# Patient Record
Sex: Male | Born: 1935 | Race: White | Hispanic: No | Marital: Married | Smoking: Former smoker
Health system: Southern US, Community
[De-identification: ages and names within clinical notes are randomized; demographics above are authoritative.]

## PROBLEM LIST (undated history)

## (undated) DIAGNOSIS — H9190 Unspecified hearing loss, unspecified ear: Secondary | ICD-10-CM

## (undated) DIAGNOSIS — I10 Essential (primary) hypertension: Secondary | ICD-10-CM

## (undated) DIAGNOSIS — I2510 Atherosclerotic heart disease of native coronary artery without angina pectoris: Secondary | ICD-10-CM

## (undated) DIAGNOSIS — J45909 Unspecified asthma, uncomplicated: Secondary | ICD-10-CM

## (undated) DIAGNOSIS — M1990 Unspecified osteoarthritis, unspecified site: Secondary | ICD-10-CM

## (undated) DIAGNOSIS — C4490 Unspecified malignant neoplasm of skin, unspecified: Secondary | ICD-10-CM

## (undated) DIAGNOSIS — E7800 Pure hypercholesterolemia, unspecified: Secondary | ICD-10-CM

## (undated) DIAGNOSIS — R079 Chest pain, unspecified: Secondary | ICD-10-CM

## (undated) DIAGNOSIS — R200 Anesthesia of skin: Secondary | ICD-10-CM

## (undated) DIAGNOSIS — Z79818 Long term (current) use of other agents affecting estrogen receptors and estrogen levels: Secondary | ICD-10-CM

## (undated) DIAGNOSIS — C61 Malignant neoplasm of prostate: Secondary | ICD-10-CM

## (undated) DIAGNOSIS — R413 Other amnesia: Secondary | ICD-10-CM

## (undated) DIAGNOSIS — R32 Unspecified urinary incontinence: Secondary | ICD-10-CM

## (undated) DIAGNOSIS — Z923 Personal history of irradiation: Secondary | ICD-10-CM

## (undated) HISTORY — PX: HERNIA REPAIR: SHX51

## (undated) HISTORY — PX: SKIN CANCER EXCISION: SHX779

## (undated) HISTORY — PX: COLONOSCOPY: SHX174

## (undated) HISTORY — DX: Chest pain, unspecified: R07.9

## (undated) HISTORY — PX: PENILE PROSTHESIS IMPLANT: SHX240

## (undated) HISTORY — PX: TONSILLECTOMY: SUR1361

## (undated) HISTORY — PX: PROSTATECTOMY: SHX69

## (undated) HISTORY — PX: THYROID SURGERY: SHX805

## (undated) HISTORY — PX: SHOULDER ARTHROSCOPY W/ ROTATOR CUFF REPAIR: SHX2400

---

## 2004-09-24 ENCOUNTER — Emergency Department (HOSPITAL_COMMUNITY): Admission: EM | Admit: 2004-09-24 | Discharge: 2004-09-25 | Payer: Self-pay | Admitting: Emergency Medicine

## 2006-04-30 ENCOUNTER — Encounter: Admission: RE | Admit: 2006-04-30 | Discharge: 2006-04-30 | Payer: Self-pay | Admitting: Family Medicine

## 2006-08-10 ENCOUNTER — Emergency Department (HOSPITAL_COMMUNITY): Admission: EM | Admit: 2006-08-10 | Discharge: 2006-08-10 | Payer: Self-pay | Admitting: Emergency Medicine

## 2007-03-17 ENCOUNTER — Ambulatory Visit (HOSPITAL_BASED_OUTPATIENT_CLINIC_OR_DEPARTMENT_OTHER): Admission: RE | Admit: 2007-03-17 | Discharge: 2007-03-18 | Payer: Self-pay | Admitting: Urology

## 2007-04-27 ENCOUNTER — Emergency Department (HOSPITAL_COMMUNITY): Admission: EM | Admit: 2007-04-27 | Discharge: 2007-04-27 | Payer: Self-pay | Admitting: Emergency Medicine

## 2007-05-15 ENCOUNTER — Emergency Department (HOSPITAL_COMMUNITY): Admission: EM | Admit: 2007-05-15 | Discharge: 2007-05-15 | Payer: Self-pay | Admitting: Emergency Medicine

## 2007-05-17 ENCOUNTER — Encounter: Admission: RE | Admit: 2007-05-17 | Discharge: 2007-05-17 | Payer: Self-pay | Admitting: Orthopedic Surgery

## 2007-08-02 ENCOUNTER — Ambulatory Visit: Payer: Self-pay | Admitting: Internal Medicine

## 2007-08-02 DIAGNOSIS — Z860100 Personal history of colon polyps, unspecified: Secondary | ICD-10-CM | POA: Insufficient documentation

## 2007-08-02 DIAGNOSIS — R159 Full incontinence of feces: Secondary | ICD-10-CM | POA: Insufficient documentation

## 2007-08-02 DIAGNOSIS — K644 Residual hemorrhoidal skin tags: Secondary | ICD-10-CM | POA: Insufficient documentation

## 2007-08-03 ENCOUNTER — Ambulatory Visit: Payer: Self-pay | Admitting: Internal Medicine

## 2007-08-03 ENCOUNTER — Telehealth: Payer: Self-pay | Admitting: Internal Medicine

## 2007-08-04 ENCOUNTER — Telehealth: Payer: Self-pay | Admitting: Internal Medicine

## 2007-08-06 ENCOUNTER — Ambulatory Visit: Payer: Self-pay | Admitting: Internal Medicine

## 2007-08-06 ENCOUNTER — Encounter: Payer: Self-pay | Admitting: Internal Medicine

## 2007-08-09 ENCOUNTER — Encounter: Payer: Self-pay | Admitting: Internal Medicine

## 2008-08-25 ENCOUNTER — Ambulatory Visit: Payer: Self-pay | Admitting: Cardiology

## 2008-08-25 ENCOUNTER — Inpatient Hospital Stay (HOSPITAL_COMMUNITY): Admission: EM | Admit: 2008-08-25 | Discharge: 2008-08-26 | Payer: Self-pay | Admitting: Emergency Medicine

## 2008-08-25 ENCOUNTER — Encounter (INDEPENDENT_AMBULATORY_CARE_PROVIDER_SITE_OTHER): Payer: Self-pay | Admitting: Internal Medicine

## 2008-11-17 ENCOUNTER — Encounter: Admission: RE | Admit: 2008-11-17 | Discharge: 2008-11-17 | Payer: Self-pay | Admitting: Family Medicine

## 2009-01-05 ENCOUNTER — Encounter: Admission: RE | Admit: 2009-01-05 | Discharge: 2009-01-05 | Payer: Self-pay | Admitting: Neurology

## 2010-06-02 LAB — HEMOGLOBIN A1C: Hgb A1c MFr Bld: 5.4 % (ref 4.6–6.1)

## 2010-06-02 LAB — LIPID PANEL
LDL Cholesterol: 40 mg/dL (ref 0–99)
Total CHOL/HDL Ratio: 4 RATIO
Triglycerides: 284 mg/dL — ABNORMAL HIGH (ref <150)
VLDL: 57 mg/dL — ABNORMAL HIGH (ref 0–40)

## 2010-06-02 LAB — COMPREHENSIVE METABOLIC PANEL
ALT: 21 U/L (ref 0–53)
AST: 26 U/L (ref 0–37)
Albumin: 3.6 g/dL (ref 3.5–5.2)
CO2: 26 mEq/L (ref 19–32)
Chloride: 106 mEq/L (ref 96–112)
GFR calc Af Amer: >60 mL/min (ref >60)
GFR calc non Af Amer: 58 mL/min — ABNORMAL LOW (ref >60)
Sodium: 139 mEq/L (ref 135–145)
Total Bilirubin: 0.8 mg/dL (ref 0.3–1.2)

## 2010-06-02 LAB — CBC
HCT: 35.9 % — ABNORMAL LOW (ref 39.0–52.0)
HCT: 38.6 % — ABNORMAL LOW (ref 39.0–52.0)
Hemoglobin: 12.3 g/dL — ABNORMAL LOW (ref 13.0–17.0)
MCHC: 33.8 g/dL (ref 30.0–36.0)
MCV: 92.6 fL (ref 78.0–100.0)
Platelets: 218 10*3/uL (ref 150–400)
RBC: 3.88 MIL/uL — ABNORMAL LOW (ref 4.22–5.81)
RDW: 13.4 % (ref 11.5–15.5)
WBC: 5.4 10*3/uL (ref 4.0–10.5)

## 2010-06-02 LAB — DIFFERENTIAL
Basophils Absolute: 0 10*3/uL (ref 0.0–0.1)
Basophils Absolute: 0.1 10*3/uL (ref 0.0–0.1)
Basophils Relative: 1 % (ref 0–1)
Eosinophils Absolute: 0.4 10*3/uL (ref 0.0–0.7)
Eosinophils Absolute: 0.4 10*3/uL (ref 0.0–0.7)
Eosinophils Relative: 6 % — ABNORMAL HIGH (ref 0–5)
Eosinophils Relative: 7 % — ABNORMAL HIGH (ref 0–5)
Lymphocytes Relative: 26 % (ref 12–46)
Lymphocytes Relative: 27 % (ref 12–46)
Lymphs Abs: 1.4 10*3/uL (ref 0.7–4.0)
Monocytes Absolute: 0.6 10*3/uL (ref 0.1–1.0)

## 2010-06-02 LAB — CK TOTAL AND CKMB (NOT AT ARMC): Relative Index: 1.9 (ref 0.0–2.5)

## 2010-06-02 LAB — POCT I-STAT, CHEM 8
BUN: 27 mg/dL — ABNORMAL HIGH (ref 6–23)
Calcium, Ion: 1.17 mmol/L (ref 1.12–1.32)
Creatinine, Ser: 1.2 mg/dL (ref 0.4–1.5)
Glucose, Bld: 98 mg/dL (ref 70–99)
TCO2: 25 mmol/L (ref 0–100)

## 2010-06-02 LAB — D-DIMER, QUANTITATIVE: D-Dimer, Quant: 0.4 ug/mL-FEU (ref 0.00–0.48)

## 2010-06-02 LAB — CARDIAC PANEL(CRET KIN+CKTOT+MB+TROPI)
CK, MB: 3 ng/mL (ref 0.3–4.0)
Relative Index: 1.9 (ref 0.0–2.5)

## 2010-06-02 LAB — APTT: aPTT: 34 seconds (ref 24–37)

## 2010-06-02 LAB — POCT CARDIAC MARKERS
Troponin i, poc: <0.05 ng/mL (ref 0.00–0.09)
Troponin i, poc: <0.05 ng/mL (ref 0.00–0.09)

## 2010-06-02 LAB — TROPONIN I: Troponin I: 0.02 ng/mL (ref 0.00–0.06)

## 2010-07-09 NOTE — H&P (Signed)
NAME:  Andre Casey, Andre Casey          ACCOUNT NO.:  [id number]   MEDICAL RECORD NO.:  [id number]          PATIENT TYPE:  EMS   LOCATION:  ED                           FACILITY[facility]  [facility]   PHYSICIAN:  Michiel Cowboy, MDDATE OF BIRTH:  August 08, 1929   DATE OF ADMISSION:  08/25/2008  DATE OF DISCHARGE:                              HISTORY & PHYSICAL   PRIMARY CARE Damaya Channing:  Donia Guiles, M.D.   CHIEF COMPLAINT:  Chest pain.   HISTORY OF PRESENT ILLNESS:  The patient is a 77 year old gentleman with  history of hypertension, hyperlipidemia who has had intermittent chest  pains for years but today he was sitting at his computer working around  9:00 p.m. when he developed a sharp substernal chest pain, nonradiating,  not associated shortness of breath, diaphoresis or nausea.  This lasted  for about 115 minutes and went away.  He would not worry too much about  it since he had similar pains before though they did not last that long.  The patient's wife was concerned, brought him into emergency department.  EKG showed sinus bradycardia, left anterior fascicular block.  I  unfortunately did not have an old EKG on him.  [facility] hospitalist was  called to admit for chest pain rule out.   REVIEW OF SYSTEMS:  Otherwise, unremarkable.  The patient exercises and  gardens actively.  Never has shortness of breath or chest pain with  that.  He had a stress test done 4 years ago which was unremarkable for  him and overall has been doing fairly well.  Review of systems, otherwise negative.   PAST MEDICAL HISTORY:  1. Hyperlipidemia.  2. Hypertension.  3. Prostate cancer status post radiation.  4. Hormonal therapy.  5. Chronic bradycardia.   MEDICATIONS:  1. Lisinopril/hydrochlorothiazide, dose unknown.  2. Amlodipine 2.5 mg daily.  3. Simvastatin 20 mg daily.  4. Zetia 10 mg daily.  5. Oxybutynin 10 mg daily.  6. Citalopram 20 mg daily.  7. Multivitamin.  8. Calcium.  9. Glucosamine.  10.Potassium 90 mg daily.  11.Aspirin 81 mg daily.  12.Fish oil daily.  13.Folic acid daily.  14.Biotin daily.  15.Zinc sulfite.  16.Quinine sulfate as needed.   ALLERGIES:  NO KNOWN DRUG ALLERGIES.   PHYSICAL EXAMINATION:  VITALS:  Temperature 97.2, blood pressure 96/61  now up to 107/60, pulses 52, respirations 18, satting 98% on room air.  GENERAL:  The patient appears to be in no acute distress.  HEAD:  Nontraumatic.  Moist mucous membranes.  LUNGS:  Clear to auscultation bilaterally.  HEART:  Regular rate and rhythm.  Somewhat slow though.  ABDOMEN:  Soft, nontender, nondistended.  LOWER EXTREMITIES:  Without clubbing, cyanosis or edema.  NEUROLOGIC:  Intact.  SKIN:  Dry and intact.   LABORATORY DATA:  White blood cell count 7.1, hemoglobin 11.9, sodium  140, potassium 3.4, creatinine 1.2.  Cardiac enzymes negative.  EKG  shows sinus brady with the left anterior fascicular block.  No old EKG  available.  Chest x-ray unremarkable.   ASSESSMENT/PLAN:  1. This is a 77 year old gentleman with past medical history  significant for prostate cancer, hypertension, hyperlipidemia      presents with typical chest pain.  2. Chest pain very typical, but given advanced age and risk factors,      we will cycle cardiac enzymes, admit, place on tele.  Relay for      risk factors, check fasting lipid panel, hemoglobin A1c, given      bradycardia.  Will check TSH.  Given left anterior fascicular      block, will check a 2-D echo to evaluate for any valvular      abnormalities or any wall motion abnormalities.  The patient may      need benefit from repeat stress testing as an outpatient.  The      patient does not describe any pleuritic component or shortness of      breath, but given history of cancer, and somewhat soft and low      blood pressure, to be complete will order a D-dimer, although  PE      is less likely.  3. Hypertension.  Currently under excellent control, actually  somewhat      low, will hold BP medications for now until he is further observed.  4. Hyperlipidemia.  Continue home medications.  5. Slightly low potassium, replace.  6. Prophylaxis Protonix plus Lovenox.      Michiel Cowboy, MD  Electronically Signed     AVD/MEDQ  D:  08/25/2008  T:  08/25/2008  Job:  [id number]   cc:   Donia Guiles, M.D.  Fax: [phone]

## 2010-07-09 NOTE — Op Note (Signed)
NAME:  GARL, SPEIGNER          ACCOUNT NO.:  [id number]   MEDICAL RECORD NO.:  [id number]          PATIENT TYPE:  AMB   LOCATION:  [facility]                         FACILITY:  [facility]   PHYSICIAN:  Ronald L. Earlene Plater, M.D.  DATE OF BIRTH:  1929-07-05   DATE OF PROCEDURE:  03/17/2007  DATE OF DISCHARGE:                               OPERATIVE REPORT   PREOPERATIVE DIAGNOSIS:  Malfunction of previously placed penile  prosthesis.   POSTOPERATIVE DIAGNOSIS:    DICTATION ENDED AT THIS POINT      Duane Boston, MD      Lucrezia Starch. Earlene Plater, M.D.  Electronically Signed    BP/MEDQ  D:  03/17/2007  T:  03/17/2007  Job:  [id number]

## 2010-07-09 NOTE — Op Note (Signed)
NAME:  Andre Casey, Andre Casey          ACCOUNT NO.:  [id number]   MEDICAL RECORD NO.:  [id number]          PATIENT TYPE:  AMB   LOCATION:  [facility]                         FACILITY:  [facility]   PHYSICIAN:  Ronald L. Earlene Plater, M.D.  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   PROCEDURE PERFORMED:  Removal of Mentor Mark II inflatable penile  prosthesis, replacement with Mentor OTR inflatable penile prosthesis.  Urethral dilatation with filiform dilators.   ANESTHESIA:  General.   DRAINS:  14 French Foley catheter.   INDICATIONS FOR PROCEDURE:  The patient is a 77 year old male with  history of cancer of the prostate, status post retropubic prostatectomy  in September, 1992 and subsequent radiotherapy.  He has a history of  erectile dysfunction and had Mark II Mentor inflatable penile prosthesis  placement many years ago in [place].  However, recently he had  some issues with the prosthesis and was seen by Dr. Earlene Plater in the clinic  at which point it was found that there was malfunction with the device.  The patient was scheduled for surgery today which involves removal of  the old penile prosthesis and placement of a Mentor OTR inflatable  penile prosthesis.   DESCRIPTION OF PROCEDURE:  The patient was brought to the operating  room.  The patient was placed in the supine position.  The patient was  administered general anesthesia by the anesthesia team.  Proper time-out  was performed.  The patient was given appropriate preoperative  antibiotic.  The patient was subsequently placed in the dorsal lithotomy  position.  The patient's surgery site was then shaved using a clipper.  The patient's  scrotum and infraumbilical abdomen was shaved using the  clipper.  The patient subsequently underwent a ten minute scrub.  The  scrub area involved infraumbilical, scrotum and perineal area.  The  patient was subsequently painted and draped in the usual sterile manner.  We then attempted to place a 16 [place] regular Foley catheter in the  patient's bladder, however experienced some resistance right around the  bladder area.  We attempted to place a smaller diameter catheter; this  time we tried to place a 14 French Foley catheter, however had some  difficulty with that also.  We subsequently used filiform dilators to  dilate the patient from 70 to 96 [place].  Subsequently we were able to  place a 14 [place] regular Foley catheter with efflux of clear urine.  We  then made a longitudinal hemiscrotal incision along the median raphe of  the scrotum.  We dissected down to the subcutaneous tissue to the area  overlying the pump.  We were able to isolate the tubes going from the  pump to the penile prosthesis.  They were isolate using the Allis tissue  and using the Bovie cautery the subcutaneous tissue was excised.  We  were  subsequently able to retrieve the pump from the pseudocapsule.  We  then followed the tubes on both sides down to the penile prosthesis on  both sides, again using the Bovie electrode we incised the subcutaneous  tissue and capsule covering the proximal part of the penile prosthesis.  After making the small openings over the penile prosthesis we were able  to remove the prosthesis and rear tip extenders on both sides.  Once the  old inflatable penile prosthesis and its components were removed, we  irrigated the scrotum and the carpal bodies with irrigant solution.  We  subsequently measured the old dilations into the carpal bodies and they  were found to be measuring 7 cm proximally bilaterally and 12 cm  distally bilaterally.  Decision was made to use size 16 Mentor ODR  with  a 3 cm rear tip extenders bilaterally.  At that point we paid attention  to the infraumbilical area and made a 4 cm longitudinal incision in the  midline in the infraumbilical area.  This was dissected down to the  rectus fascia.  A small opening was made in the rectus fascia and with  blunt dissection space was created on the right side along the space of  Retzius.  After securing hemostasis the 60 cc reservoir was placed into  the space of Retzius.  The tubing was brought out and the rectus fascia  was repaired with 3-0 PDS suture.  We then again paid attention to the  scrotal area.  At this point we placed the new prosthesis bilaterally  into the carpal bodies using the sutures at the tip of the prosthesis  introducer.  They were bilaterally fitted with 3 cm rear tip extenders  and the prosthesis was placed distally and proximally.  Of note, after  removal of the old prosthesis, we placed 3-0 PDS sutures as stay sutures  into the openings over the carpal bodies.  We then paid attention to the  placement of the pump into the scrotum.  The patient already had a  pseudocapsule from the old pump and the pump was placed into the old  capsule.  Of note, after placement of the penile prosthesis and  placement of the pump, we had checked the whole system after connecting  the pump to a 60 cc syringe and it seemed to be working fine.  The pump  and the release valve were secured in place by suturing the layer of  scrotum with the pseudocapsule so that the release valve lies  horizontally.  We then closed the carpal openings using 3-0 PDS suture  and the stay sutures bilaterally.  A subcutaneous tunnel was then  created between the scrotum and the right side infraumbilically and the  tubing from the pump was taken up into the infraumbilical incision and  connection was made with the reservoir.  We again tested the system and  it was functioning fine.  At that point we closed the dartos  muscle and  fascia with 3-0 chromic suture, closing the dead space.  We also closed  the subcutaneous tissue with the infraumbilical incision using 3-0  chromic suture.  We then used staples and closed the infraumbilical  incision.  We then used 4-0 nylon suture and applied subcuticular stitch  across the scrotal incision.  We then applied collodium over the scrotal  incision and tied the nylon suture loosely.  The incision was then  dressed with 4x4 gauze and Kerlix fluff ball.  The catheter  was then  connected to a drainage bag and put to straight drain after securing the  penis against the infraumbilical abdomen.  The patient was subsequently  transferred in stable condition to the recovery room.  Of note, Dr.  Earlene Plater was present during all aspects of the case.     ______________________________  Orvilla Cornwall, Urology Resident      Lucrezia Starch. Earlene Plater, M.D.  Electronically Signed    JJ/MEDQ  D:  03/17/2007  T:  03/17/2007  Job:  [id number]

## 2010-11-14 LAB — COMPREHENSIVE METABOLIC PANEL
Albumin: 3.8
Alkaline Phosphatase: 57
BUN: 18
Chloride: 102
Glucose, Bld: 116 — ABNORMAL HIGH
Potassium: 3.6
Total Bilirubin: 1.2

## 2010-11-14 LAB — CBC
HCT: 40.7
Hemoglobin: 14.2
WBC: 8.3

## 2010-11-14 LAB — PROTIME-INR
INR: 0.9
Prothrombin Time: 12.6

## 2010-11-18 LAB — URINALYSIS, ROUTINE W REFLEX MICROSCOPIC
Bilirubin Urine: NEGATIVE
Glucose, UA: NEGATIVE
Hgb urine dipstick: NEGATIVE
Specific Gravity, Urine: 1.015
Urobilinogen, UA: 0.2

## 2010-11-18 LAB — URINE CULTURE: Culture: NO GROWTH

## 2011-01-31 DIAGNOSIS — N393 Stress incontinence (female) (male): Secondary | ICD-10-CM | POA: Insufficient documentation

## 2011-02-03 DIAGNOSIS — N529 Male erectile dysfunction, unspecified: Secondary | ICD-10-CM | POA: Insufficient documentation

## 2011-02-27 DIAGNOSIS — I498 Other specified cardiac arrhythmias: Secondary | ICD-10-CM | POA: Diagnosis not present

## 2011-03-24 DIAGNOSIS — E7800 Pure hypercholesterolemia, unspecified: Secondary | ICD-10-CM | POA: Diagnosis not present

## 2011-03-24 DIAGNOSIS — I10 Essential (primary) hypertension: Secondary | ICD-10-CM | POA: Diagnosis not present

## 2011-03-24 DIAGNOSIS — N393 Stress incontinence (female) (male): Secondary | ICD-10-CM | POA: Diagnosis not present

## 2011-03-24 DIAGNOSIS — Z8546 Personal history of malignant neoplasm of prostate: Secondary | ICD-10-CM | POA: Diagnosis not present

## 2011-03-24 DIAGNOSIS — N529 Male erectile dysfunction, unspecified: Secondary | ICD-10-CM | POA: Diagnosis not present

## 2011-04-15 DIAGNOSIS — I10 Essential (primary) hypertension: Secondary | ICD-10-CM | POA: Diagnosis not present

## 2011-04-15 DIAGNOSIS — R413 Other amnesia: Secondary | ICD-10-CM | POA: Diagnosis not present

## 2011-04-15 DIAGNOSIS — R32 Unspecified urinary incontinence: Secondary | ICD-10-CM | POA: Diagnosis not present

## 2011-04-15 DIAGNOSIS — E782 Mixed hyperlipidemia: Secondary | ICD-10-CM | POA: Diagnosis not present

## 2011-04-15 DIAGNOSIS — R5381 Other malaise: Secondary | ICD-10-CM | POA: Diagnosis not present

## 2011-04-15 DIAGNOSIS — R5383 Other fatigue: Secondary | ICD-10-CM | POA: Diagnosis not present

## 2011-04-15 DIAGNOSIS — C61 Malignant neoplasm of prostate: Secondary | ICD-10-CM | POA: Diagnosis not present

## 2011-05-02 DIAGNOSIS — N189 Chronic kidney disease, unspecified: Secondary | ICD-10-CM | POA: Diagnosis not present

## 2011-05-07 DIAGNOSIS — M25519 Pain in unspecified shoulder: Secondary | ICD-10-CM | POA: Diagnosis not present

## 2011-05-07 DIAGNOSIS — S43429A Sprain of unspecified rotator cuff capsule, initial encounter: Secondary | ICD-10-CM | POA: Diagnosis not present

## 2011-05-20 DIAGNOSIS — H02839 Dermatochalasis of unspecified eye, unspecified eyelid: Secondary | ICD-10-CM | POA: Diagnosis not present

## 2011-07-19 DIAGNOSIS — M25519 Pain in unspecified shoulder: Secondary | ICD-10-CM | POA: Diagnosis not present

## 2011-07-24 DIAGNOSIS — S43429A Sprain of unspecified rotator cuff capsule, initial encounter: Secondary | ICD-10-CM | POA: Diagnosis not present

## 2011-08-04 DIAGNOSIS — C61 Malignant neoplasm of prostate: Secondary | ICD-10-CM | POA: Diagnosis not present

## 2011-08-04 DIAGNOSIS — N529 Male erectile dysfunction, unspecified: Secondary | ICD-10-CM | POA: Diagnosis not present

## 2011-08-04 DIAGNOSIS — Z8546 Personal history of malignant neoplasm of prostate: Secondary | ICD-10-CM | POA: Insufficient documentation

## 2011-08-04 DIAGNOSIS — N393 Stress incontinence (female) (male): Secondary | ICD-10-CM | POA: Diagnosis not present

## 2011-08-21 DIAGNOSIS — N529 Male erectile dysfunction, unspecified: Secondary | ICD-10-CM | POA: Diagnosis not present

## 2011-08-21 DIAGNOSIS — Z8546 Personal history of malignant neoplasm of prostate: Secondary | ICD-10-CM | POA: Diagnosis not present

## 2011-08-21 DIAGNOSIS — N393 Stress incontinence (female) (male): Secondary | ICD-10-CM | POA: Diagnosis not present

## 2011-09-04 DIAGNOSIS — S0180XA Unspecified open wound of other part of head, initial encounter: Secondary | ICD-10-CM | POA: Diagnosis not present

## 2011-09-23 DIAGNOSIS — D239 Other benign neoplasm of skin, unspecified: Secondary | ICD-10-CM | POA: Diagnosis not present

## 2011-09-23 DIAGNOSIS — L570 Actinic keratosis: Secondary | ICD-10-CM | POA: Diagnosis not present

## 2011-10-14 DIAGNOSIS — C61 Malignant neoplasm of prostate: Secondary | ICD-10-CM | POA: Diagnosis not present

## 2011-10-14 DIAGNOSIS — Z01818 Encounter for other preprocedural examination: Secondary | ICD-10-CM | POA: Diagnosis not present

## 2011-10-14 DIAGNOSIS — I10 Essential (primary) hypertension: Secondary | ICD-10-CM | POA: Diagnosis not present

## 2011-10-14 DIAGNOSIS — R413 Other amnesia: Secondary | ICD-10-CM | POA: Diagnosis not present

## 2011-10-14 DIAGNOSIS — E782 Mixed hyperlipidemia: Secondary | ICD-10-CM | POA: Diagnosis not present

## 2011-10-14 DIAGNOSIS — R32 Unspecified urinary incontinence: Secondary | ICD-10-CM | POA: Diagnosis not present

## 2011-10-14 DIAGNOSIS — R5381 Other malaise: Secondary | ICD-10-CM | POA: Diagnosis not present

## 2011-12-01 DIAGNOSIS — M898X9 Other specified disorders of bone, unspecified site: Secondary | ICD-10-CM | POA: Diagnosis not present

## 2011-12-01 DIAGNOSIS — M75100 Unspecified rotator cuff tear or rupture of unspecified shoulder, not specified as traumatic: Secondary | ICD-10-CM | POA: Diagnosis not present

## 2011-12-01 DIAGNOSIS — M659 Synovitis and tenosynovitis, unspecified: Secondary | ICD-10-CM | POA: Diagnosis not present

## 2011-12-01 DIAGNOSIS — M75110 Incomplete rotator cuff tear or rupture of unspecified shoulder, not specified as traumatic: Secondary | ICD-10-CM | POA: Diagnosis not present

## 2011-12-01 DIAGNOSIS — G8918 Other acute postprocedural pain: Secondary | ICD-10-CM | POA: Diagnosis not present

## 2011-12-01 DIAGNOSIS — M9420 Chondromalacia, unspecified site: Secondary | ICD-10-CM | POA: Diagnosis not present

## 2011-12-01 DIAGNOSIS — M24119 Other articular cartilage disorders, unspecified shoulder: Secondary | ICD-10-CM | POA: Diagnosis not present

## 2011-12-01 DIAGNOSIS — M25519 Pain in unspecified shoulder: Secondary | ICD-10-CM | POA: Diagnosis not present

## 2011-12-01 DIAGNOSIS — M12269 Villonodular synovitis (pigmented), unspecified knee: Secondary | ICD-10-CM | POA: Diagnosis not present

## 2011-12-04 DIAGNOSIS — S43429A Sprain of unspecified rotator cuff capsule, initial encounter: Secondary | ICD-10-CM | POA: Diagnosis not present

## 2011-12-05 DIAGNOSIS — H35379 Puckering of macula, unspecified eye: Secondary | ICD-10-CM | POA: Diagnosis not present

## 2011-12-05 DIAGNOSIS — Z961 Presence of intraocular lens: Secondary | ICD-10-CM | POA: Diagnosis not present

## 2011-12-05 DIAGNOSIS — H5200 Hypermetropia, unspecified eye: Secondary | ICD-10-CM | POA: Diagnosis not present

## 2011-12-05 DIAGNOSIS — H524 Presbyopia: Secondary | ICD-10-CM | POA: Diagnosis not present

## 2011-12-08 DIAGNOSIS — S43429A Sprain of unspecified rotator cuff capsule, initial encounter: Secondary | ICD-10-CM | POA: Diagnosis not present

## 2011-12-11 DIAGNOSIS — S43429A Sprain of unspecified rotator cuff capsule, initial encounter: Secondary | ICD-10-CM | POA: Diagnosis not present

## 2011-12-15 DIAGNOSIS — S43429A Sprain of unspecified rotator cuff capsule, initial encounter: Secondary | ICD-10-CM | POA: Diagnosis not present

## 2011-12-18 DIAGNOSIS — S43429A Sprain of unspecified rotator cuff capsule, initial encounter: Secondary | ICD-10-CM | POA: Diagnosis not present

## 2011-12-22 DIAGNOSIS — S43429A Sprain of unspecified rotator cuff capsule, initial encounter: Secondary | ICD-10-CM | POA: Diagnosis not present

## 2011-12-25 DIAGNOSIS — S43429A Sprain of unspecified rotator cuff capsule, initial encounter: Secondary | ICD-10-CM | POA: Diagnosis not present

## 2011-12-30 DIAGNOSIS — S43429A Sprain of unspecified rotator cuff capsule, initial encounter: Secondary | ICD-10-CM | POA: Diagnosis not present

## 2012-01-01 DIAGNOSIS — S43429A Sprain of unspecified rotator cuff capsule, initial encounter: Secondary | ICD-10-CM | POA: Diagnosis not present

## 2012-01-05 DIAGNOSIS — C61 Malignant neoplasm of prostate: Secondary | ICD-10-CM | POA: Diagnosis not present

## 2012-01-05 DIAGNOSIS — N390 Urinary tract infection, site not specified: Secondary | ICD-10-CM | POA: Diagnosis not present

## 2012-01-05 DIAGNOSIS — R300 Dysuria: Secondary | ICD-10-CM | POA: Insufficient documentation

## 2012-01-05 DIAGNOSIS — N393 Stress incontinence (female) (male): Secondary | ICD-10-CM | POA: Diagnosis not present

## 2012-01-05 DIAGNOSIS — N529 Male erectile dysfunction, unspecified: Secondary | ICD-10-CM | POA: Diagnosis not present

## 2012-01-06 DIAGNOSIS — S43429A Sprain of unspecified rotator cuff capsule, initial encounter: Secondary | ICD-10-CM | POA: Diagnosis not present

## 2012-01-08 DIAGNOSIS — S43429A Sprain of unspecified rotator cuff capsule, initial encounter: Secondary | ICD-10-CM | POA: Diagnosis not present

## 2012-01-12 DIAGNOSIS — S43429A Sprain of unspecified rotator cuff capsule, initial encounter: Secondary | ICD-10-CM | POA: Diagnosis not present

## 2012-01-15 DIAGNOSIS — S43429A Sprain of unspecified rotator cuff capsule, initial encounter: Secondary | ICD-10-CM | POA: Diagnosis not present

## 2012-01-26 DIAGNOSIS — S43429A Sprain of unspecified rotator cuff capsule, initial encounter: Secondary | ICD-10-CM | POA: Diagnosis not present

## 2012-01-29 DIAGNOSIS — S43429A Sprain of unspecified rotator cuff capsule, initial encounter: Secondary | ICD-10-CM | POA: Diagnosis not present

## 2012-02-02 DIAGNOSIS — S43429A Sprain of unspecified rotator cuff capsule, initial encounter: Secondary | ICD-10-CM | POA: Diagnosis not present

## 2012-02-04 DIAGNOSIS — Z23 Encounter for immunization: Secondary | ICD-10-CM | POA: Diagnosis not present

## 2012-02-05 DIAGNOSIS — S43429A Sprain of unspecified rotator cuff capsule, initial encounter: Secondary | ICD-10-CM | POA: Diagnosis not present

## 2012-02-09 DIAGNOSIS — S43429A Sprain of unspecified rotator cuff capsule, initial encounter: Secondary | ICD-10-CM | POA: Diagnosis not present

## 2012-02-09 DIAGNOSIS — C61 Malignant neoplasm of prostate: Secondary | ICD-10-CM | POA: Diagnosis not present

## 2012-02-09 DIAGNOSIS — N393 Stress incontinence (female) (male): Secondary | ICD-10-CM | POA: Diagnosis not present

## 2012-02-09 DIAGNOSIS — N529 Male erectile dysfunction, unspecified: Secondary | ICD-10-CM | POA: Diagnosis not present

## 2012-02-16 DIAGNOSIS — S43429A Sprain of unspecified rotator cuff capsule, initial encounter: Secondary | ICD-10-CM | POA: Diagnosis not present

## 2012-02-27 DIAGNOSIS — J40 Bronchitis, not specified as acute or chronic: Secondary | ICD-10-CM | POA: Diagnosis not present

## 2012-04-22 DIAGNOSIS — R413 Other amnesia: Secondary | ICD-10-CM | POA: Diagnosis not present

## 2012-04-22 DIAGNOSIS — E782 Mixed hyperlipidemia: Secondary | ICD-10-CM | POA: Diagnosis not present

## 2012-04-22 DIAGNOSIS — I10 Essential (primary) hypertension: Secondary | ICD-10-CM | POA: Diagnosis not present

## 2012-05-20 DIAGNOSIS — C61 Malignant neoplasm of prostate: Secondary | ICD-10-CM | POA: Diagnosis not present

## 2012-05-20 DIAGNOSIS — N529 Male erectile dysfunction, unspecified: Secondary | ICD-10-CM | POA: Diagnosis not present

## 2012-05-20 DIAGNOSIS — N393 Stress incontinence (female) (male): Secondary | ICD-10-CM | POA: Diagnosis not present

## 2012-06-08 DIAGNOSIS — M4310 Spondylolisthesis, site unspecified: Secondary | ICD-10-CM | POA: Diagnosis not present

## 2012-06-08 DIAGNOSIS — M545 Low back pain, unspecified: Secondary | ICD-10-CM | POA: Diagnosis not present

## 2012-06-08 DIAGNOSIS — M4120 Other idiopathic scoliosis, site unspecified: Secondary | ICD-10-CM | POA: Diagnosis not present

## 2012-06-08 DIAGNOSIS — M5137 Other intervertebral disc degeneration, lumbosacral region: Secondary | ICD-10-CM | POA: Diagnosis not present

## 2012-06-28 DIAGNOSIS — M5137 Other intervertebral disc degeneration, lumbosacral region: Secondary | ICD-10-CM | POA: Diagnosis not present

## 2012-07-01 DIAGNOSIS — M5137 Other intervertebral disc degeneration, lumbosacral region: Secondary | ICD-10-CM | POA: Diagnosis not present

## 2012-07-06 DIAGNOSIS — M5137 Other intervertebral disc degeneration, lumbosacral region: Secondary | ICD-10-CM | POA: Diagnosis not present

## 2012-07-28 DIAGNOSIS — E782 Mixed hyperlipidemia: Secondary | ICD-10-CM | POA: Diagnosis not present

## 2012-10-28 DIAGNOSIS — I10 Essential (primary) hypertension: Secondary | ICD-10-CM | POA: Diagnosis not present

## 2012-10-28 DIAGNOSIS — E782 Mixed hyperlipidemia: Secondary | ICD-10-CM | POA: Diagnosis not present

## 2012-10-28 DIAGNOSIS — C61 Malignant neoplasm of prostate: Secondary | ICD-10-CM | POA: Diagnosis not present

## 2012-10-28 DIAGNOSIS — R413 Other amnesia: Secondary | ICD-10-CM | POA: Diagnosis not present

## 2012-11-24 DIAGNOSIS — Z23 Encounter for immunization: Secondary | ICD-10-CM | POA: Diagnosis not present

## 2012-12-13 DIAGNOSIS — Z961 Presence of intraocular lens: Secondary | ICD-10-CM | POA: Diagnosis not present

## 2012-12-13 DIAGNOSIS — H35379 Puckering of macula, unspecified eye: Secondary | ICD-10-CM | POA: Diagnosis not present

## 2012-12-13 DIAGNOSIS — H04129 Dry eye syndrome of unspecified lacrimal gland: Secondary | ICD-10-CM | POA: Diagnosis not present

## 2012-12-13 DIAGNOSIS — H5200 Hypermetropia, unspecified eye: Secondary | ICD-10-CM | POA: Diagnosis not present

## 2013-01-24 DIAGNOSIS — N393 Stress incontinence (female) (male): Secondary | ICD-10-CM | POA: Diagnosis not present

## 2013-01-24 DIAGNOSIS — N529 Male erectile dysfunction, unspecified: Secondary | ICD-10-CM | POA: Diagnosis not present

## 2013-01-24 DIAGNOSIS — C61 Malignant neoplasm of prostate: Secondary | ICD-10-CM | POA: Diagnosis not present

## 2013-01-25 DIAGNOSIS — M47817 Spondylosis without myelopathy or radiculopathy, lumbosacral region: Secondary | ICD-10-CM | POA: Diagnosis not present

## 2013-01-28 DIAGNOSIS — E782 Mixed hyperlipidemia: Secondary | ICD-10-CM | POA: Diagnosis not present

## 2013-01-28 DIAGNOSIS — Z79899 Other long term (current) drug therapy: Secondary | ICD-10-CM | POA: Diagnosis not present

## 2013-02-10 DIAGNOSIS — R159 Full incontinence of feces: Secondary | ICD-10-CM | POA: Diagnosis not present

## 2013-02-10 DIAGNOSIS — K219 Gastro-esophageal reflux disease without esophagitis: Secondary | ICD-10-CM | POA: Diagnosis not present

## 2013-03-08 DIAGNOSIS — K219 Gastro-esophageal reflux disease without esophagitis: Secondary | ICD-10-CM | POA: Diagnosis not present

## 2013-03-08 DIAGNOSIS — R159 Full incontinence of feces: Secondary | ICD-10-CM | POA: Diagnosis not present

## 2013-04-22 DIAGNOSIS — K219 Gastro-esophageal reflux disease without esophagitis: Secondary | ICD-10-CM | POA: Diagnosis not present

## 2013-04-22 DIAGNOSIS — R159 Full incontinence of feces: Secondary | ICD-10-CM | POA: Diagnosis not present

## 2013-04-22 DIAGNOSIS — Z86010 Personal history of colonic polyps: Secondary | ICD-10-CM | POA: Diagnosis not present

## 2013-04-22 DIAGNOSIS — K6289 Other specified diseases of anus and rectum: Secondary | ICD-10-CM | POA: Diagnosis not present

## 2013-05-09 DIAGNOSIS — Z09 Encounter for follow-up examination after completed treatment for conditions other than malignant neoplasm: Secondary | ICD-10-CM | POA: Diagnosis not present

## 2013-05-09 DIAGNOSIS — D126 Benign neoplasm of colon, unspecified: Secondary | ICD-10-CM | POA: Diagnosis not present

## 2013-05-09 DIAGNOSIS — Z86010 Personal history of colonic polyps: Secondary | ICD-10-CM | POA: Diagnosis not present

## 2013-05-19 DIAGNOSIS — M76899 Other specified enthesopathies of unspecified lower limb, excluding foot: Secondary | ICD-10-CM | POA: Diagnosis not present

## 2013-05-19 DIAGNOSIS — M25559 Pain in unspecified hip: Secondary | ICD-10-CM | POA: Diagnosis not present

## 2013-07-25 DIAGNOSIS — N35919 Unspecified urethral stricture, male, unspecified site: Secondary | ICD-10-CM | POA: Insufficient documentation

## 2013-07-25 DIAGNOSIS — N393 Stress incontinence (female) (male): Secondary | ICD-10-CM | POA: Diagnosis not present

## 2013-07-25 DIAGNOSIS — IMO0002 Reserved for concepts with insufficient information to code with codable children: Secondary | ICD-10-CM | POA: Diagnosis not present

## 2013-07-25 DIAGNOSIS — C61 Malignant neoplasm of prostate: Secondary | ICD-10-CM | POA: Diagnosis not present

## 2013-07-25 DIAGNOSIS — N529 Male erectile dysfunction, unspecified: Secondary | ICD-10-CM | POA: Diagnosis not present

## 2013-08-02 DIAGNOSIS — Z86010 Personal history of colonic polyps: Secondary | ICD-10-CM | POA: Diagnosis not present

## 2013-08-02 DIAGNOSIS — R159 Full incontinence of feces: Secondary | ICD-10-CM | POA: Diagnosis not present

## 2013-08-02 DIAGNOSIS — K219 Gastro-esophageal reflux disease without esophagitis: Secondary | ICD-10-CM | POA: Diagnosis not present

## 2013-08-03 ENCOUNTER — Other Ambulatory Visit: Payer: Self-pay | Admitting: Dermatology

## 2013-08-03 DIAGNOSIS — D0439 Carcinoma in situ of skin of other parts of face: Secondary | ICD-10-CM | POA: Diagnosis not present

## 2013-08-03 DIAGNOSIS — D0430 Carcinoma in situ of skin of unspecified part of face: Secondary | ICD-10-CM | POA: Diagnosis not present

## 2013-08-03 DIAGNOSIS — C4441 Basal cell carcinoma of skin of scalp and neck: Secondary | ICD-10-CM | POA: Diagnosis not present

## 2013-08-03 DIAGNOSIS — L570 Actinic keratosis: Secondary | ICD-10-CM | POA: Diagnosis not present

## 2013-09-15 DIAGNOSIS — D0439 Carcinoma in situ of skin of other parts of face: Secondary | ICD-10-CM | POA: Diagnosis not present

## 2013-09-15 DIAGNOSIS — D0430 Carcinoma in situ of skin of unspecified part of face: Secondary | ICD-10-CM | POA: Diagnosis not present

## 2013-09-15 DIAGNOSIS — C4441 Basal cell carcinoma of skin of scalp and neck: Secondary | ICD-10-CM | POA: Diagnosis not present

## 2013-10-28 DIAGNOSIS — Z23 Encounter for immunization: Secondary | ICD-10-CM | POA: Diagnosis not present

## 2013-10-28 DIAGNOSIS — R413 Other amnesia: Secondary | ICD-10-CM | POA: Diagnosis not present

## 2013-10-28 DIAGNOSIS — R32 Unspecified urinary incontinence: Secondary | ICD-10-CM | POA: Diagnosis not present

## 2013-10-28 DIAGNOSIS — C61 Malignant neoplasm of prostate: Secondary | ICD-10-CM | POA: Diagnosis not present

## 2013-10-28 DIAGNOSIS — K6289 Other specified diseases of anus and rectum: Secondary | ICD-10-CM | POA: Diagnosis not present

## 2013-10-28 DIAGNOSIS — I10 Essential (primary) hypertension: Secondary | ICD-10-CM | POA: Diagnosis not present

## 2013-10-28 DIAGNOSIS — E782 Mixed hyperlipidemia: Secondary | ICD-10-CM | POA: Diagnosis not present

## 2013-12-15 DIAGNOSIS — H35372 Puckering of macula, left eye: Secondary | ICD-10-CM | POA: Diagnosis not present

## 2013-12-15 DIAGNOSIS — Z961 Presence of intraocular lens: Secondary | ICD-10-CM | POA: Diagnosis not present

## 2014-01-02 DIAGNOSIS — D485 Neoplasm of uncertain behavior of skin: Secondary | ICD-10-CM | POA: Diagnosis not present

## 2014-01-02 DIAGNOSIS — L570 Actinic keratosis: Secondary | ICD-10-CM | POA: Diagnosis not present

## 2014-01-30 DIAGNOSIS — N528 Other male erectile dysfunction: Secondary | ICD-10-CM | POA: Diagnosis not present

## 2014-01-30 DIAGNOSIS — C61 Malignant neoplasm of prostate: Secondary | ICD-10-CM | POA: Diagnosis not present

## 2014-01-30 DIAGNOSIS — Z7982 Long term (current) use of aspirin: Secondary | ICD-10-CM | POA: Diagnosis not present

## 2014-01-30 DIAGNOSIS — N393 Stress incontinence (female) (male): Secondary | ICD-10-CM | POA: Diagnosis not present

## 2014-01-30 DIAGNOSIS — Z87891 Personal history of nicotine dependence: Secondary | ICD-10-CM | POA: Diagnosis not present

## 2014-07-18 DIAGNOSIS — S76311A Strain of muscle, fascia and tendon of the posterior muscle group at thigh level, right thigh, initial encounter: Secondary | ICD-10-CM | POA: Diagnosis not present

## 2014-08-01 DIAGNOSIS — L570 Actinic keratosis: Secondary | ICD-10-CM | POA: Diagnosis not present

## 2014-08-07 DIAGNOSIS — C61 Malignant neoplasm of prostate: Secondary | ICD-10-CM | POA: Diagnosis not present

## 2014-08-07 DIAGNOSIS — Z8546 Personal history of malignant neoplasm of prostate: Secondary | ICD-10-CM | POA: Diagnosis not present

## 2014-08-07 DIAGNOSIS — N393 Stress incontinence (female) (male): Secondary | ICD-10-CM | POA: Diagnosis not present

## 2014-08-07 DIAGNOSIS — N528 Other male erectile dysfunction: Secondary | ICD-10-CM | POA: Diagnosis not present

## 2014-10-31 DIAGNOSIS — R413 Other amnesia: Secondary | ICD-10-CM | POA: Diagnosis not present

## 2014-10-31 DIAGNOSIS — C61 Malignant neoplasm of prostate: Secondary | ICD-10-CM | POA: Diagnosis not present

## 2014-10-31 DIAGNOSIS — Z23 Encounter for immunization: Secondary | ICD-10-CM | POA: Diagnosis not present

## 2014-10-31 DIAGNOSIS — I10 Essential (primary) hypertension: Secondary | ICD-10-CM | POA: Diagnosis not present

## 2014-10-31 DIAGNOSIS — G4700 Insomnia, unspecified: Secondary | ICD-10-CM | POA: Diagnosis not present

## 2014-10-31 DIAGNOSIS — E782 Mixed hyperlipidemia: Secondary | ICD-10-CM | POA: Diagnosis not present

## 2014-10-31 DIAGNOSIS — R32 Unspecified urinary incontinence: Secondary | ICD-10-CM | POA: Diagnosis not present

## 2014-10-31 DIAGNOSIS — R159 Full incontinence of feces: Secondary | ICD-10-CM | POA: Diagnosis not present

## 2014-10-31 DIAGNOSIS — M150 Primary generalized (osteo)arthritis: Secondary | ICD-10-CM | POA: Diagnosis not present

## 2014-10-31 DIAGNOSIS — Z0000 Encounter for general adult medical examination without abnormal findings: Secondary | ICD-10-CM | POA: Diagnosis not present

## 2014-10-31 DIAGNOSIS — K627 Radiation proctitis: Secondary | ICD-10-CM | POA: Diagnosis not present

## 2014-10-31 DIAGNOSIS — K219 Gastro-esophageal reflux disease without esophagitis: Secondary | ICD-10-CM | POA: Diagnosis not present

## 2015-02-09 DIAGNOSIS — N393 Stress incontinence (female) (male): Secondary | ICD-10-CM | POA: Diagnosis not present

## 2015-02-09 DIAGNOSIS — R972 Elevated prostate specific antigen [PSA]: Secondary | ICD-10-CM | POA: Diagnosis not present

## 2015-02-09 DIAGNOSIS — R9721 Rising PSA following treatment for malignant neoplasm of prostate: Secondary | ICD-10-CM | POA: Diagnosis not present

## 2015-02-09 DIAGNOSIS — N529 Male erectile dysfunction, unspecified: Secondary | ICD-10-CM | POA: Diagnosis not present

## 2015-02-09 DIAGNOSIS — C61 Malignant neoplasm of prostate: Secondary | ICD-10-CM | POA: Insufficient documentation

## 2015-02-21 ENCOUNTER — Other Ambulatory Visit: Payer: Self-pay | Admitting: Dermatology

## 2015-02-21 DIAGNOSIS — D485 Neoplasm of uncertain behavior of skin: Secondary | ICD-10-CM | POA: Diagnosis not present

## 2015-02-21 DIAGNOSIS — L570 Actinic keratosis: Secondary | ICD-10-CM | POA: Diagnosis not present

## 2015-03-05 DIAGNOSIS — H35372 Puckering of macula, left eye: Secondary | ICD-10-CM | POA: Diagnosis not present

## 2015-03-05 DIAGNOSIS — Z961 Presence of intraocular lens: Secondary | ICD-10-CM | POA: Diagnosis not present

## 2015-03-07 DIAGNOSIS — M25511 Pain in right shoulder: Secondary | ICD-10-CM | POA: Diagnosis not present

## 2015-03-16 DIAGNOSIS — M25511 Pain in right shoulder: Secondary | ICD-10-CM | POA: Diagnosis not present

## 2015-03-16 DIAGNOSIS — G8929 Other chronic pain: Secondary | ICD-10-CM | POA: Diagnosis not present

## 2015-03-22 DIAGNOSIS — M19011 Primary osteoarthritis, right shoulder: Secondary | ICD-10-CM | POA: Diagnosis not present

## 2015-03-22 DIAGNOSIS — M12811 Other specific arthropathies, not elsewhere classified, right shoulder: Secondary | ICD-10-CM | POA: Diagnosis not present

## 2015-04-11 NOTE — H&P (Signed)
  Andre Casey. is an 82 y.o. male.    Chief Complaint: right shoulder pain  HPI: Pt is a 82 y.o. male complaining of right shoulder pain for multiple years. Pain had continually increased since the beginning. X-rays in the clinic show end-stage arthritic changes of the right shoulder. Pt has tried various conservative treatments which have failed to alleviate their symptoms, including injections and therapy. Various options are discussed with the patient. Risks, benefits and expectations were discussed with the patient. Patient understand the risks, benefits and expectations and wishes to proceed with surgery.   PCP:  No primary care provider on file.  D/C Plans: Home  PMH: No past medical history on file.  PSH: No past surgical history on file.  Social History:  has no tobacco, alcohol, and drug history on file.  Allergies:  Allergies not on file  Medications: No current facility-administered medications for this encounter.   No current outpatient prescriptions on file.    No results found for this or any previous visit (from the past 48 hour(s)). No results found.  ROS: Pain with rom of the right upper extremity  Physical Exam:  Alert and oriented 82 y.o. male in no acute distress Cranial nerves 2-12 intact Cervical spine: full rom with no tenderness, nv intact distally Chest: active breath sounds bilaterally, no wheeze rhonchi or rales Heart: regular rate and rhythm, no murmur Abd: non tender non distended with active bowel sounds Hip is stable with rom  Right shoulder with limited rom nv intact distally Strength limited with ER and IR  No rashes or edema  Assessment/Plan Assessment: right shoulder rotator cuff insufficiency  Plan: Patient will undergo a right reverse total shoulder by Dr. Veverly Fells at [facility] Risks benefits and expectations were discussed with the patient. Patient understand risks, benefits and expectations and wishes to  proceed.

## 2015-04-16 NOTE — Pre-Procedure Instructions (Addendum)
Alden Hipp Jr.  04/16/2015      [facility] - [place], [place] - [address] AT [address] [place] [address] Phone: [phone] Fax: [phone]    Your procedure is scheduled on Friday February 24th.  Report to [facility] Admitting at 830 A.M.  Call this number if you have problems the morning of surgery:  [phone]   Remember:  Do not eat food or drink liquids after midnight.  Take these medicines the morning of surgery with A SIP OF WATER acetaminophen (tylenol), memantine (namenda xr)  STOP: ALL Vitamins, Supplements, Effient and Herbal Medications, Fish Oils, Aspirins, NSAIDs (Nonsteroidal Anti-inflammatories such as Ibuprofen, Aleve, or Advil), and Goody's/BC Powders today until after surgery as directed by your physician. This includes your baby aspirin and diclofenac gel.       Do not wear jewelry, make-up or nail polish.  Do not wear lotions, powders, or perfumes.    Men may shave face and neck.  Do not bring valuables to the hospital.  [facility] is not responsible for any belongings or valuables.  Contacts, dentures or bridgework may not be worn into surgery.  Leave your suitcase in the car.  After surgery it may be brought to your room.  For patients admitted to the hospital, discharge time will be determined by your treatment team.  Patients discharged the day of surgery will not be allowed to drive home.        Preparing for Surgery at [facility]  Before surgery, you can play an important role.  Because skin is not sterile, your skin needs to be as free of germs as possible.  You can reduce the number of germs on your skin by washing with CHG (chlorahexidine gluconate) Soap before surgery.  CHG is an antiseptic cleaner with kills germs and bonds with the skin to continue killing germs even after washing.   Please do not use if you have an allergy to CHG or antibacterial soaps.   If your skin becomes reddened/irritated stop using the CHG.  Do not shave (including legs and underarms) for at least 48 hours prior to first CHG shower.  It is okay to shave your face.  Please follow these instructions carefully:  1. Shower with CHG Soap the night before surgery and the morning of Surgery. 2. If you choose to wash your hair, wash your hair first as usual with your normal shampoo. 3. After you shampoo, rinse your hair and body thoroughly to remove the Shampoo. 4. Use CHG as you would any other liquid soap. You can apply chg directly to the skin and wash gently with scrungie or a clean washcloth. 5. Apply the CHG Soap to your body ONLY FROM THE NECK DOWN. Do not use on open wounds or open sores. Avoid contact with your eyes, ears, mouth and genitals (private parts). Wash genitals (private parts) with your normal soap. 6. Wash thoroughly, paying special attention to the area where your surgery will be performed. 7. Thoroughly rinse your body with warm water from the neck down. 8. DO NOT shower/wash with your normal soap after using and rinsing off the CHG Soap. 9. Pat yourself dry with a clean towel.  10. Wear clean pajamas.  11. Place clean sheets on your bed the night of your first shower and do not sleep with pets.  Day of Surgery  Do not apply any lotions/deodorants the morning of surgery. Please  wear clean clothes to the hospital/surgery center.   Please read over the following fact sheets that you were given. Pain Booklet, Coughing and Deep Breathing, MRSA Information and Surgical Site Infection Prevention

## 2015-04-17 ENCOUNTER — Encounter (HOSPITAL_COMMUNITY): Payer: Self-pay

## 2015-04-17 ENCOUNTER — Encounter (HOSPITAL_COMMUNITY)
Admission: RE | Admit: 2015-04-17 | Discharge: 2015-04-17 | Disposition: A | Discharge status: Home / Self Care | Payer: Medicare Other | Source: Ambulatory Visit | Attending: Orthopedic Surgery | Admitting: Orthopedic Surgery

## 2015-04-17 DIAGNOSIS — I10 Essential (primary) hypertension: Secondary | ICD-10-CM | POA: Diagnosis not present

## 2015-04-17 DIAGNOSIS — M75101 Unspecified rotator cuff tear or rupture of right shoulder, not specified as traumatic: Secondary | ICD-10-CM | POA: Diagnosis not present

## 2015-04-17 DIAGNOSIS — M12811 Other specific arthropathies, not elsewhere classified, right shoulder: Secondary | ICD-10-CM | POA: Diagnosis not present

## 2015-04-17 DIAGNOSIS — R509 Fever, unspecified: Secondary | ICD-10-CM | POA: Diagnosis not present

## 2015-04-17 HISTORY — DX: Unspecified hearing loss, unspecified ear: H91.90

## 2015-04-17 HISTORY — DX: Pure hypercholesterolemia, unspecified: E78.00

## 2015-04-17 HISTORY — DX: Anesthesia of skin: R20.0

## 2015-04-17 HISTORY — DX: Long term (current) use of other agents affecting estrogen receptors and estrogen levels: Z79.818

## 2015-04-17 HISTORY — DX: Unspecified osteoarthritis, unspecified site: M19.90

## 2015-04-17 HISTORY — DX: Personal history of irradiation: Z92.3

## 2015-04-17 HISTORY — DX: Unspecified urinary incontinence: R32

## 2015-04-17 HISTORY — DX: Other amnesia: R41.3

## 2015-04-17 HISTORY — DX: Essential (primary) hypertension: I10

## 2015-04-17 LAB — SURGICAL PCR SCREEN
MRSA, PCR: POSITIVE — AB
Staphylococcus aureus: POSITIVE — AB

## 2015-04-17 LAB — BASIC METABOLIC PANEL
Anion gap: 10 (ref 5–15)
BUN: 18 mg/dL (ref 6–20)
CHLORIDE: 104 mmol/L (ref 101–111)
CO2: 26 mmol/L (ref 22–32)
CREATININE: 1.25 mg/dL — AB (ref 0.61–1.24)
Calcium: 9.8 mg/dL (ref 8.9–10.3)
GFR calc Af Amer: 60 mL/min — ABNORMAL LOW (ref >60)
GFR calc non Af Amer: 51 mL/min — ABNORMAL LOW (ref >60)
Glucose, Bld: 112 mg/dL — ABNORMAL HIGH (ref 65–99)
Potassium: 4.4 mmol/L (ref 3.5–5.1)
SODIUM: 140 mmol/L (ref 135–145)

## 2015-04-17 LAB — CBC
HEMATOCRIT: 43.2 % (ref 39.0–52.0)
HEMOGLOBIN: 14.3 g/dL (ref 13.0–17.0)
MCH: 29.9 pg (ref 26.0–34.0)
MCHC: 33.1 g/dL (ref 30.0–36.0)
MCV: 90.4 fL (ref 78.0–100.0)
Platelets: 264 10*3/uL (ref 150–400)
RBC: 4.78 MIL/uL (ref 4.22–5.81)
RDW: 13.1 % (ref 11.5–15.5)
WBC: 7.2 10*3/uL (ref 4.0–10.5)

## 2015-04-17 NOTE — Progress Notes (Signed)
PCP - Dr. [name] Cardiologist - denies  EKG- 04/17/15 CXR - denies  Echo and stress test:  pt. Is unsure; requested records from Dr. Brigitte Pulse Cardiac Cath - denies  Patient denies chest pain and shortness of breath at PAT appointment.

## 2015-04-18 NOTE — Progress Notes (Signed)
Anesthesia Chart Review:  Pt is an 82 year old male scheduled for R reverse total shoulder arthroplasty on 04/20/2015 with Dr. Veverly Fells.   PMH includes:  HTN, hyperlipidemia, memory impairment, prostate cancer, post-op N/V. Former smoker. BMI 23.   Medications include: ASA, lisinopril-hctz, namenda, potassium  Preoperative labs reviewed.    EKG 04/17/2015: Sinus bradycardia (57 bpm). Incomplete RBBB. LAFB. Appears unchanged compared to EKG dated 08/25/08.  Echo 08/25/08:  1. Left ventricle: Wall thickness was increased in a pattern of mild LVH. Systolic function was normal. The estimated ejectionfraction was in the range of 60% to 65%. Doppler parameters are consistent with abnormal left ventricular relaxation (grade 1 diastolic dysfunction). 2. Aortic valve: Trivial regurgitation. 3. Pulmonary arteries: PA peak pressure: 52mm Hg (S). 4. Inferior vena cava: The vessel was normal in size; the respirophasic diameter changes were in the normal range (= 50%); findings are consistent with normal central venous pressure. - Impressions: Normal LV systolic function, EF [id number] with mild LV hypertrophy. Mild TR with mild pulmonary hypertension. The RV size and systolic function looks normal.  If no changes, I anticipate pt can proceed with surgery as scheduled.   Willeen Cass, FNP-BC [facility] Short Stay Surgical Center/Anesthesiology Phone: [phone] 04/18/2015 9:20 AM

## 2015-04-19 MED ORDER — CEFAZOLIN SODIUM-DEXTROSE 2-3 GM-% IV SOLR
2.0000 g | INTRAVENOUS | Status: DC
  Filled 2015-04-19: qty 50

## 2015-04-19 MED ORDER — CHLORHEXIDINE GLUCONATE 4 % EX LIQD: 60.0000 mL | Freq: Once | CUTANEOUS | Status: DC

## 2015-04-20 ENCOUNTER — Inpatient Hospital Stay (HOSPITAL_COMMUNITY): Payer: Medicare Other | Admitting: Anesthesiology

## 2015-04-20 ENCOUNTER — Inpatient Hospital Stay (HOSPITAL_COMMUNITY): Payer: Medicare Other

## 2015-04-20 ENCOUNTER — Inpatient Hospital Stay (HOSPITAL_COMMUNITY): Payer: Medicare Other | Admitting: Emergency Medicine

## 2015-04-20 ENCOUNTER — Encounter (HOSPITAL_COMMUNITY): Payer: Self-pay | Admitting: *Deleted

## 2015-04-20 ENCOUNTER — Inpatient Hospital Stay (HOSPITAL_COMMUNITY)
Admission: RE | Admit: 2015-04-20 | Discharge: 2015-04-22 | DRG: 483 | Disposition: A | Discharge status: Home Health | Payer: Medicare Other | Source: Ambulatory Visit | Attending: Orthopedic Surgery | Admitting: Orthopedic Surgery

## 2015-04-20 ENCOUNTER — Encounter (HOSPITAL_COMMUNITY)
Admission: RE | Disposition: A | Discharge status: Home Health | Payer: Self-pay | Source: Ambulatory Visit | Attending: Orthopedic Surgery

## 2015-04-20 DIAGNOSIS — Z96619 Presence of unspecified artificial shoulder joint: Secondary | ICD-10-CM

## 2015-04-20 DIAGNOSIS — R509 Fever, unspecified: Secondary | ICD-10-CM | POA: Diagnosis not present

## 2015-04-20 DIAGNOSIS — E785 Hyperlipidemia, unspecified: Secondary | ICD-10-CM | POA: Diagnosis present

## 2015-04-20 DIAGNOSIS — Z8546 Personal history of malignant neoplasm of prostate: Secondary | ICD-10-CM

## 2015-04-20 DIAGNOSIS — Z471 Aftercare following joint replacement surgery: Secondary | ICD-10-CM | POA: Diagnosis not present

## 2015-04-20 DIAGNOSIS — Z7982 Long term (current) use of aspirin: Secondary | ICD-10-CM

## 2015-04-20 DIAGNOSIS — I10 Essential (primary) hypertension: Secondary | ICD-10-CM | POA: Diagnosis not present

## 2015-04-20 DIAGNOSIS — Z87891 Personal history of nicotine dependence: Secondary | ICD-10-CM | POA: Diagnosis not present

## 2015-04-20 DIAGNOSIS — Z96611 Presence of right artificial shoulder joint: Secondary | ICD-10-CM

## 2015-04-20 DIAGNOSIS — M25511 Pain in right shoulder: Secondary | ICD-10-CM | POA: Diagnosis not present

## 2015-04-20 DIAGNOSIS — M12811 Other specific arthropathies, not elsewhere classified, right shoulder: Secondary | ICD-10-CM | POA: Diagnosis not present

## 2015-04-20 DIAGNOSIS — M19011 Primary osteoarthritis, right shoulder: Secondary | ICD-10-CM | POA: Diagnosis not present

## 2015-04-20 DIAGNOSIS — M75101 Unspecified rotator cuff tear or rupture of right shoulder, not specified as traumatic: Principal | ICD-10-CM | POA: Diagnosis present

## 2015-04-20 DIAGNOSIS — G8918 Other acute postprocedural pain: Secondary | ICD-10-CM | POA: Diagnosis not present

## 2015-04-20 HISTORY — PX: REVERSE SHOULDER ARTHROPLASTY: SHX5054

## 2015-04-20 SURGERY — ARTHROPLASTY, SHOULDER, TOTAL, REVERSE
Anesthesia: General | Site: Shoulder | Laterality: Right

## 2015-04-20 MED ORDER — ACETAMINOPHEN 650 MG RE SUPP: 650.0000 mg | Freq: Four times a day (QID) | RECTAL | Status: DC | PRN

## 2015-04-20 MED ORDER — ONDANSETRON HCL 4 MG PO TABS: 4.0000 mg | ORAL_TABLET | Freq: Four times a day (QID) | ORAL | Status: DC | PRN

## 2015-04-20 MED ORDER — ROCURONIUM BROMIDE 100 MG/10ML IV SOLN
INTRAVENOUS | Status: DC | PRN
  Administered 2015-04-20: 35 mg via INTRAVENOUS

## 2015-04-20 MED ORDER — PHENYLEPHRINE HCL 10 MG/ML IJ SOLN
10.0000 mg | INTRAVENOUS | Status: DC | PRN
  Administered 2015-04-20: 20 ug/min via INTRAVENOUS

## 2015-04-20 MED ORDER — LISINOPRIL 20 MG PO TABS
20.0000 mg | ORAL_TABLET | Freq: Every day | ORAL | Status: DC
  Administered 2015-04-20 – 2015-04-22 (×2): 20 mg via ORAL
  Filled 2015-04-20 (×3): qty 1

## 2015-04-20 MED ORDER — DICLOFENAC SODIUM 1 % TD GEL
1.0000 "application " | Freq: Two times a day (BID) | TRANSDERMAL | Status: DC | PRN
  Filled 2015-04-20: qty 100

## 2015-04-20 MED ORDER — CEFAZOLIN SODIUM-DEXTROSE 2-3 GM-% IV SOLR
2.0000 g | Freq: Four times a day (QID) | INTRAVENOUS | Status: AC
  Administered 2015-04-20 – 2015-04-21 (×3): 2 g via INTRAVENOUS
  Filled 2015-04-20 (×4): qty 50

## 2015-04-20 MED ORDER — ASPIRIN EC 81 MG PO TBEC
81.0000 mg | DELAYED_RELEASE_TABLET | Freq: Every day | ORAL | Status: DC
  Administered 2015-04-21 – 2015-04-22 (×2): 81 mg via ORAL
  Filled 2015-04-20 (×2): qty 1

## 2015-04-20 MED ORDER — DOCUSATE SODIUM 100 MG PO CAPS
100.0000 mg | ORAL_CAPSULE | Freq: Two times a day (BID) | ORAL | Status: DC
  Administered 2015-04-20 – 2015-04-22 (×4): 100 mg via ORAL
  Filled 2015-04-20 (×3): qty 1

## 2015-04-20 MED ORDER — HYDROCHLOROTHIAZIDE 25 MG PO TABS
25.0000 mg | ORAL_TABLET | Freq: Every day | ORAL | Status: DC
  Administered 2015-04-20 – 2015-04-22 (×2): 25 mg via ORAL
  Filled 2015-04-20 (×3): qty 1

## 2015-04-20 MED ORDER — LACTATED RINGERS IV SOLN
INTRAVENOUS | Status: DC
  Administered 2015-04-20: 09:00:00 via INTRAVENOUS

## 2015-04-20 MED ORDER — POLYETHYLENE GLYCOL 3350 17 G PO PACK: 17.0000 g | PACK | Freq: Every day | ORAL | Status: DC | PRN

## 2015-04-20 MED ORDER — CALCIUM-VITAMIN D-VITAMIN K 500-1000-40 MG-UNT-MCG PO CHEW: CHEWABLE_TABLET | ORAL | Status: DC

## 2015-04-20 MED ORDER — GLYCOPYRROLATE 0.2 MG/ML IJ SOLN
INTRAMUSCULAR | Status: DC | PRN
  Administered 2015-04-20: 0.4 mg via INTRAVENOUS

## 2015-04-20 MED ORDER — BUPIVACAINE-EPINEPHRINE (PF) 0.5% -1:200000 IJ SOLN
INTRAMUSCULAR | Status: DC | PRN
  Administered 2015-04-20: 15 mL via PERINEURAL

## 2015-04-20 MED ORDER — BISACODYL 10 MG RE SUPP: 10.0000 mg | Freq: Every day | RECTAL | Status: DC | PRN

## 2015-04-20 MED ORDER — PROPOFOL 10 MG/ML IV BOLUS
INTRAVENOUS | Status: AC
  Filled 2015-04-20: qty 20

## 2015-04-20 MED ORDER — ONDANSETRON HCL 4 MG/2ML IJ SOLN: 4.0000 mg | Freq: Four times a day (QID) | INTRAMUSCULAR | Status: DC | PRN

## 2015-04-20 MED ORDER — POTASSIUM CHLORIDE 20 MEQ/15ML (10%) PO SOLN
2.6000 meq | Freq: Every day | ORAL | Status: DC
  Administered 2015-04-21 – 2015-04-22 (×2): 2.6667 meq via ORAL
  Filled 2015-04-20 (×2): qty 15

## 2015-04-20 MED ORDER — FENTANYL CITRATE (PF) 100 MCG/2ML IJ SOLN
INTRAMUSCULAR | Status: AC
  Administered 2015-04-20: 50 ug
  Filled 2015-04-20: qty 2

## 2015-04-20 MED ORDER — PHENOL 1.4 % MT LIQD: 1.0000 | OROMUCOSAL | Status: DC | PRN

## 2015-04-20 MED ORDER — LIDOCAINE HCL 4 % EX SOLN
CUTANEOUS | Status: DC | PRN
  Administered 2015-04-20: 4 mL via TOPICAL

## 2015-04-20 MED ORDER — POTASSIUM GLUCONATE 595 (99 K) MG PO TABS: 595.0000 mg | ORAL_TABLET | ORAL | Status: DC

## 2015-04-20 MED ORDER — CALCIUM CARBONATE-VITAMIN D 500-200 MG-UNIT PO TABS
1.0000 | ORAL_TABLET | ORAL | Status: DC
  Administered 2015-04-21: 1 via ORAL
  Filled 2015-04-20: qty 1

## 2015-04-20 MED ORDER — VANCOMYCIN HCL IN DEXTROSE 1-5 GM/200ML-% IV SOLN
INTRAVENOUS | Status: AC
  Filled 2015-04-20: qty 200

## 2015-04-20 MED ORDER — MENTHOL 3 MG MT LOZG: 1.0000 | LOZENGE | OROMUCOSAL | Status: DC | PRN

## 2015-04-20 MED ORDER — LISINOPRIL-HYDROCHLOROTHIAZIDE 20-25 MG PO TABS: 1.0000 | ORAL_TABLET | Freq: Every day | ORAL | Status: DC

## 2015-04-20 MED ORDER — ACETAMINOPHEN 325 MG PO TABS
650.0000 mg | ORAL_TABLET | Freq: Four times a day (QID) | ORAL | Status: DC | PRN
  Administered 2015-04-22: 650 mg via ORAL
  Filled 2015-04-20: qty 2

## 2015-04-20 MED ORDER — ADULT MULTIVITAMIN W/MINERALS CH
1.0000 | ORAL_TABLET | Freq: Every day | ORAL | Status: DC
  Administered 2015-04-21 – 2015-04-22 (×2): 1 via ORAL
  Filled 2015-04-20 (×2): qty 1

## 2015-04-20 MED ORDER — FENTANYL CITRATE (PF) 100 MCG/2ML IJ SOLN: 25.0000 ug | INTRAMUSCULAR | Status: DC | PRN

## 2015-04-20 MED ORDER — BUPIVACAINE-EPINEPHRINE 0.25% -1:200000 IJ SOLN
INTRAMUSCULAR | Status: DC | PRN
  Administered 2015-04-20: 10 mL

## 2015-04-20 MED ORDER — LACTATED RINGERS IV SOLN: INTRAVENOUS | Status: DC

## 2015-04-20 MED ORDER — VANCOMYCIN HCL 1000 MG IV SOLR
1000.0000 mg | Freq: Two times a day (BID) | INTRAVENOUS | Status: DC
  Administered 2015-04-20: 1000 mg via INTRAVENOUS
  Filled 2015-04-20 (×2): qty 1000

## 2015-04-20 MED ORDER — PROPOFOL 10 MG/ML IV BOLUS
INTRAVENOUS | Status: DC | PRN
  Administered 2015-04-20: 110 mg via INTRAVENOUS

## 2015-04-20 MED ORDER — SODIUM CHLORIDE 0.9 % IV SOLN
INTRAVENOUS | Status: DC
  Administered 2015-04-20 – 2015-04-22 (×4): via INTRAVENOUS

## 2015-04-20 MED ORDER — METHOCARBAMOL 1000 MG/10ML IJ SOLN
500.0000 mg | Freq: Four times a day (QID) | INTRAVENOUS | Status: DC | PRN
  Filled 2015-04-20: qty 5

## 2015-04-20 MED ORDER — MIDAZOLAM HCL 2 MG/2ML IJ SOLN
INTRAMUSCULAR | Status: AC
  Filled 2015-04-20: qty 2

## 2015-04-20 MED ORDER — METHOCARBAMOL 500 MG PO TABS
500.0000 mg | ORAL_TABLET | Freq: Four times a day (QID) | ORAL | Status: DC | PRN
  Administered 2015-04-20 – 2015-04-22 (×3): 500 mg via ORAL
  Filled 2015-04-20 (×3): qty 1

## 2015-04-20 MED ORDER — FENTANYL CITRATE (PF) 250 MCG/5ML IJ SOLN
INTRAMUSCULAR | Status: AC
  Filled 2015-04-20: qty 5

## 2015-04-20 MED ORDER — NEOSTIGMINE METHYLSULFATE 10 MG/10ML IV SOLN
INTRAVENOUS | Status: DC | PRN
  Administered 2015-04-20: 3 mg via INTRAVENOUS

## 2015-04-20 MED ORDER — MEMANTINE HCL ER 28 MG PO CP24
28.0000 mg | ORAL_CAPSULE | Freq: Every day | ORAL | Status: DC
  Administered 2015-04-21 – 2015-04-22 (×2): 28 mg via ORAL
  Filled 2015-04-20 (×2): qty 1

## 2015-04-20 MED ORDER — TURMERIC POWD: 2.5000 mL | Freq: Every day | Status: DC

## 2015-04-20 MED ORDER — OXYCODONE HCL 5 MG PO TABS
5.0000 mg | ORAL_TABLET | ORAL | Status: DC | PRN
  Administered 2015-04-20 (×3): 5 mg via ORAL
  Administered 2015-04-21 – 2015-04-22 (×9): 10 mg via ORAL
  Filled 2015-04-20 (×4): qty 2
  Filled 2015-04-20: qty 1
  Filled 2015-04-20 (×4): qty 2
  Filled 2015-04-20: qty 1
  Filled 2015-04-20: qty 2
  Filled 2015-04-20: qty 1

## 2015-04-20 MED ORDER — BUPIVACAINE-EPINEPHRINE (PF) 0.25% -1:200000 IJ SOLN
INTRAMUSCULAR | Status: AC
  Filled 2015-04-20: qty 30

## 2015-04-20 MED ORDER — OXYCODONE-ACETAMINOPHEN 5-325 MG PO TABS: 1.0000 | ORAL_TABLET | ORAL | Status: DC | PRN

## 2015-04-20 MED ORDER — METOCLOPRAMIDE HCL 5 MG/ML IJ SOLN: 5.0000 mg | Freq: Three times a day (TID) | INTRAMUSCULAR | Status: DC | PRN

## 2015-04-20 MED ORDER — FENTANYL CITRATE (PF) 250 MCG/5ML IJ SOLN
INTRAMUSCULAR | Status: DC | PRN
  Administered 2015-04-20: 50 ug via INTRAVENOUS

## 2015-04-20 MED ORDER — METOCLOPRAMIDE HCL 5 MG PO TABS: 5.0000 mg | ORAL_TABLET | Freq: Three times a day (TID) | ORAL | Status: DC | PRN

## 2015-04-20 MED ORDER — METHOCARBAMOL 500 MG PO TABS: 500.0000 mg | ORAL_TABLET | Freq: Three times a day (TID) | ORAL | Status: DC | PRN

## 2015-04-20 MED ORDER — ACETAMINOPHEN 500 MG PO TABS
1000.0000 mg | ORAL_TABLET | Freq: Three times a day (TID) | ORAL | Status: DC | PRN
  Administered 2015-04-21: 1000 mg via ORAL
  Filled 2015-04-20: qty 2

## 2015-04-20 MED ORDER — ONDANSETRON HCL 4 MG/2ML IJ SOLN
INTRAMUSCULAR | Status: DC | PRN
  Administered 2015-04-20: 4 mg via INTRAVENOUS

## 2015-04-20 MED ORDER — 0.9 % SODIUM CHLORIDE (POUR BTL) OPTIME
TOPICAL | Status: DC | PRN
  Administered 2015-04-20: 1000 mL

## 2015-04-20 MED ORDER — EPHEDRINE SULFATE 50 MG/ML IJ SOLN
INTRAMUSCULAR | Status: DC | PRN
Start: 2015-04-20 — End: 2015-04-20
  Administered 2015-04-20: 5 mg via INTRAVENOUS

## 2015-04-20 MED ORDER — MORPHINE SULFATE (PF) 2 MG/ML IV SOLN
2.0000 mg | INTRAVENOUS | Status: DC | PRN
  Administered 2015-04-21: 4 mg via INTRAVENOUS
  Administered 2015-04-21: 2 mg via INTRAVENOUS
  Filled 2015-04-20 (×2): qty 1
  Filled 2015-04-20: qty 2

## 2015-04-20 SURGICAL SUPPLY — 63 items
BIT DRILL 170X2.5X (BIT) IMPLANT
BIT DRILL 5/64X5 DISP (BIT) ×2 IMPLANT
BIT DRL 170X2.5X (BIT)
BLADE SAG 18X100X1.27 (BLADE) ×2 IMPLANT
CAPT SHLDR REVTOTAL 1 ×2 IMPLANT
COVER SURGICAL LIGHT HANDLE (MISCELLANEOUS) ×2 IMPLANT
DRAPE IMP U-DRAPE 54X76 (DRAPES) ×4 IMPLANT
DRAPE INCISE IOBAN 66X45 STRL (DRAPES) ×2 IMPLANT
DRAPE ORTHO SPLIT 77X108 STRL (DRAPES) ×2
DRAPE SURG ORHT 6 SPLT 77X108 (DRAPES) ×2 IMPLANT
DRAPE U-SHAPE 47X51 STRL (DRAPES) ×2 IMPLANT
DRILL 2.5 (BIT)
DRSG ADAPTIC 3X8 NADH LF (GAUZE/BANDAGES/DRESSINGS) ×2 IMPLANT
DRSG PAD ABDOMINAL 8X10 ST (GAUZE/BANDAGES/DRESSINGS) ×2 IMPLANT
DURAPREP 26ML APPLICATOR (WOUND CARE) ×2 IMPLANT
ELECT BLADE 4.0 EZ CLEAN MEGAD (MISCELLANEOUS) ×2
ELECT NEEDLE TIP 2.8 STRL (NEEDLE) ×2 IMPLANT
ELECT REM PT RETURN 9FT ADLT (ELECTROSURGICAL) ×2
ELECTRODE BLDE 4.0 EZ CLN MEGD (MISCELLANEOUS) ×1 IMPLANT
ELECTRODE REM PT RTRN 9FT ADLT (ELECTROSURGICAL) ×1 IMPLANT
GAUZE SPONGE 4X4 12PLY STRL (GAUZE/BANDAGES/DRESSINGS) ×2 IMPLANT
GLOVE BIOGEL M STER SZ 6 (GLOVE) ×2 IMPLANT
GLOVE BIOGEL PI ORTHO PRO 7.5 (GLOVE) ×1
GLOVE BIOGEL PI ORTHO PRO SZ8 (GLOVE) ×1
GLOVE ORTHO TXT STRL SZ7.5 (GLOVE) ×2 IMPLANT
GLOVE PI ORTHO PRO STRL 7.5 (GLOVE) ×1 IMPLANT
GLOVE PI ORTHO PRO STRL SZ8 (GLOVE) ×1 IMPLANT
GLOVE SURG ORTHO 8.5 STRL (GLOVE) ×2 IMPLANT
GLOVE SURG SS PI 6.0 STRL IVOR (GLOVE) ×2 IMPLANT
GOWN STRL REUS W/ TWL LRG LVL3 (GOWN DISPOSABLE) ×1 IMPLANT
GOWN STRL REUS W/ TWL XL LVL3 (GOWN DISPOSABLE) ×2 IMPLANT
GOWN STRL REUS W/TWL LRG LVL3 (GOWN DISPOSABLE) ×1
GOWN STRL REUS W/TWL XL LVL3 (GOWN DISPOSABLE) ×2
HANDPIECE INTERPULSE COAX TIP (DISPOSABLE)
KIT BASIN OR (CUSTOM PROCEDURE TRAY) ×2 IMPLANT
KIT ROOM TURNOVER OR (KITS) ×2 IMPLANT
MANIFOLD NEPTUNE II (INSTRUMENTS) ×2 IMPLANT
NEEDLE 1/2 CIR MAYO (NEEDLE) ×2 IMPLANT
NEEDLE HYPO 25GX1X1/2 BEV (NEEDLE) ×2 IMPLANT
NS IRRIG 1000ML POUR BTL (IV SOLUTION) ×2 IMPLANT
PACK SHOULDER (CUSTOM PROCEDURE TRAY) ×2 IMPLANT
PAD ARMBOARD 7.5X6 YLW CONV (MISCELLANEOUS) ×4 IMPLANT
PIN METAGLENE 2.5 (PIN) IMPLANT
SET HNDPC FAN SPRY TIP SCT (DISPOSABLE) IMPLANT
SLING ARM LRG ADULT FOAM STRAP (SOFTGOODS) IMPLANT
SLING ARM MED ADULT FOAM STRAP (SOFTGOODS) IMPLANT
SPONGE LAP 18X18 X RAY DECT (DISPOSABLE) IMPLANT
SPONGE LAP 4X18 X RAY DECT (DISPOSABLE) ×2 IMPLANT
STRIP CLOSURE SKIN 1/2X4 (GAUZE/BANDAGES/DRESSINGS) ×2 IMPLANT
SUCTION FRAZIER HANDLE 10FR (MISCELLANEOUS) ×1
SUCTION TUBE FRAZIER 10FR DISP (MISCELLANEOUS) ×1 IMPLANT
SUT FIBERWIRE #2 38 T-5 BLUE (SUTURE) ×4
SUT MNCRL AB 4-0 PS2 18 (SUTURE) ×2 IMPLANT
SUT VIC AB 2-0 CT1 27 (SUTURE) ×1
SUT VIC AB 2-0 CT1 TAPERPNT 27 (SUTURE) ×1 IMPLANT
SUT VICRYL 0 CT 1 36IN (SUTURE) ×2 IMPLANT
SUTURE FIBERWR #2 38 T-5 BLUE (SUTURE) ×2 IMPLANT
SYR CONTROL 10ML LL (SYRINGE) ×2 IMPLANT
TOWEL OR 17X24 6PK STRL BLUE (TOWEL DISPOSABLE) ×2 IMPLANT
TOWEL OR 17X26 10 PK STRL BLUE (TOWEL DISPOSABLE) ×2 IMPLANT
TRAY FOLEY CATH 16FRSI W/METER (SET/KITS/TRAYS/PACK) IMPLANT
WATER STERILE IRR 1000ML POUR (IV SOLUTION) ×2 IMPLANT
YANKAUER SUCT BULB TIP NO VENT (SUCTIONS) ×2 IMPLANT

## 2015-04-20 NOTE — Discharge Instructions (Signed)
Ice to the shoulder constantly.  Ok to remove the sling while seated or in bed.  Use the sling out of the home for support.  Prop a pillow or blanket behind the right elbow to keep the arm across the waist.  Keep the incision clean and dry and covered for one week, then ok to get it wet in the shower.  Do exercises four times per day  No heavy pushing pulling or lifting with the right arm...ok for light use and ADLs though  Follow up with Dr Veverly Fells in two weeks, call [phone] for appointment

## 2015-04-20 NOTE — Interval H&P Note (Signed)
History and Physical Interval Note:  04/20/2015 10:42 AM  Andre Ard.  has presented today for surgery, with the diagnosis of right shoulder rotator cuff tear arthropathy  The various methods of treatment have been discussed with the patient and family. After consideration of risks, benefits and other options for treatment, the patient has consented to  Procedure(s): RIGHT REVERSE TOTAL SHOULDER ARTHROPLASTY (Right) as a surgical intervention .  The patient's history has been reviewed, patient examined, no change in status, stable for surgery.  I have reviewed the patient's chart and labs.  Questions were answered to the patient's satisfaction.     Allen Basista,STEVEN R

## 2015-04-20 NOTE — Progress Notes (Signed)
Utilization review completed.  

## 2015-04-20 NOTE — Anesthesia Postprocedure Evaluation (Signed)
Anesthesia Post Note  Patient: Andre Casey.  Procedure(s) Performed: Procedure(s) (LRB): RIGHT REVERSE TOTAL SHOULDER ARTHROPLASTY (Right)  Patient location during evaluation: PACU Anesthesia Type: General and Regional Level of consciousness: awake and alert Pain management: pain level controlled Vital Signs Assessment: post-procedure vital signs reviewed and stable Respiratory status: spontaneous breathing, nonlabored ventilation, respiratory function stable and patient connected to nasal cannula oxygen Cardiovascular status: blood pressure returned to baseline and stable Postop Assessment: no signs of nausea or vomiting Anesthetic complications: no    Last Vitals:  Filed Vitals:   04/20/15 1430 04/20/15 1445  BP: 127/69 131/72  Pulse: 58 58  Temp:  36.4 C  Resp: 13 16    Last Pain: There were no vitals filed for this visit.               Effie Berkshire

## 2015-04-20 NOTE — Brief Op Note (Signed)
04/20/2015  1:11 PM  PATIENT:  Andre Casey.  82 y.o. male  PRE-OPERATIVE DIAGNOSIS:  right shoulder rotator cuff tear arthropathy  POST-OPERATIVE DIAGNOSIS:  right shoulder rotator cuff tear arthropathy  PROCEDURE:  Procedure(s): RIGHT REVERSE TOTAL SHOULDER ARTHROPLASTY (Right)  DePuy Delta Xtend prosthesis SURGEON:  Surgeon(s) and Role:    * Netta Cedars, MD - Primary  PHYSICIAN ASSISTANT:   ASSISTANTS: Ventura Bruns, PA-C   ANESTHESIA:   regional and general  EBL:  Total I/O In: 800 [I.V.:800] Out: 150 [Blood:150]  BLOOD ADMINISTERED:none  DRAINS: none   LOCAL MEDICATIONS USED:  MARCAINE     SPECIMEN:  No Specimen  DISPOSITION OF SPECIMEN:  N/A  COUNTS:  YES  TOURNIQUET:  * No tourniquets in log *  DICTATION: .Other Dictation: Dictation Number [id number]  PLAN OF CARE: Admit to inpatient   PATIENT DISPOSITION:  PACU - hemodynamically stable.   Delay start of Pharmacological VTE agent (>24hrs) due to surgical blood loss or risk of bleeding: not applicable

## 2015-04-20 NOTE — Anesthesia Preprocedure Evaluation (Addendum)
Anesthesia Evaluation  Patient identified by MRN, date of birth, ID band Patient awake    History of Anesthesia Complications (+) PONV and history of anesthetic complications  Airway Mallampati: II  TM Distance: >3 FB Neck ROM: Full    Dental  (+) Upper Dentures, Partial Lower, Dental Advisory Given   Pulmonary former smoker,    breath sounds clear to auscultation       Cardiovascular hypertension, Pt. on medications  Rhythm:Regular Rate:Normal     Neuro/Psych negative neurological ROS  negative psych ROS   GI/Hepatic negative GI ROS, Neg liver ROS,   Endo/Other  negative endocrine ROS  Renal/GU negative Renal ROS  negative genitourinary   Musculoskeletal  (+) Arthritis ,   Abdominal   Peds negative pediatric ROS (+)  Hematology negative hematology ROS (+)   Anesthesia Other Findings - HLD  Reproductive/Obstetrics negative OB ROS                           Lab Results  Component Value Date   WBC 7.2 04/17/2015   HGB 14.3 04/17/2015   HCT 43.2 04/17/2015   MCV 90.4 04/17/2015   PLT 264 04/17/2015   Lab Results  Component Value Date   CREATININE 1.25* 04/17/2015   BUN 18 04/17/2015   NA 140 04/17/2015   K 4.4 04/17/2015   CL 104 04/17/2015   CO2 26 04/17/2015   Lab Results  Component Value Date   INR 1.1 08/25/2008   INR 0.9 03/17/2007   03/2015 EKG: sinus bradycardia, incomplete RBBB.   Anesthesia Physical Anesthesia Plan  ASA: III  Anesthesia Plan: General   Post-op Pain Management: GA combined w/ Regional for post-op pain   Induction: Intravenous  Airway Management Planned: Oral ETT  Additional Equipment:   Intra-op Plan:   Post-operative Plan: Extubation in OR  Informed Consent: I have reviewed the patients History and Physical, chart, labs and discussed the procedure including the risks, benefits and alternatives for the proposed anesthesia with the  patient or authorized representative who has indicated his/her understanding and acceptance.   Dental advisory given  Plan Discussed with: CRNA  Anesthesia Plan Comments:         Anesthesia Quick Evaluation

## 2015-04-20 NOTE — Op Note (Signed)
Andre Casey, Andre Casey          ACCOUNT NO.:  [id number]  MEDICAL RECORD NO.:  [id number]  LOCATION:  5N07C                        FACILITY:  [facility]  PHYSICIAN:  Doran Heater. Veverly Fells, M.D. DATE OF BIRTH:  December 13, 1929  DATE OF PROCEDURE:  04/20/2015 DATE OF DISCHARGE:                              OPERATIVE REPORT   PREOPERATIVE DIAGNOSIS:  Right shoulder end-stage rotator cuff tear arthropathy.  POSTOPERATIVE DIAGNOSIS:  Right shoulder end-stage rotator cuff tear arthropathy.  PROCEDURE PERFORMED:  Right reverse total shoulder arthroplasty using DePuy Delta Xtend prosthesis.  ATTENDING SURGEON:  Doran Heater. Veverly Fells, M.D.  ASSISTANT:  [name], PA-C, who was scrubbed the entire procedure and necessary for satisfactory completion of surgery.  ANESTHESIA:  General anesthesia was used plus interscalene block.  ESTIMATED BLOOD LOSS:  150 mL.  FLUID REPLACEMENT:  1500 mL crystalloid.  INSTRUMENT COUNTS:  Correct.  COMPLICATIONS:  There were no complications.  ANTIBIOTICS:  Perioperative antibiotics were given.  INDICATIONS:  The patient is an 82 year old male with worsening right shoulder pain and dysfunction secondary to rotator cuff tear arthropathy.  The patient has had progressive pain despite conservative management, presents for operative treatment to restore fixed fulcrum mechanics to the shoulder, eliminate pain and restore some function.  We counseled the patient regarding the need for reverse total shoulder given an absence of rotator cuff and subscap tissue and he agreed to proceed.  Risks and benefits discussed, informed consent obtained.  DESCRIPTION OF PROCEDURE:  After an adequate level of anesthesia achieved, the patient positioned in the modified beach-chair position, left shoulder was padded appropriately and protected on a Mayo stand. The right shoulder was sterilely prepped and draped in usual manner. Time-out was called.  Correct patient and correct  site verified.  We then initiated surgery with standard deltopectoral approach starting at the coracoid process extending down to the anterior humerus, dissected down through the subcutaneous tissues using the Bovie.  We identified the cephalic vein, took it laterally over the deltoid, pectoralis taken medially.  The conjoined tendon was retracted medially.  We then identified a completely absent subscapularis.  There was a tiny bit of soft tissue and capsule in the inferior portion of the humeral neck, which we removed and then tagged for protection of the axillary nerve. There was no biceps present.  The rotator cuff was also absent back to the teres minor.  We then extended the shoulder, delivered the humeral head out of the wound.  We entered the proximal humerus with a 6-mm reamer and reamed up to size 14 diameter canal size.  We then placed our 14 head resection guide in place and then resected the humeral head based on that intramedullary guide with a 10-degree retroverted alignment.  Once we had that head cut done, we finished our metaphyseal preparation with metaphyseal reamer for an epi-2 right.  Then, we impacted our trial prosthesis in place, which fit nicely and again was an about 10 degrees of retroversion.  We retracted the humerus posteriorly.  We had also removed excess osteophytes prior to placement of that stem and did a 360-degree capsular removal.  Again, teres minor was preserved, subscapularis absent.  Once we had excellent exposure, we found our  center point and drilled with the guidepin.  We then reamed for the metaglene.  Once we had reamed our metaglene flush and done our peripheral hand reaming, we drilled out the central peg hole and impacted our metaglene into position, this was for the DePuy Delta Xtend prosthesis.  We had nice bony support for that metaglene, we then drilled for our screws.  We had a 48 inferiorly, a 42 up at the base of the coracoid and  then an 18 posteriorly, we could not get one in the front.  We had excellent security of our metaglene.  We locked our locking screws in position and then placed a 42 standard glenosphere onto the metaglene and impacted and screwed that in place.  We were careful to protect the axillary nerve during the placement of our glenosphere.  We were pleased with the coverage, it was inferiorly located and freeing clear underneath there.  So, then, we trialed with a 42+ 3 trial poly and reduced the shoulder with a nice snap.  We had excellent stability, negative sulcus, nice tight conjoined, no gapping with external rotation.  We then removed the trial components on the humeral side.  We irrigated thoroughly using impaction grafting technique with available bone graft from the humeral head and impacted our HA-coated 14 body epi-2 right metaphysis in about 10 degrees of retroversion.  Once that was impacted in position, it was secured. Then, we went ahead and selected our real 42+ 3 poly, impacted that in place and then reduced the shoulder.  We did the 42+ 3, reduced the shoulder and again had excellent soft tissue balancing and stability. We then went ahead and irrigated thoroughly and then removed that little bit of subscap/capsule to make sure that would not interfere with function and checked our axillary nerve, which was in good position and safe, and then we went ahead and repaired the deltopectoral interval with 0 Vicryl suture followed by 2-0 Vicryl for subcutaneous closure and 4-0 Monocryl for skin.  Steri-Strips applied followed by sterile dressing.  The patient tolerated the surgery well.     Doran Heater. Veverly Fells, M.D.     SRN/MEDQ  D:  04/20/2015  T:  04/20/2015  Job:  [id number]

## 2015-04-20 NOTE — Progress Notes (Signed)
PHARMACIST - PHYSICIAN ORDER COMMUNICATION  CONCERNING: P&T Medication Policy on Herbal Medications  DESCRIPTION:  This patient's order for:  Tumeric  has been noted.  This product(s) is classified as an "herbal" or natural product. Due to a lack of definitive safety studies or FDA approval, nonstandard manufacturing practices, plus the potential risk of unknown drug-drug interactions while on inpatient medications, the Pharmacy and Therapeutics Committee does not permit the use of "herbal" or natural products of this type within [facility].   ACTION TAKEN: The pharmacy department is unable to verify this order at this time. Please reevaluate patient's clinical condition at discharge and address if the herbal or natural product(s) should be resumed at that time.   Kelvin Cellar, RPh  Pager: [phone] 04/20/2015 3:44 PM

## 2015-04-20 NOTE — Anesthesia Procedure Notes (Addendum)
Anesthesia Regional Block:  Interscalene brachial plexus block  Pre-Anesthetic Checklist: ,, timeout performed, Correct Patient, Correct Site, Correct Laterality, Correct Procedure, Correct Position, site marked, Risks and benefits discussed,  Surgical consent,  Pre-op evaluation,  At surgeon's request and post-op pain management  Laterality: Right  Prep: chloraprep       Needles:  Injection technique: Single-shot  Needle Type: Echogenic Needle     Needle Length: 9cm 9 cm Needle Gauge: 21 and 21 G    Additional Needles:  Procedures: ultrasound guided (picture in chart) Interscalene brachial plexus block Narrative:  Start time: 04/20/2015 10:28 AM End time: 04/20/2015 10:30 AM Injection made incrementally with aspirations every 5 mL.  Performed by: Personally  Anesthesiologist: Suella Broad D  Additional Notes: No immediate complications noted.    Procedure Name: Intubation Date/Time: 04/20/2015 10:55 AM Performed by: Mariea Clonts Pre-anesthesia Checklist: Patient identified, Timeout performed, Emergency Drugs available, Suction available and Patient being monitored Patient Re-evaluated:Patient Re-evaluated prior to inductionOxygen Delivery Method: Circle system utilized Preoxygenation: Pre-oxygenation with 100% oxygen Intubation Type: IV induction Ventilation: Mask ventilation without difficulty Laryngoscope Size: Miller and 2 Grade View: Grade I Tube type: Oral Tube size: 7.0 mm Number of attempts: 1 Placement Confirmation: ETT inserted through vocal cords under direct vision,  breath sounds checked- equal and bilateral and positive ETCO2 Tube secured with: Tape Dental Injury: Teeth and Oropharynx as per pre-operative assessment

## 2015-04-20 NOTE — Transfer of Care (Signed)
Immediate Anesthesia Transfer of Care Note  Patient: Andre Casey.  Procedure(s) Performed: Procedure(s): RIGHT REVERSE TOTAL SHOULDER ARTHROPLASTY (Right)  Patient Location: PACU  Anesthesia Type:GA combined with regional for post-op pain  Level of Consciousness: awake, alert  and oriented  Airway & Oxygen Therapy: Patient Spontanous Breathing and Patient connected to nasal cannula oxygen  Post-op Assessment: Report given to RN and Post -op Vital signs reviewed and stable  Post vital signs: Reviewed and stable  Last Vitals:  Filed Vitals:   04/20/15 0843 04/20/15 1300  BP: 164/81   Pulse: 66 71  Temp: 36.7 C 36.5 C  Resp: 20 16    Complications: No apparent anesthesia complications

## 2015-04-21 NOTE — Progress Notes (Signed)
Occupational Therapy Treatment Patient Details Name: Andre Casey. MRN: [id number] DOB: 01/23/1930 Today's Date: 04/21/2015    History of present illness s/p R TSA   OT comments  Pt seen for second session for pt/family education. Pt states his pain is "out of control" and he is not comfortable D/C home today and is requesting to stay tonight. Began education with family regarding compensatory techniques for ADL and functional mobility, in addition to HEP and management of RUE. Apparently pt's son is flying back to [facility] in the am and will not be able to assist with his Dad's care. Wife (caregiver) states that she has memory problems from a concussion.Would be beneficial to pt/fmaily to have HHOT/PT after D/C to increase appropriate carry over with protocol, maximize independence with ADL  and reduce risk of falls. Son also requesting [place] therapy. Plan of care change discussed with nsg. Will see in am.   Follow Up Recommendations  Home health OT;Supervision/Assistance - 24 hour    Equipment Recommendations  None recommended by OT    Recommendations for Other Services      Precautions / Restrictions Precautions Precautions: Shoulder Type of Shoulder Precautions: active; FF 0-140; Abd 0-60; ER 0-30 Shoulder Interventions: Shoulder sling/immobilizer;Off for dressing/bathing/exercises;For comfort Precaution Booklet Issued: Yes (comment) Restrictions RUE Weight Bearing: Non weight bearing       Mobility Bed Mobility Overal bed mobility: Needs Assistance             General bed mobility comments: Educated pt again on bed mobility and need to get OOB on non-operated side. wife verbalized understadning. also educated pt that he may want to sleep in a recliner initially.  Transfers Overall transfer level: Needs assistance   Transfers: Sit to/from Stand Sit to Stand: Min guard              Balance             Standing balance-Leahy Scale: Fair                      ADL                                       Functional mobility during ADLs: Min guard General ADL Comments: Wife/son presetn for educaiton. Pt complaining of increased pain and states " I don't know what I did but my shoulder is killing me". Began education on compensatory techniques for ADL. Son expressing concerns about his parents going home today wiwh difficulty with pain management.  Pt lying in bed without RUE supported. Educated pt on appropriate positioning in bed/sitting with pillows supporting shoulder and use of ice for pain and edema control. Family verbalized understanding.      Vision                     Perception     Praxis      Cognition   Behavior During Therapy: [facility] for tasks assessed/performed Overall Cognitive Status: Within Functional Limits for tasks assessed                       Extremity/Trunk Assessment               Exercises     Shoulder Instructions       General Comments      Pertinent Vitals/ Pain  Pain Assessment: 0-10 Pain Score: 8  Pain Location: R shoulder Pain Descriptors / Indicators: Aching;Constant;Throbbing Pain Intervention(s): Limited activity within patient's tolerance;Repositioned;Ice applied  Home Living                                          Prior Functioning/Environment              Frequency Min 2X/week     Progress Toward Goals  OT Goals(current goals can now be found in the care plan section)  Progress towards OT goals: Progressing toward goals  Acute Rehab OT Goals Patient Stated Goal: to go home OT Goal Formulation: With patient Time For Goal Achievement: 04/28/15 Potential to Achieve Goals: Good ADL Goals Pt Will Perform Upper Body Bathing: with min assist;with caregiver independent in assisting;sitting Pt Will Perform Upper Body Dressing: with min assist;with caregiver independent in assisting;sitting Pt Will Transfer to  Toilet: with supervision;ambulating (with cane) Pt Will Perform Toileting - Clothing Manipulation and hygiene: with supervision;with caregiver independent in assisting;sit to/from stand Pt/caregiver will Perform Home Exercise Program: Right Upper extremity;With written HEP provided;With Supervision (per protocol) Additional ADL Goal #1: Pt/wife independent with RUE management  in sitting/bed and sling management  Plan Discharge plan needs to be updated    Co-evaluation                 End of Session Equipment Utilized During Treatment: Gait belt   Activity Tolerance Patient limited by pain   Patient Left in bed;with call bell/phone within reach;with family/visitor present;with SCD's reapplied   Nurse Communication Mobility status;Precautions;Weight bearing status        Time: [id number] OT Time Calculation (min): 40 min  Charges: OT General Charges $OT Visit: 1 Procedure OT Treatments $Therapeutic Activity: 23-37 mins  Lamorris Knoblock,HILLARY 04/21/2015, 3:20 PM   [facility], OTR/L  [phone] 04/21/2015

## 2015-04-21 NOTE — Discharge Summary (Signed)
Physician Discharge Summary   Patient ID: Andre Casey. MRN: [id number] DOB/AGE: 1929/10/14 82 y.o.  Admit date: 04/20/2015 Discharge date: 04/21/2015  Admission Diagnoses:  Active Problems:   S/P shoulder replacement   Discharge Diagnoses:  Same   Surgeries: Procedure(s): RIGHT REVERSE TOTAL SHOULDER ARTHROPLASTY on 04/20/2015   Consultants: OT, PT, D/C planning  Discharged Condition: Stable  Hospital Course: Andre Casey. is an 82 y.o. male who was admitted 04/20/2015 with a chief complaint of right shoulder pain, and found to have a diagnosis of right shoulder rotator cuff tear arthropathy.  They were brought to the operating room on 04/20/2015 and underwent the above named procedures.    The patient had an uncomplicated hospital course and was stable for discharge.  Recent vital signs:  Filed Vitals:   04/21/15 0052 04/21/15 0559  BP: 97/56 99/51  Pulse: 82 85  Temp: 101.2 F (38.4 C) 100 F (37.8 C)  Resp: 16 16    Recent laboratory studies:  Results for orders placed or performed during the hospital encounter of 04/17/15  Surgical pcr screen  Result Value Ref Range   MRSA, PCR POSITIVE (A) NEGATIVE   Staphylococcus aureus POSITIVE (A) NEGATIVE  CBC  Result Value Ref Range   WBC 7.2 4.0 - 10.5 K/uL   RBC 4.78 4.22 - 5.81 MIL/uL   Hemoglobin 14.3 13.0 - 17.0 g/dL   HCT 43.2 39.0 - 52.0 %   MCV 90.4 78.0 - 100.0 fL   MCH 29.9 26.0 - 34.0 pg   MCHC 33.1 30.0 - 36.0 g/dL   RDW 13.1 11.5 - 15.5 %   Platelets 264 150 - 400 K/uL  Basic metabolic panel  Result Value Ref Range   Sodium 140 135 - 145 mmol/L   Potassium 4.4 3.5 - 5.1 mmol/L   Chloride 104 101 - 111 mmol/L   CO2 26 22 - 32 mmol/L   Glucose, Bld 112 (H) 65 - 99 mg/dL   BUN 18 6 - 20 mg/dL   Creatinine, Ser 1.25 (H) 0.61 - 1.24 mg/dL   Calcium 9.8 8.9 - 10.3 mg/dL   GFR calc non Af Amer 51 (L) >60 mL/min   GFR calc Af Amer 60 (L) >60 mL/min   Anion gap 10 5 - 15     Discharge Medications:     Medication List    TAKE these medications        acetaminophen 500 MG tablet  Commonly known as:  TYLENOL  Take 1,000 mg by mouth every 8 (eight) hours as needed for mild pain or headache.     aspirin 81 MG tablet  Take 81 mg by mouth daily.     CALCIUM + D PO  Take 1 tablet by mouth every other day.     diclofenac sodium 1 % Gel  Commonly known as:  VOLTAREN  Apply 1 application topically 2 (two) times daily as needed.     lisinopril-hydrochlorothiazide 20-25 MG tablet  Commonly known as:  PRINZIDE,ZESTORETIC  Take 1 tablet by mouth daily.     methocarbamol 500 MG tablet  Commonly known as:  ROBAXIN  Take 1 tablet (500 mg total) by mouth 3 (three) times daily as needed.     multivitamin with minerals tablet  Take 1 tablet by mouth daily.     NAMENDA XR 28 MG Cp24 24 hr capsule  Generic drug:  memantine  Take 28 mg by mouth daily.     oxyCODONE-acetaminophen 5-325 MG tablet  Commonly known as:  ROXICET  Take 1-2 tablets by mouth every 4 (four) hours as needed for severe pain.     potassium gluconate 595 (99 K) MG Tabs tablet  Take 595 mg by mouth every other day.     Turmeric Powd  2.5 mLs by Does not apply route daily. Mix with water and 2 squirts of lemon juice        Diagnostic Studies: Dg Shoulder Right Port  2015/05/20  CLINICAL DATA:  Right total shoulder arthroplasty EXAM: PORTABLE RIGHT SHOULDER - 2+ VIEW COMPARISON:  None. FINDINGS: Changes of right shoulder replacement. Normal alignment. Degenerative changes in the right AC joint. No hardware or bony complicating feature. IMPRESSION: Right shoulder replacement.  No visible complicating feature. Electronically Signed   By: Rolm Baptise M.D.   On: 2015/05/20 19:01    Disposition: home        Follow-up Information    Follow up with Aneyah Lortz,STEVEN R, MD. Call in 2 weeks.   Specialty:  Orthopedic Surgery   Why:  [phone]   Contact information:   [address] [place] [address] [phone]        Signed: Augustin Schooling 04/21/2015, 8:04 AM

## 2015-04-21 NOTE — Progress Notes (Signed)
Orthopedics Progress Note  Subjective: Patient reports doing well this morning  Objective:  Filed Vitals:   04/21/15 0052 04/21/15 0559  BP: 97/56 99/51  Pulse: 82 85  Temp: 101.2 F (38.4 C) 100 F (37.8 C)  Resp: 16 16    General: Awake and alert  Musculoskeletal: right shoulder dressing CDI, NVI including Ax nerve Neurovascularly intact  Lab Results  Component Value Date   WBC 7.2 04/17/2015   HGB 14.3 04/17/2015   HCT 43.2 04/17/2015   MCV 90.4 04/17/2015   PLT 264 04/17/2015       Component Value Date/Time   NA 140 04/17/2015 0843   K 4.4 04/17/2015 0843   CL 104 04/17/2015 0843   CO2 26 04/17/2015 0843   GLUCOSE 112* 04/17/2015 0843   BUN 18 04/17/2015 0843   CREATININE 1.25* 04/17/2015 0843   CALCIUM 9.8 04/17/2015 0843   GFRNONAA 51* 04/17/2015 0843   GFRAA 60* 04/17/2015 0843    Lab Results  Component Value Date   INR 1.1 08/25/2008   INR 0.9 03/17/2007    Assessment/Plan: POD #1 s/p Procedure(s): RIGHT REVERSE TOTAL SHOULDER ARTHROPLASTY - Doing Well PT, OT, D./C planning - patient may go home later today depending on how he does with therapy and his balance   Doran Heater. Veverly Fells, MD 04/21/2015 8:01 AM

## 2015-04-21 NOTE — Progress Notes (Signed)
Occupational Therapy Evaluation Patient Details Name: Andre Casey. MRN: [id number] DOB: [birth year] Today's Date: 04/21/2015    History of Present Illness s/p R TSA   Clinical Impression   Making excellent progress. Began education on HEP and compensatory techniques for ADL. Pt safe to D/C home today. Need to educate family when they arrive - nsg aware (pager # [phone]). Handouts given to pt. Will return prior to D/C.     Follow Up Recommendations  Supervision/Assistance - 24 hour;Other (comment) (follow up with Dr. Veverly Fells to progress therapy)    Equipment Recommendations  None recommended by OT    Recommendations for Other Services       Precautions / Restrictions Precautions Precautions: Shoulder Type of Shoulder Precautions: active; FF 0-140; Abd 0-60; ER 0-30 Shoulder Interventions: Shoulder sling/immobilizer;Off for dressing/bathing/exercises;For comfort Precaution Booklet Issued: Yes (comment) Restrictions Weight Bearing Restrictions: Yes RUE Weight Bearing: Non weight bearing      Mobility Bed Mobility Overal bed mobility: Needs Assistance Bed Mobility: Supine to Sit     Supine to sit: Min guard        Transfers Overall transfer level: Needs assistance   Transfers: Sit to/from Stand Sit to Stand: Min guard (slightly unsteady)              Balance Overall balance assessment: Needs assistance           Standing balance-Leahy Scale: Fair                              ADL Overall ADL's : Needs assistance/impaired Eating/Feeding: Set up   Grooming: Moderate assistance   Upper Body Bathing: Moderate assistance   Lower Body Bathing: Moderate assistance   Upper Body Dressing : Moderate assistance   Lower Body Dressing: Minimal assistance   Toilet Transfer: Min guard   Toileting- Clothing Manipulation and Hygiene: Min guard       Functional mobility during ADLs: Min guard       Vision     Perception      Praxis      Pertinent Vitals/Pain Pain Assessment: 0-10 Pain Score: 7  Pain Location: R shoulder Pain Descriptors / Indicators: Aching;Discomfort Pain Intervention(s): Limited activity within patient's tolerance;Ice applied;Repositioned     Hand Dominance Right   Extremity/Trunk Assessment Upper Extremity Assessment Upper Extremity Assessment: RUE deficits/detail RUE Deficits / Details: c/o numbness RUE Coordination: decreased fine motor;decreased gross motor   Lower Extremity Assessment Lower Extremity Assessment: Overall WFL for tasks assessed   Cervical / Trunk Assessment Cervical / Trunk Assessment: Kyphotic   Communication Communication Communication: HOH   Cognition Arousal/Alertness: Awake/alert Behavior During Therapy: WFL for tasks assessed/performed Overall Cognitive Status: No family/caregiver present to determine baseline cognitive functioning (? STM deficits)                     General Comments       Exercises Exercises: Shoulder     Shoulder Instructions Shoulder Instructions Donning/doffing shirt without moving shoulder: Moderate assistance Method for sponge bathing under operated UE: Minimal assistance Donning/doffing sling/immobilizer: Moderate assistance Correct positioning of sling/immobilizer: Moderate assistance ROM for elbow, wrist and digits of operated UE: Minimal assistance Sling wearing schedule (on at all times/off for ADL's): Minimal assistance Proper positioning of operated UE when showering: Minimal assistance Positioning of UE while sleeping: Minimal assistance    Home Living Family/patient expects to be discharged to:: Private residence Living Arrangements: Spouse/significant other;Children Available  Help at Discharge: Available 24 hours/day Type of Home: House Home Access: Ramped entrance     Home Layout: One level     Bathroom Shower/Tub: Occupational psychologist: Standard Bathroom Accessibility:  Yes How Accessible: Accessible via walker Home Equipment: Shower seat;Cane - single point          Prior Functioning/Environment Level of Independence: Independent             OT Diagnosis: Generalized weakness;Acute pain   OT Problem List: Decreased strength;Decreased range of motion;Decreased activity tolerance;Decreased coordination;Decreased knowledge of use of DME or AE;Decreased knowledge of precautions;Impaired UE functional use;Pain   OT Treatment/Interventions: Self-care/ADL training;Therapeutic exercise;DME and/or AE instruction;Therapeutic activities;Patient/family education    OT Goals(Current goals can be found in the care plan section) Acute Rehab OT Goals Patient Stated Goal: to go home OT Goal Formulation: With patient Time For Goal Achievement: 04/28/15 Potential to Achieve Goals: Good  OT Frequency: Min 2X/week   Barriers to D/C:            Co-evaluation              End of Session Nurse Communication: Mobility status;Precautions;Weight bearing status  Activity Tolerance: Patient tolerated treatment well Patient left: in chair;with call bell/phone within reach   Time: 0805-0840 OT Time Calculation (min): 35 min Charges:  OT General Charges $OT Visit: 1 Procedure OT Evaluation $OT Eval Moderate Complexity: 1 Procedure OT Treatments $Self Care/Home Management : 8-22 mins G-Codes:    Diem Pagnotta,HILLARY April 23, 2015, 10:13 AM   Maurie Boettcher, OTR/L  [phone] 04-23-2015

## 2015-04-22 NOTE — Progress Notes (Signed)
Occupational Therapy Treatment Patient Details Name: Andre Casey. MRN: [id number] DOB: May 01, 1929 Today's Date: 04/22/2015    History of present illness s/p R reverse TSA   OT comments  Completed education with pt/wife regarding ADL, management of RUE, HEP and home safety to reduce risk of falls. Pt needs to continue with home health to maximize functional level of independence.   Follow Up Recommendations  Home health OT;Supervision/Assistance - 24 hour    Equipment Recommendations  None recommended by OT    Recommendations for Other Services      Precautions / Restrictions Precautions Precautions: Shoulder Type of Shoulder Precautions: active; FF 0-140; Abd 0-60; ER 0-30 Shoulder Interventions: Shoulder sling/immobilizer;Off for dressing/bathing/exercises;For comfort Precaution Booklet Issued: Yes (comment) Restrictions RUE Weight Bearing: Non weight bearing       Mobility Bed Mobility                  Transfers       Sit to Stand: Supervision         General transfer comment: recommended pt use straight cane for ambulation    Balance                                   ADL                                         General ADL Comments: Comleted education with wife regarding ADL and managment of RUE. Educated on importance on keeping pt NWB. Stressed importance of keeping arm "infront" of patient and not attemtping to overuse RUE (i.e. button tight pants, or pull belt tightly. Pt verbalized understanding. Reviewed appropriate positioning of RUE in sitting and sleeping. Pt able to direct caregiver in care. Wfie expressed concerns stating she has had several concussions and has difficulty with her memory. wife reassured.       Vision                     Perception     Praxis      Cognition   Behavior During Therapy: [facility] for tasks assessed/performed Overall Cognitive Status: Within Functional Limits  for tasks assessed                       Extremity/Trunk Assessment               Exercises Shoulder Exercises Shoulder Flexion: AAROM;PROM;Right;10 reps;Seated (0-40) Shoulder ABduction: AAROM;PROM;Right;10 reps;Seated (0-20) Shoulder External Rotation: AAROM;Right;10 reps;Seated Elbow Flexion: AROM;Right;10 reps Elbow Extension: AROM;Right;10 reps;Seated Wrist Flexion: AROM;Right;10 reps Wrist Extension: AROM;Right;10 reps Digit Composite Flexion: AROM;Right;10 reps Composite Extension: AROM;Right;10 reps Donning/doffing shirt without moving shoulder: Caregiver independent with task;Patient able to independently direct caregiver Method for sponge bathing under operated UE: Caregiver independent with task;Patient able to independently direct caregiver Donning/doffing sling/immobilizer: Caregiver independent with task;Patient able to independently direct caregiver Correct positioning of sling/immobilizer: Caregiver independent with task;Patient able to independently direct caregiver ROM for elbow, wrist and digits of operated UE: Caregiver independent with task;Patient able to independently direct caregiver Sling wearing schedule (on at all times/off for ADL's): Caregiver independent with task;Patient able to independently direct caregiver Proper positioning of operated UE when showering: Caregiver independent with task;Patient able to independently direct caregiver Dressing change: Patient able to independently direct caregiver  Shoulder Instructions Shoulder Instructions Donning/doffing shirt without moving shoulder: Caregiver independent with task;Patient able to independently direct caregiver Method for sponge bathing under operated UE: Caregiver independent with task;Patient able to independently direct caregiver Donning/doffing sling/immobilizer: Caregiver independent with task;Patient able to independently direct caregiver Correct positioning of sling/immobilizer:  Caregiver independent with task;Patient able to independently direct caregiver ROM for elbow, wrist and digits of operated UE: Caregiver independent with task;Patient able to independently direct caregiver Sling wearing schedule (on at all times/off for ADL's): Caregiver independent with task;Patient able to independently direct caregiver Proper positioning of operated UE when showering: Caregiver independent with task;Patient able to independently direct caregiver Dressing change: Patient able to independently direct caregiver     General Comments      Pertinent Vitals/ Pain       Pain Assessment: 0-10 Pain Score: 5  Pain Location: R shoulder Pain Descriptors / Indicators: Aching;Discomfort Pain Intervention(s): Limited activity within patient's tolerance;Repositioned  Home Living                                          Prior Functioning/Environment              Frequency       Progress Toward Goals  OT Goals(current goals can now be found in the care plan section)  Progress towards OT goals: Goals met/education completed, patient discharged from OT (acute ot)  Acute Rehab OT Goals Patient Stated Goal: to go home OT Goal Formulation: With patient Time For Goal Achievement: 04/28/15 Potential to Achieve Goals: Good ADL Goals Pt Will Perform Upper Body Bathing: with min assist;with caregiver independent in assisting;sitting Pt Will Perform Upper Body Dressing: with min assist;with caregiver independent in assisting;sitting Pt Will Transfer to Toilet: with supervision;ambulating Pt Will Perform Toileting - Clothing Manipulation and hygiene: with supervision;with caregiver independent in assisting;sit to/from stand Pt/caregiver will Perform Home Exercise Program: Right Upper extremity;With written HEP provided;With Supervision Additional ADL Goal #1: Pt/wife independent with RUE management  in sitting/bed and sling management  Plan Discharge plan remains  appropriate    Co-evaluation                 End of Session     Activity Tolerance Patient tolerated treatment well   Patient Left in chair;with call bell/phone within reach;with family/visitor present   Nurse Communication Mobility status;Other (comment) (ready for D/C)        Time: 6286-3817 OT Time Calculation (min): 24 min  Charges: OT General Charges $OT Visit: 1 Procedure OT Treatments $Self Care/Home Management : 8-22 mins $Therapeutic Activity: 8-22 mins  Burnadette Baskett,HILLARY 04/22/2015, 3:45 PM   [facility], OTR/L  [phone] 04/22/2015

## 2015-04-22 NOTE — Progress Notes (Signed)
OT Cancellation Note  Patient Details Name: Andre Casey. MRN: [id number] DOB: Jan 14, 1930   Cancelled Treatment:    Reason Eval/Treat Not Completed: Other (comment) Attempted to see. Waiting on wife to arrive in order to complete education/session in order to D/C pt home safely.  [place], OTR/L  [phone] 04/22/2015 04/22/2015, 2:32 PM

## 2015-04-22 NOTE — Progress Notes (Signed)
Orthopedics Progress Note  Subjective: Patient feeling better this morning  Objective:  Filed Vitals:   04/21/15 2056 04/22/15 0613  BP: 102/57 119/59  Pulse: 64 79  Temp: 98.6 F (37 C) 101 F (38.3 C)  Resp: 19 18    General: Awake and alert  Musculoskeletal: right shoulder dressing CDI, no erythema Neurovascularly intact  Lab Results  Component Value Date   WBC 7.2 04/17/2015   HGB 14.3 04/17/2015   HCT 43.2 04/17/2015   MCV 90.4 04/17/2015   PLT 264 04/17/2015       Component Value Date/Time   NA 140 04/17/2015 0843   K 4.4 04/17/2015 0843   CL 104 04/17/2015 0843   CO2 26 04/17/2015 0843   GLUCOSE 112* 04/17/2015 0843   BUN 18 04/17/2015 0843   CREATININE 1.25* 04/17/2015 0843   CALCIUM 9.8 04/17/2015 0843   GFRNONAA 51* 04/17/2015 0843   GFRAA 60* 04/17/2015 0843    Lab Results  Component Value Date   INR 1.1 08/25/2008   INR 0.9 03/17/2007    Assessment/Plan: POD #2 s/p Procedure(s): RIGHT REVERSE TOTAL SHOULDER ARTHROPLASTY Low grade fever this morning.  No complaints.  Worked on Chiropodist. [place] for D/C to home today after therapy. F/U in two weeks  Doran Heater. Veverly Fells, MD 04/22/2015 7:15 AM

## 2015-04-22 NOTE — Care Management Note (Signed)
Case Management Note  Patient Details  Name: Andre Casey. MRN: [id number] Date of Birth: [birth year]  Subjective/Objective:                  Right reverse total shoulder arthroplasty  Action/Plan: CM spoke to patient at the bedside and offered patient choice for [facility] PT/OT and he chose [facility]. Patient and CM spoke about DME needs and he said no walker or BSC was needed. He said that he has a cane and that is all he needs. CM called Tiffany with [facility] to advise of referral and referral accepted. Patient said that his wife will be able to provide 24/7 support and did not feel that he had any additional needs at this time. CM remains available should additional needs arise.   Expected Discharge Date:  04/22/15              Expected Discharge Plan:  [place]  In-House Referral:     Discharge planning Services  CM Consult  Post Acute Care Choice:  Home Health Choice offered to:  Patient  DME Arranged:    DME Agency:     HH Arranged:  PT, OT HH Agency:  [place]  Status of Service:  Completed, signed off  Medicare Important Message Given:    Date Medicare IM Given:    Medicare IM give by:    Date Additional Medicare IM Given:    Additional Medicare Important Message give by:     If discussed at [place] of Stay Meetings, dates discussed:    Additional Comments:  Guido Sander, RN 04/22/2015, 11:23 AM

## 2015-04-23 ENCOUNTER — Encounter (HOSPITAL_COMMUNITY): Payer: Self-pay | Admitting: Orthopedic Surgery

## 2015-04-24 DIAGNOSIS — Z96611 Presence of right artificial shoulder joint: Secondary | ICD-10-CM | POA: Diagnosis not present

## 2015-04-24 DIAGNOSIS — Z471 Aftercare following joint replacement surgery: Secondary | ICD-10-CM | POA: Diagnosis not present

## 2015-04-27 DIAGNOSIS — Z96611 Presence of right artificial shoulder joint: Secondary | ICD-10-CM | POA: Diagnosis not present

## 2015-04-27 DIAGNOSIS — Z471 Aftercare following joint replacement surgery: Secondary | ICD-10-CM | POA: Diagnosis not present

## 2015-05-01 DIAGNOSIS — Z96611 Presence of right artificial shoulder joint: Secondary | ICD-10-CM | POA: Diagnosis not present

## 2015-05-01 DIAGNOSIS — Z471 Aftercare following joint replacement surgery: Secondary | ICD-10-CM | POA: Diagnosis not present

## 2015-05-04 DIAGNOSIS — Z471 Aftercare following joint replacement surgery: Secondary | ICD-10-CM | POA: Diagnosis not present

## 2015-05-04 DIAGNOSIS — Z96611 Presence of right artificial shoulder joint: Secondary | ICD-10-CM | POA: Diagnosis not present

## 2015-05-08 DIAGNOSIS — Z471 Aftercare following joint replacement surgery: Secondary | ICD-10-CM | POA: Diagnosis not present

## 2015-05-08 DIAGNOSIS — Z96611 Presence of right artificial shoulder joint: Secondary | ICD-10-CM | POA: Diagnosis not present

## 2015-05-10 DIAGNOSIS — M12811 Other specific arthropathies, not elsewhere classified, right shoulder: Secondary | ICD-10-CM | POA: Diagnosis not present

## 2015-05-14 DIAGNOSIS — M12811 Other specific arthropathies, not elsewhere classified, right shoulder: Secondary | ICD-10-CM | POA: Diagnosis not present

## 2015-05-17 DIAGNOSIS — M12811 Other specific arthropathies, not elsewhere classified, right shoulder: Secondary | ICD-10-CM | POA: Diagnosis not present

## 2015-05-21 DIAGNOSIS — M12811 Other specific arthropathies, not elsewhere classified, right shoulder: Secondary | ICD-10-CM | POA: Diagnosis not present

## 2015-05-24 DIAGNOSIS — M12811 Other specific arthropathies, not elsewhere classified, right shoulder: Secondary | ICD-10-CM | POA: Diagnosis not present

## 2015-05-28 DIAGNOSIS — M12811 Other specific arthropathies, not elsewhere classified, right shoulder: Secondary | ICD-10-CM | POA: Diagnosis not present

## 2015-05-31 DIAGNOSIS — M12811 Other specific arthropathies, not elsewhere classified, right shoulder: Secondary | ICD-10-CM | POA: Diagnosis not present

## 2015-06-07 DIAGNOSIS — M12811 Other specific arthropathies, not elsewhere classified, right shoulder: Secondary | ICD-10-CM | POA: Diagnosis not present

## 2015-06-14 DIAGNOSIS — Z471 Aftercare following joint replacement surgery: Secondary | ICD-10-CM | POA: Diagnosis not present

## 2015-06-14 DIAGNOSIS — Z96611 Presence of right artificial shoulder joint: Secondary | ICD-10-CM | POA: Diagnosis not present

## 2015-06-18 ENCOUNTER — Encounter: Payer: Self-pay | Admitting: Internal Medicine

## 2015-07-05 DIAGNOSIS — R413 Other amnesia: Secondary | ICD-10-CM | POA: Diagnosis not present

## 2015-07-18 ENCOUNTER — Ambulatory Visit (INDEPENDENT_AMBULATORY_CARE_PROVIDER_SITE_OTHER): Payer: Medicare Other | Admitting: Neurology

## 2015-07-18 ENCOUNTER — Encounter: Payer: Self-pay | Admitting: Neurology

## 2015-07-18 VITALS — BP 138/82 | HR 72 | Ht 66.0 in | Wt 136.8 lb

## 2015-07-18 DIAGNOSIS — D582 Other hemoglobinopathies: Secondary | ICD-10-CM | POA: Diagnosis not present

## 2015-07-18 DIAGNOSIS — R202 Paresthesia of skin: Secondary | ICD-10-CM | POA: Diagnosis not present

## 2015-07-18 DIAGNOSIS — G3184 Mild cognitive impairment, so stated: Secondary | ICD-10-CM | POA: Diagnosis not present

## 2015-07-18 MED ORDER — DONEPEZIL HCL 5 MG PO TABS: 5.0000 mg | ORAL_TABLET | Freq: Every day | ORAL | Status: DC

## 2015-07-18 NOTE — Progress Notes (Signed)
PATIENT: Andre Casey. DOB: [birth year]  Chief Complaint  Patient presents with  . Memory Loss    MMSE 30/30 - 23 animals. He is here with his wife, Andre Casey, to discuss his memory.  States he has been taking Namenda XR 28mg  daily for several years. However, he reports experiencing a sudden onset of worsening memory loss and confusion 2-3 weeks ago.  He was driving to his daughter's house when this occurred and he forgot the way.  It has remained a problem since that time.  He is now having to use his GPS to get to familiar places.     HISTORICAL  Andre Casey. is a 82 years old right-handed male, accompanied by his wife, seen in refer by his primary care physician Dr.  [name] for evaluation of memory loss in Jul 18 2015  I reviewed and summarized referring note, he had a history of prostate cancer, status post surgery in [name] in 89, with residual incontinence, hypertension, hyperlipidemia, osteoarthritis, recent right shoulder rotator cuff replacement in February 2017, recovered very well  He graduated from high school, used to work at [employer], retired at age 74, moved to [place] since 2003 to be with his wife, he is still active in his church activity  He was noted to have gradual onset memory loss since 2015, he was put on Aricept 10 mg, complains of severe GI side effect, take 3 pills, throw up 3 times, now he has been taking Namenda xr 28 mg every day since 2015, tolerating it well,  His memory loss gradually getting worse, he tends to repeat himself, in early May 2017, while driving to visit his daughter, he got loss, he often got lost driving a familiar route, rely on GPS now. He denies significant gait difficulty, sleeps well,  His father died at age 38 from stroke, mother suffered Alzheimer's disease at age 13.  I reviewed laboratory evaluation in 2017, normal CBC, hemoglobin 13 point 6, normal CMP, creatinine 1.15,  normal TSH 2.55  REVIEW OF SYSTEMS: Full 14 system review of systems performed and notable only for hearing loss, incontinence, joint pain  ALLERGIES: Allergies  Allergen Reactions  . Aricept [Donepezil Hcl] Other (See Comments)    Stomach upset    HOME MEDICATIONS: Current Outpatient Prescriptions  Medication Sig Dispense Refill  . acetaminophen (TYLENOL) 500 MG tablet Take 1,000 mg by mouth every 8 (eight) hours as needed for mild pain or headache.    Marland Kitchen aspirin 81 MG tablet Take 81 mg by mouth daily.    . Calcium Citrate-Vitamin D (CALCIUM + D PO) Take 1 tablet by mouth every other day.    . diclofenac sodium (VOLTAREN) 1 % GEL Apply 1 application topically 2 (two) times daily as needed.    Marland Kitchen FOLIC ACID PO Take by mouth daily.    Marland Kitchen lisinopril-hydrochlorothiazide (PRINZIDE,ZESTORETIC) 20-25 MG tablet Take 1 tablet by mouth daily.    . memantine (NAMENDA XR) 28 MG CP24 24 hr capsule Take 28 mg by mouth daily.    . Multiple Vitamins-Minerals (MULTIVITAMIN WITH MINERALS) tablet Take 1 tablet by mouth daily.    Marland Kitchen oxyCODONE-acetaminophen (ROXICET) 5-325 MG tablet Take 1-2 tablets by mouth every 4 (four) hours as needed for severe pain. 60 tablet 0  . potassium gluconate 595 (99 K) MG TABS tablet Take 595 mg by mouth every other day.    . Turmeric POWD 2.5 mLs by Does not apply route daily.  Mix with water and 2 squirts of lemon juice     No current facility-administered medications for this visit.    PAST MEDICAL HISTORY: Past Medical History  Diagnosis Date  . PONV (postoperative nausea and vomiting)     "nausea after a procedure"  . Hypertension   . Hypercholesteremia   . Cancer [facility])     history of prostate cancer  . History of radiation therapy   . Use of leuprolide acetate (Lupron)     history of lupron injections  . Urinary incontinence   . Arthritis   . Hard of hearing   . Numbness of toes     "right little toe"  . Memory impairment     takes Namenda    PAST  SURGICAL HISTORY: Past Surgical History  Procedure Laterality Date  . Other surgical history      thyroid goiter removal  . Prostatectomy    . Tonsillectomy    . Hernia repair    . Penile prosthesis implant    . Colonoscopy    . Shoulder surgery Left   . Reverse shoulder arthroplasty Right 04/20/2015    Procedure: RIGHT REVERSE TOTAL SHOULDER ARTHROPLASTY;  Surgeon: Netta Cedars, MD;  Location: [place];  Service: Orthopedics;  Laterality: Right;    FAMILY HISTORY: Family History  Problem Relation Age of Onset  . Hypertension Father   . CVA Father   . Hypertension Mother   . Dementia Mother   . COPD Sister     SOCIAL HISTORY:  Social History   Social History  . Marital Status: Married    Spouse Name: N/A  . Number of Children: 4  . Years of Education: HS   Occupational History  . Retired    Social History Main Topics  . Smoking status: Former Research scientist (life sciences)  . Smokeless tobacco: Never Used     Comment: "not smoked for 50 years"  . Alcohol Use: No  . Drug Use: No  . Sexual Activity: Not on file   Other Topics Concern  . Not on file   Social History Narrative   Lives at home with his wife.   Right-handed.   No caffeine use.     PHYSICAL EXAM   Filed Vitals:   07/18/15 0840  BP: 138/82  Pulse: 72  Height: 5\' 6"  (1.676 m)  Weight: 136 lb 12 oz (62.029 kg)    Not recorded      Body mass index is 22.08 kg/(m^2).  PHYSICAL EXAMNIATION:  Gen: NAD, conversant, well nourised, obese, well groomed                     Cardiovascular: Regular rate rhythm, no peripheral edema, warm, nontender. Eyes: Conjunctivae clear without exudates or hemorrhage Neck: Supple, no carotid bruise. Pulmonary: Clear to auscultation bilaterally   NEUROLOGICAL EXAM:  MENTAL STATUS: Speech:    Speech is normal; fluent and spontaneous with normal comprehension.  Cognition:Mini-Mental Status Examination 30/30, animal naming 23     Orientation to time, place and person     Normal  recent and remote memory     Normal Attention span and concentration     Normal Language, naming, repeating,spontaneous speech     Fund of knowledge   CRANIAL NERVES: CN II: Visual fields are full to confrontation. Fundoscopic exam is normal with sharp discs and no vascular changes. Pupils are round equal and briskly reactive to light. CN III, IV, VI: extraocular movement are normal. No ptosis. CN V:  Facial sensation is intact to pinprick in all 3 divisions bilaterally. Corneal responses are intact.  CN VII: Face is symmetric with normal eye closure and smile. CN VIII: Hearing is normal to rubbing fingers CN IX, X: Palate elevates symmetrically. Phonation is normal. CN XI: Head turning and shoulder shrug are intact CN XII: Tongue is midline with normal movements and no atrophy.  MOTOR: There is no pronator drift of out-stretched arms. Muscle bulk and tone are normal. Muscle strength is normal.  REFLEXES: Reflexes are 2+ and symmetric at the biceps, triceps, knees, and ankles. Plantar responses are flexor.  SENSORY: Mildly decreased light touch, pinprick at his toes   COORDINATION: Rapid alternating movements and fine finger movements are intact. There is no dysmetria on finger-to-nose and heel-knee-shin.    GAIT/STANCE: Posture is normal. Gait is steady with normal steps, base, arm swing, and turning. Heel and toe walking are normal. Tandem gait is normal.  Romberg is absent.   DIAGNOSTIC DATA (LABS, IMAGING, TESTING) - I reviewed patient records, labs, notes, testing and imaging myself where available.   ASSESSMENT AND PLAN  [place] Stoneburner. is a 82 y.o. 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 Ph.D.  [facility] [address], [place], [place] [address] Ph: [phone] Fax: [phone]  CC: [name], MD

## 2015-07-19 ENCOUNTER — Telehealth: Payer: Self-pay | Admitting: *Deleted

## 2015-07-19 LAB — VITAMIN B12: Vitamin B-12: 374 pg/mL (ref 211–946)

## 2015-07-19 LAB — HGB A1C W/O EAG: Hgb A1c MFr Bld: 5.9 % — ABNORMAL HIGH (ref 4.8–5.6)

## 2015-07-19 NOTE — Telephone Encounter (Signed)
Per Dr Krista Blue, spoke with patient and informed him his Hgb A1c is mildly elevated. He should exercise such as walking, use diet control particularly with sugars, carbohydrates, starches and portion control. He stated "In other words, I should do what I'm supposed to do." Advised his vitamin B12 level is normal. He verbalized understanding, appreciation.

## 2015-07-21 ENCOUNTER — Ambulatory Visit
Admission: RE | Admit: 2015-07-21 | Discharge: 2015-07-21 | Disposition: A | Discharge status: Home / Self Care | Payer: Medicare Other | Source: Ambulatory Visit | Attending: Neurology | Admitting: Neurology

## 2015-07-21 DIAGNOSIS — G3184 Mild cognitive impairment, so stated: Secondary | ICD-10-CM | POA: Diagnosis not present

## 2015-07-24 ENCOUNTER — Telehealth: Payer: Self-pay | Admitting: *Deleted

## 2015-07-24 NOTE — Telephone Encounter (Signed)
Left message on home and cell numbers.

## 2015-07-24 NOTE — Telephone Encounter (Signed)
-----   Message from Marcial Pacas, MD sent at 07/24/2015  3:02 PM EDT ----- Please call patient MRI of the brain showed age related changes, I will review MRI findings with him in detail at his next follow-up visit.

## 2015-07-25 NOTE — Telephone Encounter (Signed)
Returned call to patient and provided results.

## 2015-07-25 NOTE — Telephone Encounter (Addendum)
Patient is returning your call. He has questions and can be reached at [phone].

## 2015-07-25 NOTE — Telephone Encounter (Signed)
Attempted patient again - unable to reach - left detailed message on his home number (ok per DPR).  Also, left our number to call back with any questions and reminded him to keep his follow up appt on 08/23/15.

## 2015-07-26 ENCOUNTER — Other Ambulatory Visit: Payer: Medicare Other

## 2015-07-31 ENCOUNTER — Telehealth: Payer: Self-pay | Admitting: *Deleted

## 2015-07-31 NOTE — Telephone Encounter (Addendum)
Left message for a return call.  Need to get his follow up appt rescheduled.  Dr. Krista Blue is going to be out of the office on 08/06/15.

## 2015-08-01 NOTE — Telephone Encounter (Signed)
Spoke to patient - appt rescheduled.

## 2015-08-06 ENCOUNTER — Ambulatory Visit: Payer: Self-pay | Admitting: Neurology

## 2015-08-09 ENCOUNTER — Telehealth: Payer: Self-pay

## 2015-08-09 MED ORDER — DONEPEZIL HCL 5 MG PO TABS: 5.0000 mg | ORAL_TABLET | Freq: Every day | ORAL | Status: DC

## 2015-08-09 NOTE — Telephone Encounter (Signed)
Refill request from mail order pharmacy.

## 2015-08-13 DIAGNOSIS — C61 Malignant neoplasm of prostate: Secondary | ICD-10-CM | POA: Diagnosis not present

## 2015-08-13 DIAGNOSIS — N393 Stress incontinence (female) (male): Secondary | ICD-10-CM | POA: Diagnosis not present

## 2015-08-13 DIAGNOSIS — N5231 Erectile dysfunction following radical prostatectomy: Secondary | ICD-10-CM | POA: Diagnosis not present

## 2015-08-13 DIAGNOSIS — R9721 Rising PSA following treatment for malignant neoplasm of prostate: Secondary | ICD-10-CM | POA: Diagnosis not present

## 2015-08-13 DIAGNOSIS — R972 Elevated prostate specific antigen [PSA]: Secondary | ICD-10-CM | POA: Diagnosis not present

## 2015-08-23 ENCOUNTER — Ambulatory Visit (INDEPENDENT_AMBULATORY_CARE_PROVIDER_SITE_OTHER): Payer: Medicare Other | Admitting: Neurology

## 2015-08-23 ENCOUNTER — Ambulatory Visit: Payer: Medicare Other | Admitting: Neurology

## 2015-08-23 ENCOUNTER — Telehealth: Payer: Self-pay | Admitting: Neurology

## 2015-08-23 ENCOUNTER — Encounter: Payer: Self-pay | Admitting: Neurology

## 2015-08-23 VITALS — BP 147/84 | HR 65 | Ht 66.0 in | Wt 136.5 lb

## 2015-08-23 DIAGNOSIS — G3184 Mild cognitive impairment, so stated: Secondary | ICD-10-CM | POA: Diagnosis not present

## 2015-08-23 MED ORDER — MEMANTINE HCL-DONEPEZIL HCL ER 28-10 MG PO CP24: 1.0000 | ORAL_CAPSULE | Freq: Every day | ORAL | Status: DC

## 2015-08-23 MED ORDER — DONEPEZIL HCL 10 MG PO TABS: 10.0000 mg | ORAL_TABLET | Freq: Every day | ORAL | Status: DC
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
## 2015-08-23 NOTE — Telephone Encounter (Signed)
Would you please call his [facility] to cancel his Aricept order, keep Namzaric one tab everyday

## 2015-08-23 NOTE — Telephone Encounter (Addendum)
Called and spoke to Fleming at [facility] at [phone] (they have him listed as [place] [place]. Mosie Epstein) - they will fill Namzaric and void his Aricept refills.

## 2015-08-30 ENCOUNTER — Telehealth: Payer: Self-pay | Admitting: Neurology

## 2015-08-30 NOTE — Telephone Encounter (Signed)
Pt called said he took Memantine HCl-Donepezil HCl (NAMZARIC) 28-10 MG CP24 and lisinopril-hydrochlorothiazide (PRINZIDE,ZESTORETIC) 20-25 MG tablet this morning, he got nauseated and threw up. He thinks he should only take Memantine HCl-Donepezil HCl (NAMZARIC) 28-10 MG CP24 . Please call to confirm correct medications.

## 2015-08-30 NOTE — Telephone Encounter (Signed)
Spoke to patient - he actually took Namzaric along with Aricept and Namenda.  Says it was an error on his part - he forgot to remove the discontinued medications from his pill box.  He is aware that Namzaric is the replacement to his other two memory medications.  He has already corrected mistake.  Says he is feeling better now.

## 2015-09-04 DIAGNOSIS — R252 Cramp and spasm: Secondary | ICD-10-CM | POA: Diagnosis not present

## 2015-09-04 DIAGNOSIS — G4730 Sleep apnea, unspecified: Secondary | ICD-10-CM | POA: Diagnosis not present

## 2015-09-04 DIAGNOSIS — I10 Essential (primary) hypertension: Secondary | ICD-10-CM | POA: Diagnosis not present

## 2015-09-04 DIAGNOSIS — F0390 Unspecified dementia without behavioral disturbance: Secondary | ICD-10-CM | POA: Diagnosis not present

## 2015-09-08 DIAGNOSIS — G4730 Sleep apnea, unspecified: Secondary | ICD-10-CM | POA: Diagnosis not present

## 2015-09-08 DIAGNOSIS — R0689 Other abnormalities of breathing: Secondary | ICD-10-CM | POA: Diagnosis not present

## 2015-10-10 DIAGNOSIS — F0390 Unspecified dementia without behavioral disturbance: Secondary | ICD-10-CM | POA: Diagnosis not present

## 2015-10-10 DIAGNOSIS — R110 Nausea: Secondary | ICD-10-CM | POA: Diagnosis not present

## 2015-11-01 DIAGNOSIS — I10 Essential (primary) hypertension: Secondary | ICD-10-CM | POA: Diagnosis not present

## 2015-11-01 DIAGNOSIS — Z23 Encounter for immunization: Secondary | ICD-10-CM | POA: Diagnosis not present

## 2015-11-01 DIAGNOSIS — F0390 Unspecified dementia without behavioral disturbance: Secondary | ICD-10-CM | POA: Diagnosis not present

## 2015-11-01 DIAGNOSIS — M150 Primary generalized (osteo)arthritis: Secondary | ICD-10-CM | POA: Diagnosis not present

## 2015-11-01 DIAGNOSIS — C61 Malignant neoplasm of prostate: Secondary | ICD-10-CM | POA: Diagnosis not present

## 2015-11-01 DIAGNOSIS — R159 Full incontinence of feces: Secondary | ICD-10-CM | POA: Diagnosis not present

## 2015-11-01 DIAGNOSIS — Z0000 Encounter for general adult medical examination without abnormal findings: Secondary | ICD-10-CM | POA: Diagnosis not present

## 2015-11-01 DIAGNOSIS — R32 Unspecified urinary incontinence: Secondary | ICD-10-CM | POA: Diagnosis not present

## 2015-11-01 DIAGNOSIS — K219 Gastro-esophageal reflux disease without esophagitis: Secondary | ICD-10-CM | POA: Diagnosis not present

## 2015-11-01 DIAGNOSIS — G4700 Insomnia, unspecified: Secondary | ICD-10-CM | POA: Diagnosis not present

## 2015-11-29 DIAGNOSIS — M25569 Pain in unspecified knee: Secondary | ICD-10-CM | POA: Diagnosis not present

## 2015-12-01 ENCOUNTER — Emergency Department (HOSPITAL_COMMUNITY): Payer: Medicare Other

## 2015-12-01 ENCOUNTER — Encounter (HOSPITAL_COMMUNITY): Payer: Self-pay | Admitting: *Deleted

## 2015-12-01 ENCOUNTER — Other Ambulatory Visit: Payer: Self-pay

## 2015-12-01 ENCOUNTER — Emergency Department (HOSPITAL_COMMUNITY)
Admission: EM | Admit: 2015-12-01 | Discharge: 2015-12-01 | Disposition: A | Discharge status: Home / Self Care | Payer: Medicare Other | Attending: Emergency Medicine | Admitting: Emergency Medicine

## 2015-12-01 DIAGNOSIS — Z87891 Personal history of nicotine dependence: Secondary | ICD-10-CM | POA: Diagnosis not present

## 2015-12-01 DIAGNOSIS — Z7982 Long term (current) use of aspirin: Secondary | ICD-10-CM | POA: Insufficient documentation

## 2015-12-01 DIAGNOSIS — R072 Precordial pain: Secondary | ICD-10-CM | POA: Insufficient documentation

## 2015-12-01 DIAGNOSIS — Z96611 Presence of right artificial shoulder joint: Secondary | ICD-10-CM | POA: Insufficient documentation

## 2015-12-01 DIAGNOSIS — I10 Essential (primary) hypertension: Secondary | ICD-10-CM | POA: Diagnosis not present

## 2015-12-01 DIAGNOSIS — R079 Chest pain, unspecified: Secondary | ICD-10-CM | POA: Diagnosis not present

## 2015-12-01 DIAGNOSIS — Z79899 Other long term (current) drug therapy: Secondary | ICD-10-CM | POA: Insufficient documentation

## 2015-12-01 DIAGNOSIS — R0789 Other chest pain: Secondary | ICD-10-CM

## 2015-12-01 DIAGNOSIS — Z8546 Personal history of malignant neoplasm of prostate: Secondary | ICD-10-CM | POA: Diagnosis not present

## 2015-12-01 LAB — BASIC METABOLIC PANEL
ANION GAP: 11 (ref 5–15)
BUN: 25 mg/dL — ABNORMAL HIGH (ref 6–20)
CALCIUM: 9.7 mg/dL (ref 8.9–10.3)
CO2: 23 mmol/L (ref 22–32)
CREATININE: 1.26 mg/dL — AB (ref 0.61–1.24)
Chloride: 104 mmol/L (ref 101–111)
GFR, EST AFRICAN AMERICAN: 59 mL/min — AB (ref >60)
GFR, EST NON AFRICAN AMERICAN: 51 mL/min — AB (ref >60)
Glucose, Bld: 161 mg/dL — ABNORMAL HIGH (ref 65–99)
Potassium: 3.6 mmol/L (ref 3.5–5.1)
SODIUM: 138 mmol/L (ref 135–145)

## 2015-12-01 LAB — CBC
HCT: 42.8 % (ref 39.0–52.0)
Hemoglobin: 14.4 g/dL (ref 13.0–17.0)
MCH: 30.4 pg (ref 26.0–34.0)
MCHC: 33.6 g/dL (ref 30.0–36.0)
MCV: 90.3 fL (ref 78.0–100.0)
PLATELETS: 310 10*3/uL (ref 150–400)
RBC: 4.74 MIL/uL (ref 4.22–5.81)
RDW: 13.2 % (ref 11.5–15.5)
WBC: 6.8 10*3/uL (ref 4.0–10.5)

## 2015-12-01 LAB — TROPONIN I

## 2015-12-01 NOTE — ED Provider Notes (Signed)
[facility] DEPT Provider Note   CSN: [id number] Arrival date & time: 12/01/15  1532     History   Chief Complaint Chief Complaint  Patient presents with  . Chest Pain    HPI Andre Casey. is a 82 y.o. male.  Patient c/o episodes of mid chest pain in past day, each lasting approximately 5-10 seconds, at rest. Was shar, non radiating. Mild-mod. Patient denies more prolonged episodes of chest pain, or exertional chest pain. Denies any chest discomfort at all in the past few hours. No cough or uri c/o. No fever or chills. No sob. No associated nv or diaphoresis. Denies hx cad. No leg pain or swelling. No constant, persistent or pleuritic pain.     Chest Pain   Pertinent negatives include no abdominal pain, no back pain, no cough, no fever, no headaches, no palpitations, no shortness of breath and no vomiting.    Past Medical History:  Diagnosis Date  . Arthritis   . Cancer [facility])    history of prostate cancer  . Hard of hearing   . History of radiation therapy   . Hypercholesteremia   . Hypertension   . Memory impairment    takes Namenda  . Numbness of toes    "right little toe"  . PONV (postoperative nausea and vomiting)    "nausea after a procedure"  . Urinary incontinence   . Use of leuprolide acetate (Lupron)    history of lupron injections    Patient Active Problem List   Diagnosis Date Noted  . Mild cognitive impairment 07/18/2015  . Paresthesia 07/18/2015  . S/P shoulder replacement 04/20/2015  . HEMORRHOIDS-EXTERNAL 08/02/2007  . RECTAL BLEEDING 08/02/2007  . INCONTINENCE, FECAL 08/02/2007  . BLOOD IN STOOL, OCCULT 08/02/2007  . PERSONAL HX COLONIC POLYPS 08/02/2007    Past Surgical History:  Procedure Laterality Date  . COLONOSCOPY    . HERNIA REPAIR    . OTHER SURGICAL HISTORY     thyroid goiter removal  . PENILE PROSTHESIS IMPLANT    . PROSTATECTOMY    . REVERSE SHOULDER ARTHROPLASTY Right 04/20/2015   Procedure: RIGHT REVERSE  TOTAL SHOULDER ARTHROPLASTY;  Surgeon: Netta Cedars, MD;  Location: [facility];  Service: Orthopedics;  Laterality: Right;  . SHOULDER SURGERY Left   . TONSILLECTOMY         Home Medications    Prior to Admission medications   Medication Sig Start Date End Date Taking? Authorizing Provider  acetaminophen (TYLENOL) 500 MG tablet Take 1,000 mg by mouth every 8 (eight) hours as needed for mild pain or headache.    Historical Provider, MD  aspirin 81 MG tablet Take 81 mg by mouth daily.    Historical Provider, MD  Calcium Citrate-Vitamin D (CALCIUM + D PO) Take 1 tablet by mouth every other day.    Historical Provider, MD  diclofenac sodium (VOLTAREN) 1 % GEL Apply 1 application topically 2 (two) times daily as needed.    Historical Provider, MD  FOLIC ACID PO Take by mouth daily.    Historical Provider, MD  lisinopril-hydrochlorothiazide (PRINZIDE,ZESTORETIC) 20-25 MG tablet Take 1 tablet by mouth daily.    Historical Provider, MD  Memantine HCl-Donepezil HCl (NAMZARIC) 28-10 MG CP24 Take 1 tablet by mouth daily. 08/23/15   Marcial Pacas, MD  Multiple Vitamins-Minerals (MULTIVITAMIN WITH MINERALS) tablet Take 1 tablet by mouth daily.    Historical Provider, MD  oxyCODONE-acetaminophen (ROXICET) 5-325 MG tablet Take 1-2 tablets by mouth every 4 (four) hours as needed  for severe pain. 04/20/15   Netta Cedars, MD  potassium gluconate 595 (99 K) MG TABS tablet Take 595 mg by mouth every other day.    Historical Provider, MD  Turmeric POWD 2.5 mLs by Does not apply route daily. Mix with water and 2 squirts of lemon juice    Historical Provider, MD    Family History Family History  Problem Relation Age of Onset  . Hypertension Father   . CVA Father   . Hypertension Mother   . Dementia Mother   . COPD Sister     Social History Social History  Substance Use Topics  . Smoking status: Former Research scientist (life sciences)  . Smokeless tobacco: Never Used     Comment: "not smoked for 50 years"  . Alcohol use No      Allergies   Aricept [donepezil hcl]   Review of Systems Review of Systems  Constitutional: Negative for fever.  HENT: Negative for sore throat.   Eyes: Negative for redness.  Respiratory: Negative for cough and shortness of breath.   Cardiovascular: Positive for chest pain. Negative for palpitations and leg swelling.  Gastrointestinal: Negative for abdominal pain and vomiting.  Genitourinary: Negative for flank pain.  Musculoskeletal: Negative for back pain.  Skin: Negative for rash.  Neurological: Negative for headaches.  Psychiatric/Behavioral: Negative for confusion.     Physical Exam Updated Vital Signs BP 121/82 (BP Location: Right Arm)   Pulse 62   Temp 98.4 F (36.9 C)   Resp 14   Ht 5\' 7"  (1.702 m)   Wt 70.8 kg   SpO2 99%   BMI 24.43 kg/m   Physical Exam  Constitutional: He appears well-developed and well-nourished. No distress.  HENT:  Head: Atraumatic.  Eyes: Conjunctivae are normal.  Neck: Neck supple. No tracheal deviation present.  Cardiovascular: Normal rate, regular rhythm, normal heart sounds and intact distal pulses.  Exam reveals no gallop and no friction rub.   No murmur heard. Pulmonary/Chest: Effort normal and breath sounds normal. No accessory muscle usage. No respiratory distress. He exhibits no tenderness.  Abdominal: Soft. He exhibits no distension. There is no tenderness.  Musculoskeletal: He exhibits no edema or tenderness.  Neurological: He is alert.  Skin: Skin is warm and dry. No rash noted. He is not diaphoretic.  Psychiatric: He has a normal mood and affect.  Nursing note and vitals reviewed.    ED Treatments / Results  Labs (all labs ordered are listed, but only abnormal results are displayed) Results for orders placed or performed during the hospital encounter of [id number]  Basic metabolic panel  Result Value Ref Range   Sodium 138 135 - 145 mmol/L   Potassium 3.6 3.5 - 5.1 mmol/L   Chloride 104 101 - 111 mmol/L    CO2 23 22 - 32 mmol/L   Glucose, Bld 161 (H) 65 - 99 mg/dL   BUN 25 (H) 6 - 20 mg/dL   Creatinine, Ser 1.26 (H) 0.61 - 1.24 mg/dL   Calcium 9.7 8.9 - 10.3 mg/dL   GFR calc non Af Amer 51 (L) >60 mL/min   GFR calc Af Amer 59 (L) >60 mL/min   Anion gap 11 5 - 15  CBC  Result Value Ref Range   WBC 6.8 4.0 - 10.5 K/uL   RBC 4.74 4.22 - 5.81 MIL/uL   Hemoglobin 14.4 13.0 - 17.0 g/dL   HCT 42.8 39.0 - 52.0 %   MCV 90.3 78.0 - 100.0 fL   MCH 30.4 26.0 -  34.0 pg   MCHC 33.6 30.0 - 36.0 g/dL   RDW 13.2 11.5 - 15.5 %   Platelets 310 150 - 400 K/uL  Troponin I  Result Value Ref Range   Troponin I <0.03 <0.03 ng/mL   Dg Chest 2 View  Result Date: 12/01/2015 CLINICAL DATA:  Pt reports intermittent mid-sternal chest pain for over 1 week that has become more frequent; he reports he slipped down the wet porch steps today and grabbed the porch rail with both arms and the pain became worse; he reports h/o HTN; former smoker EXAM: CHEST  2 VIEW COMPARISON:  08/24/2008 FINDINGS: Cardiac silhouette is normal in size and configuration. The aorta is mildly uncoiled. No mediastinal or hilar masses.  No evidence of adenopathy. Lungs are clear.  No pleural effusion or pneumothorax. Patient has a right reverse shoulder prosthesis, incompletely imaged, but appearing well aligned. IMPRESSION: No active cardiopulmonary disease. Electronically Signed   By: Lajean Manes M.D.   On: 12/01/2015 17:36    EKG  EKG Interpretation  Date/Time:  Saturday December 01 2015 15:38:36 EDT Ventricular Rate:  80 PR Interval:  140 QRS Duration: 96 QT Interval:  392 QTC Calculation: 452 R Axis:   -69 Text Interpretation:  Normal sinus rhythm Incomplete right bundle branch block Left anterior fascicular block Confirmed by Ashok Cordia  MD, Lennette Bihari ([id number]) on 12/01/2015 3:55:44 PM       Radiology Dg Chest 2 View  Result Date: 12/01/2015 CLINICAL DATA:  Pt reports intermittent mid-sternal chest pain for over 1 week that has become  more frequent; he reports he slipped down the wet porch steps today and grabbed the porch rail with both arms and the pain became worse; he reports h/o HTN; former smoker EXAM: CHEST  2 VIEW COMPARISON:  08/24/2008 FINDINGS: Cardiac silhouette is normal in size and configuration. The aorta is mildly uncoiled. No mediastinal or hilar masses.  No evidence of adenopathy. Lungs are clear.  No pleural effusion or pneumothorax. Patient has a right reverse shoulder prosthesis, incompletely imaged, but appearing well aligned. IMPRESSION: No active cardiopulmonary disease. Electronically Signed   By: Lajean Manes M.D.   On: 12/01/2015 17:36    Procedures Procedures (including critical care time)  Medications Ordered in ED Medications - No data to display   Initial Impression / Assessment and Plan / ED Course  I have reviewed the triage vital signs and the nursing notes.  Pertinent labs & imaging results that were available during my care of the patient were reviewed by me and considered in my medical decision making (see chart for details).  Clinical Course    Patients symptoms very atypical, lasting only a few seconds - does not sound c/w acs. ecg without acute change. cxr neg. Trop neg.  Patient remains symptom free, and has been symptom free for the past few hours.  Patient current appears stable for d/c.     Final Clinical Impressions(s) / ED Diagnoses   Final diagnoses:  None    New Prescriptions New Prescriptions   No medications on file     Lajean Saver, MD 12/01/15 2052

## 2015-12-01 NOTE — Discharge Instructions (Signed)
It was our pleasure to provide your ER care today - we hope that you feel better.  Your lab tests, heart enzyme levels and ecg look good/normal.  Follow up with your doctor in the coming week.  Return to ER if worse, trouble breathing, recurrent or persistent chest pain, weak/fainting, other concern.

## 2015-12-01 NOTE — ED Triage Notes (Signed)
The pt has had central chest pain for mover one wseek  He saw a doctor who told him to come here.  No chest pain at presentg

## 2016-01-07 ENCOUNTER — Telehealth: Payer: Self-pay | Admitting: Cardiology

## 2016-01-07 DIAGNOSIS — R454 Irritability and anger: Secondary | ICD-10-CM | POA: Diagnosis not present

## 2016-01-07 DIAGNOSIS — R079 Chest pain, unspecified: Secondary | ICD-10-CM | POA: Diagnosis not present

## 2016-01-07 NOTE — Telephone Encounter (Signed)
[facility] 01/10/16 with [name]. Records given to Nenita h (medical records) CN

## 2016-01-08 DIAGNOSIS — I160 Hypertensive urgency: Secondary | ICD-10-CM | POA: Insufficient documentation

## 2016-01-08 DIAGNOSIS — E785 Hyperlipidemia, unspecified: Secondary | ICD-10-CM | POA: Insufficient documentation

## 2016-01-08 DIAGNOSIS — I10 Essential (primary) hypertension: Secondary | ICD-10-CM | POA: Insufficient documentation

## 2016-01-10 ENCOUNTER — Encounter: Payer: Self-pay | Admitting: Internal Medicine

## 2016-01-10 ENCOUNTER — Encounter: Payer: Self-pay | Admitting: Cardiology

## 2016-01-10 ENCOUNTER — Ambulatory Visit (INDEPENDENT_AMBULATORY_CARE_PROVIDER_SITE_OTHER): Payer: Medicare Other | Admitting: Cardiology

## 2016-01-10 VITALS — BP 150/84 | HR 62 | Ht 67.0 in | Wt 140.6 lb

## 2016-01-10 DIAGNOSIS — G3184 Mild cognitive impairment, so stated: Secondary | ICD-10-CM

## 2016-01-10 DIAGNOSIS — R5383 Other fatigue: Secondary | ICD-10-CM

## 2016-01-10 DIAGNOSIS — Z01818 Encounter for other preprocedural examination: Secondary | ICD-10-CM | POA: Diagnosis not present

## 2016-01-10 DIAGNOSIS — E785 Hyperlipidemia, unspecified: Secondary | ICD-10-CM

## 2016-01-10 DIAGNOSIS — D689 Coagulation defect, unspecified: Secondary | ICD-10-CM

## 2016-01-10 DIAGNOSIS — R079 Chest pain, unspecified: Secondary | ICD-10-CM | POA: Diagnosis not present

## 2016-01-10 DIAGNOSIS — Z8546 Personal history of malignant neoplasm of prostate: Secondary | ICD-10-CM

## 2016-01-10 DIAGNOSIS — Z8241 Family history of sudden cardiac death: Secondary | ICD-10-CM

## 2016-01-10 DIAGNOSIS — I200 Unstable angina: Secondary | ICD-10-CM | POA: Insufficient documentation

## 2016-01-10 MED ORDER — NITROGLYCERIN 0.4 MG SL SUBL: 0.4000 mg | SUBLINGUAL_TABLET | SUBLINGUAL | 0 refills | Status: DC | PRN

## 2016-01-10 NOTE — Assessment & Plan Note (Signed)
[name] father died at 61

## 2016-01-10 NOTE — Assessment & Plan Note (Signed)
controlled 

## 2016-01-10 NOTE — Assessment & Plan Note (Signed)
New onset of exertional chest pain consistent with angina

## 2016-01-10 NOTE — Progress Notes (Signed)
01/10/2016 [name].   December 09, 1929  [id number]  Primary Physician [name], MD Primary Cardiologist: Dr Stanford Breed (new)  HPI:  Andre Casey 82 y/o male with a history of HTN, untreated HLD, prostate cancer, and mild cognitive impairment (short term memory) seen in the office today as a referral from Dr Andre Casey for chest pain. The pt had seen Dr Rex Kras in 2010 after an episode of chest pain. A nuclear stress then was negative. He has not had any cardiac issues until recently when he noted exertional chest pain "tightness".  He went to the ED 12/01/15 for this. This is not associated with nausea, vomiting, or diaphoresis. His symptoms then were atypical lasting only "seconds" but he now says he has chest pain with exertion that lasts 5 minutes or so. He had chest discomfort earlier while "rushing to get ready". He tells me if he were to walk dow the hall he would probably has chest pain. His symptoms are relieved with rest. He is in the office with his wife Andre Casey.     Current Outpatient Prescriptions  Medication Sig Dispense Refill  . acetaminophen (TYLENOL) 500 MG tablet Take 1,000 mg by mouth every 8 (eight) hours as needed for mild pain or headache.    Marland Kitchen aspirin 81 MG tablet Take 81 mg by mouth daily.    Marland Kitchen buPROPion (WELLBUTRIN XL) 150 MG 24 hr tablet Take 150 mg by mouth daily.    . Calcium Citrate-Vitamin D (CALCIUM + D PO) Take 1 tablet by mouth every other day.    . diclofenac sodium (VOLTAREN) 1 % GEL Apply 1 application topically 2 (two) times daily as needed.    Marland Kitchen FOLIC ACID PO Take by mouth daily.    Marland Kitchen lisinopril-hydrochlorothiazide (PRINZIDE,ZESTORETIC) 20-25 MG tablet Take 1 tablet by mouth daily.    Marland Kitchen loperamide (IMODIUM) 1 MG/5ML solution Take 1 mg by mouth as needed for diarrhea or loose stools.    . Multiple Vitamins-Minerals (MULTIVITAMIN WITH MINERALS) tablet Take 1 tablet by mouth daily.    Marland Kitchen oxyCODONE-acetaminophen (ROXICET) 5-325 MG tablet Take 1-2 tablets  by mouth every 4 (four) hours as needed for severe pain. 60 tablet 0  . potassium gluconate 595 (99 K) MG TABS tablet Take 595 mg by mouth every other day.    . Turmeric POWD 2.5 mLs by Does not apply route daily. Mix with water and 2 squirts of lemon juice    . zolpidem (AMBIEN) 10 MG tablet Take 5 mg by mouth daily.    . nitroGLYCERIN (NITROSTAT) 0.4 MG SL tablet Place 1 tablet (0.4 mg total) under the tongue every 5 (five) minutes as needed for chest pain. 75 tablet 0   No current facility-administered medications for this visit.     Allergies  Allergen Reactions  . Aricept [Donepezil Hcl] Other (See Comments)    Stomach upset    Social History   Social History  . Marital status: Married    Spouse name: N/A  . Number of children: 4  . Years of education: HS   Occupational History  . Retired    Social History Main Topics  . Smoking status: Former Research scientist (life sciences)  . Smokeless tobacco: Never Used     Comment: "not smoked for 50 years"  . Alcohol use No  . Drug use: No  . Sexual activity: Not on file   Other Topics Concern  . Not on file   Social History Narrative   Lives at home with his wife.  Right-handed.   No caffeine use.     Past Medical History:  Diagnosis Date  . Arthritis   . Cancer [facility])    history of prostate cancer  . Chest pain   . Hard of hearing   . History of radiation therapy   . Hypercholesteremia   . Hypertension   . Memory impairment    takes Namenda  . Numbness of toes    "right little toe"  . Urinary incontinence   . Use of leuprolide acetate (Lupron)    history of lupron injections   Family History  Problem Relation Age of Onset  . Hypertension Father   . CVA Father   . Hypertension Mother   . Dementia Mother   . COPD Sister     Review of Systems: General: negative for chills, fever, night sweats or weight changes.  Cardiovascular: negative for dyspnea on exertion, edema, orthopnea, palpitations, paroxysmal nocturnal dyspnea or  shortness of breath Dermatological: negative for rash Respiratory: negative for cough or wheezing Urologic: negative for hematuria Abdominal: negative for nausea, vomiting, diarrhea, bright red blood per rectum, melena, or hematemesis Neurologic: negative for visual changes, syncope, or dizziness All other systems reviewed and are otherwise negative except as noted above.    Blood pressure (!) 150/84, Casey 62, height 5\' 7"  (1.702 m), weight 140 lb 9.6 oz (63.8 kg).  General appearance: alert, cooperative and no distress Neck: no carotid bruit and no JVD Lungs: clear to auscultation bilaterally Heart: regular rate and rhythm Abdomen: soft, non-tender; bowel sounds normal; no masses,  no organomegaly Urol: history of urinary incontinence and radiation proctitis Extremities: extremities normal, atraumatic, no cyanosis or edema Pulses: 2+ and symmetric Skin: Skin color, texture, turgor normal. No rashes or lesions Neurologic: Grossly normal  EKG [id number]  ASSESSMENT AND PLAN:   Unstable angina (HCC) New onset of exertional chest pain consistent with angina  Essential hypertension controlled  Dyslipidemia Last LDL was 143 2016-not on statin rx  Mild cognitive impairment Followed by Neurology  History of [place] cancer Surgery in '92 in [place]  Family history of sudden cardiac death [name] father died at 50   PLAN  Pt seen by Dr Stanford Breed and myself. His symptoms are worrisome for angina. We have added an ASA and NTG and will arrange for an OP coronary angiogram ASAP.  The patient understands that risks included but are not limited to stroke (1 in 1000), death (1 in 75), kidney failure [usually temporary] (1 in 500), bleeding (1 in 200), allergic reaction [possibly serious] (1 in 200).  The patient understands and agrees to proceed.   [name] PA-C 01/10/2016 4:40 PM

## 2016-01-10 NOTE — Assessment & Plan Note (Signed)
-  Followed by Neurology. °

## 2016-01-10 NOTE — Addendum Note (Signed)
Addended by: Erlene Quan on: 01/10/2016 04:56 PM   Modules accepted: Orders, SmartSet

## 2016-01-10 NOTE — Patient Instructions (Addendum)
Medication Instructions:  START Aspirin 81mg  take 1 tablet once a day  Labwork: Your physician recommends that you return for lab work in: [place] CATH- CBC, BMP, TSH, PT, PTT  Testing/Procedures: Your physician has requested that you have a cardiac catheterization. Cardiac catheterization is used to diagnose and/or treat various heart conditions. Doctors may recommend this procedure for a number of different reasons. The most common reason is to evaluate chest pain. Chest pain can be a symptom of coronary artery disease (CAD), and cardiac catheterization can show whether plaque is narrowing or blocking your heart's arteries. This procedure is also used to evaluate the valves, as well as measure the blood flow and oxygen levels in different parts of your heart. For further information please visit [facility]. Please follow instruction sheet, as given.  SCHEDULE CATH FOR TOMORROW OR ASAP  Follow-Up: Your physician recommends that you schedule a follow-up appointment in: [place], [place]  Any Other Special Instructions Will Be Listed Below (If Applicable).  RX FOR NITRO SENT TO PHARMACY FOR CHEST PAIN   If you need a refill on your cardiac medications before your next appointment, please call your pharmacy.

## 2016-01-10 NOTE — Assessment & Plan Note (Signed)
Last LDL was 143 2016-not on statin rx

## 2016-01-14 ENCOUNTER — Ambulatory Visit (HOSPITAL_COMMUNITY)
Admission: RE | Admit: 2016-01-14 | Discharge: 2016-01-15 | Disposition: A | Discharge status: Home / Self Care | Payer: Medicare Other | Source: Ambulatory Visit | Attending: Internal Medicine | Admitting: Internal Medicine

## 2016-01-14 ENCOUNTER — Encounter (HOSPITAL_COMMUNITY): Payer: Self-pay | Admitting: General Practice

## 2016-01-14 ENCOUNTER — Encounter (HOSPITAL_COMMUNITY)
Admission: RE | Disposition: A | Discharge status: Home / Self Care | Payer: Self-pay | Source: Ambulatory Visit | Attending: Internal Medicine

## 2016-01-14 DIAGNOSIS — R7989 Other specified abnormal findings of blood chemistry: Secondary | ICD-10-CM

## 2016-01-14 DIAGNOSIS — I21A9 Other myocardial infarction type: Secondary | ICD-10-CM | POA: Insufficient documentation

## 2016-01-14 DIAGNOSIS — Z8546 Personal history of malignant neoplasm of prostate: Secondary | ICD-10-CM | POA: Insufficient documentation

## 2016-01-14 DIAGNOSIS — E785 Hyperlipidemia, unspecified: Secondary | ICD-10-CM | POA: Insufficient documentation

## 2016-01-14 DIAGNOSIS — Z955 Presence of coronary angioplasty implant and graft: Secondary | ICD-10-CM

## 2016-01-14 DIAGNOSIS — Z8249 Family history of ischemic heart disease and other diseases of the circulatory system: Secondary | ICD-10-CM | POA: Diagnosis not present

## 2016-01-14 DIAGNOSIS — I2510 Atherosclerotic heart disease of native coronary artery without angina pectoris: Secondary | ICD-10-CM

## 2016-01-14 DIAGNOSIS — N189 Chronic kidney disease, unspecified: Secondary | ICD-10-CM | POA: Insufficient documentation

## 2016-01-14 DIAGNOSIS — I9789 Other postprocedural complications and disorders of the circulatory system, not elsewhere classified: Secondary | ICD-10-CM | POA: Diagnosis not present

## 2016-01-14 DIAGNOSIS — R0789 Other chest pain: Secondary | ICD-10-CM | POA: Diagnosis present

## 2016-01-14 DIAGNOSIS — Y831 Surgical operation with implant of artificial internal device as the cause of abnormal reaction of the patient, or of later complication, without mention of misadventure at the time of the procedure: Secondary | ICD-10-CM | POA: Insufficient documentation

## 2016-01-14 DIAGNOSIS — I97190 Other postprocedural cardiac functional disturbances following cardiac surgery: Secondary | ICD-10-CM | POA: Insufficient documentation

## 2016-01-14 DIAGNOSIS — I200 Unstable angina: Secondary | ICD-10-CM | POA: Diagnosis present

## 2016-01-14 DIAGNOSIS — Z923 Personal history of irradiation: Secondary | ICD-10-CM | POA: Insufficient documentation

## 2016-01-14 DIAGNOSIS — Z9861 Coronary angioplasty status: Secondary | ICD-10-CM

## 2016-01-14 DIAGNOSIS — Z79899 Other long term (current) drug therapy: Secondary | ICD-10-CM | POA: Diagnosis not present

## 2016-01-14 DIAGNOSIS — F0390 Unspecified dementia without behavioral disturbance: Secondary | ICD-10-CM | POA: Insufficient documentation

## 2016-01-14 DIAGNOSIS — Z87891 Personal history of nicotine dependence: Secondary | ICD-10-CM | POA: Diagnosis not present

## 2016-01-14 DIAGNOSIS — I25110 Atherosclerotic heart disease of native coronary artery with unstable angina pectoris: Secondary | ICD-10-CM

## 2016-01-14 DIAGNOSIS — Z8241 Family history of sudden cardiac death: Secondary | ICD-10-CM | POA: Diagnosis not present

## 2016-01-14 DIAGNOSIS — Z7982 Long term (current) use of aspirin: Secondary | ICD-10-CM | POA: Diagnosis not present

## 2016-01-14 DIAGNOSIS — I129 Hypertensive chronic kidney disease with stage 1 through stage 4 chronic kidney disease, or unspecified chronic kidney disease: Secondary | ICD-10-CM | POA: Insufficient documentation

## 2016-01-14 DIAGNOSIS — R079 Chest pain, unspecified: Secondary | ICD-10-CM

## 2016-01-14 DIAGNOSIS — I25119 Atherosclerotic heart disease of native coronary artery with unspecified angina pectoris: Secondary | ICD-10-CM | POA: Diagnosis present

## 2016-01-14 DIAGNOSIS — R778 Other specified abnormalities of plasma proteins: Secondary | ICD-10-CM | POA: Clinically undetermined

## 2016-01-14 HISTORY — PX: CARDIAC CATHETERIZATION: SHX172

## 2016-01-14 HISTORY — DX: Malignant neoplasm of prostate: C61

## 2016-01-14 HISTORY — DX: Unspecified malignant neoplasm of skin, unspecified: C44.90

## 2016-01-14 HISTORY — PX: CORONARY ANGIOPLASTY WITH STENT PLACEMENT: SHX49

## 2016-01-14 LAB — CBC
HCT: 41.7 % (ref 39.0–52.0)
HEMATOCRIT: 40.9 % (ref 39.0–52.0)
HEMOGLOBIN: 13.6 g/dL (ref 13.0–17.0)
Hemoglobin: 14.1 g/dL (ref 13.0–17.0)
MCH: 30.2 pg (ref 26.0–34.0)
MCH: 29.6 pg (ref 26.0–34.0)
MCHC: 33.8 g/dL (ref 30.0–36.0)
MCHC: 33.3 g/dL (ref 30.0–36.0)
MCV: 89.3 fL (ref 78.0–100.0)
MCV: 88.9 fL (ref 78.0–100.0)
PLATELETS: 249 10*3/uL (ref 150–400)
Platelets: 244 10*3/uL (ref 150–400)
RBC: 4.67 MIL/uL (ref 4.22–5.81)
RBC: 4.6 MIL/uL (ref 4.22–5.81)
RDW: 13.3 % (ref 11.5–15.5)
RDW: 13.3 % (ref 11.5–15.5)
WBC: 6.7 10*3/uL (ref 4.0–10.5)
WBC: 10.9 10*3/uL — ABNORMAL HIGH (ref 4.0–10.5)

## 2016-01-14 LAB — BASIC METABOLIC PANEL
ANION GAP: 9 (ref 5–15)
BUN: 23 mg/dL — ABNORMAL HIGH (ref 6–20)
CALCIUM: 9.3 mg/dL (ref 8.9–10.3)
CO2: 24 mmol/L (ref 22–32)
CREATININE: 1.3 mg/dL — AB (ref 0.61–1.24)
Chloride: 105 mmol/L (ref 101–111)
GFR, EST AFRICAN AMERICAN: 56 mL/min — AB (ref >60)
GFR, EST NON AFRICAN AMERICAN: 49 mL/min — AB (ref >60)
Glucose, Bld: 115 mg/dL — ABNORMAL HIGH (ref 65–99)
Potassium: 3.8 mmol/L (ref 3.5–5.1)
Sodium: 138 mmol/L (ref 135–145)

## 2016-01-14 LAB — POCT ACTIVATED CLOTTING TIME
Activated Clotting Time: 241 seconds
Activated Clotting Time: 290 seconds
Activated Clotting Time: 290 seconds

## 2016-01-14 LAB — TROPONIN I: TROPONIN I: 0.21 ng/mL — AB (ref <0.03)

## 2016-01-14 LAB — TSH: TSH: 2.589 u[IU]/mL (ref 0.350–4.500)

## 2016-01-14 LAB — MRSA PCR SCREENING: MRSA by PCR: POSITIVE — AB

## 2016-01-14 LAB — PROTIME-INR
INR: 1.01
PROTHROMBIN TIME: 13.3 s (ref 11.4–15.2)

## 2016-01-14 SURGERY — LEFT HEART CATH AND CORONARY ANGIOGRAPHY
Anesthesia: LOCAL

## 2016-01-14 MED ORDER — CHLORHEXIDINE GLUCONATE CLOTH 2 % EX PADS
6.0000 | MEDICATED_PAD | Freq: Every day | CUTANEOUS | Status: DC
  Administered 2016-01-15: 06:00:00 6 via TOPICAL

## 2016-01-14 MED ORDER — HYDROCHLOROTHIAZIDE 25 MG PO TABS
25.0000 mg | ORAL_TABLET | Freq: Every day | ORAL | Status: DC
  Filled 2016-01-14 (×2): qty 1

## 2016-01-14 MED ORDER — SODIUM CHLORIDE 0.9 % IV SOLN
INTRAVENOUS | Status: AC
Start: 2016-01-14 — End: 2016-01-14

## 2016-01-14 MED ORDER — SODIUM CHLORIDE 0.9 % WEIGHT BASED INFUSION
3.0000 mL/kg/h | INTRAVENOUS | Status: DC
  Administered 2016-01-14: 3 mL/kg/h via INTRAVENOUS
  Administered 2016-01-14: 1 mL via INTRAVENOUS

## 2016-01-14 MED ORDER — TICAGRELOR 90 MG PO TABS
90.0000 mg | ORAL_TABLET | Freq: Two times a day (BID) | ORAL | Status: DC
  Administered 2016-01-15 (×2): 90 mg via ORAL
  Filled 2016-01-14 (×2): qty 1

## 2016-01-14 MED ORDER — NITROGLYCERIN 1 MG/10 ML FOR IR/CATH LAB
INTRA_ARTERIAL | Status: AC
  Filled 2016-01-14: qty 10

## 2016-01-14 MED ORDER — HEPARIN (PORCINE) IN NACL 2-0.9 UNIT/ML-% IJ SOLN
INTRAMUSCULAR | Status: DC | PRN
  Administered 2016-01-14: 1000 mL

## 2016-01-14 MED ORDER — HEPARIN SODIUM (PORCINE) 1000 UNIT/ML IJ SOLN
INTRAMUSCULAR | Status: DC | PRN
  Administered 2016-01-14: 2000 [IU] via INTRAVENOUS
  Administered 2016-01-14: 3000 [IU] via INTRAVENOUS
  Administered 2016-01-14: 2000 [IU] via INTRAVENOUS
  Administered 2016-01-14: 3000 [IU] via INTRAVENOUS

## 2016-01-14 MED ORDER — ATORVASTATIN CALCIUM 20 MG PO TABS
20.0000 mg | ORAL_TABLET | Freq: Every day | ORAL | Status: DC
  Administered 2016-01-14: 20 mg via ORAL
  Filled 2016-01-14: qty 1

## 2016-01-14 MED ORDER — HEPARIN SODIUM (PORCINE) 5000 UNIT/ML IJ SOLN
5000.0000 [IU] | Freq: Three times a day (TID) | INTRAMUSCULAR | Status: DC
  Administered 2016-01-14 – 2016-01-15 (×2): 5000 [IU] via SUBCUTANEOUS
  Filled 2016-01-14 (×3): qty 1

## 2016-01-14 MED ORDER — ONDANSETRON HCL 4 MG/2ML IJ SOLN
4.0000 mg | Freq: Four times a day (QID) | INTRAMUSCULAR | Status: DC | PRN
  Administered 2016-01-14: 20:00:00 4 mg via INTRAVENOUS
  Filled 2016-01-14: qty 2

## 2016-01-14 MED ORDER — TICAGRELOR 90 MG PO TABS
ORAL_TABLET | ORAL | Status: DC | PRN
  Administered 2016-01-14: 180 mg via ORAL

## 2016-01-14 MED ORDER — ALUM & MAG HYDROXIDE-SIMETH 200-200-20 MG/5ML PO SUSP
30.0000 mL | ORAL | Status: DC | PRN
  Administered 2016-01-14: 20:00:00 30 mL via ORAL
  Filled 2016-01-14: qty 30

## 2016-01-14 MED ORDER — LISINOPRIL 10 MG PO TABS
20.0000 mg | ORAL_TABLET | Freq: Every day | ORAL | Status: DC
  Administered 2016-01-15: 11:00:00 20 mg via ORAL
  Filled 2016-01-14 (×2): qty 2

## 2016-01-14 MED ORDER — MIDAZOLAM HCL 2 MG/2ML IJ SOLN
INTRAMUSCULAR | Status: AC
  Filled 2016-01-14: qty 2

## 2016-01-14 MED ORDER — SODIUM CHLORIDE 0.9% FLUSH: 3.0000 mL | INTRAVENOUS | Status: DC | PRN

## 2016-01-14 MED ORDER — IOPAMIDOL (ISOVUE-370) INJECTION 76%
INTRAVENOUS | Status: AC
  Filled 2016-01-14: qty 50

## 2016-01-14 MED ORDER — NITROGLYCERIN 0.4 MG SL SUBL
SUBLINGUAL_TABLET | SUBLINGUAL | Status: AC
  Administered 2016-01-14: 0.4 mg via SUBLINGUAL
  Filled 2016-01-14: qty 3

## 2016-01-14 MED ORDER — ASPIRIN 81 MG PO CHEW: 81.0000 mg | CHEWABLE_TABLET | Freq: Every day | ORAL | Status: DC

## 2016-01-14 MED ORDER — ASPIRIN 81 MG PO CHEW
CHEWABLE_TABLET | ORAL | Status: AC
  Filled 2016-01-14: qty 1

## 2016-01-14 MED ORDER — SODIUM CHLORIDE 0.9 % IV BOLUS (SEPSIS)
250.0000 mL | Freq: Once | INTRAVENOUS | Status: AC
  Administered 2016-01-14: 250 mL via INTRAVENOUS

## 2016-01-14 MED ORDER — LIDOCAINE HCL (PF) 1 % IJ SOLN
INTRAMUSCULAR | Status: AC
  Filled 2016-01-14: qty 30

## 2016-01-14 MED ORDER — ASPIRIN 81 MG PO CHEW: 81.0000 mg | CHEWABLE_TABLET | ORAL | Status: DC

## 2016-01-14 MED ORDER — VERAPAMIL HCL 2.5 MG/ML IV SOLN
INTRAVENOUS | Status: AC
  Filled 2016-01-14: qty 2

## 2016-01-14 MED ORDER — HEPARIN (PORCINE) IN NACL 2-0.9 UNIT/ML-% IJ SOLN
INTRAMUSCULAR | Status: AC
  Filled 2016-01-14: qty 1000

## 2016-01-14 MED ORDER — SODIUM CHLORIDE 0.9 % IV SOLN: 250.0000 mL | INTRAVENOUS | Status: DC | PRN

## 2016-01-14 MED ORDER — LISINOPRIL-HYDROCHLOROTHIAZIDE 20-25 MG PO TABS: 1.0000 | ORAL_TABLET | Freq: Every day | ORAL | Status: DC

## 2016-01-14 MED ORDER — MIDAZOLAM HCL 2 MG/2ML IJ SOLN
INTRAMUSCULAR | Status: DC | PRN
  Administered 2016-01-14: 1 mg via INTRAVENOUS

## 2016-01-14 MED ORDER — FOLIC ACID 1 MG PO TABS
1.0000 mg | ORAL_TABLET | Freq: Every day | ORAL | Status: DC
  Administered 2016-01-15: 1 mg via ORAL
  Filled 2016-01-14: qty 1

## 2016-01-14 MED ORDER — ZOLPIDEM TARTRATE 5 MG PO TABS: 5.0000 mg | ORAL_TABLET | Freq: Every evening | ORAL | Status: DC | PRN

## 2016-01-14 MED ORDER — ACETAMINOPHEN 325 MG PO TABS
650.0000 mg | ORAL_TABLET | ORAL | Status: DC | PRN
  Administered 2016-01-15: 650 mg via ORAL
  Filled 2016-01-14: qty 2

## 2016-01-14 MED ORDER — LISINOPRIL 10 MG PO TABS: 10.0000 mg | ORAL_TABLET | Freq: Once | ORAL | Status: DC

## 2016-01-14 MED ORDER — TICAGRELOR 90 MG PO TABS
ORAL_TABLET | ORAL | Status: AC
  Filled 2016-01-14: qty 2

## 2016-01-14 MED ORDER — FENTANYL CITRATE (PF) 100 MCG/2ML IJ SOLN
INTRAMUSCULAR | Status: AC
  Filled 2016-01-14: qty 2

## 2016-01-14 MED ORDER — IOPAMIDOL (ISOVUE-370) INJECTION 76%
INTRAVENOUS | Status: DC | PRN
  Administered 2016-01-14: 145 mL via INTRAVENOUS

## 2016-01-14 MED ORDER — FOLIC ACID 1 MG PO TABS
1.0000 mg | ORAL_TABLET | Freq: Every day | ORAL | Status: DC
  Filled 2016-01-14: qty 1

## 2016-01-14 MED ORDER — HYDRALAZINE HCL 20 MG/ML IJ SOLN
20.0000 mg | INTRAMUSCULAR | Status: DC | PRN
  Administered 2016-01-14: 20 mg via INTRAVENOUS
  Filled 2016-01-14: qty 1

## 2016-01-14 MED ORDER — ENSURE ENLIVE PO LIQD
237.0000 mL | Freq: Two times a day (BID) | ORAL | Status: DC
  Administered 2016-01-15: 237 mL via ORAL
  Filled 2016-01-14 (×4): qty 237

## 2016-01-14 MED ORDER — HEPARIN SODIUM (PORCINE) 1000 UNIT/ML IJ SOLN
INTRAMUSCULAR | Status: AC
  Filled 2016-01-14: qty 1

## 2016-01-14 MED ORDER — NITROGLYCERIN 1 MG/10 ML FOR IR/CATH LAB
INTRA_ARTERIAL | Status: DC | PRN
  Administered 2016-01-14 (×2): 200 ug via INTRACORONARY

## 2016-01-14 MED ORDER — VERAPAMIL HCL 2.5 MG/ML IV SOLN
INTRAVENOUS | Status: DC | PRN
  Administered 2016-01-14: 10 mL via INTRA_ARTERIAL

## 2016-01-14 MED ORDER — MUPIROCIN 2 % EX OINT
1.0000 "application " | TOPICAL_OINTMENT | Freq: Two times a day (BID) | CUTANEOUS | Status: DC
  Administered 2016-01-15 (×2): 1 via NASAL
  Filled 2016-01-14 (×2): qty 22

## 2016-01-14 MED ORDER — IOPAMIDOL (ISOVUE-370) INJECTION 76%
INTRAVENOUS | Status: AC
  Filled 2016-01-14: qty 100

## 2016-01-14 MED ORDER — LIDOCAINE HCL (PF) 1 % IJ SOLN
INTRAMUSCULAR | Status: DC | PRN
  Administered 2016-01-14: 2 mL

## 2016-01-14 MED ORDER — BUPROPION HCL ER (XL) 150 MG PO TB24
150.0000 mg | ORAL_TABLET | Freq: Every day | ORAL | Status: DC
  Filled 2016-01-14: qty 1

## 2016-01-14 MED ORDER — ANGIOPLASTY BOOK
Freq: Once | Status: AC
  Administered 2016-01-14: 20:00:00
  Filled 2016-01-14: qty 1

## 2016-01-14 MED ORDER — ASPIRIN EC 81 MG PO TBEC
81.0000 mg | DELAYED_RELEASE_TABLET | Freq: Every day | ORAL | Status: DC
  Administered 2016-01-15: 11:00:00 81 mg via ORAL
  Filled 2016-01-14: qty 1

## 2016-01-14 MED ORDER — SODIUM CHLORIDE 0.9 % WEIGHT BASED INFUSION: 1.0000 mL/kg/h | INTRAVENOUS | Status: DC

## 2016-01-14 MED ORDER — ASPIRIN 81 MG PO CHEW
CHEWABLE_TABLET | ORAL | Status: DC | PRN
  Administered 2016-01-14: 81 mg via ORAL

## 2016-01-14 MED ORDER — SODIUM CHLORIDE 0.9% FLUSH
3.0000 mL | Freq: Two times a day (BID) | INTRAVENOUS | Status: DC
  Administered 2016-01-15: 3 mL via INTRAVENOUS

## 2016-01-14 MED ORDER — BUPROPION HCL ER (XL) 150 MG PO TB24
150.0000 mg | ORAL_TABLET | Freq: Every day | ORAL | Status: DC
  Administered 2016-01-15: 11:00:00 150 mg via ORAL
  Filled 2016-01-14: qty 1

## 2016-01-14 MED ORDER — FENTANYL CITRATE (PF) 100 MCG/2ML IJ SOLN
INTRAMUSCULAR | Status: DC | PRN
  Administered 2016-01-14 (×2): 25 ug via INTRAVENOUS

## 2016-01-14 MED ORDER — NITROGLYCERIN 0.4 MG SL SUBL
0.4000 mg | SUBLINGUAL_TABLET | Freq: Once | SUBLINGUAL | Status: AC
  Administered 2016-01-14: 0.4 mg via SUBLINGUAL

## 2016-01-14 MED ORDER — ZOLPIDEM TARTRATE 5 MG PO TABS
5.0000 mg | ORAL_TABLET | Freq: Every evening | ORAL | Status: DC | PRN
  Administered 2016-01-15: 01:00:00 5 mg via ORAL
  Filled 2016-01-14: qty 1

## 2016-01-14 SURGICAL SUPPLY — 21 items
BALLN MINITREK RX 2.0X15 (BALLOONS) ×2
BALLN ~~LOC~~ TREK RX 3.0X8 (BALLOONS) ×2
BALLOON MINITREK RX 2.0X15 (BALLOONS) ×1 IMPLANT
BALLOON ~~LOC~~ TREK RX 3.0X8 (BALLOONS) ×1 IMPLANT
CATH INFINITI 5 FR JL3.5 (CATHETERS) ×2 IMPLANT
CATH INFINITI JR4 5F (CATHETERS) ×2 IMPLANT
CATH VISTA GUIDE 6FR XB3 (CATHETERS) ×2 IMPLANT
DEVICE RAD COMP TR BAND LRG (VASCULAR PRODUCTS) ×2 IMPLANT
GLIDESHEATH SLEND SS 6F .021 (SHEATH) ×2 IMPLANT
GUIDEWIRE INQWIRE 1.5J.035X260 (WIRE) ×1 IMPLANT
INQWIRE 1.5J .035X260CM (WIRE) ×2
KIT ENCORE 26 ADVANTAGE (KITS) ×2 IMPLANT
KIT HEART LEFT (KITS) ×2 IMPLANT
PACK CARDIAC CATHETERIZATION (CUSTOM PROCEDURE TRAY) ×2 IMPLANT
STENT XIENCE ALPINE RX 2.5X18 (Permanent Stent) ×2 IMPLANT
STENT XIENCE ALPINE RX 2.75X12 (Permanent Stent) ×2 IMPLANT
TRANSDUCER W/STOPCOCK (MISCELLANEOUS) ×2 IMPLANT
TUBING CIL FLEX 10 FLL-RA (TUBING) ×2 IMPLANT
WIRE ASAHI PROWATER 180CM (WIRE) ×4 IMPLANT
WIRE COUGAR XT STRL 190CM (WIRE) ×2 IMPLANT
WIRE HI TORQ BMW 190CM (WIRE) ×2 IMPLANT

## 2016-01-14 NOTE — Interval H&P Note (Signed)
History and Physical Interval Note:  01/14/2016 2:02 PM  Andre Casey.  has presented today for cardiac catheterization, with the diagnosis of cp, fatigue  The various methods of treatment have been discussed with the patient and family. After consideration of risks, benefits and other options for treatment, the patient has consented to  Procedure(s): Left Heart Cath and Coronary Angiography (N/A) as a surgical intervention .  The patient's history has been reviewed, patient examined, no change in status, stable for surgery.  I have reviewed the patient's chart and labs.  Questions were answered to the patient's satisfaction.    Cath Lab Visit (complete for each Cath Lab visit)  Clinical Evaluation Leading to the Procedure:   ACS: No.  Non-ACS:    Anginal Classification: CCS IV  Anti-ischemic medical therapy: No Therapy  Non-Invasive Test Results: No non-invasive testing performed  Prior CABG: No previous CABG  [name]

## 2016-01-14 NOTE — Progress Notes (Signed)
CRITICAL VALUE ALERT  Critical value received:  Troponin I- 0.21  Date of notification:  01/14/2016   Time of notification:  2125  Critical value read back:Yes.    Nurse who received alert:  Helayne Seminole, RN   MD notified (1st page):  Melton Krebs, MD  Time of first page:  2155  Responding MD:  Dr. Jeannine Boga, MD  Time MD responded:  2156

## 2016-01-14 NOTE — Care Management Note (Addendum)
Case Management Note  Patient Details  Name: Andre Casey. MRN: [id number] Date of Birth: Aug 12, 1929  Subjective/Objective:   S/p coronary stent intervention, will be on brilinta , NCM awaiting benefit check , pta indep, has pcp, has medication coverage, and transportation at dc. NCM gave patient 30 day savings card. Marland Kitchen NCM will cont to follow for dc needs.    11/21 1121 - NCM informed patient co pay is 84.29 , he said this may be too much for him, NCM explained that he will have 30 days free  With the coupon, and then his refill will be the co pay amount but at his follow up apt with his cardiologist to let him know that this is too much and he will most likely change him to something else.  Informed him that his cardiologist has samples as well.               Action/Plan:   Expected Discharge Date:                  Expected Discharge Plan:  [place]  In-House Referral:     Discharge planning Services  CM Consult  Post Acute Care Choice:    Choice offered to:     DME Arranged:    DME Agency:     HH Arranged:    [place] Agency:     Status of Service:  In process, will continue to follow  If discussed at Long Length of Stay Meetings, dates discussed:    Additional Comments:  Zenon Mayo, RN 01/14/2016, 4:39 PM

## 2016-01-14 NOTE — Brief Op Note (Signed)
Brief Post Cardiac Catheterization Note (Full Report To Follow)  01/14/2016  3:34 PM  PATIENT:  Andre Casey.  82 y.o. male  PRE-OPERATIVE DIAGNOSIS:  cp, fatigue  POST-OPERATIVE DIAGNOSIS:  Severe single-vessel coronary artery disease  PROCEDURE:  Procedure(s): Left Heart Cath and Coronary Angiography (N/A) Coronary Stent Intervention (N/A)  SURGEON:  Surgeon(s) and Role:    * Nelva Bush, MD - Primary  Findings: 1. Sequential 99% and 95% ostial/proximal and mid LAD stenoses with TIMI-2 flow. 2. Mild, non-obstructive CAD involving LCx and RCA. 3. Successful PCI to LAD with non-overlapping Xience Alpine 2.75 x 12 mm (ostial/proximal) and 2.5 x 18 mm (mid) drug-eluting stents with 0% residual stenosis and TIMI-3 flow.  Recommendations: 1. Admit for overnight observation. 2. DAPT for at least 12 months, ideally longer. 3. TR band protocol.

## 2016-01-14 NOTE — Progress Notes (Signed)
TR BAND REMOVAL  LOCATION:    right radial  DEFLATED PER PROTOCOL:    Yes.    TIME BAND OFF / DRESSING APPLIED:    2100   SITE UPON ARRIVAL:    Level 0  SITE AFTER BAND REMOVAL:    Level 1  CIRCULATION SENSATION AND MOVEMENT:    Within Normal Limits   Yes.    COMMENTS:   Pt tolerated well.

## 2016-01-14 NOTE — Progress Notes (Signed)
Post Catheterization Note  Subjective:  Patient evaluated post-cath ~1915.  He reported 8/10 chest pain that was sharp and different than what he has experienced in the past.  He does not recall when pain began; his RN reports that he noted some pain after arriving from the cath lab, though the patient denies this.  He received NTG x 1 with marked drop in his blood pressure and accompanying nausea.  He receive Zofran and Maalox.  His pain has gradually improved over the last hour; he now notes only minimal discomfort (less than 1/10).  He denies shortness of breath.  Nausea has also resolved.  Of note, patient had receive hydralazine 10 mg IV x1 at 1900 for elevated BP.  Objective: Temp:  [97.7 F (36.5 C)] 97.7 F (36.5 C) (11/20 0837) Pulse Rate:  [0-75] 56 (11/20 1645) Resp:  [0-20] 11 (11/20 2000) BP: (78-184)/(40-108) 101/45 (11/20 2000) SpO2:  [0 %-100 %] 99 % (11/20 1645) Weight:  [63.5 kg (140 lb)] 63.5 kg (140 lb) (11/20 0837)  Current BP: 118/49 Gen: Elderly man, lying in bed.  He appears comfortable. Lungs: Clear anteriorly Heart: RRR without murmurs or rubs Abd: Soft, NT/ND Ext: Right radial arteriotomy site clean and dry with TR band in place  Labs: Lab Results  Component Value Date   WBC 10.9 (H) 01/14/2016   HGB 13.6 01/14/2016   HCT 40.9 01/14/2016   MCV 88.9 01/14/2016   PLT 244 01/14/2016   EKG: Normal sinus rhythm with PACs.  LAFB.  Poor R-wave progression in V1-V2 (potentially due to lead placement) and non-specific ST segment changes.  Overall appearance is not significantly changed from prior tracings back to 12/01/15.  Limited bedside echo: Technically difficult due to chest wall and/or lung interference.  LV size and contraction are grossly normal.  Unable to assess for regional wall motion abnormalities.  No significant pericardial effusion.  Assessment/Plan: 82 y/o man with significant single vessel CAD s/p DES x 2 (ostial/proximal and mid LAD) today,  complicated by post-PCI chest pain.  He received IV hydralazine followed by sublingual NTG with significant drop in BP, likely worsening his nausea.  He also had difficulty voiding with accompanying bradycardia suggesting a possible vagal mechanism as well.  There are no dynamic EKG changes and his chest pain has essentially resolved (notes very mild soreness, occasionally with deep inspiration).  Trend troponin overnight.  Slight elevation expected given severe disease with TIMI-2 flow and PCI to LAD.  APAP 650 mg Q6 hours prn pain.  If significant chest pain recurs, will plan for relook catheterization.  The patient and his family were updated on the plan and are in agreement.  Nelva Bush, MD [facility] Pager: [phone]

## 2016-01-14 NOTE — H&P (View-Only) (Signed)
01/10/2016 [place].   Sep 10, 1929  [id number]  Primary Physician [name], MD Primary Cardiologist: Dr Stanford Breed (new)  HPI:  Pleasant 82 y/o male with a history of HTN, untreated HLD, prostate cancer, and mild cognitive impairment (short term memory) seen in the office today as a referral from Dr Brigitte Pulse for chest pain. The pt had seen Dr Rex Kras in 2010 after an episode of chest pain. A nuclear stress then was negative. He has not had any cardiac issues until recently when he noted exertional chest pain "tightness".  He went to the ED 12/01/15 for this. This is not associated with nausea, vomiting, or diaphoresis. His symptoms then were atypical lasting only "seconds" but he now says he has chest pain with exertion that lasts 5 minutes or so. He had chest discomfort earlier while "rushing to get ready". He tells me if he were to walk dow the hall he would probably has chest pain. His symptoms are relieved with rest. He is in the office with his wife [name].     Current Outpatient Prescriptions  Medication Sig Dispense Refill  . acetaminophen (TYLENOL) 500 MG tablet Take 1,000 mg by mouth every 8 (eight) hours as needed for mild pain or headache.    Marland Kitchen aspirin 81 MG tablet Take 81 mg by mouth daily.    Marland Kitchen buPROPion (WELLBUTRIN XL) 150 MG 24 hr tablet Take 150 mg by mouth daily.    . Calcium Citrate-Vitamin D (CALCIUM + D PO) Take 1 tablet by mouth every other day.    . diclofenac sodium (VOLTAREN) 1 % GEL Apply 1 application topically 2 (two) times daily as needed.    Marland Kitchen FOLIC ACID PO Take by mouth daily.    Marland Kitchen lisinopril-hydrochlorothiazide (PRINZIDE,ZESTORETIC) 20-25 MG tablet Take 1 tablet by mouth daily.    Marland Kitchen loperamide (IMODIUM) 1 MG/5ML solution Take 1 mg by mouth as needed for diarrhea or loose stools.    . Multiple Vitamins-Minerals (MULTIVITAMIN WITH MINERALS) tablet Take 1 tablet by mouth daily.    Marland Kitchen oxyCODONE-acetaminophen (ROXICET) 5-325 MG tablet Take 1-2 tablets  by mouth every 4 (four) hours as needed for severe pain. 60 tablet 0  . potassium gluconate 595 (99 K) MG TABS tablet Take 595 mg by mouth every other day.    . Turmeric POWD 2.5 mLs by Does not apply route daily. Mix with water and 2 squirts of lemon juice    . zolpidem (AMBIEN) 10 MG tablet Take 5 mg by mouth daily.    . nitroGLYCERIN (NITROSTAT) 0.4 MG SL tablet Place 1 tablet (0.4 mg total) under the tongue every 5 (five) minutes as needed for chest pain. 75 tablet 0   No current facility-administered medications for this visit.     Allergies  Allergen Reactions  . Aricept [Donepezil Hcl] Other (See Comments)    Stomach upset    Social History   Social History  . Marital status: Married    Spouse name: N/A  . Number of children: 4  . Years of education: HS   Occupational History  . Retired    Social History Main Topics  . Smoking status: Former Research scientist (life sciences)  . Smokeless tobacco: Never Used     Comment: "not smoked for 50 years"  . Alcohol use No  . Drug use: No  . Sexual activity: Not on file   Other Topics Concern  . Not on file   Social History Narrative   Lives at home with his wife.  Right-handed.   No caffeine use.     Past Medical History:  Diagnosis Date  . Arthritis   . Cancer [facility])    history of prostate cancer  . Chest pain   . Hard of hearing   . History of radiation therapy   . Hypercholesteremia   . Hypertension   . Memory impairment    takes Namenda  . Numbness of toes    "right little toe"  . Urinary incontinence   . Use of leuprolide acetate (Lupron)    history of lupron injections   Family History  Problem Relation Age of Onset  . Hypertension Father   . CVA Father   . Hypertension Mother   . Dementia Mother   . COPD Sister     Review of Systems: General: negative for chills, fever, night sweats or weight changes.  Cardiovascular: negative for dyspnea on exertion, edema, orthopnea, palpitations, paroxysmal nocturnal dyspnea or  shortness of breath Dermatological: negative for rash Respiratory: negative for cough or wheezing Urologic: negative for hematuria Abdominal: negative for nausea, vomiting, diarrhea, bright red blood per rectum, melena, or hematemesis Neurologic: negative for visual changes, syncope, or dizziness All other systems reviewed and are otherwise negative except as noted above.    Blood pressure (!) 150/84, pulse 62, height 5\' 7"  (1.702 m), weight 140 lb 9.6 oz (63.8 kg).  General appearance: alert, cooperative and no distress Neck: no carotid bruit and no JVD Lungs: clear to auscultation bilaterally Heart: regular rate and rhythm Abdomen: soft, non-tender; bowel sounds normal; no masses,  no organomegaly Urol: history of urinary incontinence and radiation proctitis Extremities: extremities normal, atraumatic, no cyanosis or edema Pulses: 2+ and symmetric Skin: Skin color, texture, turgor normal. No rashes or lesions Neurologic: Grossly normal  EKG NSR-62  ASSESSMENT AND PLAN:   Unstable angina (HCC) New onset of exertional chest pain consistent with angina  Essential hypertension controlled  Dyslipidemia Last LDL was 143 2016-not on statin rx  Mild cognitive impairment Followed by Neurology  History of [place] Surgery in '92 [place]  Family history of sudden cardiac death [name] father died at 53   PLAN  Pt seen by Dr Stanford Breed and myself. His symptoms are worrisome for angina. We have added an ASA and NTG and will arrange for an OP coronary angiogram ASAP.  The patient understands that risks included but are not limited to stroke (1 in 1000), death (1 in 32), kidney failure [usually temporary] (1 in 500), bleeding (1 in 200), allergic reaction [possibly serious] (1 in 200).  The patient understands and agrees to proceed.   [name] PA-C 01/10/2016 4:40 PM

## 2016-01-15 ENCOUNTER — Encounter: Payer: Self-pay | Admitting: Cardiology

## 2016-01-15 ENCOUNTER — Ambulatory Visit (HOSPITAL_BASED_OUTPATIENT_CLINIC_OR_DEPARTMENT_OTHER): Payer: Medicare Other

## 2016-01-15 ENCOUNTER — Encounter (HOSPITAL_COMMUNITY): Payer: Self-pay | Admitting: Internal Medicine

## 2016-01-15 ENCOUNTER — Other Ambulatory Visit: Payer: Self-pay | Admitting: *Deleted

## 2016-01-15 DIAGNOSIS — Z87891 Personal history of nicotine dependence: Secondary | ICD-10-CM | POA: Diagnosis not present

## 2016-01-15 DIAGNOSIS — R778 Other specified abnormalities of plasma proteins: Secondary | ICD-10-CM | POA: Clinically undetermined

## 2016-01-15 DIAGNOSIS — E785 Hyperlipidemia, unspecified: Secondary | ICD-10-CM

## 2016-01-15 DIAGNOSIS — F0390 Unspecified dementia without behavioral disturbance: Secondary | ICD-10-CM | POA: Diagnosis not present

## 2016-01-15 DIAGNOSIS — R079 Chest pain, unspecified: Secondary | ICD-10-CM

## 2016-01-15 DIAGNOSIS — I2510 Atherosclerotic heart disease of native coronary artery without angina pectoris: Secondary | ICD-10-CM

## 2016-01-15 DIAGNOSIS — N189 Chronic kidney disease, unspecified: Secondary | ICD-10-CM | POA: Diagnosis not present

## 2016-01-15 DIAGNOSIS — I25119 Atherosclerotic heart disease of native coronary artery with unspecified angina pectoris: Secondary | ICD-10-CM | POA: Diagnosis not present

## 2016-01-15 DIAGNOSIS — R748 Abnormal levels of other serum enzymes: Secondary | ICD-10-CM | POA: Diagnosis not present

## 2016-01-15 DIAGNOSIS — Z9861 Coronary angioplasty status: Secondary | ICD-10-CM

## 2016-01-15 DIAGNOSIS — I200 Unstable angina: Secondary | ICD-10-CM

## 2016-01-15 DIAGNOSIS — I21A9 Other myocardial infarction type: Secondary | ICD-10-CM | POA: Diagnosis not present

## 2016-01-15 DIAGNOSIS — Z955 Presence of coronary angioplasty implant and graft: Secondary | ICD-10-CM

## 2016-01-15 DIAGNOSIS — R7989 Other specified abnormal findings of blood chemistry: Secondary | ICD-10-CM

## 2016-01-15 DIAGNOSIS — I25110 Atherosclerotic heart disease of native coronary artery with unstable angina pectoris: Secondary | ICD-10-CM | POA: Diagnosis not present

## 2016-01-15 DIAGNOSIS — Z79899 Other long term (current) drug therapy: Secondary | ICD-10-CM | POA: Diagnosis not present

## 2016-01-15 DIAGNOSIS — Z7982 Long term (current) use of aspirin: Secondary | ICD-10-CM | POA: Diagnosis not present

## 2016-01-15 DIAGNOSIS — I97190 Other postprocedural cardiac functional disturbances following cardiac surgery: Secondary | ICD-10-CM | POA: Diagnosis not present

## 2016-01-15 DIAGNOSIS — I129 Hypertensive chronic kidney disease with stage 1 through stage 4 chronic kidney disease, or unspecified chronic kidney disease: Secondary | ICD-10-CM | POA: Diagnosis not present

## 2016-01-15 DIAGNOSIS — Z8546 Personal history of malignant neoplasm of prostate: Secondary | ICD-10-CM | POA: Diagnosis not present

## 2016-01-15 DIAGNOSIS — I9789 Other postprocedural complications and disorders of the circulatory system, not elsewhere classified: Secondary | ICD-10-CM | POA: Diagnosis not present

## 2016-01-15 HISTORY — DX: Other specified abnormalities of plasma proteins: R77.8

## 2016-01-15 HISTORY — DX: Other specified abnormal findings of blood chemistry: R79.89

## 2016-01-15 LAB — BASIC METABOLIC PANEL
Anion gap: 8 (ref 5–15)
BUN: 17 mg/dL (ref 6–20)
CHLORIDE: 106 mmol/L (ref 101–111)
CO2: 22 mmol/L (ref 22–32)
Calcium: 8.8 mg/dL — ABNORMAL LOW (ref 8.9–10.3)
Creatinine, Ser: 1.16 mg/dL (ref 0.61–1.24)
GFR calc non Af Amer: 56 mL/min — ABNORMAL LOW (ref >60)
Glucose, Bld: 112 mg/dL — ABNORMAL HIGH (ref 65–99)
POTASSIUM: 3.7 mmol/L (ref 3.5–5.1)
SODIUM: 136 mmol/L (ref 135–145)

## 2016-01-15 LAB — ECHOCARDIOGRAM COMPLETE
HEIGHTINCHES: 67 in
WEIGHTICAEL: 2222.24 [oz_av]

## 2016-01-15 LAB — TROPONIN I: TROPONIN I: 0.3 ng/mL — AB (ref <0.03)

## 2016-01-15 MED ORDER — NITROGLYCERIN 0.4 MG SL SUBL: 0.4000 mg | SUBLINGUAL_TABLET | SUBLINGUAL | 0 refills | Status: DC | PRN

## 2016-01-15 MED ORDER — ATORVASTATIN CALCIUM 40 MG PO TABS: 40.0000 mg | ORAL_TABLET | Freq: Every day | ORAL | 12 refills | Status: DC

## 2016-01-15 MED ORDER — CARVEDILOL 3.125 MG PO TABS: 3.1250 mg | ORAL_TABLET | Freq: Two times a day (BID) | ORAL | 12 refills | Status: DC

## 2016-01-15 MED ORDER — CARVEDILOL 3.125 MG PO TABS
3.1250 mg | ORAL_TABLET | Freq: Two times a day (BID) | ORAL | Status: DC
  Administered 2016-01-15: 09:00:00 3.125 mg via ORAL
  Filled 2016-01-15: qty 1

## 2016-01-15 MED ORDER — ATORVASTATIN CALCIUM 40 MG PO TABS: 40.0000 mg | ORAL_TABLET | Freq: Every day | ORAL | Status: DC

## 2016-01-15 MED ORDER — TICAGRELOR 90 MG PO TABS
90.0000 mg | ORAL_TABLET | Freq: Two times a day (BID) | ORAL | 0 refills | Status: DC
Start: 2016-01-15 — End: 2016-03-04

## 2016-01-15 MED ORDER — HYDROCHLOROTHIAZIDE 25 MG PO TABS
25.0000 mg | ORAL_TABLET | Freq: Every day | ORAL | Status: DC
  Administered 2016-01-15: 11:00:00 25 mg via ORAL

## 2016-01-15 MED ORDER — NITROGLYCERIN 0.4 MG SL SUBL: 0.4000 mg | SUBLINGUAL_TABLET | SUBLINGUAL | 2 refills | Status: DC | PRN

## 2016-01-15 MED ORDER — TICAGRELOR 90 MG PO TABS: 90.0000 mg | ORAL_TABLET | Freq: Two times a day (BID) | ORAL | 12 refills | Status: DC

## 2016-01-15 MED ORDER — LISINOPRIL 10 MG PO TABS
20.0000 mg | ORAL_TABLET | Freq: Every day | ORAL | Status: DC
  Filled 2016-01-15: qty 2

## 2016-01-15 MED ORDER — LOPERAMIDE HCL 2 MG PO CAPS
2.0000 mg | ORAL_CAPSULE | Freq: Once | ORAL | Status: AC
  Administered 2016-01-15: 12:00:00 2 mg via ORAL
  Filled 2016-01-15: qty 1

## 2016-01-15 NOTE — Progress Notes (Signed)
CARDIAC REHAB PHASE I   PRE:  Rate/Rhythm: 77 SR  BP:  Sitting: 167/74        SaO2: 96 RA  MODE:  Ambulation: 160 ft   POST:  Rate/Rhythm: 101 ST  BP:  Sitting: 130/59         SaO2: 99 RA  Pt ambulated 160 ft on RA, condom cath, handheld assist, steady gait, tolerated well with no complaints. [name] condom cath fell off during ambulation, pt requested to return to room due to incontinence. Pt states his son will bring briefs and he will walk again at that time. Completed PCI/stent education.  Reviewed risk factors, PCI book, anti-platelet therapy, stent card, activity restrictions, ntg, exercise, heart healthy diet, and phase 2 cardiac rehab. Pt verbalized understanding. Pt agrees to phase 2 cardiac rehab referral, will send to [facility] per pt request. Pt to recliner after walk, call bell within reach.   [place], RN, BSN 01/15/2016 9:09 AM

## 2016-01-15 NOTE — Discharge Summary (Signed)
Discharge Summary    Patient ID: Andre Casey.,  MRN: [id number], DOB/AGE: [birth year] 82 y.o.  Admit date: 01/14/2016 Discharge date: 01/15/2016  Primary Care Provider: SHAW,KIMBERLEE Primary Cardiologist: Dr. 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   Allergies Allergies  Allergen Reactions  . Aricept [Donepezil Hcl] Other (See Comments)    Stomach upset    Diagnostic Studies/Procedures    Coronary Stent Intervention  Left Heart Cath and Coronary Angiography 01/14/16  Conclusions: 1. Severe single-vessel coronary artery disease with tandem 99% and 95% ostial/proximal and mid LAD stenoses with TIMI-2 flow. 2. Mild, non-obstructive coronary artery disease involving ramus, LCx, and RCA. 3. Normal left ventricular filling pressure. 4. Successful PCI to ostial/proximal and mid LAD with placement of non-overlapping Xience Alpine 2.75 x 12 mm (ostial/proximal) and 2.5 x 18 mm (mid) drug-eluting stents with 0% residual stenosis and TIMI-3 flow.  Recommendations: 1. Admit for overnight observation and gentle hydration, given chronic kidney disease. 2. Dual antiplatelet therapy with aspirin and ticagrelor for at least 12 months. 3. Aggressive secondary prevention, including statin therapy. 4. Transthoracic echocardiogram before discharge to evaluate LV function.   Transthoracic Echocardiography 01/15/16 Study Conclusions  - Left ventricle: The cavity size was normal. There was mild focal   basal hypertrophy of the septum. Systolic function was normal.   The estimated ejection fraction was in the range of 60% to 65%.   Wall motion was normal; there were no regional wall motion  abnormalities. Doppler parameters are consistent with abnormal   left ventricular relaxation (grade 1 diastolic dysfunction). - Pulmonary arteries: Systolic pressure was mildly increased. PA   peak pressure: 39 mm Hg (S).  Impressions:  - Normal LV systolic function; grade 1 diastolic dysfunction; mild   TR; mildly elevated pulmonary pressure    _____________   History of Present Illness   [name] is a 82 year old male with a past medical history of HTN, HLD, prostate CA and mild dementia. He was referred to our office by his PCP after reporting exertional angina. He was seen in the ED on 12/01/15 with angina and his troponin was negative.  He was seen in our office on 01/10/16 and reported chest pain whenever he exerted himself, even just walking down a hallway.  Outpatient cath was arranged.  Hospital Course     He had left heart cath on 01/14/16, report above. There was severe single vessel CAD with tandem 99% and 95% ostial/proximal and mid LAD stenoses. He had successful PCI to ostial/proximal and mid LAD with placement of non-overlapping Xience Alpine 2.75 x 12 mm (ostial/proximal) and 2.5 x 18 mm (mid) drug-eluting stents with 0% residual stenosis and TIMI-3 flow.  He will be on DAPT with ASA and Brilinta for at least 12 months.   Post cath he developed 8/10 chest pain that he described as sharp pain, this was different than the chest pain he had prior to his cath. He was given 1 SL Nitro and became hypotensive which resolved with IV fluids. There were no acute EKG changes. His troponin peaked overnight at only 0.3.   Echo was preformed and showed LVEF of 60-65%, no wall motion abnormalities. He did have  some elevated filling pressures consistent with grade 1 diastolic dysfunction.   He was started on Coreg 3.125mg  BID, can up titrate outpatient. He was also started on atorvastatin and will need LFT's and FLP in 6 weeks. His right radial site was stable without hematoma. He  was seen today by Dr. Ellyn Hack and deemed suitable for discharge.   _____________  Discharge Vitals Blood pressure (!) 147/69, pulse 63, temperature 97.9 F (36.6 C), temperature source Axillary, resp. rate 15, height 5\' 7"  (1.702 m), weight 138 lb 14.2 oz (63 kg), SpO2 99 %.  Filed Weights   01/14/16 0837 01/15/16 0544  Weight: 140 lb (63.5 kg) 138 lb 14.2 oz (63 kg)    Labs & Radiologic Studies     CBC  Recent Labs  01/14/16 0854 01/14/16 2021  WBC 6.7 10.9*  HGB 14.1 13.6  HCT 41.7 40.9  MCV 89.3 88.9  PLT 249 [id number]   Basic Metabolic Panel  Recent Labs  01/14/16 0854 01/15/16 0208  NA 138 136  K 3.8 3.7  CL 105 106  CO2 24 22  GLUCOSE 115* 112*  BUN 23* 17  CREATININE 1.30* 1.16  CALCIUM 9.3 8.8*   Cardiac Enzymes  Recent Labs  01/14/16 2021 01/15/16 0208  TROPONINI 0.21* 0.30*   Thyroid Function Tests  Recent Labs  01/14/16 0916  TSH 2.589    No results found.  Disposition   Pt is being discharged home today in good condition.  Follow-up Plans & Appointments    Follow-up Information    [name], [place] Follow up on 01/30/2016.   Specialties:  Cardiology, Radiology Why:  at 2:30pm for hospital follow up.  Contact information: [address] [place] [address] [phone]          Discharge Instructions    Amb Referral to Cardiac Rehabilitation    Complete by:  As directed    Diagnosis:  Coronary Stents   Diet - low sodium heart healthy    Complete by:  As directed    Discharge instructions    Complete by:  As directed    NO HEAVY LIFTING OR SEXUAL ACTIVITY for 7 DAYS. NO DRIVING for 3-5 DAYS. NO SOAKING BATHS, HOT TUBS, POOLS, ETC., for 7 DAYS\   Radial Site Care Refer to this sheet in the next few weeks. These instructions provide you with information on caring for yourself after your procedure. Your caregiver may also give you more specific instructions. Your treatment has been planned according to current  medical practices, but problems sometimes occur. Call your caregiver if you have any problems or questions after your procedure. HOME CARE INSTRUCTIONS You may shower the day after the procedure.Remove the bandage (dressing) and gently wash the site with plain soap and water.Gently pat the site dry.  Do not apply powder or lotion to the site.  Do not submerge the affected site in water for 3 to 5 days.  Inspect the site at least twice daily.  Do not flex or bend the affected arm for 24 hours.  No lifting over 5 pounds (2.3 kg) for 5 days after your procedure.  Do not drive home if you are discharged the same day of the procedure. Have someone else drive you.  You may drive 24 hours after the procedure unless otherwise instructed by your caregiver.  What to expect: Any bruising will usually fade within 1 to 2 weeks.  Blood that collects in the tissue (hematoma) may be painful to the  touch. It should usually decrease in size and tenderness within 1 to 2 weeks.  SEEK IMMEDIATE MEDICAL CARE IF: You have unusual pain at the radial site.  You have redness, warmth, swelling, or pain at the radial site.  You have drainage (other than a small amount of blood on the dressing).  You have chills.  You have a fever or persistent symptoms for more than 72 hours.  You have a fever and your symptoms suddenly get worse.  Your arm becomes pale, cool, tingly, or numb.  You have heavy bleeding from the site. Hold pressure on the site.   Increase activity slowly    Complete by:  As directed       Discharge Medications   Current Discharge Medication List    START taking these medications   Details  atorvastatin (LIPITOR) 40 MG tablet Take 1 tablet (40 mg total) by mouth daily at 6 PM. Qty: 30 tablet, Refills: 12    carvedilol (COREG) 3.125 MG tablet Take 1 tablet (3.125 mg total) by mouth 2 (two) times daily with a meal. Qty: 60 tablet, Refills: 12    nitroGLYCERIN (NITROSTAT) 0.4 MG SL tablet  Place 1 tablet (0.4 mg total) under the tongue every 5 (five) minutes as needed for chest pain. Qty: 25 tablet, Refills: 2    !! ticagrelor (BRILINTA) 90 MG TABS tablet Take 1 tablet (90 mg total) by mouth 2 (two) times daily. Qty: 60 tablet, Refills: 12    !! ticagrelor (BRILINTA) 90 MG TABS tablet Take 1 tablet (90 mg total) by mouth 2 (two) times daily. Qty: 60 tablet, Refills: 0     !! - Potential duplicate medications found. Please discuss with provider.    CONTINUE these medications which have NOT CHANGED   Details  acetaminophen (TYLENOL) 500 MG tablet Take 1,000 mg by mouth every 8 (eight) hours as needed for mild pain or headache.    aspirin 81 MG tablet Take 81 mg by mouth daily.    buPROPion (WELLBUTRIN XL) 150 MG 24 hr tablet Take 150 mg by mouth daily.    Calcium Citrate-Vitamin D (CALCIUM + D PO) Take 1 tablet by mouth every other day.    FOLIC ACID PO Take 1 tablet by mouth daily.     lisinopril-hydrochlorothiazide (PRINZIDE,ZESTORETIC) 20-25 MG tablet Take 1 tablet by mouth daily.    loperamide (IMODIUM) 1 MG/5ML solution Take 1 mg by mouth as needed for diarrhea or loose stools.    Multiple Vitamins-Minerals (MULTIVITAMIN WITH MINERALS) tablet Take 1 tablet by mouth daily.    potassium gluconate 595 (99 K) MG TABS tablet Take 595 mg by mouth every other day.    Turmeric POWD 2.5 mLs by Does not apply route daily. Mix with water and 2 squirts of lemon juice    zolpidem (AMBIEN) 10 MG tablet Take 5 mg by mouth at bedtime as needed.       STOP taking these medications     diclofenac sodium (VOLTAREN) 1 % GEL          Outstanding Labs/Studies   BMP. Fasting lipid panel and hepatic function in 6 weeks.   Duration of Discharge Encounter   Greater than 30 minutes including physician time.  Signed, Arbutus Leas NP 01/15/2016, 12:13 PM   I saw and examined the patient this morning along with Jettie Booze, NP-C. We reviewed all the available data in the  chart as well as physical exam findings and the patient's symptoms. I agree  with the discharge summary as discussed in our morning rounds.  The patient was doing well this morning following PCI with no further anginal symptoms. He did have some chest discomfort last night which is probably related to post balloon expansion spasm and hypertension. He became temporarily hypotensive with treatment of his hypertension, but is now relatively stable and if anything hypertensive.  Echocardiogram this morning showed relatively normal EF with no significant wall motion and amount is. Mildly elevated pulmonary pressures, but otherwise normal echo. Significant anterior wall motion abnormality, I suspect the mild troponin elevation was related to type IVa periprocedural myocardial infarction and not a true non-STEMI.  He is stable for discharge provided he was able to walk around today.   Glenetta Hew, M.D., M.S. Interventional Cardiologist   Pager # [phone] Phone # [phone] [address]. [place], [place] [address]

## 2016-01-15 NOTE — Progress Notes (Signed)
Nils Pyle, RN        S/W ROBERT @ [facility] #  [phone]    BRILINTA  90 MG  BID  30 / 60 TAB   COVER- YES  CO-PAY-- $ 84.29  TIER- 3 DRUG  PRIOR  APPROVAL- NO  PHARMACY : [facility]   RETAIL AND MAIL - ORDER 90 DAY SUPPLY $ 48.00

## 2016-01-15 NOTE — Progress Notes (Signed)
2D echocardiogram has been performed. 

## 2016-01-15 NOTE — Progress Notes (Signed)
Patient Name: Andre Casey. Date of Encounter: 01/15/2016  Primary Cardiologist: Dr. Stanford Breed  Patient Profile    82 year old male with a past medical history of HTN, HLD, prostate CA and some short term memory loss. He was referred by his PCP to our office and was seen on 01/10/16, reported chest tightness and exertional angina. Admitted for left heart cath, found to have severe single vessel CAD with tandem 99% and 95% ostial/proximal and mid LAD stenosis. Had successful DES x 2 placement.      Subjective   Feels great. Denies chest pain and SOB.   Inpatient Medications    Scheduled Meds: . aspirin EC  81 mg Oral Daily  . atorvastatin  20 mg Oral q1800  . buPROPion  150 mg Oral Daily  . carvedilol  3.125 mg Oral BID WC  . Chlorhexidine Gluconate Cloth  6 each Topical Q0600  . feeding supplement (ENSURE ENLIVE)  237 mL Oral BID BM  . folic acid  1 mg Oral Daily  . heparin  5,000 Units Subcutaneous Q8H  . lisinopril  20 mg Oral Daily   Or  . hydrochlorothiazide  25 mg Oral Daily  . mupirocin ointment  1 application Nasal BID  . sodium chloride flush  3 mL Intravenous Q12H  . ticagrelor  90 mg Oral BID   Continuous Infusions:  PRN Meds: sodium chloride, acetaminophen, alum & mag hydroxide-simeth, hydrALAZINE, ondansetron (ZOFRAN) IV, sodium chloride flush, zolpidem   Vital Signs    Vitals:   01/14/16 2000 01/14/16 2030 01/15/16 0544 01/15/16 0735  BP: (!) 101/45 (!) 118/49 (!) 155/90 (!) 167/74  Pulse:  70 78 76  Resp: 11 14 19 18   Temp:  98 F (36.7 C) 98.6 F (37 C) 97.9 F (36.6 C)  TempSrc:  Oral Oral Axillary  SpO2:  98% 96% 99%  Weight:   63 kg (138 lb 14.2 oz)   Height:        Intake/Output Summary (Last 24 hours) at 01/15/16 1039 Last data filed at 01/15/16 0840  Gross per 24 hour  Intake          1056.25 ml  Output             1750 ml  Net          -693.75 ml   Filed Weights   01/14/16 0837 01/15/16 0544  Weight: 63.5 kg (140 lb)  63 kg (138 lb 14.2 oz)    Physical Exam    GEN: Well nourished, well developed, in no acute distress.  HEENT: Grossly normal.  Neck: Supple, no JVD, carotid bruits, or masses. Cardiac: RRR, no murmurs, rubs, or gallops. No clubbing, cyanosis, edema.  Radials/DP/PT 2+ and equal bilaterally.  Respiratory:  Respirations regular and unlabored, clear to auscultation bilaterally. GI: Soft, nontender, nondistended, BS + x 4. MS: no deformity or atrophy. Skin: warm and dry, no rash. Neuro:  Strength and sensation are intact. Psych: AAOx3.  Normal affect.  Labs    CBC  Recent Labs  01/14/16 0854 01/14/16 2021  WBC 6.7 10.9*  HGB 14.1 13.6  HCT 41.7 40.9  MCV 89.3 88.9  PLT 249 [id number]   Basic Metabolic Panel  Recent Labs  01/14/16 0854 01/15/16 0208  NA 138 136  K 3.8 3.7  CL 105 106  CO2 24 22  GLUCOSE 115* 112*  BUN 23* 17  CREATININE 1.30* 1.16  CALCIUM 9.3 8.8*   Cardiac Enzymes  Recent Labs  01/14/16 2021 01/15/16 0208  TROPONINI 0.21* 0.30*   Thyroid Function Tests  Recent Labs  01/14/16 0916  TSH 2.589    Telemetry   NSR- Personally Reviewed  ECG    NSR, LAFB  - Personally Reviewed  Radiology    No results found.  Cardiac Studies   Coronary Stent Intervention  Left Heart Cath and Coronary Angiography 01/14/16   Conclusions: 1. Severe single-vessel coronary artery disease with tandem 99% and 95% ostial/proximal and mid LAD stenoses with TIMI-2 flow. 2. Mild, non-obstructive coronary artery disease involving ramus, LCx, and RCA. 3. Normal left ventricular filling pressure. 4. Successful PCI to ostial/proximal and mid LAD with placement of non-overlapping Xience Alpine 2.75 x 12 mm (ostial/proximal) and 2.5 x 18 mm (mid) drug-eluting stents with 0% residual stenosis and TIMI-3 flow.  Recommendations: 1. Admit for overnight observation and gentle hydration, given chronic kidney disease. 2. Dual antiplatelet therapy with aspirin and  ticagrelor for at least 12 months. 3. Aggressive secondary prevention, including statin therapy. 4. Transthoracic echocardiogram before discharge to evaluate LV function.   Hospital Problem List   Principal Problem:   Unstable angina [facility]) Active Problems:   Atherosclerotic heart disease of native coronary artery with angina pectoris (HCC)   Presence of drug coated stent in LAD coronary artery   Elevated troponin - Peri-procedural type 4a MI.   Dyslipidemia, goal LDL below 70   Assessment & Plan    1. CAD s/p DES x 2 to ostial and mid LAD: Will be on DAPT with ASA and Brilinta for at least 12 months.   He is chest pain free, had some chest pain last night post procedure and was given SL Nitro that resulted in some transient hypotension. Resolved now.   HLD Will initiate statin therapy with 40mg  atorvastatin. Also will start Coreg 3.125 mg BID.   He needs an echo prior to discharge.   2. HTN: Hypertensive this am. But with some hypotension overnight. So will continue meds at current doses for now.   3. History of renal insufficency:  Creatinine stable this am.   4. HLD: Continue atorvastatin. Will need FLP and hepatic function in 6 weeks.   Signed, Arbutus Leas, NP  01/15/2016, 10:39 AM   I have seen, examined and evaluated the patient this AM along with Jettie Booze, NP.  After reviewing all the available data and chart, we discussed the patients laboratory, study & physical findings as well as symptoms in detail. I agree with her findings, examination as well as impression recommendations as per our discussion.    Feeling much better today. No further anginal pain. I wonder if the angina he had yesterday was related to stent expansion and potential septal branch jailing. He was also quite hypertensive that was then treated with accommodation nitroglycerin and hydralazine leading to his blood pressure dropping precipitously. Now his blood pressures back stable and he is ambulating  around the room without any difficulty.  I do agree with checking an echocardiogram to ensure no anterior wall motion abnormality and to assess his filling pressures post PCI.  BP now probably @ baseline - has not been given ACE-I-HCT yet.  Adding Coreg 3.125 mg bid.  Ambulate today - follow echo.  If stable &no untowards findings on Echo- expect d/c later today.    Glenetta Hew, M.D., M.S. Interventional Cardiologist   Pager # [phone] Phone # [phone] [address]. [place], [place] [address]

## 2016-01-30 ENCOUNTER — Encounter: Payer: Self-pay | Admitting: Cardiology

## 2016-01-30 ENCOUNTER — Ambulatory Visit (INDEPENDENT_AMBULATORY_CARE_PROVIDER_SITE_OTHER): Payer: Medicare Other | Admitting: Cardiology

## 2016-01-30 VITALS — BP 100/60 | HR 76 | Ht 67.0 in | Wt 135.2 lb

## 2016-01-30 DIAGNOSIS — Z9889 Other specified postprocedural states: Secondary | ICD-10-CM | POA: Diagnosis not present

## 2016-01-30 DIAGNOSIS — Z9861 Coronary angioplasty status: Secondary | ICD-10-CM

## 2016-01-30 DIAGNOSIS — I25119 Atherosclerotic heart disease of native coronary artery with unspecified angina pectoris: Secondary | ICD-10-CM

## 2016-01-30 DIAGNOSIS — I209 Angina pectoris, unspecified: Secondary | ICD-10-CM

## 2016-01-30 DIAGNOSIS — I2510 Atherosclerotic heart disease of native coronary artery without angina pectoris: Secondary | ICD-10-CM | POA: Diagnosis not present

## 2016-01-30 DIAGNOSIS — I10 Essential (primary) hypertension: Secondary | ICD-10-CM

## 2016-01-30 DIAGNOSIS — R079 Chest pain, unspecified: Secondary | ICD-10-CM | POA: Diagnosis not present

## 2016-01-30 DIAGNOSIS — E785 Hyperlipidemia, unspecified: Secondary | ICD-10-CM

## 2016-01-30 NOTE — Assessment & Plan Note (Signed)
Usually well controlled with medication

## 2016-01-30 NOTE — Assessment & Plan Note (Signed)
On high dose statin rx 

## 2016-01-30 NOTE — Assessment & Plan Note (Signed)
Seen as an OP and set up for cath 01/14/16

## 2016-01-30 NOTE — Progress Notes (Signed)
01/30/2016 Andre Casey.   1929-08-23  [id number]  Primary Physician [name], MD Primary Cardiologist: Dr Stanford Breed  HPI:  82 y/o male with a history of HTN, untreated HLD, prostate cancer, and mild cognitive impairment (short term memory) seen in the office 01/10/16 as a referral from Dr Brigitte Pulse for chest pain. The pt had seen Dr Rex Kras in 2010 after an episode of chest pain. A nuclear stress then was negative. He has not had any cardiac issues until recently when he noted exertional chest pain "tightness".  He went to the ED 12/01/15 for this. His symptoms then were atypical lasting only "seconds" but he now says he has chest pain with exertion that lasts 5 minutes or so. He told me if he were to walk down the hall he would probably has chest pain. His symptoms are relieved with rest. He was seen by Dr Stanford Breed and myself and set up for an OP cath. This was done 01/14/16. It almost didn't get done after the pt learned his wife [name] had suffered a syncopal spell and was admitted. Fortunately he decided to proceed once he found out she was stable. Cath done 11/20 showed high grade tandem LAD lesions that were intervened on with DES with a good final result. He had some atypical later that evening and a slight bump in his Troponin but felt better the next morning. He is in the office today for follow up. He has done well since discharge "no more chest pain".     Current Outpatient Prescriptions  Medication Sig Dispense Refill  . acetaminophen (TYLENOL) 500 MG tablet Take 1,000 mg by mouth every 8 (eight) hours as needed for mild pain or headache.    Marland Kitchen aspirin 81 MG tablet Take 81 mg by mouth daily.    Marland Kitchen atorvastatin (LIPITOR) 40 MG tablet Take 1 tablet (40 mg total) by mouth daily at 6 PM. 30 tablet 12  . buPROPion (WELLBUTRIN XL) 150 MG 24 hr tablet Take 150 mg by mouth daily.    . Calcium Citrate-Vitamin D (CALCIUM + D PO) Take 1 tablet by mouth every other day.    Marland Kitchen FOLIC  ACID PO Take 1 tablet by mouth daily.     Marland Kitchen lisinopril-hydrochlorothiazide (PRINZIDE,ZESTORETIC) 20-25 MG tablet Take 1 tablet by mouth daily.    Marland Kitchen loperamide (IMODIUM) 1 MG/5ML solution Take 1 mg by mouth as needed for diarrhea or loose stools.    Marland Kitchen MAGNESIUM PO Take 1 tablet by mouth 2 (two) times daily.    . Multiple Vitamins-Minerals (MULTIVITAMIN WITH MINERALS) tablet Take 1 tablet by mouth daily.    . nitroGLYCERIN (NITROSTAT) 0.4 MG SL tablet Place 1 tablet (0.4 mg total) under the tongue every 5 (five) minutes as needed for chest pain. 25 tablet 2  . potassium gluconate 595 (99 K) MG TABS tablet Take 595 mg by mouth daily.     . ticagrelor (BRILINTA) 90 MG TABS tablet Take 1 tablet (90 mg total) by mouth 2 (two) times daily. 60 tablet 12  . ticagrelor (BRILINTA) 90 MG TABS tablet Take 1 tablet (90 mg total) by mouth 2 (two) times daily. 60 tablet 0  . Turmeric POWD 2.5 mLs by Does not apply route daily. Mix with water and 2 squirts of lemon juice    . zolpidem (AMBIEN) 10 MG tablet Take 5 mg by mouth at bedtime as needed.     . carvedilol (COREG) 3.125 MG tablet Take 1 tablet (3.125 mg total)  by mouth 2 (two) times daily with a meal. 60 tablet 12   No current facility-administered medications for this visit.     Allergies  Allergen Reactions  . Aricept [Donepezil Hcl] Other (See Comments)    Stomach upset    Social History   Social History  . Marital status: Married    Spouse name: N/A  . Number of children: 4  . Years of education: HS   Occupational History  . Retired    Social History Main Topics  . Smoking status: Former Smoker    Years: 16.00    Types: Cigarettes  . Smokeless tobacco: Never Used     Comment: "not smoked for 50 years"  . Alcohol use No  . Drug use: No  . Sexual activity: Not on file   Other Topics Concern  . Not on file   Social History Narrative   Lives at home with his wife.   Right-handed.   No caffeine use.     Review of  Systems: General: negative for chills, fever, night sweats or weight changes.  Cardiovascular: negative for chest pain, dyspnea on exertion, edema, orthopnea, palpitations, paroxysmal nocturnal dyspnea or shortness of breath Dermatological: negative for rash Respiratory: negative for cough or wheezing Urologic: negative for hematuria Abdominal: negative for nausea, vomiting, diarrhea, bright red blood per rectum, melena, or hematemesis Neurologic: negative for visual changes, syncope, or dizziness All other systems reviewed and are otherwise negative except as noted above.    Blood pressure 100/60, pulse 76, height 5\' 7"  (1.702 m), weight 135 lb 3.2 oz (61.3 kg).  General appearance: alert, cooperative and no distress Neck: no carotid bruit and no JVD Lungs: clear to auscultation bilaterally Heart: regular rate and rhythm Skin: Skin color, texture, turgor normal. No rashes or lesions Neurologic: Grossly normal  EKG NSR, NSST changes, PACs  ASSESSMENT AND PLAN:   Atherosclerotic heart disease of native coronary artery with angina pectoris (HCC) Seen as an OP and set up for cath 01/14/16  Dyslipidemia, goal LDL below 70 On high dose statin rx  Essential hypertension Usually well controlled with medication   PLAN  Pt was seen today with his wife. I reviewed his cath images with them. Same Rx. F/U with dr Stanford Breed in Jan -lipids and Cmet before that visit.   [name] PA-C 01/30/2016 4:44 PM

## 2016-01-30 NOTE — Patient Instructions (Addendum)
Medication Instructions:  Your physician recommends that you continue on your current medications as directed. Please refer to the Current Medication list given to you today.   Labwork: 3 MONTHS:  FASTING LIPID & CMET  Testing/Procedures: None ordered  Follow-Up: Your physician recommends that you schedule a follow-up appointment in: 3 MONTHS WITH DR. CRENSHAW 1-2 DAYS AFTER LAB WORK IS DONE   Any Other Special Instructions Will Be Listed Below (If Applicable).     If you need a refill on your cardiac medications before your next appointment, please call your pharmacy.

## 2016-01-31 DIAGNOSIS — F0390 Unspecified dementia without behavioral disturbance: Secondary | ICD-10-CM | POA: Diagnosis not present

## 2016-01-31 DIAGNOSIS — R454 Irritability and anger: Secondary | ICD-10-CM | POA: Diagnosis not present

## 2016-02-11 DIAGNOSIS — R972 Elevated prostate specific antigen [PSA]: Secondary | ICD-10-CM | POA: Diagnosis not present

## 2016-02-11 DIAGNOSIS — C61 Malignant neoplasm of prostate: Secondary | ICD-10-CM | POA: Diagnosis not present

## 2016-02-11 DIAGNOSIS — R9721 Rising PSA following treatment for malignant neoplasm of prostate: Secondary | ICD-10-CM | POA: Diagnosis not present

## 2016-02-11 DIAGNOSIS — N393 Stress incontinence (female) (male): Secondary | ICD-10-CM | POA: Diagnosis not present

## 2016-02-11 DIAGNOSIS — N528 Other male erectile dysfunction: Secondary | ICD-10-CM | POA: Diagnosis not present

## 2016-02-13 DIAGNOSIS — M19031 Primary osteoarthritis, right wrist: Secondary | ICD-10-CM | POA: Diagnosis not present

## 2016-02-13 DIAGNOSIS — M25432 Effusion, left wrist: Secondary | ICD-10-CM | POA: Diagnosis not present

## 2016-02-27 ENCOUNTER — Ambulatory Visit: Payer: Medicare Other | Admitting: Neurology

## 2016-03-04 ENCOUNTER — Encounter: Payer: Self-pay | Admitting: Neurology

## 2016-03-04 ENCOUNTER — Ambulatory Visit (INDEPENDENT_AMBULATORY_CARE_PROVIDER_SITE_OTHER): Payer: Medicare Other | Admitting: Neurology

## 2016-03-04 VITALS — BP 119/79 | HR 67 | Ht 67.0 in | Wt 135.0 lb

## 2016-03-04 DIAGNOSIS — G3184 Mild cognitive impairment, so stated: Secondary | ICD-10-CM

## 2016-03-04 MED ORDER — MEMANTINE HCL-DONEPEZIL HCL ER 28-10 MG PO CP24: 1.0000 | ORAL_CAPSULE | Freq: Every day | ORAL | 11 refills | Status: DC
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
## 2016-03-19 ENCOUNTER — Other Ambulatory Visit: Payer: Self-pay | Admitting: *Deleted

## 2016-03-19 ENCOUNTER — Telehealth: Payer: Self-pay | Admitting: Neurology

## 2016-03-19 ENCOUNTER — Encounter: Payer: Self-pay | Admitting: *Deleted

## 2016-03-19 MED ORDER — MEMANTINE HCL 5 MG PO TABS: ORAL_TABLET | ORAL | 0 refills | Status: DC

## 2016-03-19 MED ORDER — DONEPEZIL HCL 10 MG PO TABS: 10.0000 mg | ORAL_TABLET | Freq: Every day | ORAL | 0 refills | Status: DC

## 2016-03-19 NOTE — Telephone Encounter (Signed)
Returned call - states he discontinued Namzaric due to intolerable nausea and vomiting.  He is willing to retry donepezil and memantine separately.  Spoke to Dr. Felecia Shelling (work-in MD) and he gave the following verbal orders: 1) send in rx for donepezil 10mg , BID 2) send in rx for memantine 5mg  with slow titration instructions (reflected on prescription).  Once complete w/ titration, they have been instructed to call our office to let us know how he is doing on the medications.  If memantine is well tolerated, send in new rx for 10mg , BID. Reviewed these instructions with patient and his wife.  They are both agreeable to this plan and verbalized understanding.

## 2016-03-19 NOTE — Telephone Encounter (Signed)
Patient called stating he has not been taking Namzaric due to making him sick and making him not being able to sleep at night.  Only took 2-3 times per patient.  Please call

## 2016-03-25 DIAGNOSIS — Z96611 Presence of right artificial shoulder joint: Secondary | ICD-10-CM | POA: Diagnosis not present

## 2016-03-25 DIAGNOSIS — M25562 Pain in left knee: Secondary | ICD-10-CM | POA: Diagnosis not present

## 2016-03-27 ENCOUNTER — Telehealth (HOSPITAL_COMMUNITY): Payer: Self-pay | Admitting: *Deleted

## 2016-03-27 ENCOUNTER — Inpatient Hospital Stay (HOSPITAL_COMMUNITY): Admission: RE | Admit: 2016-03-27 | Payer: Medicare Other | Source: Ambulatory Visit

## 2016-03-27 NOTE — Telephone Encounter (Signed)
Pt no show for appt for orientation.  Pt called and left message for pt to reschedule his orientation appt.  Contact information provided. Cherre Huger, BSN

## 2016-03-28 ENCOUNTER — Ambulatory Visit (HOSPITAL_COMMUNITY): Payer: Medicare Other

## 2016-03-31 ENCOUNTER — Ambulatory Visit (HOSPITAL_COMMUNITY): Payer: Medicare Other

## 2016-04-02 ENCOUNTER — Ambulatory Visit (HOSPITAL_COMMUNITY): Payer: Medicare Other

## 2016-04-04 ENCOUNTER — Ambulatory Visit (HOSPITAL_COMMUNITY): Payer: Medicare Other

## 2016-04-07 ENCOUNTER — Inpatient Hospital Stay (HOSPITAL_COMMUNITY): Admission: RE | Admit: 2016-04-07 | Payer: Medicare Other | Source: Ambulatory Visit

## 2016-04-08 ENCOUNTER — Encounter (HOSPITAL_COMMUNITY)
Admission: RE | Admit: 2016-04-08 | Discharge: 2016-04-08 | Disposition: A | Discharge status: Home / Self Care | Payer: Medicare Other | Source: Ambulatory Visit | Attending: Cardiology | Admitting: Cardiology

## 2016-04-08 ENCOUNTER — Encounter (HOSPITAL_COMMUNITY): Payer: Self-pay

## 2016-04-08 VITALS — BP 155/66 | HR 69 | Ht 66.0 in | Wt 138.0 lb

## 2016-04-08 DIAGNOSIS — Z9889 Other specified postprocedural states: Secondary | ICD-10-CM | POA: Diagnosis not present

## 2016-04-08 DIAGNOSIS — Z955 Presence of coronary angioplasty implant and graft: Secondary | ICD-10-CM | POA: Diagnosis not present

## 2016-04-08 HISTORY — DX: Atherosclerotic heart disease of native coronary artery without angina pectoris: I25.10
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
## 2016-04-08 NOTE — Progress Notes (Signed)
Cardiac Rehab Medication Review by a Pharmacist  Does the patient  feel that his/her medications are working for him/her? Yes  Has the patient been experiencing any side effects to the medications prescribed? No  Does the patient measure his/her own blood pressure or blood glucose at home?  No   Does the patient have any problems obtaining medications due to transportation or finances?   No  Understanding of regimen: Good Understanding of indications: Good Potential of compliance: Good   Pharmacist comments:   Patient presents ambulating unassisted and in good spirits today. Medication indications, dosing, frequency, and notable side effects reviewed with patient. Patient curious about resuming tumeric. I suggested to speak with his PCP before doing this as supplements can often interact with medications and PCP should be aware before making these changes. Patient also verbalized has never had or needed nitro tablets. The patient reports has just "never needed it", although I am unsure if there is another reason he has not had these at home. Will report this information to cardiac rehab nursing for follow-up with provider. Time offered for discussion with questions. No further questions or concerns at conclusion of our visit.    Argie Ramming, PharmD Pharmacy Resident  Pager [phone] 04/08/16 8:52 AM

## 2016-04-09 ENCOUNTER — Ambulatory Visit (HOSPITAL_COMMUNITY): Payer: Medicare Other

## 2016-04-11 ENCOUNTER — Ambulatory Visit (HOSPITAL_COMMUNITY): Payer: Medicare Other

## 2016-04-14 ENCOUNTER — Ambulatory Visit (HOSPITAL_COMMUNITY): Payer: Medicare Other

## 2016-04-14 ENCOUNTER — Encounter (HOSPITAL_COMMUNITY): Admission: RE | Admit: 2016-04-14 | Payer: Medicare Other | Source: Ambulatory Visit

## 2016-04-16 ENCOUNTER — Encounter (HOSPITAL_COMMUNITY): Payer: Medicare Other

## 2016-04-16 ENCOUNTER — Ambulatory Visit (HOSPITAL_COMMUNITY): Payer: Medicare Other

## 2016-04-17 ENCOUNTER — Telehealth (HOSPITAL_COMMUNITY): Payer: Self-pay | Admitting: *Deleted

## 2016-04-18 ENCOUNTER — Encounter (HOSPITAL_COMMUNITY): Payer: Medicare Other

## 2016-04-18 ENCOUNTER — Ambulatory Visit (HOSPITAL_COMMUNITY): Payer: Medicare Other

## 2016-04-21 ENCOUNTER — Encounter (HOSPITAL_COMMUNITY): Payer: Medicare Other

## 2016-04-21 ENCOUNTER — Ambulatory Visit (HOSPITAL_COMMUNITY): Payer: Medicare Other

## 2016-04-23 ENCOUNTER — Ambulatory Visit (HOSPITAL_COMMUNITY): Payer: Medicare Other

## 2016-04-23 ENCOUNTER — Telehealth (HOSPITAL_COMMUNITY): Payer: Self-pay | Admitting: *Deleted

## 2016-04-23 ENCOUNTER — Encounter (HOSPITAL_COMMUNITY): Payer: Medicare Other

## 2016-04-24 ENCOUNTER — Telehealth: Payer: Self-pay | Admitting: *Deleted

## 2016-04-24 NOTE — Telephone Encounter (Signed)
[place]

## 2016-04-24 NOTE — Telephone Encounter (Signed)
-----   Message from Magda Kiel, RN sent at 04/24/2016  9:02 AM EST ----- Regarding: cardiac rehab Good morning Hilda Blades,  I want to let Dr Stanford Breed know that Mr Jerabek is not going to participate in cardiac rehab.  Mr Bruster attended orientation but kept forgetting to come to the exercise sessions.   Mr Kleinhans is walking on his own.   Thanks   Have a great day!  Verdis Frederickson

## 2016-04-25 ENCOUNTER — Ambulatory Visit (HOSPITAL_COMMUNITY): Payer: Medicare Other

## 2016-04-25 ENCOUNTER — Encounter (HOSPITAL_COMMUNITY): Payer: Medicare Other
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
## 2016-04-28 ENCOUNTER — Encounter (HOSPITAL_COMMUNITY): Payer: Medicare Other

## 2016-04-28 ENCOUNTER — Ambulatory Visit (HOSPITAL_COMMUNITY): Payer: Medicare Other

## 2016-04-30 ENCOUNTER — Ambulatory Visit (HOSPITAL_COMMUNITY): Payer: Medicare Other

## 2016-04-30 ENCOUNTER — Encounter: Payer: Self-pay | Admitting: Cardiology

## 2016-04-30 ENCOUNTER — Encounter (HOSPITAL_COMMUNITY): Payer: Medicare Other

## 2016-04-30 ENCOUNTER — Ambulatory Visit (INDEPENDENT_AMBULATORY_CARE_PROVIDER_SITE_OTHER): Payer: Medicare Other | Admitting: Cardiology

## 2016-04-30 VITALS — BP 150/60 | HR 60 | Ht 67.0 in | Wt 138.2 lb

## 2016-04-30 DIAGNOSIS — I2510 Atherosclerotic heart disease of native coronary artery without angina pectoris: Secondary | ICD-10-CM

## 2016-04-30 DIAGNOSIS — E7800 Pure hypercholesterolemia, unspecified: Secondary | ICD-10-CM | POA: Diagnosis not present

## 2016-04-30 DIAGNOSIS — I10 Essential (primary) hypertension: Secondary | ICD-10-CM | POA: Diagnosis not present

## 2016-04-30 MED ORDER — CARVEDILOL 6.25 MG PO TABS: 6.2500 mg | ORAL_TABLET | Freq: Two times a day (BID) | ORAL | 3 refills | Status: DC

## 2016-04-30 NOTE — Patient Instructions (Signed)
Medication Instructions:   INCREASE CARVEDILOL TO 6.25 MG TWICE DAILY= 2 OF THE 3.125 MG TABLETS TWICE DAILY  Labwork:  Your physician recommends that you HAVE LAB WORK TODAY  Follow-Up:  Your physician wants you to follow-up in: [place] will receive a reminder letter in the mail two months in advance. If you don't receive a letter, please call our office to schedule the follow-up appointment.   If you need a refill on your cardiac medications before your next appointment, please call your pharmacy.

## 2016-05-01 DIAGNOSIS — G4700 Insomnia, unspecified: Secondary | ICD-10-CM | POA: Diagnosis not present

## 2016-05-01 DIAGNOSIS — I10 Essential (primary) hypertension: Secondary | ICD-10-CM | POA: Diagnosis not present

## 2016-05-01 DIAGNOSIS — F0390 Unspecified dementia without behavioral disturbance: Secondary | ICD-10-CM | POA: Diagnosis not present

## 2016-05-01 DIAGNOSIS — R454 Irritability and anger: Secondary | ICD-10-CM | POA: Diagnosis not present

## 2016-05-02 ENCOUNTER — Encounter (HOSPITAL_COMMUNITY): Payer: Medicare Other

## 2016-05-02 ENCOUNTER — Ambulatory Visit (HOSPITAL_COMMUNITY): Payer: Medicare Other

## 2016-05-05 ENCOUNTER — Encounter (HOSPITAL_COMMUNITY): Payer: Medicare Other

## 2016-05-05 ENCOUNTER — Ambulatory Visit (HOSPITAL_COMMUNITY): Payer: Medicare Other

## 2016-05-07 ENCOUNTER — Encounter (HOSPITAL_COMMUNITY): Payer: Medicare Other

## 2016-05-07 ENCOUNTER — Ambulatory Visit (HOSPITAL_COMMUNITY): Payer: Medicare Other

## 2016-05-09 ENCOUNTER — Encounter (HOSPITAL_COMMUNITY): Payer: Medicare Other

## 2016-05-09 ENCOUNTER — Ambulatory Visit (HOSPITAL_COMMUNITY): Payer: Medicare Other

## 2016-05-12 ENCOUNTER — Ambulatory Visit (HOSPITAL_COMMUNITY): Payer: Medicare Other

## 2016-05-12 ENCOUNTER — Encounter (HOSPITAL_COMMUNITY): Payer: Medicare Other

## 2016-05-14 ENCOUNTER — Encounter (HOSPITAL_COMMUNITY): Payer: Medicare Other

## 2016-05-14 ENCOUNTER — Ambulatory Visit (HOSPITAL_COMMUNITY): Payer: Medicare Other

## 2016-05-16 ENCOUNTER — Encounter (HOSPITAL_COMMUNITY): Payer: Medicare Other

## 2016-05-16 ENCOUNTER — Ambulatory Visit (HOSPITAL_COMMUNITY): Payer: Medicare Other

## 2016-05-19 ENCOUNTER — Encounter (HOSPITAL_COMMUNITY): Payer: Medicare Other

## 2016-05-19 ENCOUNTER — Ambulatory Visit (HOSPITAL_COMMUNITY): Payer: Medicare Other

## 2016-05-21 ENCOUNTER — Ambulatory Visit (HOSPITAL_COMMUNITY): Payer: Medicare Other

## 2016-05-21 ENCOUNTER — Encounter (HOSPITAL_COMMUNITY): Payer: Medicare Other

## 2016-05-23 ENCOUNTER — Ambulatory Visit (HOSPITAL_COMMUNITY): Payer: Medicare Other

## 2016-05-23 ENCOUNTER — Encounter (HOSPITAL_COMMUNITY): Payer: Medicare Other

## 2016-05-26 ENCOUNTER — Encounter (HOSPITAL_COMMUNITY): Payer: Medicare Other

## 2016-05-26 ENCOUNTER — Ambulatory Visit (HOSPITAL_COMMUNITY): Payer: Medicare Other

## 2016-05-28 ENCOUNTER — Ambulatory Visit (HOSPITAL_COMMUNITY): Payer: Medicare Other

## 2016-05-28 ENCOUNTER — Encounter (HOSPITAL_COMMUNITY): Payer: Medicare Other

## 2016-05-30 ENCOUNTER — Ambulatory Visit (HOSPITAL_COMMUNITY): Payer: Medicare Other

## 2016-05-30 ENCOUNTER — Encounter: Payer: Self-pay | Admitting: *Deleted

## 2016-05-30 ENCOUNTER — Encounter (HOSPITAL_COMMUNITY): Payer: Medicare Other

## 2016-06-02 ENCOUNTER — Ambulatory Visit (HOSPITAL_COMMUNITY): Payer: Medicare Other

## 2016-06-02 ENCOUNTER — Encounter (HOSPITAL_COMMUNITY): Payer: Medicare Other

## 2016-06-04 ENCOUNTER — Encounter (HOSPITAL_COMMUNITY): Payer: Medicare Other

## 2016-06-04 ENCOUNTER — Ambulatory Visit (HOSPITAL_COMMUNITY): Payer: Medicare Other

## 2016-06-06 ENCOUNTER — Ambulatory Visit (HOSPITAL_COMMUNITY): Payer: Medicare Other

## 2016-06-06 ENCOUNTER — Encounter (HOSPITAL_COMMUNITY): Payer: Medicare Other

## 2016-06-09 ENCOUNTER — Encounter (HOSPITAL_COMMUNITY): Payer: Medicare Other

## 2016-06-09 ENCOUNTER — Ambulatory Visit (HOSPITAL_COMMUNITY): Payer: Medicare Other

## 2016-06-11 ENCOUNTER — Ambulatory Visit (HOSPITAL_COMMUNITY): Payer: Medicare Other

## 2016-06-11 ENCOUNTER — Encounter (HOSPITAL_COMMUNITY): Payer: Medicare Other

## 2016-06-13 ENCOUNTER — Ambulatory Visit (HOSPITAL_COMMUNITY): Payer: Medicare Other

## 2016-06-13 ENCOUNTER — Encounter (HOSPITAL_COMMUNITY): Payer: Medicare Other

## 2016-06-16 ENCOUNTER — Ambulatory Visit (HOSPITAL_COMMUNITY): Payer: Medicare Other

## 2016-06-16 ENCOUNTER — Encounter (HOSPITAL_COMMUNITY): Payer: Medicare Other

## 2016-06-18 ENCOUNTER — Encounter (HOSPITAL_COMMUNITY): Payer: Medicare Other

## 2016-06-18 ENCOUNTER — Ambulatory Visit (HOSPITAL_COMMUNITY): Payer: Medicare Other

## 2016-06-20 ENCOUNTER — Encounter (HOSPITAL_COMMUNITY): Payer: Medicare Other

## 2016-06-20 ENCOUNTER — Ambulatory Visit (HOSPITAL_COMMUNITY): Payer: Medicare Other

## 2016-06-23 ENCOUNTER — Ambulatory Visit (HOSPITAL_COMMUNITY): Payer: Medicare Other

## 2016-06-23 ENCOUNTER — Encounter (HOSPITAL_COMMUNITY): Payer: Medicare Other

## 2016-06-25 ENCOUNTER — Ambulatory Visit (HOSPITAL_COMMUNITY): Payer: Medicare Other

## 2016-06-25 ENCOUNTER — Encounter (HOSPITAL_COMMUNITY): Payer: Medicare Other

## 2016-06-27 ENCOUNTER — Encounter (HOSPITAL_COMMUNITY): Payer: Medicare Other

## 2016-06-27 ENCOUNTER — Ambulatory Visit (HOSPITAL_COMMUNITY): Payer: Medicare Other

## 2016-06-30 ENCOUNTER — Encounter (HOSPITAL_COMMUNITY): Payer: Medicare Other

## 2016-06-30 ENCOUNTER — Ambulatory Visit (HOSPITAL_COMMUNITY): Payer: Medicare Other

## 2016-07-01 DIAGNOSIS — I2510 Atherosclerotic heart disease of native coronary artery without angina pectoris: Secondary | ICD-10-CM | POA: Diagnosis not present

## 2016-07-02 ENCOUNTER — Ambulatory Visit (HOSPITAL_COMMUNITY): Payer: Medicare Other

## 2016-07-02 ENCOUNTER — Encounter (HOSPITAL_COMMUNITY): Payer: Medicare Other

## 2016-07-02 ENCOUNTER — Telehealth: Payer: Self-pay | Admitting: *Deleted

## 2016-07-02 DIAGNOSIS — E7800 Pure hypercholesterolemia, unspecified: Secondary | ICD-10-CM

## 2016-07-02 LAB — BASIC METABOLIC PANEL
BUN: 22 mg/dL (ref 7–25)
CALCIUM: 9.1 mg/dL (ref 8.6–10.3)
CO2: 27 mmol/L (ref 20–31)
Chloride: 105 mmol/L (ref 98–110)
Creat: 1.21 mg/dL — ABNORMAL HIGH (ref 0.70–1.11)
Glucose, Bld: 106 mg/dL — ABNORMAL HIGH (ref 65–99)
POTASSIUM: 4 mmol/L (ref 3.5–5.3)
SODIUM: 141 mmol/L (ref 135–146)

## 2016-07-02 LAB — HEPATIC FUNCTION PANEL
ALT: 14 U/L (ref 9–46)
AST: 18 U/L (ref 10–35)
Albumin: 3.9 g/dL (ref 3.6–5.1)
Alkaline Phosphatase: 63 U/L (ref 40–115)
Bilirubin, Direct: 0.1 mg/dL (ref <=0.2)
Indirect Bilirubin: 0.6 mg/dL (ref 0.2–1.2)
TOTAL PROTEIN: 6.2 g/dL (ref 6.1–8.1)
Total Bilirubin: 0.7 mg/dL (ref 0.2–1.2)

## 2016-07-02 LAB — LIPID PANEL
CHOL/HDL RATIO: 4.5 ratio (ref <5.0)
CHOLESTEROL: 186 mg/dL (ref <200)
HDL: 41 mg/dL (ref >40)
LDL Cholesterol: 102 mg/dL — ABNORMAL HIGH (ref <100)
TRIGLYCERIDES: 217 mg/dL — AB (ref <150)
VLDL: 43 mg/dL — ABNORMAL HIGH (ref <30)

## 2016-07-02 MED ORDER — ATORVASTATIN CALCIUM 80 MG PO TABS: 80.0000 mg | ORAL_TABLET | Freq: Every day | ORAL | 3 refills | Status: DC

## 2016-07-02 NOTE — Telephone Encounter (Addendum)
-----   Message from Lelon Perla, MD sent at 07/02/2016  7:24 AM EDT ----- Change lipitor to 80 mg daily; lipids and liver 6 weeks Brian Crenshaw  Spoke with pt, Aware of dr Jacalyn Lefevre recommendations. New script sent to the pharmacy and Lab orders mailed to the pt

## 2016-07-04 ENCOUNTER — Encounter (HOSPITAL_COMMUNITY): Payer: Medicare Other

## 2016-07-04 ENCOUNTER — Ambulatory Visit (HOSPITAL_COMMUNITY): Payer: Medicare Other

## 2016-07-07 ENCOUNTER — Ambulatory Visit (HOSPITAL_COMMUNITY): Payer: Medicare Other

## 2016-07-07 ENCOUNTER — Encounter (HOSPITAL_COMMUNITY): Payer: Medicare Other

## 2016-07-09 ENCOUNTER — Encounter (HOSPITAL_COMMUNITY): Payer: Medicare Other

## 2016-07-09 ENCOUNTER — Ambulatory Visit (HOSPITAL_COMMUNITY): Payer: Medicare Other

## 2016-07-09 ENCOUNTER — Telehealth: Payer: Self-pay | Admitting: Cardiology

## 2016-07-09 NOTE — Telephone Encounter (Signed)
New message    Pt is calling to let RN know that he hasn't been taking his atorvastatin. He said he thinks they believe he has been and he is not.

## 2016-07-09 NOTE — Telephone Encounter (Signed)
Forward to Hilda Blades to follow up  patient aware

## 2016-07-09 NOTE — Telephone Encounter (Signed)
Spoke with patient . Patient states he is restarting atorvastatin 40 mg-  RN informed patient restart and have labs done as stated in letter.    Hilda Blades will call to confirm

## 2016-07-11 ENCOUNTER — Encounter (HOSPITAL_COMMUNITY): Payer: Medicare Other

## 2016-07-11 ENCOUNTER — Ambulatory Visit (HOSPITAL_COMMUNITY): Payer: Medicare Other

## 2016-07-11 MED ORDER — ATORVASTATIN CALCIUM 40 MG PO TABS: 40.0000 mg | ORAL_TABLET | Freq: Every day | ORAL | Status: DC

## 2016-07-11 NOTE — Telephone Encounter (Signed)
Spoke with pt, he just forgot to take the atorvastatin. He will restart at 40 mg daily.

## 2016-07-14 ENCOUNTER — Encounter (HOSPITAL_COMMUNITY): Payer: Medicare Other

## 2016-07-14 ENCOUNTER — Ambulatory Visit (HOSPITAL_COMMUNITY): Payer: Medicare Other

## 2016-07-16 ENCOUNTER — Encounter (HOSPITAL_COMMUNITY): Payer: Medicare Other

## 2016-07-16 ENCOUNTER — Ambulatory Visit (HOSPITAL_COMMUNITY): Payer: Medicare Other

## 2016-07-18 ENCOUNTER — Encounter (HOSPITAL_COMMUNITY): Payer: Medicare Other

## 2016-07-18 ENCOUNTER — Ambulatory Visit (HOSPITAL_COMMUNITY): Payer: Medicare Other

## 2016-07-21 ENCOUNTER — Encounter (HOSPITAL_COMMUNITY): Payer: Medicare Other

## 2016-07-21 ENCOUNTER — Ambulatory Visit (HOSPITAL_COMMUNITY): Payer: Medicare Other

## 2016-07-23 ENCOUNTER — Encounter (HOSPITAL_COMMUNITY): Payer: Medicare Other

## 2016-07-23 ENCOUNTER — Ambulatory Visit (HOSPITAL_COMMUNITY): Payer: Medicare Other

## 2016-07-25 ENCOUNTER — Ambulatory Visit (HOSPITAL_COMMUNITY): Payer: Medicare Other

## 2016-07-25 ENCOUNTER — Encounter (HOSPITAL_COMMUNITY): Payer: Medicare Other

## 2016-07-28 ENCOUNTER — Encounter (HOSPITAL_COMMUNITY): Payer: Medicare Other

## 2016-07-30 ENCOUNTER — Encounter (HOSPITAL_COMMUNITY): Payer: Medicare Other

## 2016-08-01 ENCOUNTER — Encounter (HOSPITAL_COMMUNITY): Payer: Medicare Other

## 2016-08-04 ENCOUNTER — Encounter (HOSPITAL_COMMUNITY): Payer: Medicare Other

## 2016-08-06 ENCOUNTER — Encounter (HOSPITAL_COMMUNITY): Payer: Medicare Other

## 2016-08-08 ENCOUNTER — Encounter (HOSPITAL_COMMUNITY): Payer: Medicare Other

## 2016-08-11 ENCOUNTER — Encounter (HOSPITAL_COMMUNITY): Payer: Medicare Other

## 2016-08-29 DIAGNOSIS — N528 Other male erectile dysfunction: Secondary | ICD-10-CM | POA: Diagnosis not present

## 2016-08-29 DIAGNOSIS — N393 Stress incontinence (female) (male): Secondary | ICD-10-CM | POA: Diagnosis not present

## 2016-08-29 DIAGNOSIS — R972 Elevated prostate specific antigen [PSA]: Secondary | ICD-10-CM | POA: Diagnosis not present

## 2016-08-29 DIAGNOSIS — R9721 Rising PSA following treatment for malignant neoplasm of prostate: Secondary | ICD-10-CM | POA: Diagnosis not present

## 2016-08-29 DIAGNOSIS — C61 Malignant neoplasm of prostate: Secondary | ICD-10-CM | POA: Diagnosis not present

## 2016-09-01 ENCOUNTER — Encounter: Payer: Self-pay | Admitting: *Deleted

## 2016-09-03 ENCOUNTER — Ambulatory Visit: Payer: Medicare Other | Admitting: Neurology

## 2016-09-12 ENCOUNTER — Telehealth: Payer: Self-pay | Admitting: Cardiology

## 2016-09-12 ENCOUNTER — Other Ambulatory Visit: Payer: Self-pay

## 2016-09-12 MED ORDER — ATORVASTATIN CALCIUM 40 MG PO TABS: 40.0000 mg | ORAL_TABLET | Freq: Every day | ORAL | 11 refills | Status: DC

## 2016-09-12 NOTE — Telephone Encounter (Signed)
°*  STAT* If patient is at the pharmacy, call can be transferred to refill team.   1. Which medications need to be refilled? (please list name of each medication and dose if known) Atorvastatin( Needs a New Prescription Sent)   2. Which pharmacy/location (including street and city if local pharmacy) is medication to be sent to?[facility]  [address]   3. Do they need a 30 day or 90 day supply? [place]

## 2016-09-22 ENCOUNTER — Encounter: Payer: Self-pay | Admitting: Neurology

## 2016-09-22 ENCOUNTER — Ambulatory Visit (INDEPENDENT_AMBULATORY_CARE_PROVIDER_SITE_OTHER): Payer: Medicare Other | Admitting: Neurology

## 2016-09-22 VITALS — BP 148/91 | HR 64 | Ht 67.0 in | Wt 141.0 lb

## 2016-09-22 DIAGNOSIS — G3184 Mild cognitive impairment, so stated: Secondary | ICD-10-CM

## 2016-09-22 DIAGNOSIS — I2510 Atherosclerotic heart disease of native coronary artery without angina pectoris: Secondary | ICD-10-CM

## 2016-09-22 MED ORDER — MEMANTINE HCL 10 MG PO TABS: 10.0000 mg | ORAL_TABLET | Freq: Two times a day (BID) | ORAL | 11 refills | Status: DC
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
## 2016-09-23 ENCOUNTER — Other Ambulatory Visit: Payer: Self-pay | Admitting: *Deleted

## 2016-09-23 MED ORDER — MEMANTINE HCL 10 MG PO TABS: 10.0000 mg | ORAL_TABLET | Freq: Two times a day (BID) | ORAL | 3 refills | Status: DC

## 2016-11-18 DIAGNOSIS — L570 Actinic keratosis: Secondary | ICD-10-CM | POA: Diagnosis not present

## 2016-11-18 DIAGNOSIS — L821 Other seborrheic keratosis: Secondary | ICD-10-CM | POA: Diagnosis not present

## 2016-11-18 DIAGNOSIS — D229 Melanocytic nevi, unspecified: Secondary | ICD-10-CM | POA: Diagnosis not present

## 2016-11-18 DIAGNOSIS — L723 Sebaceous cyst: Secondary | ICD-10-CM | POA: Diagnosis not present

## 2016-11-27 DIAGNOSIS — I2510 Atherosclerotic heart disease of native coronary artery without angina pectoris: Secondary | ICD-10-CM | POA: Diagnosis not present

## 2016-11-27 DIAGNOSIS — R159 Full incontinence of feces: Secondary | ICD-10-CM | POA: Diagnosis not present

## 2016-11-27 DIAGNOSIS — G4700 Insomnia, unspecified: Secondary | ICD-10-CM | POA: Diagnosis not present

## 2016-11-27 DIAGNOSIS — E782 Mixed hyperlipidemia: Secondary | ICD-10-CM | POA: Diagnosis not present

## 2016-11-27 DIAGNOSIS — I119 Hypertensive heart disease without heart failure: Secondary | ICD-10-CM | POA: Diagnosis not present

## 2016-11-27 DIAGNOSIS — Z0000 Encounter for general adult medical examination without abnormal findings: Secondary | ICD-10-CM | POA: Diagnosis not present

## 2016-11-27 DIAGNOSIS — F0390 Unspecified dementia without behavioral disturbance: Secondary | ICD-10-CM | POA: Diagnosis not present

## 2016-11-27 DIAGNOSIS — C61 Malignant neoplasm of prostate: Secondary | ICD-10-CM | POA: Diagnosis not present

## 2016-11-27 DIAGNOSIS — K219 Gastro-esophageal reflux disease without esophagitis: Secondary | ICD-10-CM | POA: Diagnosis not present

## 2016-11-27 DIAGNOSIS — M150 Primary generalized (osteo)arthritis: Secondary | ICD-10-CM | POA: Diagnosis not present

## 2016-11-27 DIAGNOSIS — R32 Unspecified urinary incontinence: Secondary | ICD-10-CM | POA: Diagnosis not present

## 2016-11-27 DIAGNOSIS — Z23 Encounter for immunization: Secondary | ICD-10-CM | POA: Diagnosis not present

## 2016-12-11 DIAGNOSIS — M25512 Pain in left shoulder: Secondary | ICD-10-CM | POA: Diagnosis not present

## 2017-01-01 ENCOUNTER — Telehealth: Payer: Self-pay | Admitting: Cardiology

## 2017-01-01 NOTE — Telephone Encounter (Signed)
Pt c/o BP issue: STAT if pt c/o blurred vision, one-sided weakness or slurred speech  1. What are your last 5 BP readings? 181/79   2. Are you having any other symptoms (ex. Dizziness, headache, blurred vision, passed out)? Pt wife found him on the kitchen floor passed out   3. What is your BP issue? high

## 2017-01-01 NOTE — Telephone Encounter (Signed)
PAOV; agree with changing coreg to BID Kirk Ruths

## 2017-01-01 NOTE — Telephone Encounter (Signed)
Returned call to wife (ok per DPR) who states that she is concerned about patients BP.   BP has been running 646-803 systolic.  At current it is 176/98 HR 79.   States patient also passed out the other night and she found him on the floor, was unable to arouse him so she called EMS. Wife unsure of vitals when EMS arrived or further details. Patient doesn't believe he passed out, he thinks it was due to taking a whole pill of ambien (10mg ).  Patient states he hardly ever takes the [place] but when he has in the past he only takes 5mg .   Denies SOB, CP, lightheadedness, HA, blurry vision now, prior or after episode.  States he has been a little dizzy when he moves the last couple of days but otherwise feels okay.  Spoke to patient to verify medications.   States he has only been taking his carvedilol in the AM (once daily).     Advised to take his evening dose of carvedilol (Coreg), begin taking it twice daily as prescribed and monitor BP.   Advised due to questionable syncope we may need to have him seen.     Advised I would route to Dr. Stanford Breed for further review/recommendations.

## 2017-01-02 NOTE — Telephone Encounter (Signed)
Returned call and left message (ok per DPR) making aware of Dr. Jacalyn Lefevre recommendations.    Advised to call back to make [place] appointment.

## 2017-01-02 NOTE — Telephone Encounter (Signed)
Patient is scheduled to see Rosaria Ferries PA Tuesday at 11 am.  Wife aware and verbalized understanding.

## 2017-01-06 ENCOUNTER — Encounter: Payer: Self-pay | Admitting: Physician Assistant

## 2017-01-06 ENCOUNTER — Ambulatory Visit (INDEPENDENT_AMBULATORY_CARE_PROVIDER_SITE_OTHER): Payer: Medicare Other | Admitting: Physician Assistant

## 2017-01-06 VITALS — BP 151/94 | HR 76 | Ht 67.0 in | Wt 142.0 lb

## 2017-01-06 DIAGNOSIS — I10 Essential (primary) hypertension: Secondary | ICD-10-CM | POA: Diagnosis not present

## 2017-01-06 DIAGNOSIS — E785 Hyperlipidemia, unspecified: Secondary | ICD-10-CM

## 2017-01-06 DIAGNOSIS — Z9189 Other specified personal risk factors, not elsewhere classified: Secondary | ICD-10-CM

## 2017-01-06 DIAGNOSIS — I2510 Atherosclerotic heart disease of native coronary artery without angina pectoris: Secondary | ICD-10-CM

## 2017-01-06 MED ORDER — AMLODIPINE BESYLATE 5 MG PO TABS
5.0000 mg | ORAL_TABLET | Freq: Every day | ORAL | 11 refills | Status: DC
Start: 2017-01-06 — End: 2018-01-18

## 2017-01-06 NOTE — Patient Instructions (Addendum)
Medication Instructions:  START- Amlodipine 5 mg daily  If you need a refill on your cardiac medications before your next appointment, please call your pharmacy.  Labwork: None Ordered   Testing/Procedures: None Ordered   Follow-Up: Your physician wants you to follow-up in: 3 Months with Dr Stanford Breed. Please come fasting at next appointment with Dr Stanford Breed.   START taking Atorvastatin 40 mg daily at supper Get a PM weekly med box to help remember to take medication If you have a questions please ask wife to help fill med box every week   Thank you for choosing [facility] at [employer]!!
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
## 2017-01-27 DIAGNOSIS — N529 Male erectile dysfunction, unspecified: Secondary | ICD-10-CM | POA: Diagnosis not present

## 2017-01-27 DIAGNOSIS — Z8546 Personal history of malignant neoplasm of prostate: Secondary | ICD-10-CM | POA: Diagnosis not present

## 2017-01-27 DIAGNOSIS — R972 Elevated prostate specific antigen [PSA]: Secondary | ICD-10-CM | POA: Diagnosis not present

## 2017-01-27 DIAGNOSIS — N393 Stress incontinence (female) (male): Secondary | ICD-10-CM | POA: Diagnosis not present

## 2017-01-27 DIAGNOSIS — N35014 Post-traumatic urethral stricture, male, unspecified: Secondary | ICD-10-CM | POA: Diagnosis not present

## 2017-03-25 ENCOUNTER — Encounter: Payer: Self-pay | Admitting: Neurology

## 2017-03-25 ENCOUNTER — Ambulatory Visit (INDEPENDENT_AMBULATORY_CARE_PROVIDER_SITE_OTHER): Payer: Medicare Other | Admitting: Neurology

## 2017-03-25 VITALS — Ht 67.0 in | Wt 140.8 lb

## 2017-03-25 DIAGNOSIS — G3184 Mild cognitive impairment, so stated: Secondary | ICD-10-CM | POA: Diagnosis not present
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
## 2017-04-14 ENCOUNTER — Ambulatory Visit (INDEPENDENT_AMBULATORY_CARE_PROVIDER_SITE_OTHER): Payer: Medicare Other | Admitting: Cardiology

## 2017-04-14 ENCOUNTER — Encounter: Payer: Self-pay | Admitting: Cardiology

## 2017-04-14 VITALS — BP 130/68 | HR 69 | Ht 67.0 in | Wt 137.0 lb

## 2017-04-14 DIAGNOSIS — I10 Essential (primary) hypertension: Secondary | ICD-10-CM | POA: Diagnosis not present

## 2017-04-14 DIAGNOSIS — I2510 Atherosclerotic heart disease of native coronary artery without angina pectoris: Secondary | ICD-10-CM

## 2017-04-14 DIAGNOSIS — E7800 Pure hypercholesterolemia, unspecified: Secondary | ICD-10-CM | POA: Diagnosis not present

## 2017-04-14 LAB — COMPREHENSIVE METABOLIC PANEL
A/G RATIO: 2 (ref 1.2–2.2)
ALBUMIN: 4.5 g/dL (ref 3.5–4.7)
ALK PHOS: 91 IU/L (ref 39–117)
ALT: 18 IU/L (ref 0–44)
AST: 19 IU/L (ref 0–40)
BILIRUBIN TOTAL: 0.8 mg/dL (ref 0.0–1.2)
BUN / CREAT RATIO: 16 (ref 10–24)
BUN: 20 mg/dL (ref 8–27)
CHLORIDE: 101 mmol/L (ref 96–106)
CO2: 22 mmol/L (ref 20–29)
Calcium: 9.6 mg/dL (ref 8.6–10.2)
Creatinine, Ser: 1.26 mg/dL (ref 0.76–1.27)
GFR calc Af Amer: 60 mL/min/{1.73_m2} (ref >59)
GFR calc non Af Amer: 52 mL/min/{1.73_m2} — ABNORMAL LOW (ref >59)
GLOBULIN, TOTAL: 2.3 g/dL (ref 1.5–4.5)
Glucose: 101 mg/dL — ABNORMAL HIGH (ref 65–99)
Potassium: 4.4 mmol/L (ref 3.5–5.2)
SODIUM: 139 mmol/L (ref 134–144)
Total Protein: 6.8 g/dL (ref 6.0–8.5)

## 2017-04-14 LAB — LIPID PANEL
CHOL/HDL RATIO: 3.7 ratio (ref 0.0–5.0)
Cholesterol, Total: 157 mg/dL (ref 100–199)
HDL: 42 mg/dL (ref >39)
LDL Calculated: 73 mg/dL (ref 0–99)
Triglycerides: 209 mg/dL — ABNORMAL HIGH (ref 0–149)
VLDL Cholesterol Cal: 42 mg/dL — ABNORMAL HIGH (ref 5–40)

## 2017-04-14 NOTE — Patient Instructions (Signed)
Medication Instructions:   STOP BRILINTA  Labwork:  Your physician recommends that you HAVE LAB WORK TODAY  Follow-Up:  Your physician wants you to follow-up in: [place] will receive a reminder letter in the mail two months in advance. If you don't receive a letter, please call our office to schedule the follow-up appointment.   If you need a refill on your cardiac medications before your next appointment, please call your pharmacy.

## 2017-04-16 ENCOUNTER — Encounter: Payer: Self-pay | Admitting: *Deleted

## 2017-05-13 DIAGNOSIS — R8290 Unspecified abnormal findings in urine: Secondary | ICD-10-CM | POA: Diagnosis not present

## 2017-05-13 DIAGNOSIS — N529 Male erectile dysfunction, unspecified: Secondary | ICD-10-CM | POA: Diagnosis not present

## 2017-05-13 DIAGNOSIS — R300 Dysuria: Secondary | ICD-10-CM | POA: Diagnosis not present

## 2017-05-13 DIAGNOSIS — Z8546 Personal history of malignant neoplasm of prostate: Secondary | ICD-10-CM | POA: Diagnosis not present

## 2017-05-13 DIAGNOSIS — R972 Elevated prostate specific antigen [PSA]: Secondary | ICD-10-CM | POA: Diagnosis not present

## 2017-05-13 DIAGNOSIS — N35014 Post-traumatic urethral stricture, male, unspecified: Secondary | ICD-10-CM | POA: Diagnosis not present

## 2017-05-13 DIAGNOSIS — N393 Stress incontinence (female) (male): Secondary | ICD-10-CM | POA: Diagnosis not present

## 2017-09-04 DIAGNOSIS — L309 Dermatitis, unspecified: Secondary | ICD-10-CM | POA: Diagnosis not present

## 2017-11-16 DIAGNOSIS — N529 Male erectile dysfunction, unspecified: Secondary | ICD-10-CM | POA: Diagnosis not present

## 2017-11-16 DIAGNOSIS — N35014 Post-traumatic urethral stricture, male, unspecified: Secondary | ICD-10-CM | POA: Diagnosis not present

## 2017-11-16 DIAGNOSIS — R972 Elevated prostate specific antigen [PSA]: Secondary | ICD-10-CM | POA: Diagnosis not present

## 2017-11-16 DIAGNOSIS — C61 Malignant neoplasm of prostate: Secondary | ICD-10-CM | POA: Diagnosis not present

## 2017-12-07 DIAGNOSIS — Z23 Encounter for immunization: Secondary | ICD-10-CM | POA: Diagnosis not present

## 2017-12-16 ENCOUNTER — Other Ambulatory Visit: Payer: Self-pay | Admitting: *Deleted

## 2017-12-16 DIAGNOSIS — F0390 Unspecified dementia without behavioral disturbance: Secondary | ICD-10-CM | POA: Diagnosis not present

## 2017-12-16 DIAGNOSIS — E782 Mixed hyperlipidemia: Secondary | ICD-10-CM | POA: Diagnosis not present

## 2017-12-16 DIAGNOSIS — M542 Cervicalgia: Secondary | ICD-10-CM | POA: Diagnosis not present

## 2017-12-16 DIAGNOSIS — I10 Essential (primary) hypertension: Secondary | ICD-10-CM | POA: Diagnosis not present

## 2017-12-16 DIAGNOSIS — F411 Generalized anxiety disorder: Secondary | ICD-10-CM | POA: Diagnosis not present

## 2017-12-16 MED ORDER — ATORVASTATIN CALCIUM 40 MG PO TABS: 40.0000 mg | ORAL_TABLET | Freq: Every day | ORAL | 3 refills | Status: DC

## 2017-12-21 DIAGNOSIS — Z961 Presence of intraocular lens: Secondary | ICD-10-CM | POA: Diagnosis not present

## 2017-12-21 DIAGNOSIS — H26492 Other secondary cataract, left eye: Secondary | ICD-10-CM | POA: Diagnosis not present

## 2018-01-01 DIAGNOSIS — M542 Cervicalgia: Secondary | ICD-10-CM | POA: Diagnosis not present

## 2018-01-04 DIAGNOSIS — R399 Unspecified symptoms and signs involving the genitourinary system: Secondary | ICD-10-CM | POA: Diagnosis not present

## 2018-01-13 DIAGNOSIS — I119 Hypertensive heart disease without heart failure: Secondary | ICD-10-CM | POA: Diagnosis not present

## 2018-01-13 DIAGNOSIS — F0390 Unspecified dementia without behavioral disturbance: Secondary | ICD-10-CM | POA: Diagnosis not present

## 2018-01-13 DIAGNOSIS — C61 Malignant neoplasm of prostate: Secondary | ICD-10-CM | POA: Diagnosis not present

## 2018-01-13 DIAGNOSIS — E782 Mixed hyperlipidemia: Secondary | ICD-10-CM | POA: Diagnosis not present

## 2018-01-13 DIAGNOSIS — M150 Primary generalized (osteo)arthritis: Secondary | ICD-10-CM | POA: Diagnosis not present

## 2018-01-13 DIAGNOSIS — G4700 Insomnia, unspecified: Secondary | ICD-10-CM | POA: Diagnosis not present

## 2018-01-13 DIAGNOSIS — R159 Full incontinence of feces: Secondary | ICD-10-CM | POA: Diagnosis not present

## 2018-01-13 DIAGNOSIS — F411 Generalized anxiety disorder: Secondary | ICD-10-CM | POA: Diagnosis not present

## 2018-01-13 DIAGNOSIS — K219 Gastro-esophageal reflux disease without esophagitis: Secondary | ICD-10-CM | POA: Diagnosis not present

## 2018-01-13 DIAGNOSIS — I2510 Atherosclerotic heart disease of native coronary artery without angina pectoris: Secondary | ICD-10-CM | POA: Diagnosis not present

## 2018-01-13 DIAGNOSIS — Z23 Encounter for immunization: Secondary | ICD-10-CM | POA: Diagnosis not present

## 2018-01-13 DIAGNOSIS — Z0000 Encounter for general adult medical examination without abnormal findings: Secondary | ICD-10-CM | POA: Diagnosis not present

## 2018-01-18 ENCOUNTER — Other Ambulatory Visit: Payer: Self-pay | Admitting: Physician Assistant
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
## 2018-03-25 ENCOUNTER — Ambulatory Visit (INDEPENDENT_AMBULATORY_CARE_PROVIDER_SITE_OTHER): Payer: Medicare HMO | Admitting: Nurse Practitioner

## 2018-03-25 ENCOUNTER — Encounter: Payer: Self-pay | Admitting: Nurse Practitioner

## 2018-03-25 VITALS — BP 136/86 | HR 74 | Ht 67.0 in | Wt 141.4 lb

## 2018-03-25 DIAGNOSIS — G3184 Mild cognitive impairment, so stated: Secondary | ICD-10-CM

## 2018-03-25 DIAGNOSIS — Z820 Family history of epilepsy and other diseases of the nervous system: Secondary | ICD-10-CM

## 2018-03-25 MED ORDER — MEMANTINE HCL 10 MG PO TABS: 10.0000 mg | ORAL_TABLET | Freq: Two times a day (BID) | ORAL | 3 refills | Status: DC

## 2018-03-25 NOTE — Progress Notes (Signed)
I have reviewed and agreed above plan. 

## 2018-03-25 NOTE — Patient Instructions (Signed)
Continue Namenda 10mg  twice daily  Exercise every day by walking   Word search cross word puzzles for memory  F/U yearly

## 2018-04-22 DIAGNOSIS — M542 Cervicalgia: Secondary | ICD-10-CM | POA: Diagnosis not present

## 2018-06-16 ENCOUNTER — Telehealth: Payer: Self-pay | Admitting: *Deleted

## 2018-06-16 NOTE — Telephone Encounter (Signed)
06/16/18  LMOM @ 11:48 am, re: follow up appointment.

## 2018-08-09 DIAGNOSIS — M542 Cervicalgia: Secondary | ICD-10-CM | POA: Diagnosis not present

## 2018-08-09 DIAGNOSIS — R293 Abnormal posture: Secondary | ICD-10-CM | POA: Diagnosis not present

## 2018-08-23 DIAGNOSIS — N35014 Post-traumatic urethral stricture, male, unspecified: Secondary | ICD-10-CM | POA: Diagnosis not present

## 2018-08-23 DIAGNOSIS — R9721 Rising PSA following treatment for malignant neoplasm of prostate: Secondary | ICD-10-CM | POA: Diagnosis not present

## 2018-08-23 DIAGNOSIS — N5231 Erectile dysfunction following radical prostatectomy: Secondary | ICD-10-CM | POA: Diagnosis not present

## 2018-09-27 DIAGNOSIS — H353122 Nonexudative age-related macular degeneration, left eye, intermediate dry stage: Secondary | ICD-10-CM | POA: Diagnosis not present

## 2018-09-27 DIAGNOSIS — H43813 Vitreous degeneration, bilateral: Secondary | ICD-10-CM | POA: Diagnosis not present

## 2018-09-27 DIAGNOSIS — H35372 Puckering of macula, left eye: Secondary | ICD-10-CM | POA: Diagnosis not present

## 2018-09-27 DIAGNOSIS — H353211 Exudative age-related macular degeneration, right eye, with active choroidal neovascularization: Secondary | ICD-10-CM | POA: Diagnosis not present

## 2018-10-06 DIAGNOSIS — R6889 Other general symptoms and signs: Secondary | ICD-10-CM | POA: Diagnosis not present

## 2019-01-11 DIAGNOSIS — H35423 Microcystoid degeneration of retina, bilateral: Secondary | ICD-10-CM | POA: Diagnosis not present

## 2019-01-11 DIAGNOSIS — H35372 Puckering of macula, left eye: Secondary | ICD-10-CM | POA: Diagnosis not present

## 2019-01-11 DIAGNOSIS — H353122 Nonexudative age-related macular degeneration, left eye, intermediate dry stage: Secondary | ICD-10-CM | POA: Diagnosis not present

## 2019-01-11 DIAGNOSIS — H353211 Exudative age-related macular degeneration, right eye, with active choroidal neovascularization: Secondary | ICD-10-CM | POA: Diagnosis not present

## 2019-01-14 ENCOUNTER — Emergency Department (HOSPITAL_COMMUNITY): Payer: Medicare HMO

## 2019-01-14 ENCOUNTER — Encounter (HOSPITAL_COMMUNITY): Payer: Self-pay | Admitting: Emergency Medicine

## 2019-01-14 ENCOUNTER — Emergency Department (HOSPITAL_COMMUNITY)
Admission: EM | Admit: 2019-01-14 | Discharge: 2019-01-14 | Disposition: A | Discharge status: Home / Self Care | Payer: Medicare HMO | Attending: Emergency Medicine | Admitting: Emergency Medicine

## 2019-01-14 ENCOUNTER — Other Ambulatory Visit: Payer: Self-pay

## 2019-01-14 DIAGNOSIS — J189 Pneumonia, unspecified organism: Secondary | ICD-10-CM | POA: Diagnosis not present

## 2019-01-14 DIAGNOSIS — I2510 Atherosclerotic heart disease of native coronary artery without angina pectoris: Secondary | ICD-10-CM | POA: Insufficient documentation

## 2019-01-14 DIAGNOSIS — Z7982 Long term (current) use of aspirin: Secondary | ICD-10-CM | POA: Insufficient documentation

## 2019-01-14 DIAGNOSIS — Z87891 Personal history of nicotine dependence: Secondary | ICD-10-CM | POA: Insufficient documentation

## 2019-01-14 DIAGNOSIS — Z79899 Other long term (current) drug therapy: Secondary | ICD-10-CM | POA: Insufficient documentation

## 2019-01-14 DIAGNOSIS — R079 Chest pain, unspecified: Secondary | ICD-10-CM | POA: Diagnosis not present

## 2019-01-14 DIAGNOSIS — R0781 Pleurodynia: Secondary | ICD-10-CM | POA: Diagnosis not present

## 2019-01-14 DIAGNOSIS — I10 Essential (primary) hypertension: Secondary | ICD-10-CM | POA: Insufficient documentation

## 2019-01-14 DIAGNOSIS — R0789 Other chest pain: Secondary | ICD-10-CM | POA: Diagnosis not present

## 2019-01-14 DIAGNOSIS — I499 Cardiac arrhythmia, unspecified: Secondary | ICD-10-CM | POA: Diagnosis not present

## 2019-01-14 DIAGNOSIS — R0902 Hypoxemia: Secondary | ICD-10-CM | POA: Diagnosis not present

## 2019-01-14 LAB — CBC WITH DIFFERENTIAL/PLATELET
Abs Immature Granulocytes: 0.02 10*3/uL (ref 0.00–0.07)
Basophils Absolute: 0 10*3/uL (ref 0.0–0.1)
Basophils Relative: 0 %
Eosinophils Absolute: 0.3 10*3/uL (ref 0.0–0.5)
Eosinophils Relative: 4 %
HCT: 39.1 % (ref 39.0–52.0)
Hemoglobin: 12.9 g/dL — ABNORMAL LOW (ref 13.0–17.0)
Immature Granulocytes: 0 %
Lymphocytes Relative: 24 %
Lymphs Abs: 1.6 10*3/uL (ref 0.7–4.0)
MCH: 30.8 pg (ref 26.0–34.0)
MCHC: 33 g/dL (ref 30.0–36.0)
MCV: 93.3 fL (ref 80.0–100.0)
Monocytes Absolute: 0.7 10*3/uL (ref 0.1–1.0)
Monocytes Relative: 10 %
Neutro Abs: 4.2 10*3/uL (ref 1.7–7.7)
Neutrophils Relative %: 62 %
Platelets: 269 10*3/uL (ref 150–400)
RBC: 4.19 MIL/uL — ABNORMAL LOW (ref 4.22–5.81)
RDW: 13.2 % (ref 11.5–15.5)
WBC: 6.8 10*3/uL (ref 4.0–10.5)
nRBC: 0 % (ref 0.0–0.2)

## 2019-01-14 LAB — TROPONIN I (HIGH SENSITIVITY)
Troponin I (High Sensitivity): 6 ng/L (ref <18)
Troponin I (High Sensitivity): 6 ng/L (ref <18)

## 2019-01-14 LAB — BASIC METABOLIC PANEL
Anion gap: 12 (ref 5–15)
BUN: 21 mg/dL (ref 8–23)
CO2: 21 mmol/L — ABNORMAL LOW (ref 22–32)
Calcium: 9.1 mg/dL (ref 8.9–10.3)
Chloride: 106 mmol/L (ref 98–111)
Creatinine, Ser: 1.05 mg/dL (ref 0.61–1.24)
GFR calc Af Amer: >60 mL/min (ref >60)
GFR calc non Af Amer: >60 mL/min (ref >60)
Glucose, Bld: 124 mg/dL — ABNORMAL HIGH (ref 70–99)
Potassium: 3.8 mmol/L (ref 3.5–5.1)
Sodium: 139 mmol/L (ref 135–145)

## 2019-01-14 MED ORDER — ACETAMINOPHEN 500 MG PO TABS
1000.0000 mg | ORAL_TABLET | Freq: Once | ORAL | Status: AC
  Administered 2019-01-14: 1000 mg via ORAL
  Filled 2019-01-14: qty 2

## 2019-01-14 MED ORDER — IOHEXOL 300 MG/ML  SOLN
100.0000 mL | Freq: Once | INTRAMUSCULAR | Status: AC | PRN
  Administered 2019-01-14: 100 mL via INTRAVENOUS

## 2019-01-14 MED ORDER — AZITHROMYCIN 250 MG PO TABS: 250.0000 mg | ORAL_TABLET | Freq: Every day | ORAL | 0 refills | Status: DC

## 2019-01-14 NOTE — ED Provider Notes (Signed)
  Physical Exam  BP (!) 163/101   Pulse (!) 48   Temp 98.4 F (36.9 C)   Resp 14   SpO2 99%   Physical Exam  ED Course/Procedures     Procedures  MDM  Received care from Dr. Stark Jock. Please see his note for prior hx, physical and care. Briefly this is an 85yo male with history of CAD who presents with concern for sharp pleuritic chest pain. Awaiting second troponin.  Troponins not consistent with ACS, cp atypical for CAD.  Given pleuritic sharp pain, ordered CTA PE study for further evaluatoin. CT shows no evidence of PE, shows possible pneumonia.  Overall low clinical suspicion, however given no other clear etiology of pleuritic pain, will treat with abx, recommend PCP follow up and follow up with cardiology.       Gareth Morgan, MD 01/15/19 2251
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
## 2019-01-14 NOTE — ED Triage Notes (Signed)
Pt arrived via EMS from home c/o chest pain starting approx 0200 when it woke him up from a sleep. On arrival by ems pt had already take a personal nitro without improvement. EMS administered 324 ASA and one additional nitro. After that medication pain improved to 5/10. Denies radiation, n/v or lightheadness  At this time in ED pt states pain is 1/10 at rest but when he breathes in deeply the pain increases to 7/10.

## 2019-01-14 NOTE — ED Notes (Signed)
ED Provider at bedside. 

## 2019-01-14 NOTE — ED Notes (Signed)
Daughter Doyle Askew cell [phone]

## 2019-01-25 DIAGNOSIS — M5030 Other cervical disc degeneration, unspecified cervical region: Secondary | ICD-10-CM | POA: Diagnosis not present

## 2019-01-27 ENCOUNTER — Emergency Department (HOSPITAL_COMMUNITY)
Admission: EM | Admit: 2019-01-27 | Discharge: 2019-01-27 | Disposition: A | Discharge status: Home / Self Care | Payer: Medicare HMO | Source: Home / Self Care | Attending: Emergency Medicine | Admitting: Emergency Medicine

## 2019-01-27 ENCOUNTER — Encounter (HOSPITAL_COMMUNITY): Payer: Self-pay | Admitting: Emergency Medicine

## 2019-01-27 ENCOUNTER — Emergency Department (HOSPITAL_COMMUNITY): Payer: Medicare HMO

## 2019-01-27 ENCOUNTER — Other Ambulatory Visit: Payer: Self-pay

## 2019-01-27 ENCOUNTER — Emergency Department (HOSPITAL_COMMUNITY)
Admission: EM | Admit: 2019-01-27 | Discharge: 2019-01-27 | Disposition: A | Discharge status: Home / Self Care | Payer: Medicare HMO | Attending: Emergency Medicine | Admitting: Emergency Medicine

## 2019-01-27 DIAGNOSIS — T83098A Other mechanical complication of other indwelling urethral catheter, initial encounter: Secondary | ICD-10-CM | POA: Diagnosis not present

## 2019-01-27 DIAGNOSIS — R319 Hematuria, unspecified: Secondary | ICD-10-CM

## 2019-01-27 DIAGNOSIS — Z466 Encounter for fitting and adjustment of urinary device: Secondary | ICD-10-CM | POA: Insufficient documentation

## 2019-01-27 DIAGNOSIS — R93429 Abnormal radiologic findings on diagnostic imaging of unspecified kidney: Secondary | ICD-10-CM | POA: Diagnosis not present

## 2019-01-27 DIAGNOSIS — R339 Retention of urine, unspecified: Secondary | ICD-10-CM | POA: Insufficient documentation

## 2019-01-27 DIAGNOSIS — I2510 Atherosclerotic heart disease of native coronary artery without angina pectoris: Secondary | ICD-10-CM | POA: Insufficient documentation

## 2019-01-27 DIAGNOSIS — Z9079 Acquired absence of other genital organ(s): Secondary | ICD-10-CM | POA: Insufficient documentation

## 2019-01-27 DIAGNOSIS — R10815 Periumbilic abdominal tenderness: Secondary | ICD-10-CM | POA: Insufficient documentation

## 2019-01-27 DIAGNOSIS — K5730 Diverticulosis of large intestine without perforation or abscess without bleeding: Secondary | ICD-10-CM | POA: Insufficient documentation

## 2019-01-27 DIAGNOSIS — N289 Disorder of kidney and ureter, unspecified: Secondary | ICD-10-CM | POA: Diagnosis not present

## 2019-01-27 DIAGNOSIS — Z8546 Personal history of malignant neoplasm of prostate: Secondary | ICD-10-CM | POA: Insufficient documentation

## 2019-01-27 DIAGNOSIS — Z923 Personal history of irradiation: Secondary | ICD-10-CM | POA: Diagnosis not present

## 2019-01-27 DIAGNOSIS — I10 Essential (primary) hypertension: Secondary | ICD-10-CM | POA: Insufficient documentation

## 2019-01-27 DIAGNOSIS — Z978 Presence of other specified devices: Secondary | ICD-10-CM

## 2019-01-27 DIAGNOSIS — Z79899 Other long term (current) drug therapy: Secondary | ICD-10-CM | POA: Insufficient documentation

## 2019-01-27 LAB — BASIC METABOLIC PANEL
Anion gap: 10 (ref 5–15)
BUN: 25 mg/dL — ABNORMAL HIGH (ref 8–23)
CO2: 25 mmol/L (ref 22–32)
Calcium: 9.1 mg/dL (ref 8.9–10.3)
Chloride: 102 mmol/L (ref 98–111)
Creatinine, Ser: 1.31 mg/dL — ABNORMAL HIGH (ref 0.61–1.24)
GFR calc Af Amer: 56 mL/min — ABNORMAL LOW (ref >60)
GFR calc non Af Amer: 49 mL/min — ABNORMAL LOW (ref >60)
Glucose, Bld: 104 mg/dL — ABNORMAL HIGH (ref 70–99)
Potassium: 3.9 mmol/L (ref 3.5–5.1)
Sodium: 137 mmol/L (ref 135–145)

## 2019-01-27 LAB — CBC WITH DIFFERENTIAL/PLATELET
Abs Immature Granulocytes: 0.02 10*3/uL (ref 0.00–0.07)
Basophils Absolute: 0 10*3/uL (ref 0.0–0.1)
Basophils Relative: 1 %
Eosinophils Absolute: 0.4 10*3/uL (ref 0.0–0.5)
Eosinophils Relative: 5 %
HCT: 43.9 % (ref 39.0–52.0)
Hemoglobin: 14.3 g/dL (ref 13.0–17.0)
Immature Granulocytes: 0 %
Lymphocytes Relative: 37 %
Lymphs Abs: 2.6 10*3/uL (ref 0.7–4.0)
MCH: 30.6 pg (ref 26.0–34.0)
MCHC: 32.6 g/dL (ref 30.0–36.0)
MCV: 93.8 fL (ref 80.0–100.0)
Monocytes Absolute: 0.8 10*3/uL (ref 0.1–1.0)
Monocytes Relative: 11 %
Neutro Abs: 3.3 10*3/uL (ref 1.7–7.7)
Neutrophils Relative %: 46 %
Platelets: 342 10*3/uL (ref 150–400)
RBC: 4.68 MIL/uL (ref 4.22–5.81)
RDW: 13.1 % (ref 11.5–15.5)
WBC: 7.1 10*3/uL (ref 4.0–10.5)
nRBC: 0 % (ref 0.0–0.2)

## 2019-01-27 LAB — URINALYSIS, ROUTINE W REFLEX MICROSCOPIC
Bilirubin Urine: NEGATIVE
Bilirubin Urine: NEGATIVE
Glucose, UA: NEGATIVE mg/dL
Glucose, UA: NEGATIVE mg/dL
Ketones, ur: NEGATIVE mg/dL
Ketones, ur: NEGATIVE mg/dL
Leukocytes,Ua: NEGATIVE
Leukocytes,Ua: NEGATIVE
Nitrite: NEGATIVE
Nitrite: NEGATIVE
Protein, ur: 30 mg/dL — AB
Protein, ur: 100 mg/dL — AB
RBC / HPF: >50 RBC/hpf — ABNORMAL HIGH (ref 0–5)
Specific Gravity, Urine: 1.004 — ABNORMAL LOW (ref 1.005–1.030)
Specific Gravity, Urine: 1.008 (ref 1.005–1.030)
pH: 8 (ref 5.0–8.0)
pH: 7 (ref 5.0–8.0)

## 2019-01-27 MED ORDER — FENTANYL CITRATE (PF) 100 MCG/2ML IJ SOLN
12.5000 ug | Freq: Once | INTRAMUSCULAR | Status: AC
  Administered 2019-01-27: 12.5 ug via INTRAMUSCULAR

## 2019-01-27 MED ORDER — FENTANYL CITRATE (PF) 100 MCG/2ML IJ SOLN
12.5000 ug | Freq: Once | INTRAMUSCULAR | Status: DC
  Filled 2019-01-27: qty 2

## 2019-01-27 NOTE — ED Triage Notes (Signed)
[name] 3rd visit to ED today for urinary retention.  Foley was placed on last visit but not draining.

## 2019-01-27 NOTE — ED Triage Notes (Signed)
Patient reports urinary retention this evening , last voided yesterday , denies pain , fever or chills .

## 2019-01-27 NOTE — ED Notes (Signed)
Pt catheter flush per MD. 50 mL in and aprox. 50 mL drained. MD notified.

## 2019-01-27 NOTE — Discharge Instructions (Addendum)
Please be sure to schedule follow-up with your urologist as soon as possible.  Return here for concerning changes in your condition.

## 2019-01-27 NOTE — ED Notes (Signed)
Pt voided in his brief and then stood at bedside and voided a small amount into urinaly approximately 18ml.
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
## 2019-01-27 NOTE — ED Notes (Signed)
Pt stood at bedside and used urinal. Output 200, urine is blood tinged with clots

## 2019-01-27 NOTE — ED Notes (Signed)
Patient transported to CT 

## 2019-01-27 NOTE — ED Notes (Signed)
Pt verbalized understanding of d/c instructions and has no further questions, VSS, NAD. Pt to follow up with urology. Sent home with a leg bag for his foley.

## 2019-01-27 NOTE — ED Notes (Signed)
Urology is coming to see the pt, needs the urology cart. OR called by this RN and tech sent to OR to pick up urology cart.

## 2019-01-27 NOTE — ED Notes (Signed)
73ml, in bladder scan after void
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
## 2019-01-27 NOTE — ED Notes (Signed)
Discharge instructions reviewed with pt and family. Pt and family verbalized understanding.  
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
## 2019-01-27 NOTE — ED Notes (Signed)
Bladder scan shows 300 mL. Pt is incontinent at this time. Puts out ~152mL after bladder scan.

## 2019-01-27 NOTE — ED Triage Notes (Signed)
C/o urinary retention that started last night.  States he came to ED early in the morning and was able to urinate but couldn't once he got home.

## 2019-01-27 NOTE — ED Notes (Signed)
This RN in room with [facility] EMT to attempt foley. 2 attempts tried, on the second attempt urine drained by this RN could not get catheter completely inserted into the bladder. Bladder was drained and then catheter removed. Dr Vanita Panda notified and to contact pt's urologist.

## 2019-01-28 DIAGNOSIS — N35014 Post-traumatic urethral stricture, male, unspecified: Secondary | ICD-10-CM | POA: Diagnosis not present

## 2019-01-28 DIAGNOSIS — R338 Other retention of urine: Secondary | ICD-10-CM | POA: Diagnosis not present

## 2019-01-28 DIAGNOSIS — E782 Mixed hyperlipidemia: Secondary | ICD-10-CM | POA: Diagnosis not present

## 2019-01-28 DIAGNOSIS — Z0000 Encounter for general adult medical examination without abnormal findings: Secondary | ICD-10-CM | POA: Diagnosis not present

## 2019-01-28 DIAGNOSIS — M150 Primary generalized (osteo)arthritis: Secondary | ICD-10-CM | POA: Diagnosis not present

## 2019-01-28 DIAGNOSIS — I119 Hypertensive heart disease without heart failure: Secondary | ICD-10-CM | POA: Diagnosis not present

## 2019-01-28 DIAGNOSIS — C61 Malignant neoplasm of prostate: Secondary | ICD-10-CM | POA: Diagnosis not present

## 2019-01-28 DIAGNOSIS — I2510 Atherosclerotic heart disease of native coronary artery without angina pectoris: Secondary | ICD-10-CM | POA: Diagnosis not present

## 2019-01-28 DIAGNOSIS — R69 Illness, unspecified: Secondary | ICD-10-CM | POA: Diagnosis not present

## 2019-01-28 DIAGNOSIS — R339 Retention of urine, unspecified: Secondary | ICD-10-CM | POA: Diagnosis not present

## 2019-01-28 DIAGNOSIS — Z9289 Personal history of other medical treatment: Secondary | ICD-10-CM | POA: Diagnosis not present

## 2019-01-28 LAB — URINE CULTURE: Culture: <10000 — AB

## 2019-02-03 DIAGNOSIS — M542 Cervicalgia: Secondary | ICD-10-CM | POA: Diagnosis not present

## 2019-02-07 DIAGNOSIS — N35014 Post-traumatic urethral stricture, male, unspecified: Secondary | ICD-10-CM | POA: Diagnosis not present

## 2019-02-07 DIAGNOSIS — N528 Other male erectile dysfunction: Secondary | ICD-10-CM | POA: Diagnosis not present

## 2019-02-07 DIAGNOSIS — R9721 Rising PSA following treatment for malignant neoplasm of prostate: Secondary | ICD-10-CM | POA: Diagnosis not present

## 2019-02-08 DIAGNOSIS — H353211 Exudative age-related macular degeneration, right eye, with active choroidal neovascularization: Secondary | ICD-10-CM | POA: Diagnosis not present

## 2019-02-08 DIAGNOSIS — H353122 Nonexudative age-related macular degeneration, left eye, intermediate dry stage: Secondary | ICD-10-CM | POA: Diagnosis not present

## 2019-02-14 DIAGNOSIS — M542 Cervicalgia: Secondary | ICD-10-CM | POA: Diagnosis not present

## 2019-02-15 DIAGNOSIS — Z8546 Personal history of malignant neoplasm of prostate: Secondary | ICD-10-CM | POA: Diagnosis not present

## 2019-02-15 DIAGNOSIS — N289 Disorder of kidney and ureter, unspecified: Secondary | ICD-10-CM | POA: Diagnosis not present

## 2019-02-15 DIAGNOSIS — N281 Cyst of kidney, acquired: Secondary | ICD-10-CM | POA: Diagnosis not present

## 2019-02-23 DIAGNOSIS — M542 Cervicalgia: Secondary | ICD-10-CM | POA: Diagnosis not present

## 2019-02-28 DIAGNOSIS — M542 Cervicalgia: Secondary | ICD-10-CM | POA: Diagnosis not present

## 2019-03-01 DIAGNOSIS — M542 Cervicalgia: Secondary | ICD-10-CM | POA: Diagnosis not present

## 2019-03-08 DIAGNOSIS — H35372 Puckering of macula, left eye: Secondary | ICD-10-CM | POA: Diagnosis not present

## 2019-03-08 DIAGNOSIS — H353211 Exudative age-related macular degeneration, right eye, with active choroidal neovascularization: Secondary | ICD-10-CM | POA: Diagnosis not present

## 2019-03-08 DIAGNOSIS — H353122 Nonexudative age-related macular degeneration, left eye, intermediate dry stage: Secondary | ICD-10-CM | POA: Diagnosis not present

## 2019-03-08 DIAGNOSIS — H35423 Microcystoid degeneration of retina, bilateral: Secondary | ICD-10-CM | POA: Diagnosis not present

## 2019-03-11 DIAGNOSIS — C61 Malignant neoplasm of prostate: Secondary | ICD-10-CM | POA: Diagnosis not present

## 2019-03-11 DIAGNOSIS — N2889 Other specified disorders of kidney and ureter: Secondary | ICD-10-CM | POA: Diagnosis not present

## 2019-03-11 DIAGNOSIS — R310 Gross hematuria: Secondary | ICD-10-CM | POA: Diagnosis not present

## 2019-03-11 DIAGNOSIS — N529 Male erectile dysfunction, unspecified: Secondary | ICD-10-CM | POA: Diagnosis not present

## 2019-03-11 DIAGNOSIS — N35014 Post-traumatic urethral stricture, male, unspecified: Secondary | ICD-10-CM | POA: Diagnosis not present

## 2019-03-11 DIAGNOSIS — R339 Retention of urine, unspecified: Secondary | ICD-10-CM | POA: Diagnosis not present
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
## 2019-03-16 ENCOUNTER — Ambulatory Visit: Payer: Medicare Other | Attending: Family Medicine

## 2019-03-16 DIAGNOSIS — Z23 Encounter for immunization: Secondary | ICD-10-CM

## 2019-03-16 NOTE — Progress Notes (Signed)
   [id number]  Name:  Saivon Hoobler.    MRN: [id number] DOB: 1929/07/04  03/16/2019  Mr. Tesfaye was observed post Covid-19 immunization for 15 minutes without incidence. He was provided with Vaccine Information Sheet and instruction to access the V-Safe system.   Mr. Morelock was instructed to call 911 with any severe reactions post vaccine: Marland Kitchen Difficulty breathing  . Swelling of your face and throat  . A fast heartbeat  . A bad rash all over your body  . Dizziness and weakness    Immunizations Administered    Name Date Dose VIS Date Route   Pfizer COVID-19 Vaccine 03/16/2019 12:03 PM 0.3 mL 02/04/2019 Intramuscular   Manufacturer: [place]   Lot: [id number]   [place]: [id number]

## 2019-03-17 ENCOUNTER — Ambulatory Visit: Payer: Medicare Other | Admitting: Cardiology

## 2019-03-17 ENCOUNTER — Ambulatory Visit: Payer: Medicare HMO

## 2019-03-22 ENCOUNTER — Encounter (HOSPITAL_COMMUNITY): Payer: Self-pay | Admitting: Emergency Medicine

## 2019-03-22 ENCOUNTER — Other Ambulatory Visit: Payer: Self-pay

## 2019-03-22 ENCOUNTER — Emergency Department (HOSPITAL_COMMUNITY)
Admission: EM | Admit: 2019-03-22 | Discharge: 2019-03-22 | Disposition: A | Discharge status: Home / Self Care | Payer: Medicare HMO | Attending: Emergency Medicine | Admitting: Emergency Medicine

## 2019-03-22 ENCOUNTER — Emergency Department (HOSPITAL_COMMUNITY): Payer: Medicare HMO

## 2019-03-22 DIAGNOSIS — I2510 Atherosclerotic heart disease of native coronary artery without angina pectoris: Secondary | ICD-10-CM | POA: Insufficient documentation

## 2019-03-22 DIAGNOSIS — R0789 Other chest pain: Secondary | ICD-10-CM | POA: Diagnosis not present

## 2019-03-22 DIAGNOSIS — Z79899 Other long term (current) drug therapy: Secondary | ICD-10-CM | POA: Insufficient documentation

## 2019-03-22 DIAGNOSIS — I10 Essential (primary) hypertension: Secondary | ICD-10-CM | POA: Insufficient documentation

## 2019-03-22 DIAGNOSIS — R1013 Epigastric pain: Secondary | ICD-10-CM | POA: Diagnosis present

## 2019-03-22 DIAGNOSIS — Z87891 Personal history of nicotine dependence: Secondary | ICD-10-CM | POA: Diagnosis not present

## 2019-03-22 DIAGNOSIS — R079 Chest pain, unspecified: Secondary | ICD-10-CM

## 2019-03-22 DIAGNOSIS — Z7982 Long term (current) use of aspirin: Secondary | ICD-10-CM | POA: Insufficient documentation

## 2019-03-22 LAB — TROPONIN I (HIGH SENSITIVITY)
Troponin I (High Sensitivity): 6 ng/L (ref <18)
Troponin I (High Sensitivity): 8 ng/L (ref <18)

## 2019-03-22 LAB — BASIC METABOLIC PANEL
Anion gap: 10 (ref 5–15)
BUN: 28 mg/dL — ABNORMAL HIGH (ref 8–23)
CO2: 26 mmol/L (ref 22–32)
Calcium: 8.8 mg/dL — ABNORMAL LOW (ref 8.9–10.3)
Chloride: 106 mmol/L (ref 98–111)
Creatinine, Ser: 1.24 mg/dL (ref 0.61–1.24)
GFR calc Af Amer: >60 mL/min (ref >60)
GFR calc non Af Amer: 52 mL/min — ABNORMAL LOW (ref >60)
Glucose, Bld: 127 mg/dL — ABNORMAL HIGH (ref 70–99)
Potassium: 3.6 mmol/L (ref 3.5–5.1)
Sodium: 142 mmol/L (ref 135–145)

## 2019-03-22 LAB — CBC
HCT: 39.2 % (ref 39.0–52.0)
Hemoglobin: 12.7 g/dL — ABNORMAL LOW (ref 13.0–17.0)
MCH: 30 pg (ref 26.0–34.0)
MCHC: 32.4 g/dL (ref 30.0–36.0)
MCV: 92.7 fL (ref 80.0–100.0)
Platelets: 289 10*3/uL (ref 150–400)
RBC: 4.23 MIL/uL (ref 4.22–5.81)
RDW: 13.1 % (ref 11.5–15.5)
WBC: 9.1 10*3/uL (ref 4.0–10.5)
nRBC: 0 % (ref 0.0–0.2)

## 2019-03-22 MED ORDER — LACTATED RINGERS IV BOLUS
1000.0000 mL | Freq: Once | INTRAVENOUS | Status: AC
  Administered 2019-03-22: 1000 mL via INTRAVENOUS

## 2019-03-22 MED ORDER — FENTANYL CITRATE (PF) 100 MCG/2ML IJ SOLN
50.0000 ug | Freq: Once | INTRAMUSCULAR | Status: AC
  Administered 2019-03-22: 50 ug via INTRAVENOUS
  Filled 2019-03-22: qty 2

## 2019-03-22 MED ORDER — ALUM & MAG HYDROXIDE-SIMETH 200-200-20 MG/5ML PO SUSP
30.0000 mL | Freq: Once | ORAL | Status: AC
  Administered 2019-03-22: 06:00:00 30 mL via ORAL
  Filled 2019-03-22: qty 30

## 2019-03-22 MED ORDER — LIDOCAINE VISCOUS HCL 2 % MT SOLN
15.0000 mL | Freq: Once | OROMUCOSAL | Status: AC
  Administered 2019-03-22: 15 mL via ORAL
  Filled 2019-03-22: qty 15

## 2019-03-22 MED ORDER — SODIUM CHLORIDE 0.9% FLUSH
3.0000 mL | Freq: Once | INTRAVENOUS | Status: AC
  Administered 2019-03-22: 3 mL via INTRAVENOUS

## 2019-03-22 NOTE — ED Triage Notes (Signed)
Pt presents to ED from home BIB [facility]. Pt c/o CP beginning @ 0130, pain 5/10, pain is central and epigastric. EMS gave 324 ASA, 0.4 nitro, 4mg  zofran. bp initially 188/90 down to 108/67 after nitro. Nitro did not change pain. EMS EKG LBBB.
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
## 2019-03-22 NOTE — ED Notes (Signed)
Pt ambulating well independently to bathroom

## 2019-03-22 NOTE — ED Notes (Signed)
Discharge instructions discussed with pt. Pt verbalized understanding. Pt stable and ambulatory. No signature pad available. 

## 2019-03-31 ENCOUNTER — Encounter: Payer: Self-pay | Admitting: Neurology

## 2019-03-31 ENCOUNTER — Ambulatory Visit: Payer: Medicare HMO | Admitting: Neurology
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
## 2019-04-04 ENCOUNTER — Other Ambulatory Visit: Payer: Self-pay

## 2019-04-04 ENCOUNTER — Ambulatory Visit (INDEPENDENT_AMBULATORY_CARE_PROVIDER_SITE_OTHER): Payer: Medicare HMO | Admitting: Neurology

## 2019-04-04 ENCOUNTER — Encounter: Payer: Self-pay | Admitting: Neurology

## 2019-04-04 VITALS — BP 168/102 | HR 77 | Temp 97.6°F | Ht 66.0 in | Wt 122.0 lb

## 2019-04-04 DIAGNOSIS — G3184 Mild cognitive impairment, so stated: Secondary | ICD-10-CM

## 2019-04-04 MED ORDER — MEMANTINE HCL 10 MG PO TABS: 10.0000 mg | ORAL_TABLET | Freq: Two times a day (BID) | ORAL | 3 refills | Status: DC

## 2019-04-04 NOTE — Patient Instructions (Addendum)
Continue taking Namenda as prescribed, your BP was elevated today, please go home and take your BP medications. Continue follow-up with PCP. See you in year!
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
## 2019-04-06 ENCOUNTER — Ambulatory Visit: Payer: Medicare HMO | Attending: Internal Medicine

## 2019-04-06 DIAGNOSIS — Z23 Encounter for immunization: Secondary | ICD-10-CM | POA: Insufficient documentation

## 2019-04-06 NOTE — Progress Notes (Signed)
   [id number]  Name:  Andre Casey.    MRN: [id number] DOB: [birth year]  04/06/2019  [name] was observed post Covid-19 immunization for 15 minutes without incidence. He was provided with Vaccine Information Sheet and instruction to access the V-Safe system.   [name] was instructed to call 911 with any severe reactions post vaccine: Marland Kitchen Difficulty breathing  . Swelling of your face and throat  . A fast heartbeat  . A bad rash all over your body  . Dizziness and weakness    Immunizations Administered    Name Date Dose VIS Date Route   Pfizer COVID-19 Vaccine 04/06/2019  5:36 PM 0.3 mL 02/04/2019 Intramuscular   Manufacturer: [place]   Lot: [id number]   [place]: [id number]

## 2019-04-27 NOTE — Progress Notes (Signed)
I have reviewed and agreed above plan. 

## 2019-05-09 ENCOUNTER — Encounter: Payer: Self-pay | Admitting: Cardiology

## 2019-06-06 ENCOUNTER — Telehealth: Payer: Self-pay

## 2019-06-06 NOTE — Telephone Encounter (Signed)
  Patient Consent for Virtual Visit         Andre Casey. has provided verbal consent on 06/06/2019 for a virtual visit (video or telephone).   CONSENT FOR VIRTUAL VISIT FOR:  Andre Casey.  By participating in this virtual visit I agree to the following:  I hereby voluntarily request, consent and authorize [facility] and its employed or contracted physicians, physician assistants, nurse practitioners or other licensed health care professionals (the Practitioner), to provide me with telemedicine health care services (the "Services") as deemed necessary by the treating Practitioner. I acknowledge and consent to receive the Services by the Practitioner via telemedicine. I understand that the telemedicine visit will involve communicating with the Practitioner through live audiovisual communication technology and the disclosure of certain medical information by electronic transmission. I acknowledge that I have been given the opportunity to request an in-person assessment or other available alternative prior to the telemedicine visit and am voluntarily participating in the telemedicine visit.  I understand that I have the right to withhold or withdraw my consent to the use of telemedicine in the course of my care at any time, without affecting my right to future care or treatment, and that the Practitioner or I may terminate the telemedicine visit at any time. I understand that I have the right to inspect all information obtained and/or recorded in the course of the telemedicine visit and may receive copies of available information for a reasonable fee.  I understand that some of the potential risks of receiving the Services via telemedicine include:  Marland Kitchen Delay or interruption in medical evaluation due to technological equipment failure or disruption; . Information transmitted may not be sufficient (e.g. poor resolution of images) to allow for appropriate medical decision making by  the Practitioner; and/or  . In rare instances, security protocols could fail, causing a breach of personal health information.  Furthermore, I acknowledge that it is my responsibility to provide information about my medical history, conditions and care that is complete and accurate to the best of my ability. I acknowledge that Practitioner's advice, recommendations, and/or decision may be based on factors not within their control, such as incomplete or inaccurate data provided by me or distortions of diagnostic images or specimens that may result from electronic transmissions. I understand that the practice of medicine is not an exact science and that Practitioner makes no warranties or guarantees regarding treatment outcomes. I acknowledge that a copy of this consent can be made available to me via my patient portal ([facility]), or I can request a printed copy by calling the office of [facility].    I understand that my insurance will be billed for this visit.   I have read or had this consent read to me. . I understand the contents of this consent, which adequately explains the benefits and risks of the Services being provided via telemedicine.  . I have been provided ample opportunity to ask questions regarding this consent and the Services and have had my questions answered to my satisfaction. . I give my informed consent for the services to be provided through the use of telemedicine in my medical care

## 2019-06-07 ENCOUNTER — Telehealth (INDEPENDENT_AMBULATORY_CARE_PROVIDER_SITE_OTHER): Payer: Medicare HMO | Admitting: Cardiology

## 2019-06-07 ENCOUNTER — Encounter: Payer: Self-pay | Admitting: Cardiology

## 2019-06-07 VITALS — Ht 67.0 in | Wt 123.0 lb

## 2019-06-07 DIAGNOSIS — I10 Essential (primary) hypertension: Secondary | ICD-10-CM | POA: Diagnosis not present

## 2019-06-07 DIAGNOSIS — Z8546 Personal history of malignant neoplasm of prostate: Secondary | ICD-10-CM

## 2019-06-07 DIAGNOSIS — I2510 Atherosclerotic heart disease of native coronary artery without angina pectoris: Secondary | ICD-10-CM | POA: Diagnosis not present

## 2019-06-07 DIAGNOSIS — I447 Left bundle-branch block, unspecified: Secondary | ICD-10-CM

## 2019-06-07 DIAGNOSIS — Z9861 Coronary angioplasty status: Secondary | ICD-10-CM

## 2019-06-07 DIAGNOSIS — E785 Hyperlipidemia, unspecified: Secondary | ICD-10-CM | POA: Diagnosis not present
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
## 2019-06-07 NOTE — Patient Instructions (Signed)
Medication Instructions:  Continue same medications *If you need a refill on your cardiac medications before your next appointment, please call your pharmacy*   Lab Work: None ordered    Testing/Procedures: None ordered   Follow-Up: At [facility], you and your health needs are our priority.  As part of our continuing mission to provide you with exceptional heart care, we have created designated Provider Care Teams.  These Care Teams include your primary Cardiologist (physician) and Advanced Practice Providers (APPs -  Physician Assistants and Nurse Practitioners) who all work together to provide you with the care you need, when you need it.  We recommend signing up for the patient portal called "MyChart".  Sign up information is provided on this After Visit Summary.  MyChart is used to connect with patients for Virtual Visits (Telemedicine).  Patients are able to view lab/test results, encounter notes, upcoming appointments, etc.  Non-urgent messages can be sent to your provider as well.   To learn more about what you can do with MyChart, go to [email or web address].    Your next appointment:  1 year   Call in Feb to schedule a April appointment   The format for your next appointment: Office    Provider:  Dr.Crenshaw

## 2019-12-04 DIAGNOSIS — N2889 Other specified disorders of kidney and ureter: Secondary | ICD-10-CM | POA: Insufficient documentation

## 2020-02-27 DIAGNOSIS — Z791 Long term (current) use of non-steroidal anti-inflammatories (NSAID): Secondary | ICD-10-CM | POA: Diagnosis not present

## 2020-02-27 DIAGNOSIS — K219 Gastro-esophageal reflux disease without esophagitis: Secondary | ICD-10-CM | POA: Diagnosis not present

## 2020-02-27 DIAGNOSIS — R32 Unspecified urinary incontinence: Secondary | ICD-10-CM | POA: Diagnosis not present

## 2020-02-27 DIAGNOSIS — I10 Essential (primary) hypertension: Secondary | ICD-10-CM | POA: Diagnosis not present

## 2020-02-27 DIAGNOSIS — G8929 Other chronic pain: Secondary | ICD-10-CM | POA: Diagnosis not present

## 2020-02-27 DIAGNOSIS — R69 Illness, unspecified: Secondary | ICD-10-CM | POA: Diagnosis not present

## 2020-02-27 DIAGNOSIS — Z8546 Personal history of malignant neoplasm of prostate: Secondary | ICD-10-CM | POA: Diagnosis not present

## 2020-02-27 DIAGNOSIS — E669 Obesity, unspecified: Secondary | ICD-10-CM | POA: Diagnosis not present

## 2020-02-27 DIAGNOSIS — F0390 Unspecified dementia without behavioral disturbance: Secondary | ICD-10-CM | POA: Diagnosis not present

## 2020-02-27 DIAGNOSIS — Z7982 Long term (current) use of aspirin: Secondary | ICD-10-CM | POA: Diagnosis not present

## 2020-03-01 DIAGNOSIS — H353231 Exudative age-related macular degeneration, bilateral, with active choroidal neovascularization: Secondary | ICD-10-CM | POA: Diagnosis not present

## 2020-03-14 DIAGNOSIS — M5030 Other cervical disc degeneration, unspecified cervical region: Secondary | ICD-10-CM | POA: Diagnosis not present

## 2020-03-21 ENCOUNTER — Ambulatory Visit: Payer: Medicare HMO | Admitting: Dermatology

## 2020-03-21 DIAGNOSIS — I6529 Occlusion and stenosis of unspecified carotid artery: Secondary | ICD-10-CM | POA: Diagnosis not present

## 2020-03-21 DIAGNOSIS — M47812 Spondylosis without myelopathy or radiculopathy, cervical region: Secondary | ICD-10-CM | POA: Diagnosis not present

## 2020-03-22 DIAGNOSIS — E46 Unspecified protein-calorie malnutrition: Secondary | ICD-10-CM | POA: Diagnosis not present

## 2020-03-22 DIAGNOSIS — R69 Illness, unspecified: Secondary | ICD-10-CM | POA: Diagnosis not present

## 2020-03-22 DIAGNOSIS — I2510 Atherosclerotic heart disease of native coronary artery without angina pectoris: Secondary | ICD-10-CM | POA: Diagnosis not present

## 2020-03-22 DIAGNOSIS — Z0000 Encounter for general adult medical examination without abnormal findings: Secondary | ICD-10-CM | POA: Diagnosis not present

## 2020-03-22 DIAGNOSIS — R197 Diarrhea, unspecified: Secondary | ICD-10-CM | POA: Diagnosis not present

## 2020-03-22 DIAGNOSIS — G4700 Insomnia, unspecified: Secondary | ICD-10-CM | POA: Diagnosis not present

## 2020-03-22 DIAGNOSIS — R32 Unspecified urinary incontinence: Secondary | ICD-10-CM | POA: Diagnosis not present

## 2020-03-22 DIAGNOSIS — E782 Mixed hyperlipidemia: Secondary | ICD-10-CM | POA: Diagnosis not present

## 2020-03-22 DIAGNOSIS — I119 Hypertensive heart disease without heart failure: Secondary | ICD-10-CM | POA: Diagnosis not present

## 2020-03-22 DIAGNOSIS — Z23 Encounter for immunization: Secondary | ICD-10-CM | POA: Diagnosis not present

## 2020-03-22 DIAGNOSIS — C61 Malignant neoplasm of prostate: Secondary | ICD-10-CM | POA: Diagnosis not present

## 2020-03-26 DIAGNOSIS — H26492 Other secondary cataract, left eye: Secondary | ICD-10-CM | POA: Diagnosis not present

## 2020-03-26 DIAGNOSIS — I10 Essential (primary) hypertension: Secondary | ICD-10-CM | POA: Diagnosis not present

## 2020-03-26 DIAGNOSIS — Z961 Presence of intraocular lens: Secondary | ICD-10-CM | POA: Diagnosis not present

## 2020-03-26 DIAGNOSIS — H26491 Other secondary cataract, right eye: Secondary | ICD-10-CM | POA: Diagnosis not present

## 2020-03-28 DIAGNOSIS — R197 Diarrhea, unspecified: Secondary | ICD-10-CM | POA: Diagnosis not present

## 2020-04-03 ENCOUNTER — Encounter: Payer: Self-pay | Admitting: Neurology

## 2020-04-03 ENCOUNTER — Other Ambulatory Visit (HOSPITAL_COMMUNITY): Payer: Self-pay | Admitting: Chiropractic Medicine

## 2020-04-03 ENCOUNTER — Ambulatory Visit (HOSPITAL_COMMUNITY)
Admission: RE | Admit: 2020-04-03 | Discharge: 2020-04-03 | Disposition: A | Discharge status: Home / Self Care | Payer: Medicare HMO | Source: Ambulatory Visit | Attending: Cardiovascular Disease | Admitting: Cardiovascular Disease

## 2020-04-03 ENCOUNTER — Other Ambulatory Visit: Payer: Self-pay

## 2020-04-03 ENCOUNTER — Ambulatory Visit (INDEPENDENT_AMBULATORY_CARE_PROVIDER_SITE_OTHER): Payer: Medicare HMO | Admitting: Neurology

## 2020-04-03 VITALS — BP 151/84 | HR 71 | Ht 67.0 in | Wt 129.8 lb

## 2020-04-03 DIAGNOSIS — I6529 Occlusion and stenosis of unspecified carotid artery: Secondary | ICD-10-CM | POA: Diagnosis not present

## 2020-04-03 DIAGNOSIS — M47812 Spondylosis without myelopathy or radiculopathy, cervical region: Secondary | ICD-10-CM

## 2020-04-03 DIAGNOSIS — G3184 Mild cognitive impairment, so stated: Secondary | ICD-10-CM | POA: Diagnosis not present

## 2020-04-03 MED ORDER — MEMANTINE HCL 10 MG PO TABS: 10.0000 mg | ORAL_TABLET | Freq: Two times a day (BID) | ORAL | 3 refills | Status: DC
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
## 2020-04-05 DIAGNOSIS — H5212 Myopia, left eye: Secondary | ICD-10-CM | POA: Diagnosis not present

## 2020-04-05 DIAGNOSIS — H524 Presbyopia: Secondary | ICD-10-CM | POA: Diagnosis not present

## 2020-04-05 DIAGNOSIS — Z961 Presence of intraocular lens: Secondary | ICD-10-CM | POA: Diagnosis not present

## 2020-04-05 DIAGNOSIS — H5201 Hypermetropia, right eye: Secondary | ICD-10-CM | POA: Diagnosis not present

## 2020-04-05 DIAGNOSIS — Z9842 Cataract extraction status, left eye: Secondary | ICD-10-CM | POA: Diagnosis not present

## 2020-04-05 DIAGNOSIS — H26491 Other secondary cataract, right eye: Secondary | ICD-10-CM | POA: Diagnosis not present

## 2020-04-05 DIAGNOSIS — H52221 Regular astigmatism, right eye: Secondary | ICD-10-CM | POA: Diagnosis not present

## 2020-04-13 DIAGNOSIS — M79644 Pain in right finger(s): Secondary | ICD-10-CM | POA: Diagnosis not present

## 2020-04-16 DIAGNOSIS — Z961 Presence of intraocular lens: Secondary | ICD-10-CM | POA: Diagnosis not present

## 2020-04-16 DIAGNOSIS — H26491 Other secondary cataract, right eye: Secondary | ICD-10-CM | POA: Diagnosis not present

## 2020-04-20 ENCOUNTER — Other Ambulatory Visit: Payer: Self-pay

## 2020-04-20 ENCOUNTER — Encounter: Payer: Self-pay | Admitting: Podiatry

## 2020-04-20 ENCOUNTER — Ambulatory Visit (INDEPENDENT_AMBULATORY_CARE_PROVIDER_SITE_OTHER): Payer: Medicare HMO | Admitting: Podiatry

## 2020-04-20 DIAGNOSIS — M79675 Pain in left toe(s): Secondary | ICD-10-CM | POA: Diagnosis not present

## 2020-04-20 DIAGNOSIS — M79674 Pain in right toe(s): Secondary | ICD-10-CM | POA: Diagnosis not present

## 2020-04-20 DIAGNOSIS — B351 Tinea unguium: Secondary | ICD-10-CM | POA: Diagnosis not present
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
## 2020-04-23 DIAGNOSIS — Z9841 Cataract extraction status, right eye: Secondary | ICD-10-CM | POA: Diagnosis not present

## 2020-04-23 DIAGNOSIS — H5989 Other postprocedural complications and disorders of eye and adnexa, not elsewhere classified: Secondary | ICD-10-CM | POA: Diagnosis not present

## 2020-04-23 DIAGNOSIS — Z9842 Cataract extraction status, left eye: Secondary | ICD-10-CM | POA: Diagnosis not present

## 2020-04-23 DIAGNOSIS — H5212 Myopia, left eye: Secondary | ICD-10-CM | POA: Diagnosis not present

## 2020-04-23 DIAGNOSIS — H52221 Regular astigmatism, right eye: Secondary | ICD-10-CM | POA: Diagnosis not present

## 2020-04-23 DIAGNOSIS — Z961 Presence of intraocular lens: Secondary | ICD-10-CM | POA: Diagnosis not present

## 2020-04-23 DIAGNOSIS — H5201 Hypermetropia, right eye: Secondary | ICD-10-CM | POA: Diagnosis not present

## 2020-04-23 DIAGNOSIS — H524 Presbyopia: Secondary | ICD-10-CM | POA: Diagnosis not present

## 2020-04-26 DIAGNOSIS — H353231 Exudative age-related macular degeneration, bilateral, with active choroidal neovascularization: Secondary | ICD-10-CM | POA: Diagnosis not present

## 2020-04-26 DIAGNOSIS — H35372 Puckering of macula, left eye: Secondary | ICD-10-CM | POA: Diagnosis not present

## 2020-04-27 ENCOUNTER — Encounter (HOSPITAL_COMMUNITY): Payer: Self-pay

## 2020-04-27 ENCOUNTER — Emergency Department (HOSPITAL_COMMUNITY)
Admission: EM | Admit: 2020-04-27 | Discharge: 2020-04-27 | Disposition: A | Discharge status: Home / Self Care | Payer: Medicare HMO | Attending: Emergency Medicine | Admitting: Emergency Medicine

## 2020-04-27 ENCOUNTER — Other Ambulatory Visit: Payer: Self-pay

## 2020-04-27 DIAGNOSIS — Z8546 Personal history of malignant neoplasm of prostate: Secondary | ICD-10-CM | POA: Diagnosis not present

## 2020-04-27 DIAGNOSIS — I10 Essential (primary) hypertension: Secondary | ICD-10-CM | POA: Insufficient documentation

## 2020-04-27 DIAGNOSIS — Z951 Presence of aortocoronary bypass graft: Secondary | ICD-10-CM | POA: Insufficient documentation

## 2020-04-27 DIAGNOSIS — R1030 Lower abdominal pain, unspecified: Secondary | ICD-10-CM | POA: Insufficient documentation

## 2020-04-27 DIAGNOSIS — Z7982 Long term (current) use of aspirin: Secondary | ICD-10-CM | POA: Insufficient documentation

## 2020-04-27 DIAGNOSIS — Z79899 Other long term (current) drug therapy: Secondary | ICD-10-CM | POA: Diagnosis not present

## 2020-04-27 DIAGNOSIS — I2510 Atherosclerotic heart disease of native coronary artery without angina pectoris: Secondary | ICD-10-CM | POA: Diagnosis not present

## 2020-04-27 DIAGNOSIS — R000 Tachycardia, unspecified: Secondary | ICD-10-CM | POA: Diagnosis not present

## 2020-04-27 DIAGNOSIS — R310 Gross hematuria: Secondary | ICD-10-CM | POA: Diagnosis not present

## 2020-04-27 DIAGNOSIS — M79644 Pain in right finger(s): Secondary | ICD-10-CM | POA: Diagnosis not present

## 2020-04-27 DIAGNOSIS — R339 Retention of urine, unspecified: Secondary | ICD-10-CM | POA: Diagnosis not present

## 2020-04-27 DIAGNOSIS — R338 Other retention of urine: Secondary | ICD-10-CM

## 2020-04-27 DIAGNOSIS — N3289 Other specified disorders of bladder: Secondary | ICD-10-CM | POA: Diagnosis not present

## 2020-04-27 DIAGNOSIS — Z87891 Personal history of nicotine dependence: Secondary | ICD-10-CM | POA: Diagnosis not present

## 2020-04-27 DIAGNOSIS — Z85828 Personal history of other malignant neoplasm of skin: Secondary | ICD-10-CM | POA: Insufficient documentation

## 2020-04-27 LAB — I-STAT CHEM 8, ED
BUN: 27 mg/dL — ABNORMAL HIGH (ref 8–23)
Calcium, Ion: 1.12 mmol/L — ABNORMAL LOW (ref 1.15–1.40)
Chloride: 105 mmol/L (ref 98–111)
Creatinine, Ser: 1.2 mg/dL (ref 0.61–1.24)
Glucose, Bld: 115 mg/dL — ABNORMAL HIGH (ref 70–99)
HCT: 40 % (ref 39.0–52.0)
Hemoglobin: 13.6 g/dL (ref 13.0–17.0)
Potassium: 4.1 mmol/L (ref 3.5–5.1)
Sodium: 139 mmol/L (ref 135–145)
TCO2: 23 mmol/L (ref 22–32)

## 2020-04-27 LAB — CBC WITH DIFFERENTIAL/PLATELET
Abs Immature Granulocytes: 0.05 10*3/uL (ref 0.00–0.07)
Basophils Absolute: 0.1 10*3/uL (ref 0.0–0.1)
Basophils Relative: 1 %
Eosinophils Absolute: 0.3 10*3/uL (ref 0.0–0.5)
Eosinophils Relative: 3 %
HCT: 41.3 % (ref 39.0–52.0)
Hemoglobin: 13.3 g/dL (ref 13.0–17.0)
Immature Granulocytes: 1 %
Lymphocytes Relative: 19 %
Lymphs Abs: 2 10*3/uL (ref 0.7–4.0)
MCH: 29.6 pg (ref 26.0–34.0)
MCHC: 32.2 g/dL (ref 30.0–36.0)
MCV: 91.8 fL (ref 80.0–100.0)
Monocytes Absolute: 1 10*3/uL (ref 0.1–1.0)
Monocytes Relative: 9 %
Neutro Abs: 7.2 10*3/uL (ref 1.7–7.7)
Neutrophils Relative %: 67 %
Platelets: 322 10*3/uL (ref 150–400)
RBC: 4.5 MIL/uL (ref 4.22–5.81)
RDW: 13.5 % (ref 11.5–15.5)
WBC: 10.6 10*3/uL — ABNORMAL HIGH (ref 4.0–10.5)
nRBC: 0 % (ref 0.0–0.2)

## 2020-04-27 LAB — URINALYSIS, ROUTINE W REFLEX MICROSCOPIC: RBC / HPF: >50 RBC/hpf — ABNORMAL HIGH (ref 0–5)

## 2020-04-27 MED ORDER — MORPHINE SULFATE (PF) 4 MG/ML IV SOLN
4.0000 mg | Freq: Once | INTRAVENOUS | Status: AC
  Administered 2020-04-27: 4 mg via INTRAVENOUS
  Filled 2020-04-27: qty 1

## 2020-04-27 MED ORDER — SODIUM CHLORIDE 0.9 % IR SOLN: 3000.0000 mL | Status: DC
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
## 2020-04-27 NOTE — ED Notes (Signed)
Patient requesting something for pain. PA notified.

## 2020-04-27 NOTE — ED Notes (Signed)
Charge nurse attempted to insert 3 way foley with resistant. PA aware

## 2020-04-27 NOTE — ED Notes (Signed)
Patient states needs to have BM, patient ambulatory to restroom with son.

## 2020-04-27 NOTE — ED Triage Notes (Signed)
Pt presents with c/o urinary retention since last night and hematuria that he noticed this morning.

## 2020-04-27 NOTE — ED Notes (Signed)
Blood clot noted from penis. Patient cleaned and chuck pad placed
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
## 2020-04-27 NOTE — Discharge Instructions (Signed)
Please follow up with your urologist or with [facility] next week for reassessment of your condition and removal of your foley catheter.  Return if you have any concerns.

## 2020-04-27 NOTE — ED Notes (Signed)
MD in to see patient 

## 2020-04-27 NOTE — ED Notes (Signed)
Urology at bedside.

## 2020-04-27 NOTE — ED Notes (Signed)
Blood at tip of penis will insert 3 way foley per PA

## 2020-04-27 NOTE — ED Notes (Signed)
Bladder scan performed with 141ml, tenderness with palpitation. Per PA insert foley

## 2020-04-27 NOTE — ED Notes (Signed)
Leg bag placed.

## 2020-04-27 NOTE — ED Notes (Signed)
Per urology place leg bag and will not need CBI due to no clots in bladder at this time.

## 2020-04-29 ENCOUNTER — Emergency Department (HOSPITAL_COMMUNITY)
Admission: EM | Admit: 2020-04-29 | Discharge: 2020-04-29 | Disposition: A | Discharge status: Home / Self Care | Payer: Medicare HMO | Source: Home / Self Care | Attending: Emergency Medicine | Admitting: Emergency Medicine

## 2020-04-29 ENCOUNTER — Other Ambulatory Visit: Payer: Self-pay

## 2020-04-29 ENCOUNTER — Encounter (HOSPITAL_COMMUNITY): Payer: Self-pay | Admitting: Emergency Medicine

## 2020-04-29 DIAGNOSIS — Y828 Other medical devices associated with adverse incidents: Secondary | ICD-10-CM | POA: Insufficient documentation

## 2020-04-29 DIAGNOSIS — Z79899 Other long term (current) drug therapy: Secondary | ICD-10-CM | POA: Insufficient documentation

## 2020-04-29 DIAGNOSIS — R339 Retention of urine, unspecified: Secondary | ICD-10-CM | POA: Insufficient documentation

## 2020-04-29 DIAGNOSIS — R109 Unspecified abdominal pain: Secondary | ICD-10-CM | POA: Diagnosis not present

## 2020-04-29 DIAGNOSIS — Z951 Presence of aortocoronary bypass graft: Secondary | ICD-10-CM | POA: Insufficient documentation

## 2020-04-29 DIAGNOSIS — I10 Essential (primary) hypertension: Secondary | ICD-10-CM | POA: Insufficient documentation

## 2020-04-29 DIAGNOSIS — R310 Gross hematuria: Secondary | ICD-10-CM | POA: Diagnosis not present

## 2020-04-29 DIAGNOSIS — Z87891 Personal history of nicotine dependence: Secondary | ICD-10-CM | POA: Insufficient documentation

## 2020-04-29 DIAGNOSIS — Z85828 Personal history of other malignant neoplasm of skin: Secondary | ICD-10-CM | POA: Insufficient documentation

## 2020-04-29 DIAGNOSIS — Z8546 Personal history of malignant neoplasm of prostate: Secondary | ICD-10-CM | POA: Insufficient documentation

## 2020-04-29 DIAGNOSIS — K625 Hemorrhage of anus and rectum: Secondary | ICD-10-CM | POA: Diagnosis not present

## 2020-04-29 DIAGNOSIS — Z7982 Long term (current) use of aspirin: Secondary | ICD-10-CM | POA: Insufficient documentation

## 2020-04-29 DIAGNOSIS — I2510 Atherosclerotic heart disease of native coronary artery without angina pectoris: Secondary | ICD-10-CM | POA: Insufficient documentation

## 2020-04-29 DIAGNOSIS — T83091A Other mechanical complication of indwelling urethral catheter, initial encounter: Secondary | ICD-10-CM | POA: Insufficient documentation

## 2020-04-29 DIAGNOSIS — T839XXA Unspecified complication of genitourinary prosthetic device, implant and graft, initial encounter: Secondary | ICD-10-CM

## 2020-04-29 DIAGNOSIS — T83098A Other mechanical complication of other indwelling urethral catheter, initial encounter: Secondary | ICD-10-CM | POA: Diagnosis not present

## 2020-04-29 DIAGNOSIS — R509 Fever, unspecified: Secondary | ICD-10-CM | POA: Diagnosis not present

## 2020-04-29 DIAGNOSIS — N3001 Acute cystitis with hematuria: Secondary | ICD-10-CM | POA: Diagnosis not present

## 2020-04-29 MED ORDER — LIDOCAINE HCL URETHRAL/MUCOSAL 2 % EX GEL: 1.0000 "application " | Freq: Once | CUTANEOUS | Status: DC | PRN

## 2020-04-29 MED ORDER — TRAMADOL HCL 50 MG PO TABS: 50.0000 mg | ORAL_TABLET | Freq: Four times a day (QID) | ORAL | 0 refills | Status: DC | PRN

## 2020-04-29 NOTE — Discharge Instructions (Signed)
While having the blood in the urine flush the catheter with saline every 12 hours to prevent obstruction.
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
## 2020-04-29 NOTE — ED Triage Notes (Signed)
Patient seen yesterday for retention and hematuria. Patient states output has been only blood, and that last night he noticed it was no longer draining. Patient complaining of bladder spasms and pain

## 2020-04-29 NOTE — Discharge Instructions (Addendum)
Take tramadol as prescribed as needed for pain.  Follow-up with your primary doctor/urologist in the next week.

## 2020-04-29 NOTE — ED Triage Notes (Signed)
Pt reports that his foley catheter has been blocked since about 7p and bloody urine is coming out of his penis around the catheter.

## 2020-04-29 NOTE — ED Notes (Signed)
Bladder scan volume: 66 mL.
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
## 2020-04-30 ENCOUNTER — Emergency Department (HOSPITAL_COMMUNITY): Payer: Medicare HMO

## 2020-04-30 ENCOUNTER — Other Ambulatory Visit: Payer: Self-pay

## 2020-04-30 ENCOUNTER — Inpatient Hospital Stay (HOSPITAL_COMMUNITY)
Admission: EM | Admit: 2020-04-30 | Discharge: 2020-05-03 | DRG: 690 | Disposition: A | Discharge status: Home Health | Payer: Medicare HMO | Attending: Internal Medicine | Admitting: Internal Medicine

## 2020-04-30 ENCOUNTER — Encounter (HOSPITAL_COMMUNITY): Payer: Self-pay

## 2020-04-30 DIAGNOSIS — N3001 Acute cystitis with hematuria: Secondary | ICD-10-CM | POA: Diagnosis not present

## 2020-04-30 DIAGNOSIS — G3184 Mild cognitive impairment, so stated: Secondary | ICD-10-CM | POA: Diagnosis present

## 2020-04-30 DIAGNOSIS — K625 Hemorrhage of anus and rectum: Secondary | ICD-10-CM | POA: Diagnosis present

## 2020-04-30 DIAGNOSIS — I471 Supraventricular tachycardia: Secondary | ICD-10-CM | POA: Diagnosis present

## 2020-04-30 DIAGNOSIS — R319 Hematuria, unspecified: Secondary | ICD-10-CM | POA: Diagnosis not present

## 2020-04-30 DIAGNOSIS — Z85828 Personal history of other malignant neoplasm of skin: Secondary | ICD-10-CM

## 2020-04-30 DIAGNOSIS — I160 Hypertensive urgency: Secondary | ICD-10-CM | POA: Diagnosis present

## 2020-04-30 DIAGNOSIS — R636 Underweight: Secondary | ICD-10-CM | POA: Diagnosis present

## 2020-04-30 DIAGNOSIS — T83091A Other mechanical complication of indwelling urethral catheter, initial encounter: Secondary | ICD-10-CM | POA: Diagnosis present

## 2020-04-30 DIAGNOSIS — K5900 Constipation, unspecified: Secondary | ICD-10-CM | POA: Diagnosis present

## 2020-04-30 DIAGNOSIS — Z7982 Long term (current) use of aspirin: Secondary | ICD-10-CM

## 2020-04-30 DIAGNOSIS — R339 Retention of urine, unspecified: Secondary | ICD-10-CM | POA: Diagnosis present

## 2020-04-30 DIAGNOSIS — F0390 Unspecified dementia without behavioral disturbance: Secondary | ICD-10-CM | POA: Diagnosis present

## 2020-04-30 DIAGNOSIS — Z888 Allergy status to other drugs, medicaments and biological substances status: Secondary | ICD-10-CM

## 2020-04-30 DIAGNOSIS — Z955 Presence of coronary angioplasty implant and graft: Secondary | ICD-10-CM

## 2020-04-30 DIAGNOSIS — Z8546 Personal history of malignant neoplasm of prostate: Secondary | ICD-10-CM

## 2020-04-30 DIAGNOSIS — Z823 Family history of stroke: Secondary | ICD-10-CM

## 2020-04-30 DIAGNOSIS — Z923 Personal history of irradiation: Secondary | ICD-10-CM

## 2020-04-30 DIAGNOSIS — N2889 Other specified disorders of kidney and ureter: Secondary | ICD-10-CM | POA: Diagnosis not present

## 2020-04-30 DIAGNOSIS — M1990 Unspecified osteoarthritis, unspecified site: Secondary | ICD-10-CM | POA: Diagnosis present

## 2020-04-30 DIAGNOSIS — I2510 Atherosclerotic heart disease of native coronary artery without angina pectoris: Secondary | ICD-10-CM | POA: Diagnosis present

## 2020-04-30 DIAGNOSIS — K649 Unspecified hemorrhoids: Secondary | ICD-10-CM | POA: Diagnosis present

## 2020-04-30 DIAGNOSIS — I10 Essential (primary) hypertension: Secondary | ICD-10-CM | POA: Diagnosis not present

## 2020-04-30 DIAGNOSIS — N35919 Unspecified urethral stricture, male, unspecified site: Secondary | ICD-10-CM | POA: Diagnosis present

## 2020-04-30 DIAGNOSIS — Z8249 Family history of ischemic heart disease and other diseases of the circulatory system: Secondary | ICD-10-CM

## 2020-04-30 DIAGNOSIS — R509 Fever, unspecified: Secondary | ICD-10-CM | POA: Diagnosis not present

## 2020-04-30 DIAGNOSIS — Z87891 Personal history of nicotine dependence: Secondary | ICD-10-CM

## 2020-04-30 DIAGNOSIS — C61 Malignant neoplasm of prostate: Secondary | ICD-10-CM

## 2020-04-30 DIAGNOSIS — E785 Hyperlipidemia, unspecified: Secondary | ICD-10-CM | POA: Diagnosis not present

## 2020-04-30 DIAGNOSIS — E860 Dehydration: Secondary | ICD-10-CM | POA: Diagnosis present

## 2020-04-30 DIAGNOSIS — Y738 Miscellaneous gastroenterology and urology devices associated with adverse incidents, not elsewhere classified: Secondary | ICD-10-CM | POA: Diagnosis present

## 2020-04-30 DIAGNOSIS — Z20822 Contact with and (suspected) exposure to covid-19: Secondary | ICD-10-CM | POA: Diagnosis present

## 2020-04-30 DIAGNOSIS — T83031A Leakage of indwelling urethral catheter, initial encounter: Secondary | ICD-10-CM | POA: Diagnosis present

## 2020-04-30 DIAGNOSIS — Z79899 Other long term (current) drug therapy: Secondary | ICD-10-CM

## 2020-04-30 DIAGNOSIS — R109 Unspecified abdominal pain: Secondary | ICD-10-CM | POA: Diagnosis not present

## 2020-04-30 DIAGNOSIS — N390 Urinary tract infection, site not specified: Secondary | ICD-10-CM | POA: Diagnosis not present

## 2020-04-30 DIAGNOSIS — R7881 Bacteremia: Secondary | ICD-10-CM | POA: Diagnosis present

## 2020-04-30 DIAGNOSIS — E876 Hypokalemia: Secondary | ICD-10-CM | POA: Diagnosis present

## 2020-04-30 DIAGNOSIS — H9190 Unspecified hearing loss, unspecified ear: Secondary | ICD-10-CM | POA: Diagnosis present

## 2020-04-30 DIAGNOSIS — R32 Unspecified urinary incontinence: Secondary | ICD-10-CM | POA: Diagnosis present

## 2020-04-30 DIAGNOSIS — Z66 Do not resuscitate: Secondary | ICD-10-CM | POA: Diagnosis present

## 2020-04-30 DIAGNOSIS — Z825 Family history of asthma and other chronic lower respiratory diseases: Secondary | ICD-10-CM

## 2020-04-30 DIAGNOSIS — R338 Other retention of urine: Secondary | ICD-10-CM | POA: Diagnosis present

## 2020-04-30 DIAGNOSIS — B9620 Unspecified Escherichia coli [E. coli] as the cause of diseases classified elsewhere: Secondary | ICD-10-CM | POA: Diagnosis present

## 2020-04-30 DIAGNOSIS — E782 Mixed hyperlipidemia: Secondary | ICD-10-CM | POA: Diagnosis present

## 2020-04-30 DIAGNOSIS — D62 Acute posthemorrhagic anemia: Secondary | ICD-10-CM | POA: Diagnosis present

## 2020-04-30 DIAGNOSIS — I447 Left bundle-branch block, unspecified: Secondary | ICD-10-CM | POA: Diagnosis present

## 2020-04-30 DIAGNOSIS — N320 Bladder-neck obstruction: Secondary | ICD-10-CM | POA: Diagnosis present

## 2020-04-30 DIAGNOSIS — Z681 Body mass index (BMI) 19 or less, adult: Secondary | ICD-10-CM

## 2020-04-30 LAB — RETICULOCYTES
Immature Retic Fract: 20.4 % — ABNORMAL HIGH (ref 2.3–15.9)
RBC.: 3.64 MIL/uL — ABNORMAL LOW (ref 4.22–5.81)
Retic Count, Absolute: 66.6 10*3/uL (ref 19.0–186.0)
Retic Ct Pct: 1.8 % (ref 0.4–3.1)

## 2020-04-30 LAB — BASIC METABOLIC PANEL
Anion gap: 13 (ref 5–15)
BUN: 23 mg/dL (ref 8–23)
CO2: 22 mmol/L (ref 22–32)
Calcium: 8.7 mg/dL — ABNORMAL LOW (ref 8.9–10.3)
Chloride: 97 mmol/L — ABNORMAL LOW (ref 98–111)
Creatinine, Ser: 1.04 mg/dL (ref 0.61–1.24)
GFR, Estimated: >60 mL/min (ref >60)
Glucose, Bld: 97 mg/dL (ref 70–99)
Potassium: 3.3 mmol/L — ABNORMAL LOW (ref 3.5–5.1)
Sodium: 132 mmol/L — ABNORMAL LOW (ref 135–145)

## 2020-04-30 LAB — CBC WITH DIFFERENTIAL/PLATELET
Abs Immature Granulocytes: 0.04 10*3/uL (ref 0.00–0.07)
Basophils Absolute: 0 10*3/uL (ref 0.0–0.1)
Basophils Relative: 0 %
Eosinophils Absolute: 0.1 10*3/uL (ref 0.0–0.5)
Eosinophils Relative: 1 %
HCT: 34 % — ABNORMAL LOW (ref 39.0–52.0)
Hemoglobin: 10.9 g/dL — ABNORMAL LOW (ref 13.0–17.0)
Immature Granulocytes: 0 %
Lymphocytes Relative: 11 %
Lymphs Abs: 1.1 10*3/uL (ref 0.7–4.0)
MCH: 29.5 pg (ref 26.0–34.0)
MCHC: 32.1 g/dL (ref 30.0–36.0)
MCV: 92.1 fL (ref 80.0–100.0)
Monocytes Absolute: 1.1 10*3/uL — ABNORMAL HIGH (ref 0.1–1.0)
Monocytes Relative: 11 %
Neutro Abs: 7.7 10*3/uL (ref 1.7–7.7)
Neutrophils Relative %: 77 %
Platelets: 242 10*3/uL (ref 150–400)
RBC: 3.69 MIL/uL — ABNORMAL LOW (ref 4.22–5.81)
RDW: 13.7 % (ref 11.5–15.5)
WBC: 10.1 10*3/uL (ref 4.0–10.5)
nRBC: 0 % (ref 0.0–0.2)

## 2020-04-30 LAB — TYPE AND SCREEN
ABO/RH(D): A POS
Antibody Screen: NEGATIVE

## 2020-04-30 LAB — URINALYSIS, ROUTINE W REFLEX MICROSCOPIC
Bilirubin Urine: NEGATIVE
Glucose, UA: NEGATIVE mg/dL
Ketones, ur: 5 mg/dL — AB
Nitrite: NEGATIVE
Protein, ur: NEGATIVE mg/dL
Specific Gravity, Urine: 1.012 (ref 1.005–1.030)
WBC, UA: >50 WBC/hpf — ABNORMAL HIGH (ref 0–5)
pH: 5 (ref 5.0–8.0)

## 2020-04-30 LAB — CREATININE, URINE, RANDOM: Creatinine, Urine: 55.32 mg/dL

## 2020-04-30 LAB — PROCALCITONIN: Procalcitonin: <0.1 ng/mL

## 2020-04-30 LAB — RESP PANEL BY RT-PCR (FLU A&B, COVID) ARPGX2
Influenza A by PCR: NEGATIVE
Influenza B by PCR: NEGATIVE
SARS Coronavirus 2 by RT PCR: NEGATIVE

## 2020-04-30 LAB — LACTIC ACID, PLASMA: Lactic Acid, Venous: 0.9 mmol/L (ref 0.5–1.9)

## 2020-04-30 LAB — SODIUM, URINE, RANDOM: Sodium, Ur: 145 mmol/L

## 2020-04-30 LAB — MAGNESIUM: Magnesium: 2.3 mg/dL (ref 1.7–2.4)

## 2020-04-30 LAB — IRON AND TIBC
Iron: 20 ug/dL — ABNORMAL LOW (ref 45–182)
Saturation Ratios: 7 % — ABNORMAL LOW (ref 17.9–39.5)
TIBC: 293 ug/dL (ref 250–450)
UIBC: 273 ug/dL

## 2020-04-30 LAB — ABO/RH: ABO/RH(D): A POS

## 2020-04-30 LAB — POC OCCULT BLOOD, ED: Fecal Occult Bld: POSITIVE — AB

## 2020-04-30 LAB — VITAMIN B12: Vitamin B-12: 323 pg/mL (ref 180–914)

## 2020-04-30 LAB — FOLATE: Folate: 31.3 ng/mL (ref >5.9)

## 2020-04-30 LAB — CK: Total CK: 188 U/L (ref 49–397)

## 2020-04-30 LAB — FERRITIN: Ferritin: 122 ng/mL (ref 24–336)

## 2020-04-30 LAB — OSMOLALITY, URINE: Osmolality, Ur: 534 mOsm/kg (ref 300–900)

## 2020-04-30 MED ORDER — ACETAMINOPHEN 650 MG RE SUPP: 650.0000 mg | Freq: Four times a day (QID) | RECTAL | Status: DC | PRN

## 2020-04-30 MED ORDER — SODIUM CHLORIDE 0.9 % IV SOLN
75.0000 mL/h | INTRAVENOUS | Status: AC
  Administered 2020-04-30: 75 mL/h via INTRAVENOUS

## 2020-04-30 MED ORDER — HYDROCODONE-ACETAMINOPHEN 5-325 MG PO TABS
1.0000 | ORAL_TABLET | ORAL | Status: DC | PRN
  Administered 2020-05-01 – 2020-05-02 (×4): 1 via ORAL
  Administered 2020-05-03: 2 via ORAL
  Filled 2020-04-30: qty 2
  Filled 2020-04-30 (×4): qty 1

## 2020-04-30 MED ORDER — POLYETHYLENE GLYCOL 3350 17 G PO PACK: 17.0000 g | PACK | Freq: Every day | ORAL | Status: DC | PRN

## 2020-04-30 MED ORDER — BISACODYL 10 MG RE SUPP: 10.0000 mg | Freq: Every day | RECTAL | Status: DC | PRN

## 2020-04-30 MED ORDER — IOHEXOL 300 MG/ML  SOLN
100.0000 mL | Freq: Once | INTRAMUSCULAR | Status: AC | PRN
  Administered 2020-04-30: 100 mL via INTRAVENOUS

## 2020-04-30 MED ORDER — FLEET ENEMA 7-19 GM/118ML RE ENEM: 1.0000 | ENEMA | Freq: Once | RECTAL | Status: DC | PRN

## 2020-04-30 MED ORDER — POTASSIUM CHLORIDE CRYS ER 20 MEQ PO TBCR
40.0000 meq | EXTENDED_RELEASE_TABLET | Freq: Once | ORAL | Status: AC
  Administered 2020-04-30: 40 meq via ORAL
  Filled 2020-04-30: qty 2

## 2020-04-30 MED ORDER — MEMANTINE HCL 10 MG PO TABS
10.0000 mg | ORAL_TABLET | Freq: Two times a day (BID) | ORAL | Status: DC
  Administered 2020-04-30 – 2020-05-03 (×6): 10 mg via ORAL
  Filled 2020-04-30 (×6): qty 1

## 2020-04-30 MED ORDER — ACETAMINOPHEN 325 MG PO TABS
650.0000 mg | ORAL_TABLET | Freq: Once | ORAL | Status: AC
  Administered 2020-04-30: 650 mg via ORAL
  Filled 2020-04-30: qty 2

## 2020-04-30 MED ORDER — SENNA 8.6 MG PO TABS
1.0000 | ORAL_TABLET | Freq: Two times a day (BID) | ORAL | Status: DC
  Administered 2020-04-30 – 2020-05-03 (×6): 8.6 mg via ORAL
  Filled 2020-04-30 (×6): qty 1

## 2020-04-30 MED ORDER — SODIUM CHLORIDE 0.9 % IV SOLN
1.0000 g | Freq: Once | INTRAVENOUS | Status: AC
  Administered 2020-04-30: 1 g via INTRAVENOUS
  Filled 2020-04-30: qty 10

## 2020-04-30 MED ORDER — DOCUSATE SODIUM 100 MG PO CAPS
100.0000 mg | ORAL_CAPSULE | Freq: Two times a day (BID) | ORAL | Status: DC
  Administered 2020-04-30 – 2020-05-03 (×6): 100 mg via ORAL
  Filled 2020-04-30 (×6): qty 1

## 2020-04-30 MED ORDER — ACETAMINOPHEN 325 MG PO TABS
650.0000 mg | ORAL_TABLET | Freq: Four times a day (QID) | ORAL | Status: DC | PRN
  Administered 2020-05-01 – 2020-05-02 (×3): 650 mg via ORAL
  Filled 2020-04-30 (×3): qty 2

## 2020-04-30 MED ORDER — SODIUM CHLORIDE 0.9 % IV SOLN: 1.0000 g | INTRAVENOUS | Status: DC

## 2020-04-30 MED ORDER — ATORVASTATIN CALCIUM 40 MG PO TABS
40.0000 mg | ORAL_TABLET | Freq: Every day | ORAL | Status: DC
  Administered 2020-05-01 – 2020-05-03 (×3): 40 mg via ORAL
  Filled 2020-04-30 (×3): qty 1

## 2020-04-30 MED ORDER — TRAMADOL HCL 50 MG PO TABS
50.0000 mg | ORAL_TABLET | Freq: Four times a day (QID) | ORAL | Status: DC | PRN
  Administered 2020-05-01 – 2020-05-02 (×2): 50 mg via ORAL
  Filled 2020-04-30 (×2): qty 1
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
## 2020-04-30 NOTE — ED Notes (Signed)
Attempted chat report and call report to 5W. No answer at this time.

## 2020-04-30 NOTE — ED Notes (Signed)
Report called to Kinde, RN on 5W

## 2020-04-30 NOTE — ED Triage Notes (Signed)
Pt presents with c/o catheter blockage causing urinary retention and rectal bleeding as well.

## 2020-04-30 NOTE — ED Notes (Signed)
Irrigated urinary catheter. Instilled 114mL NS, drained 219mL. Multiple blood clots noted. Tolerated well.
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
## 2020-05-01 DIAGNOSIS — E785 Hyperlipidemia, unspecified: Secondary | ICD-10-CM | POA: Diagnosis not present

## 2020-05-01 DIAGNOSIS — B9620 Unspecified Escherichia coli [E. coli] as the cause of diseases classified elsewhere: Secondary | ICD-10-CM | POA: Diagnosis not present

## 2020-05-01 DIAGNOSIS — D62 Acute posthemorrhagic anemia: Secondary | ICD-10-CM | POA: Diagnosis not present

## 2020-05-01 DIAGNOSIS — Z466 Encounter for fitting and adjustment of urinary device: Secondary | ICD-10-CM | POA: Diagnosis not present

## 2020-05-01 DIAGNOSIS — H9190 Unspecified hearing loss, unspecified ear: Secondary | ICD-10-CM | POA: Diagnosis present

## 2020-05-01 DIAGNOSIS — K649 Unspecified hemorrhoids: Secondary | ICD-10-CM | POA: Diagnosis present

## 2020-05-01 DIAGNOSIS — I447 Left bundle-branch block, unspecified: Secondary | ICD-10-CM | POA: Diagnosis present

## 2020-05-01 DIAGNOSIS — R9431 Abnormal electrocardiogram [ECG] [EKG]: Secondary | ICD-10-CM | POA: Diagnosis not present

## 2020-05-01 DIAGNOSIS — M1990 Unspecified osteoarthritis, unspecified site: Secondary | ICD-10-CM | POA: Diagnosis present

## 2020-05-01 DIAGNOSIS — F0390 Unspecified dementia without behavioral disturbance: Secondary | ICD-10-CM | POA: Diagnosis present

## 2020-05-01 DIAGNOSIS — I10 Essential (primary) hypertension: Secondary | ICD-10-CM | POA: Diagnosis not present

## 2020-05-01 DIAGNOSIS — Z681 Body mass index (BMI) 19 or less, adult: Secondary | ICD-10-CM | POA: Diagnosis not present

## 2020-05-01 DIAGNOSIS — R310 Gross hematuria: Secondary | ICD-10-CM | POA: Diagnosis not present

## 2020-05-01 DIAGNOSIS — N390 Urinary tract infection, site not specified: Secondary | ICD-10-CM | POA: Diagnosis not present

## 2020-05-01 DIAGNOSIS — R339 Retention of urine, unspecified: Secondary | ICD-10-CM | POA: Diagnosis not present

## 2020-05-01 DIAGNOSIS — N320 Bladder-neck obstruction: Secondary | ICD-10-CM | POA: Diagnosis present

## 2020-05-01 DIAGNOSIS — C61 Malignant neoplasm of prostate: Secondary | ICD-10-CM | POA: Diagnosis not present

## 2020-05-01 DIAGNOSIS — K625 Hemorrhage of anus and rectum: Secondary | ICD-10-CM | POA: Diagnosis not present

## 2020-05-01 DIAGNOSIS — Z85828 Personal history of other malignant neoplasm of skin: Secondary | ICD-10-CM | POA: Diagnosis not present

## 2020-05-01 DIAGNOSIS — I471 Supraventricular tachycardia: Secondary | ICD-10-CM | POA: Diagnosis not present

## 2020-05-01 DIAGNOSIS — Z923 Personal history of irradiation: Secondary | ICD-10-CM | POA: Diagnosis not present

## 2020-05-01 DIAGNOSIS — R338 Other retention of urine: Secondary | ICD-10-CM | POA: Diagnosis not present

## 2020-05-01 DIAGNOSIS — Z8546 Personal history of malignant neoplasm of prostate: Secondary | ICD-10-CM | POA: Diagnosis not present

## 2020-05-01 DIAGNOSIS — R7881 Bacteremia: Secondary | ICD-10-CM

## 2020-05-01 DIAGNOSIS — Z20822 Contact with and (suspected) exposure to covid-19: Secondary | ICD-10-CM | POA: Diagnosis not present

## 2020-05-01 DIAGNOSIS — N2889 Other specified disorders of kidney and ureter: Secondary | ICD-10-CM | POA: Diagnosis not present

## 2020-05-01 DIAGNOSIS — Z66 Do not resuscitate: Secondary | ICD-10-CM | POA: Diagnosis not present

## 2020-05-01 DIAGNOSIS — R636 Underweight: Secondary | ICD-10-CM | POA: Diagnosis present

## 2020-05-01 DIAGNOSIS — N35919 Unspecified urethral stricture, male, unspecified site: Secondary | ICD-10-CM | POA: Diagnosis not present

## 2020-05-01 DIAGNOSIS — I2510 Atherosclerotic heart disease of native coronary artery without angina pectoris: Secondary | ICD-10-CM | POA: Diagnosis present

## 2020-05-01 DIAGNOSIS — Z888 Allergy status to other drugs, medicaments and biological substances status: Secondary | ICD-10-CM | POA: Diagnosis not present

## 2020-05-01 DIAGNOSIS — Y738 Miscellaneous gastroenterology and urology devices associated with adverse incidents, not elsewhere classified: Secondary | ICD-10-CM | POA: Diagnosis not present

## 2020-05-01 DIAGNOSIS — N3001 Acute cystitis with hematuria: Secondary | ICD-10-CM | POA: Diagnosis not present

## 2020-05-01 DIAGNOSIS — G3184 Mild cognitive impairment, so stated: Secondary | ICD-10-CM | POA: Diagnosis not present

## 2020-05-01 LAB — CBC WITH DIFFERENTIAL/PLATELET
Abs Immature Granulocytes: 0.03 10*3/uL (ref 0.00–0.07)
Basophils Absolute: 0 10*3/uL (ref 0.0–0.1)
Basophils Relative: 0 %
Eosinophils Absolute: 0 10*3/uL (ref 0.0–0.5)
Eosinophils Relative: 1 %
HCT: 33.6 % — ABNORMAL LOW (ref 39.0–52.0)
Hemoglobin: 10.7 g/dL — ABNORMAL LOW (ref 13.0–17.0)
Immature Granulocytes: 0 %
Lymphocytes Relative: 10 %
Lymphs Abs: 0.8 10*3/uL (ref 0.7–4.0)
MCH: 29.6 pg (ref 26.0–34.0)
MCHC: 31.8 g/dL (ref 30.0–36.0)
MCV: 92.8 fL (ref 80.0–100.0)
Monocytes Absolute: 1 10*3/uL (ref 0.1–1.0)
Monocytes Relative: 13 %
Neutro Abs: 6.1 10*3/uL (ref 1.7–7.7)
Neutrophils Relative %: 76 %
Platelets: 222 10*3/uL (ref 150–400)
RBC: 3.62 MIL/uL — ABNORMAL LOW (ref 4.22–5.81)
RDW: 13.7 % (ref 11.5–15.5)
WBC: 8 10*3/uL (ref 4.0–10.5)
nRBC: 0 % (ref 0.0–0.2)

## 2020-05-01 LAB — BLOOD CULTURE ID PANEL (REFLEXED) - BCID2

## 2020-05-01 LAB — COMPREHENSIVE METABOLIC PANEL
ALT: 16 U/L (ref 0–44)
AST: 18 U/L (ref 15–41)
Albumin: 3.1 g/dL — ABNORMAL LOW (ref 3.5–5.0)
Alkaline Phosphatase: 52 U/L (ref 38–126)
Anion gap: 9 (ref 5–15)
BUN: 21 mg/dL (ref 8–23)
CO2: 21 mmol/L — ABNORMAL LOW (ref 22–32)
Calcium: 8.1 mg/dL — ABNORMAL LOW (ref 8.9–10.3)
Chloride: 101 mmol/L (ref 98–111)
Creatinine, Ser: 1.12 mg/dL (ref 0.61–1.24)
GFR, Estimated: >60 mL/min (ref >60)
Glucose, Bld: 116 mg/dL — ABNORMAL HIGH (ref 70–99)
Potassium: 3.6 mmol/L (ref 3.5–5.1)
Sodium: 131 mmol/L — ABNORMAL LOW (ref 135–145)
Total Bilirubin: 0.9 mg/dL (ref 0.3–1.2)
Total Protein: 5.8 g/dL — ABNORMAL LOW (ref 6.5–8.1)

## 2020-05-01 LAB — PHOSPHORUS: Phosphorus: 2.5 mg/dL (ref 2.5–4.6)

## 2020-05-01 LAB — MAGNESIUM: Magnesium: 2.1 mg/dL (ref 1.7–2.4)

## 2020-05-01 LAB — TSH: TSH: 1.556 u[IU]/mL (ref 0.350–4.500)

## 2020-05-01 MED ORDER — AMLODIPINE BESYLATE 5 MG PO TABS
5.0000 mg | ORAL_TABLET | Freq: Every day | ORAL | Status: DC
Start: 2020-05-01 — End: 2020-05-03
  Administered 2020-05-01 – 2020-05-03 (×3): 5 mg via ORAL
  Filled 2020-05-01 (×3): qty 1

## 2020-05-01 MED ORDER — CARVEDILOL 6.25 MG PO TABS
6.2500 mg | ORAL_TABLET | Freq: Two times a day (BID) | ORAL | Status: DC
  Administered 2020-05-01 – 2020-05-03 (×4): 6.25 mg via ORAL
  Filled 2020-05-01 (×4): qty 1

## 2020-05-01 MED ORDER — CHLORHEXIDINE GLUCONATE CLOTH 2 % EX PADS
6.0000 | MEDICATED_PAD | Freq: Every day | CUTANEOUS | Status: DC
  Administered 2020-05-01 – 2020-05-03 (×3): 6 via TOPICAL

## 2020-05-01 MED ORDER — SODIUM CHLORIDE 0.9 % IV SOLN
2.0000 g | INTRAVENOUS | Status: AC
  Administered 2020-05-01 – 2020-05-03 (×3): 2 g via INTRAVENOUS
  Filled 2020-05-01: qty 20
  Filled 2020-05-01: qty 2
  Filled 2020-05-01: qty 20
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
## 2020-05-01 NOTE — Progress Notes (Signed)
EKG completed. Results showed sinus brady and left bundle branch block. EKG results placed in patient's chart.
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
## 2020-05-01 NOTE — Progress Notes (Signed)
PHARMACY - PHYSICIAN COMMUNICATION CRITICAL VALUE ALERT - BLOOD CULTURE IDENTIFICATION (BCID)  Andre Casey. is an 87 y.o. male who presented to [facility] on 04/30/2020 with a chief complaint of  Chief Complaint  Patient presents with  . Rectal Bleeding  . Urinary Retention     Assessment:  Urosepsis   Name of physician (or Provider) Contacted: Dr. Lonny Prude   Current antibiotics: ceftriaxone 1 gr IV q24h   Changes to prescribed antibiotics recommended:  - Will increase dose to ceftriaxone 2 gr IV q24h   Results for orders placed or performed during the hospital encounter of 04/30/20  Blood Culture ID Panel (Reflexed) (Collected: 04/30/2020  3:53 PM)  Result Value Ref Range   Enterococcus faecalis NOT DETECTED NOT DETECTED   Enterococcus Faecium NOT DETECTED NOT DETECTED   Listeria monocytogenes NOT DETECTED NOT DETECTED   Staphylococcus species NOT DETECTED NOT DETECTED   Staphylococcus aureus (BCID) NOT DETECTED NOT DETECTED   Staphylococcus epidermidis NOT DETECTED NOT DETECTED   Staphylococcus lugdunensis NOT DETECTED NOT DETECTED   Streptococcus species NOT DETECTED NOT DETECTED   Streptococcus agalactiae NOT DETECTED NOT DETECTED   Streptococcus pneumoniae NOT DETECTED NOT DETECTED   Streptococcus pyogenes NOT DETECTED NOT DETECTED   A.calcoaceticus-baumannii NOT DETECTED NOT DETECTED   Bacteroides fragilis NOT DETECTED NOT DETECTED   Enterobacterales DETECTED (A) NOT DETECTED   Enterobacter cloacae complex NOT DETECTED NOT DETECTED   Escherichia coli DETECTED (A) NOT DETECTED   Klebsiella aerogenes NOT DETECTED NOT DETECTED   Klebsiella oxytoca NOT DETECTED NOT DETECTED   Klebsiella pneumoniae NOT DETECTED NOT DETECTED   Proteus species NOT DETECTED NOT DETECTED   Salmonella species NOT DETECTED NOT DETECTED   Serratia marcescens NOT DETECTED NOT DETECTED   Haemophilus influenzae NOT DETECTED NOT DETECTED   Neisseria meningitidis NOT DETECTED NOT DETECTED    Pseudomonas aeruginosa NOT DETECTED NOT DETECTED   Stenotrophomonas maltophilia NOT DETECTED NOT DETECTED   Candida albicans NOT DETECTED NOT DETECTED   Candida auris NOT DETECTED NOT DETECTED   Candida glabrata NOT DETECTED NOT DETECTED   Candida krusei NOT DETECTED NOT DETECTED   Candida parapsilosis NOT DETECTED NOT DETECTED   Candida tropicalis NOT DETECTED NOT DETECTED   Cryptococcus neoformans/gattii NOT DETECTED NOT DETECTED   CTX-M ESBL NOT DETECTED NOT DETECTED   Carbapenem resistance IMP NOT DETECTED NOT DETECTED   Carbapenem resistance KPC NOT DETECTED NOT DETECTED   Carbapenem resistance NDM NOT DETECTED NOT DETECTED   Carbapenem resist OXA 48 LIKE NOT DETECTED NOT DETECTED   Carbapenem resistance VIM NOT DETECTED NOT DETECTED      Royetta Asal, PharmD, BCPS 05/01/2020 8:00 AM

## 2020-05-01 NOTE — Progress Notes (Signed)
PROGRESS NOTE    Andre Casey.  KZS:[id number] DOB: 1929-04-19 DOA: 04/30/2020 PCP: [name], MD   Brief Narrative: Andre Casey. is a 87 y.o. male with a history of hypertension, dementia, hyperlipidemia, CAD, prostate cancer s/p surgery and radiation. Patient presented secondary to urinary retention and hematuria with concern for UTI and found to have E. Coli bacteremia.   Assessment & Plan:   Active Problems:   Mild cognitive impairment   Dyslipidemia, goal LDL below 70   Essential hypertension   History of prostate cancer   Malignant neoplasm of prostate (HCC)   Renal mass, right   Urinary retention   Acute lower UTI   Hematuria   Urinary retention Secondary to known urethral stricture.  Neurology consult on admission with recommendations to keep Foley in and follow-up with outpatient neurology as an outpatient. -Continue Foley catheter  Gross hematuria In setting of prostate cancer with radiation therapy history.  Recurrent issue.  Also complicated by likely UTI.  Hemoglobin stable.  E. Coli bacteremia Blood culture significant for E. coli on BCID.  Patient was on ceftriaxone 1 g IV daily for urinary tract infection. -Ceftriaxone 2 g IV daily -Follow-up blood culture sensitivities  Acute UTI In setting of neurological pathology.  Associated with above E. coli bacteremia.  Urine cultures obtained on admission and still pending. -Follow-up urine culture -Antibiotics as mentioned above  Malignant neoplasm of prostate Patient follows with urology at [facility].  Primary hypertension Patient is on amlodipine, carvedilol, lisinopril-hydrochlorothiazide as an outpatient.  Blood pressure is moderately uncontrolled while inpatient. -Restart home amlodipine and Coreg -Hold lisinopril-hydrochlorothiazide for now cannot complicate renal function  Hyperlipidemia Patient is on Lipitor 40 mg daily as an outpatient -Continue Lipitor 40 mg  daily  Mild cognitive impairment Patient is on Namenda as an outpatient -Continue Namenda 10 mg twice daily  Acute blood loss anemia In setting of gross hematuria.  Patient also found to have fecal occult positive blood. Anemia most likely related to hematuria. -Daily CBC  Blood per rectum Patient with history of hemorrhoids.  Blood was noticed on toilet paper rather than the toilet bowl.  Fecal occult blood test was performed via digital rectal examination and was positive for occult blood. No mention of gross blood. Discussed with family and unsure of last colonoscopy but sure he has had in the past.   DVT prophylaxis: SCDs Code Status:   Code Status: DNR Family Communication: Son at bedside Disposition Plan: Discharge likely home in 24 to 48 hours pending results of urine and blood cultures and transition to oral antibiotics   Consultants:   Urology  Procedures:   [place] (3/7>>  Antimicrobials:  Ceftriaxone IV    Subjective: Some penile pain. No other concerns.  Objective: Vitals:   04/30/20 2100 05/01/20 0100 05/01/20 0500 05/01/20 0831  BP: (!) 154/66 137/73 112/67 (!) 179/87  Pulse: 62 86 67 73  Resp: 19 19 20 16   Temp: 99.8 F (37.7 C) 100.2 F (37.9 C) 98.7 F (37.1 C) 98.5 F (36.9 C)  TempSrc: Oral Oral Oral Oral  SpO2: 98% 95% 100% 99%  Weight: 56.5 kg       Intake/Output Summary (Last 24 hours) at 05/01/2020 1142 Last data filed at 05/01/2020 0600 Gross per 24 hour  Intake 580.45 ml  Output 1200 ml  Net -619.55 ml   Filed Weights   04/30/20 2100  Weight: 56.5 kg    Examination:  General exam: Appears calm  and comfortable Respiratory system: Clear to auscultation. Respiratory effort normal. Cardiovascular system: S1 & S2 heard, RRR. No murmurs, rubs, gallops or clicks. Gastrointestinal system: Abdomen is nondistended, soft and nontender. No organomegaly or masses felt. Normal bowel sounds heard. Central nervous system:  Alert. No focal neurological deficits. Musculoskeletal: trace edema. No calf tenderness Skin: No cyanosis. No rashes Psychiatry: Judgement and insight appear normal. Mood & affect appropriate.     Data Reviewed: I have personally reviewed following labs and imaging studies  CBC Lab Results  Component Value Date   WBC 8.0 05/01/2020   RBC 3.62 (L) 05/01/2020   HGB 10.7 (L) 05/01/2020   HCT 33.6 (L) 05/01/2020   MCV 92.8 05/01/2020   MCH 29.6 05/01/2020   PLT 222 05/01/2020   MCHC 31.8 05/01/2020   RDW 13.7 05/01/2020   LYMPHSABS 0.8 05/01/2020   MONOABS 1.0 05/01/2020   EOSABS 0.0 05/01/2020   BASOSABS 0.0 10/07/4816     Last metabolic panel Lab Results  Component Value Date   NA 131 (L) 05/01/2020   K 3.6 05/01/2020   CL 101 05/01/2020   CO2 21 (L) 05/01/2020   BUN 21 05/01/2020   CREATININE 1.12 05/01/2020   GLUCOSE 116 (H) 05/01/2020   GFRNONAA >60 05/01/2020   GFRAA >60 03/22/2019   CALCIUM 8.1 (L) 05/01/2020   PHOS 2.5 05/01/2020   PROT 5.8 (L) 05/01/2020   ALBUMIN 3.1 (L) 05/01/2020   LABGLOB 2.3 04/14/2017   AGRATIO 2.0 04/14/2017   BILITOT 0.9 05/01/2020   ALKPHOS 52 05/01/2020   AST 18 05/01/2020   ALT 16 05/01/2020   ANIONGAP 9 05/01/2020    CBG (last 3)  No results for input(s): GLUCAP in the last 72 hours.   GFR: Estimated Creatinine Clearance: 36.4 mL/min (by C-G formula based on SCr of 1.12 mg/dL).  Coagulation Profile: No results for input(s): INR, PROTIME in the last 168 hours.  Recent Results (from the past 240 hour(s))  Blood culture (routine x 2)     Status: None (Preliminary result)   Collection Time: 04/30/20  3:53 PM   Specimen: BLOOD  Result Value Ref Range Status   Specimen Description   Final    BLOOD RIGHT ANTECUBITAL Performed at [facility] [address], [place], [place] [address]    Special Requests   Final    BOTTLES DRAWN AEROBIC AND ANAEROBIC Blood Culture adequate volume Performed at [facility] [address], [place], [place] [address]    Culture  Setup Time   Final    IN BOTH AEROBIC AND ANAEROBIC BOTTLES GRAM NEGATIVE RODS CRITICAL RESULT CALLED TO, READ BACK BY AND VERIFIED WITH: [facility] Peggyann Juba [phone] FCP Performed at [facility], [address], [place], [place] [address]    Culture GRAM NEGATIVE RODS  Final   Report Status PENDING  Incomplete  Blood Culture ID Panel (Reflexed)     Status: Abnormal   Collection Time: 04/30/20  3:53 PM  Result Value Ref Range Status   Enterococcus faecalis NOT DETECTED NOT DETECTED Final   Enterococcus Faecium NOT DETECTED NOT DETECTED Final   Listeria monocytogenes NOT DETECTED NOT DETECTED Final   Staphylococcus species NOT DETECTED NOT DETECTED Final   Staphylococcus aureus (BCID) NOT DETECTED NOT DETECTED Final   Staphylococcus epidermidis NOT DETECTED NOT DETECTED Final   Staphylococcus lugdunensis NOT DETECTED NOT DETECTED Final   Streptococcus species NOT DETECTED NOT DETECTED Final   Streptococcus agalactiae NOT DETECTED  NOT DETECTED Final   Streptococcus pneumoniae NOT DETECTED NOT DETECTED Final   Streptococcus pyogenes NOT DETECTED NOT DETECTED Final   A.calcoaceticus-baumannii NOT DETECTED NOT DETECTED Final   Bacteroides fragilis NOT DETECTED NOT DETECTED Final   Enterobacterales DETECTED (A) NOT DETECTED Final    Comment: Enterobacterales represent a large order of gram negative bacteria, not a single organism. CRITICAL RESULT CALLED TO, READ BACK BY AND VERIFIED WITH: PHARMD ERIN WILLIAMSON [phone] FCP    Enterobacter cloacae complex NOT DETECTED NOT DETECTED Final   Escherichia coli DETECTED (A) NOT DETECTED Final    Comment: CRITICAL RESULT CALLED TO, READ BACK BY AND VERIFIED WITH: PHARMD ERIN WILLIAMSON [phone] FCP    Klebsiella aerogenes NOT DETECTED NOT DETECTED Final   Klebsiella oxytoca NOT DETECTED NOT DETECTED Final   Klebsiella pneumoniae NOT DETECTED  NOT DETECTED Final   Proteus species NOT DETECTED NOT DETECTED Final   Salmonella species NOT DETECTED NOT DETECTED Final   Serratia marcescens NOT DETECTED NOT DETECTED Final   Haemophilus influenzae NOT DETECTED NOT DETECTED Final   Neisseria meningitidis NOT DETECTED NOT DETECTED Final   Pseudomonas aeruginosa NOT DETECTED NOT DETECTED Final   Stenotrophomonas maltophilia NOT DETECTED NOT DETECTED Final   Candida albicans NOT DETECTED NOT DETECTED Final   Candida auris NOT DETECTED NOT DETECTED Final   Candida glabrata NOT DETECTED NOT DETECTED Final   Candida krusei NOT DETECTED NOT DETECTED Final   Candida parapsilosis NOT DETECTED NOT DETECTED Final   Candida tropicalis NOT DETECTED NOT DETECTED Final   Cryptococcus neoformans/gattii NOT DETECTED NOT DETECTED Final   CTX-M ESBL NOT DETECTED NOT DETECTED Final   Carbapenem resistance IMP NOT DETECTED NOT DETECTED Final   Carbapenem resistance KPC NOT DETECTED NOT DETECTED Final   Carbapenem resistance NDM NOT DETECTED NOT DETECTED Final   Carbapenem resist OXA 48 LIKE NOT DETECTED NOT DETECTED Final   Carbapenem resistance VIM NOT DETECTED NOT DETECTED Final    Comment: Performed at [facility], [address], [place], [place] [address]  Resp Panel by RT-PCR (Flu A&B, Covid) Nasopharyngeal Swab     Status: None   Collection Time: 04/30/20  3:58 PM   Specimen: Nasopharyngeal Swab; Nasopharyngeal(NP) swabs in vial transport medium  Result Value Ref Range Status   SARS Coronavirus 2 by RT PCR NEGATIVE NEGATIVE Final    Comment: (NOTE) SARS-CoV-2 target nucleic acids are NOT DETECTED.  The SARS-CoV-2 RNA is generally detectable in upper respiratory specimens during the acute phase of infection. The lowest concentration of SARS-CoV-2 viral copies this assay can detect is 138 copies/mL. A negative result does not preclude SARS-Cov-2 infection and should not be used as the sole basis for treatment or other patient management  decisions. A negative result may occur with  improper specimen collection/handling, submission of specimen other than nasopharyngeal swab, presence of viral mutation(s) within the areas targeted by this assay, and inadequate number of viral copies(<138 copies/mL). A negative result must be combined with clinical observations, patient history, and epidemiological information. The expected result is Negative.  Fact Sheet for Patients:  EntrepreneurPulse.com.au  Fact Sheet for Healthcare Providers:  IncredibleEmployment.be  This test is no t yet approved or cleared by the [place] FDA and  has been authorized for detection and/or diagnosis of SARS-CoV-2 by FDA under an Emergency Use Authorization (EUA). This EUA will remain  in effect (meaning this test can be used) for the duration of the COVID-19 declaration under Section 564(b)(1) of the  Act, 21 U.S.C.section 360bbb-3(b)(1), unless the authorization is terminated  or revoked sooner.       Influenza A by PCR NEGATIVE NEGATIVE Final   Influenza B by PCR NEGATIVE NEGATIVE Final    Comment: (NOTE) The Xpert Xpress SARS-CoV-2/FLU/RSV plus assay is intended as an aid in the diagnosis of influenza from Nasopharyngeal swab specimens and should not be used as a sole basis for treatment. Nasal washings and aspirates are unacceptable for Xpert Xpress SARS-CoV-2/FLU/RSV testing.  Fact Sheet for Patients: EntrepreneurPulse.com.au  Fact Sheet for Healthcare Providers: IncredibleEmployment.be  This test is not yet approved or cleared by the [place] FDA and has been authorized for detection and/or diagnosis of SARS-CoV-2 by FDA under an Emergency Use Authorization (EUA). This EUA will remain in effect (meaning this test can be used) for the duration of the COVID-19 declaration under Section 564(b)(1) of the Act, 21 U.S.C. section 360bbb-3(b)(1), unless the  authorization is terminated or revoked.  Performed at [facility], [place] [address] [place], [place] [address]         Radiology Studies: CT ABDOMEN PELVIS W CONTRAST  Result Date: 04/30/2020 CLINICAL DATA:  Abdominal pain and fever. Patient reports catheter blockage causing urinary retention. Rectal bleeding. History of prostate cancer. EXAM: CT ABDOMEN AND PELVIS WITH CONTRAST TECHNIQUE: Multidetector CT imaging of the abdomen and pelvis was performed using the standard protocol following bolus administration of intravenous contrast. CONTRAST:  113mL OMNIPAQUE IOHEXOL 300 MG/ML  SOLN COMPARISON:  Noncontrast abdominal CT 01/27/2019, abdominal MRI 02/15/2019 FINDINGS: Lower chest: Subsegmental atelectasis in the left lower lobe. No acute airspace disease or pleural effusion. Normal heart size. Hepatobiliary: No focal liver abnormality is seen. No gallstones, gallbladder wall thickening, or biliary dilatation. Pancreas: There is a 16 mm low-density lesion in the pancreatic head, series 2, image 24. No associated ductal dilatation. No peripancreatic fat stranding. Spleen: Normal in size without focal abnormality. Adrenals/Urinary Tract: No adrenal nodule. Enhancing mass in the periphery of the right mid kidney measures 2.5 x 1.4 cm. This is grossly unchanged in size from prior noncontrast CT. Additional small hypodensities in the right kidney, largest measuring 11 mm, too small to characterize but likely cysts. Tiny cortical low densities in the left kidney, also likely cysts and too small to characterize. There is no hydronephrosis. No perinephric edema. Symmetric excretion on delayed phase imaging. Foley catheter within the urinary bladder. Circumferential bladder wall thickening with mild perivesicular fat stranding. Stomach/Bowel: Tiny hiatal hernia. Stomach otherwise unremarkable. No small bowel obstruction or inflammatory change. Normal appendix. Moderate colonic stool burden.  Prominent sigmoid colonic diverticulosis. No diverticulitis or acute colonic inflammation. Vascular/Lymphatic: Aortic atherosclerosis and tortuosity. Patent portal vein. Prior pelvic lymph node dissection, surgical clips create regional streak artifact. No enlarged lymph nodes. Reproductive: Prostatectomy. Penile prosthesis with reservoir in the right anterior pelvis. Other: No ascites or free air.  No focal fluid collection. Musculoskeletal: Scoliosis and diffuse degenerative change in the spine. There are no acute or suspicious osseous abnormalities. IMPRESSION: 1. Circumferential bladder wall thickening with mild perivesicular fat stranding, suspicious for cystitis. Foley catheter in the urinary bladder. 2. Enhancing mass in the periphery of the right mid kidney measuring 2.5 x 1.4 cm, grossly unchanged in size from prior imaging allowing for differences in modality. This remains suspicious for renal cell carcinoma. 3. Low-density lesion in the pancreatic head measuring 16 mm. This is unchanged in size from prior MRI. 4. Colonic diverticulosis without diverticulitis. Aortic Atherosclerosis (ICD10-I70.0). Electronically Signed   By:  Keith Rake M.D.   On: 04/30/2020 18:34   DG Chest Portable 1 View  Result Date: 04/30/2020 CLINICAL DATA:  Fever. EXAM: PORTABLE CHEST 1 VIEW COMPARISON:  03/22/2019 FINDINGS: 1603 hours. The lungs are clear without focal pneumonia, edema, pneumothorax or pleural effusion. The cardiopericardial silhouette is within normal limits for size. Right shoulder replacement. Telemetry leads overlie the chest. IMPRESSION: No acute findings. Electronically Signed   By: Misty Stanley M.D.   On: 04/30/2020 16:14        Scheduled Meds: . atorvastatin  40 mg Oral Daily  . Chlorhexidine Gluconate Cloth  6 each Topical Daily  . docusate sodium  100 mg Oral BID  . memantine  10 mg Oral BID  . senna  1 tablet Oral BID   Continuous Infusions: . cefTRIAXone (ROCEPHIN)  IV 2 g  (05/01/20 1020)     LOS: 0 days     Cordelia Poche, MD [facility] 05/01/2020, 11:42 AM  If 7PM-7AM, please contact night-coverage [email or web address]

## 2020-05-02 DIAGNOSIS — E785 Hyperlipidemia, unspecified: Secondary | ICD-10-CM | POA: Diagnosis not present

## 2020-05-02 DIAGNOSIS — N390 Urinary tract infection, site not specified: Secondary | ICD-10-CM | POA: Diagnosis not present

## 2020-05-02 DIAGNOSIS — B9620 Unspecified Escherichia coli [E. coli] as the cause of diseases classified elsewhere: Secondary | ICD-10-CM | POA: Diagnosis not present

## 2020-05-02 DIAGNOSIS — R339 Retention of urine, unspecified: Secondary | ICD-10-CM | POA: Diagnosis not present

## 2020-05-02 DIAGNOSIS — N3001 Acute cystitis with hematuria: Secondary | ICD-10-CM | POA: Diagnosis not present

## 2020-05-02 LAB — CBC
HCT: 32.4 % — ABNORMAL LOW (ref 39.0–52.0)
Hemoglobin: 10.4 g/dL — ABNORMAL LOW (ref 13.0–17.0)
MCH: 30.1 pg (ref 26.0–34.0)
MCHC: 32.1 g/dL (ref 30.0–36.0)
MCV: 93.9 fL (ref 80.0–100.0)
Platelets: 224 10*3/uL (ref 150–400)
RBC: 3.45 MIL/uL — ABNORMAL LOW (ref 4.22–5.81)
RDW: 13.8 % (ref 11.5–15.5)
WBC: 6.9 10*3/uL (ref 4.0–10.5)
nRBC: 0 % (ref 0.0–0.2)

## 2020-05-02 LAB — BASIC METABOLIC PANEL
Anion gap: 9 (ref 5–15)
BUN: 24 mg/dL — ABNORMAL HIGH (ref 8–23)
CO2: 22 mmol/L (ref 22–32)
Calcium: 8.1 mg/dL — ABNORMAL LOW (ref 8.9–10.3)
Chloride: 102 mmol/L (ref 98–111)
Creatinine, Ser: 1.17 mg/dL (ref 0.61–1.24)
GFR, Estimated: 60 mL/min — ABNORMAL LOW (ref >60)
Glucose, Bld: 122 mg/dL — ABNORMAL HIGH (ref 70–99)
Potassium: 3.7 mmol/L (ref 3.5–5.1)
Sodium: 133 mmol/L — ABNORMAL LOW (ref 135–145)

## 2020-05-02 LAB — TSH: TSH: 2.089 u[IU]/mL (ref 0.350–4.500)

## 2020-05-02 NOTE — Progress Notes (Signed)
    [facility] for [facility]ious Disease   Reason for visit: Follow up on GNR bacteremia  Interval History: E coli in culture, sensitivities pending.   Physical Exam: Constitutional:  Vitals:   05/02/20 0632 05/02/20 1701  BP: 134/74 138/76  Pulse: 65 64  Resp:  16  Temp: 99.3 F (37.4 C)   SpO2:  97%   patient appears in NAD  Impression: GNR bacteremia.  Largely asymptomatic at this time.  Can use oral beta lactam if sensitive for 7 days total.  Should be back tomorrow.   Plan: 1.  Continue ceftriaxone.

## 2020-05-02 NOTE — Evaluation (Signed)
Physical Therapy Evaluation Patient Details Name: Andre Casey. MRN: [id number] DOB: [birth year] Today's Date: 05/02/2020   History of Present Illness  Andre Casey. is a 87 y.o. male with a history of hypertension, dementia, hyperlipidemia, CAD, prostate cancer s/p surgery and radiation. Patient presented secondary to urinary retention and hematuria with concern for UTI and found to have E. Coli bacteremia.   To ED several times with catheter issues.  Clinical Impression  The patient  Required min assistance for bed mobility and short  Ambulation in room holding onto IV pole.. Patient resides with wife who I requires assistance for ADL's. Family available, wife has other caregivers. Patient ambulatory without AD PTA.  Pt admitted with above diagnosis.  Pt currently with functional limitations due to the deficits listed below (see PT Problem List). Pt will benefit from skilled PT to increase their independence and safety with mobility to allow discharge to the venue listed below.     Follow Up Recommendations Home health PT    Equipment Recommendations  None recommended by PT (says he can use  his wife's)    Recommendations for Other Services       Precautions / Restrictions Precautions Precautions: Fall Precaution Comments: may do better with his shoes      Mobility  Bed Mobility Overal bed mobility: Needs Assistance Bed Mobility: Supine to Sit     Supine to sit: Min assist     General bed mobility comments: min assist to  fully sit up right and toscoot to bed edge    Transfers Overall transfer level: Needs assistance Equipment used: 1 person hand held assist Transfers: Sit to/from Stand Sit to Stand: Min assist         General transfer comment: steady assist to stand up from bed , cues for sitting  Ambulation/Gait Ambulation/Gait assistance: Min assist Gait Distance (Feet): 20 Feet Assistive device: IV Pole;1 person hand held assist Gait  Pattern/deviations: Step-to pattern;Step-through pattern Gait velocity: decr   General Gait Details: slow pace, did need UE support on IV pole and  1 HHA  Stairs            Wheelchair Mobility    Modified Rankin (Stroke Patients Only)       Balance Overall balance assessment: Needs assistance Sitting-balance support: Feet supported;No upper extremity supported Sitting balance-Leahy Scale: Good     Standing balance support: During functional activity;No upper extremity supported Standing balance-Leahy Scale: Poor Standing balance comment: reliant on 1 UE support                             Pertinent Vitals/Pain Pain Assessment: 0-10 Pain Score: 7  Pain Location: penis Pain Descriptors / Indicators: Discomfort Pain Intervention(s): Monitored during session;Patient requesting pain meds-RN notified;RN gave pain meds during session    Home Living Family/patient expects to be discharged to:: Private residence Living Arrangements: Spouse/significant other;Children Available Help at Discharge: Available 24 hours/day Type of Home: House Home Access: Stairs to enter Entrance Stairs-Rails: Psychiatric nurse of Steps: [place]: Environmental consultant - 2 wheels;Cane - single point;Wheelchair - manual Additional Comments: wife requires assistance with ADL's, has her own caregivers.    Prior Function Level of Independence: Needs assistance   Gait / Transfers Assistance Needed: in house ambulates withoput AD.  ADL's / Homemaking Assistance Needed: Independnet        Hand Dominance        Extremity/Trunk  Assessment   Upper Extremity Assessment Upper Extremity Assessment: Generalized weakness    Lower Extremity Assessment Lower Extremity Assessment: Generalized weakness       Communication   Communication: HOH  Cognition Arousal/Alertness: Awake/alert Behavior During Therapy: WFL for tasks assessed/performed Overall Cognitive Status:  Within Functional Limits for tasks assessed                                        General Comments      Exercises     Assessment/Plan    PT Assessment Patient needs continued PT services  PT Problem List Decreased strength;Decreased activity tolerance;Decreased balance;Decreased knowledge of use of DME;Decreased mobility;Decreased knowledge of precautions;Pain       PT Treatment Interventions DME instruction;Therapeutic activities;Gait training;Therapeutic exercise;Patient/family education;Functional mobility training;Balance training    PT Goals (Current goals can be found in the Care Plan section)  Acute Rehab PT Goals Patient Stated Goal: go home PT Goal Formulation: With patient Time For Goal Achievement: 05/16/20 Potential to Achieve Goals: Good    Frequency Min 3X/week   Barriers to discharge        Co-evaluation               AM-PAC PT "6 Clicks" Mobility  Outcome Measure Help needed turning from your back to your side while in a flat bed without using bedrails?: A Little Help needed moving from lying on your back to sitting on the side of a flat bed without using bedrails?: A Little Help needed moving to and from a bed to a chair (including a wheelchair)?: A Little Help needed standing up from a chair using your arms (e.g., wheelchair or bedside chair)?: A Little Help needed to walk in hospital room?: A Little Help needed climbing 3-5 steps with a railing? : A Lot 6 Click Score: 17    End of Session Equipment Utilized During Treatment: Gait belt Activity Tolerance: Patient limited by fatigue;Patient limited by pain Patient left: in chair;with call bell/phone within reach;with chair alarm set;with family/visitor present;with nursing/sitter in room Nurse Communication: Mobility status PT Visit Diagnosis: Unsteadiness on feet (R26.81)    Time: 0962-8366 PT Time Calculation (min) (ACUTE ONLY): 20 min   Charges:   PT Evaluation $PT  Eval Low Complexity: [facility] Acute Rehabilitation Services Pager [phone] Office [phone]   Claretha Cooper 05/02/2020, 1:15 PM

## 2020-05-02 NOTE — Progress Notes (Signed)
[facility]                                                                              Patient Demographics  Andre Casey, is a 87 y.o. male, DOB - 08-14-1929, OIN:[id number]  Admit date - 04/30/2020   Admitting Physician Toy Baker, MD  Outpatient Primary MD for the patient is [name], MD  Outpatient specialists:   LOS - 1  days   Medical records reviewed and are as summarized below:    Chief Complaint  Patient presents with  . Rectal Bleeding  . Urinary Retention       Brief summary   Andre Casey. is a 87 y.o. male with a history of hypertension, dementia, hyperlipidemia, CAD, prostate cancer s/p surgery and radiation. Patient presented secondary to urinary retention and hematuria with concern for UTI and found to have E. Coli bacteremia.   Assessment & Plan    Principal Problem: Acute urinary retention -Secondary to known urethral stricture. -Urology was consulted, recommended to keep Foley in and follow-up with outpatient urology -Continue Foley catheter  Active Problems: Gross hematuria -In the setting of prostate cancer with radiation therapy, recurrent issue, complicated by UTI and urethral stricture -Currently improving, H&H stable  E. coli bacteremia, E. coli UTI -Blood cultures positive for E. Coli.  Urine culture positive for E. coli, -ID consulted, sensitivities pending, continue ceftriaxone  Essential hypertension -BP currently stable, continue amlodipine, Coreg -Holding lisinopril, HCTZ  Hyperlipidemia -Continue Lipitor  Malignant neoplasm of prostate -Patient follows with urology at [facility]  Asymptomatic SVT -Earlier this morning and overnight, patient had an episode of SVT, EKG showed old LBBB -Asymptomatic, however due to recurrent episodes, will check a 2D echo -Correct electrolyte imbalances    Mild cognitive impairment -Continue Namenda  Acute blood loss anemia -In the  setting of hematuria, FOBT also positive, has history of hemorrhoids. -No gross GI bleeding.  Family unsure of last colonoscopy but showed that he had in the past -H&H currently stable, outpatient follow-up with PCP and GI  Underweight Estimated body mass index is 19.51 kg/m as calculated from the following:   Height as of this encounter: 5\' 7"  (1.702 m).   Weight as of this encounter: 56.5 kg.  Continue nutritional supplements  Code Status: DNR DVT Prophylaxis:  SCDs Start: 04/30/20 2316   Level of Care: Level of care: Telemetry Family Communication: Discussed all imaging results, lab results, explained to the patient    Disposition Plan:     Status is: Inpatient  Remains inpatient appropriate because:Inpatient level of care appropriate due to severity of illness   Dispo: The patient is from: Home              Anticipated d/c is to: Home              Patient currently is not medically stable to d/c.  Awaiting sensitivities for E. coli bacteremia, 2D echo.  Hopefully DC next 24 to 48 hours   Difficult to place patient No      Time Spent in minutes   35 minutes  Procedures:  Foley catheter placement  Consultants:   Urology  Antimicrobials:   Anti-infectives (From admission, onward)   Start     Dose/Rate Route Frequency Ordered Stop   05/01/20 1600  cefTRIAXone (ROCEPHIN) 1 g in sodium chloride 0.9 % 100 mL IVPB  Status:  Discontinued        1 g 200 mL/hr over 30 Minutes Intravenous Every 24 hours 04/30/20 1945 05/01/20 0757   05/01/20 1000  cefTRIAXone (ROCEPHIN) 2 g in sodium chloride 0.9 % 100 mL IVPB        2 g 200 mL/hr over 30 Minutes Intravenous Every 24 hours 05/01/20 0757     04/30/20 1630  cefTRIAXone (ROCEPHIN) 1 g in sodium chloride 0.9 % 100 mL IVPB        1 g 200 mL/hr over 30 Minutes Intravenous  Once 04/30/20 1625 04/30/20 1705         Medications  Scheduled Meds: . amLODipine  5 mg Oral Daily  . atorvastatin  40 mg Oral Daily  .  carvedilol  6.25 mg Oral BID WC  . Chlorhexidine Gluconate Cloth  6 each Topical Daily  . docusate sodium  100 mg Oral BID  . memantine  10 mg Oral BID  . senna  1 tablet Oral BID   Continuous Infusions: . cefTRIAXone (ROCEPHIN)  IV 2 g (05/02/20 1102)   PRN Meds:.acetaminophen **OR** acetaminophen, bisacodyl, HYDROcodone-acetaminophen, polyethylene glycol, sodium phosphate, traMADol      Subjective:   Andre Casey was seen and examined today.  Overnight and earlier this morning had episode of asymptomatic SVT. Patient denies dizziness, chest pain, shortness of breath, abdominal pain, N/V/D/C.  Spiking low-grade fevers 100.5 F  Objective:   Vitals:   05/01/20 2123 05/01/20 2303 05/02/20 0518 05/02/20 0632  BP: (!) 93/48 109/63 (!) 168/84 134/74  Pulse: 60 61 67 65  Resp: 20  17   Temp: 100 F (37.8 C) 98.2 F (36.8 C) (!) 100.5 F (38.1 C) 99.3 F (37.4 C)  TempSrc: Oral  Oral   SpO2: 95% 96% 97%   Weight:      Height: 5\' 7"  (1.702 m)       Intake/Output Summary (Last 24 hours) at 05/02/2020 1522 Last data filed at 05/02/2020 1500 Gross per 24 hour  Intake 320 ml  Output 1925 ml  Net -1605 ml     Wt Readings from Last 3 Encounters:  04/30/20 56.5 kg  04/29/20 54 kg  04/03/20 58.9 kg     Exam  General: Alert and oriented x 3, NAD  Cardiovascular: S1 S2 auscultated, no murmurs, RRR  Respiratory: Clear to auscultation bilaterally, no wheezing, rales or rhonchi  Gastrointestinal: Soft, nontender, nondistended, + bowel sounds  Ext: no pedal edema bilaterally  Neuro: no new deficits  Musculoskeletal: No digital cyanosis, clubbing  Skin: No rashes  Psych: pleasant and cooperative   Data Reviewed:  I have personally reviewed following labs and imaging studies  Micro Results Recent Results (from the past 240 hour(s))  Blood culture (routine x 2)     Status: None (Preliminary result)   Collection Time: 04/30/20  3:00 PM   Specimen: BLOOD LEFT  WRIST  Result Value Ref Range Status   Specimen Description   Final    BLOOD LEFT WRIST Performed at [facility], [place] [address] [place], [place] [address]    Special Requests   Final    BOTTLES DRAWN AEROBIC AND ANAEROBIC Blood Culture adequate volume Performed at [place]  [address] [place], [place] [address]    Culture   Final    NO GROWTH 2 DAYS Performed at [place] [address], [place], [place] [address]    Report Status PENDING  Incomplete  Urine culture     Status: Abnormal (Preliminary result)   Collection Time: 04/30/20  3:23 PM   Specimen: Urine, Random  Result Value Ref Range Status   Specimen Description   Final    URINE, RANDOM Performed at [place] [address], [place], [place] [address]    Special Requests   Final    NONE Performed at [facility], [place] [address] [place], [place] [address]    Culture (A)  Final    >=100,000 COLONIES/mL ESCHERICHIA COLI SUSCEPTIBILITIES TO FOLLOW Performed at [facility], [place] [address] [place], [place] [address]    Report Status PENDING  Incomplete  Blood culture (routine x 2)     Status: Abnormal (Preliminary result)   Collection Time: 04/30/20  3:53 PM   Specimen: BLOOD  Result Value Ref Range Status   Specimen Description   Final    BLOOD RIGHT ANTECUBITAL Performed at [place] [address], [place], [place] [address]    Special Requests   Final    BOTTLES DRAWN AEROBIC AND ANAEROBIC Blood Culture adequate volume Performed at [place] [address] [place], [place] [address]    Culture  Setup Time   Final    IN BOTH AEROBIC AND ANAEROBIC BOTTLES GRAM NEGATIVE RODS CRITICAL RESULT CALLED TO, READ BACK BY AND VERIFIED WITH: PHARMD Peggyann Juba [phone] FCP    Culture (A)  Final    ESCHERICHIA COLI SUSCEPTIBILITIES TO FOLLOW Performed at  [facility], [place] [address] [place], [place] [address]    Report Status PENDING  Incomplete  Blood Culture ID Panel (Reflexed)     Status: Abnormal   Collection Time: 04/30/20  3:53 PM  Result Value Ref Range Status   Enterococcus faecalis NOT DETECTED NOT DETECTED Final   Enterococcus Faecium NOT DETECTED NOT DETECTED Final   Listeria monocytogenes NOT DETECTED NOT DETECTED Final   Staphylococcus species NOT DETECTED NOT DETECTED Final   Staphylococcus aureus (BCID) NOT DETECTED NOT DETECTED Final   Staphylococcus epidermidis NOT DETECTED NOT DETECTED Final   Staphylococcus lugdunensis NOT DETECTED NOT DETECTED Final   Streptococcus species NOT DETECTED NOT DETECTED Final   Streptococcus agalactiae NOT DETECTED NOT DETECTED Final   Streptococcus pneumoniae NOT DETECTED NOT DETECTED Final   Streptococcus pyogenes NOT DETECTED NOT DETECTED Final   A.calcoaceticus-baumannii NOT DETECTED NOT DETECTED Final   Bacteroides fragilis NOT DETECTED NOT DETECTED Final   Enterobacterales DETECTED (A) NOT DETECTED Final    Comment: Enterobacterales represent a large order of gram negative bacteria, not a single organism. CRITICAL RESULT CALLED TO, READ BACK BY AND VERIFIED WITH: PHARMD ERIN WILLIAMSON [phone] FCP    Enterobacter cloacae complex NOT DETECTED NOT DETECTED Final   Escherichia coli DETECTED (A) NOT DETECTED Final    Comment: CRITICAL RESULT CALLED TO, READ BACK BY AND VERIFIED WITH: PHARMD ERIN WILLIAMSON [phone] FCP    Klebsiella aerogenes NOT DETECTED NOT DETECTED Final   Klebsiella oxytoca NOT DETECTED NOT DETECTED Final   Klebsiella pneumoniae NOT DETECTED NOT DETECTED Final   Proteus species NOT DETECTED NOT DETECTED Final   Salmonella species NOT DETECTED NOT DETECTED Final   Serratia marcescens NOT DETECTED  NOT DETECTED Final   Haemophilus influenzae NOT DETECTED NOT DETECTED Final   Neisseria meningitidis NOT DETECTED NOT DETECTED Final   Pseudomonas  aeruginosa NOT DETECTED NOT DETECTED Final   Stenotrophomonas maltophilia NOT DETECTED NOT DETECTED Final   Candida albicans NOT DETECTED NOT DETECTED Final   Candida auris NOT DETECTED NOT DETECTED Final   Candida glabrata NOT DETECTED NOT DETECTED Final   Candida krusei NOT DETECTED NOT DETECTED Final   Candida parapsilosis NOT DETECTED NOT DETECTED Final   Candida tropicalis NOT DETECTED NOT DETECTED Final   Cryptococcus neoformans/gattii NOT DETECTED NOT DETECTED Final   CTX-M ESBL NOT DETECTED NOT DETECTED Final   Carbapenem resistance IMP NOT DETECTED NOT DETECTED Final   Carbapenem resistance KPC NOT DETECTED NOT DETECTED Final   Carbapenem resistance NDM NOT DETECTED NOT DETECTED Final   Carbapenem resist OXA 48 LIKE NOT DETECTED NOT DETECTED Final   Carbapenem resistance VIM NOT DETECTED NOT DETECTED Final    Comment: Performed at [facility], [address], [place], [place] [address]  Resp Panel by RT-PCR (Flu A&B, Covid) Nasopharyngeal Swab     Status: None   Collection Time: 04/30/20  3:58 PM   Specimen: Nasopharyngeal Swab; Nasopharyngeal(NP) swabs in vial transport medium  Result Value Ref Range Status   SARS Coronavirus 2 by RT PCR NEGATIVE NEGATIVE Final    Comment: (NOTE) SARS-CoV-2 target nucleic acids are NOT DETECTED.  The SARS-CoV-2 RNA is generally detectable in upper respiratory specimens during the acute phase of infection. The lowest concentration of SARS-CoV-2 viral copies this assay can detect is 138 copies/mL. A negative result does not preclude SARS-Cov-2 infection and should not be used as the sole basis for treatment or other patient management decisions. A negative result may occur with  improper specimen collection/handling, submission of specimen other than nasopharyngeal swab, presence of viral mutation(s) within the areas targeted by this assay, and inadequate number of viral copies(<138 copies/mL). A negative result must be combined  with clinical observations, patient history, and epidemiological information. The expected result is Negative.  Fact Sheet for Patients:  EntrepreneurPulse.com.au  Fact Sheet for Healthcare Providers:  IncredibleEmployment.be  This test is no t yet approved or cleared by the [place] FDA and  has been authorized for detection and/or diagnosis of SARS-CoV-2 by FDA under an Emergency Use Authorization (EUA). This EUA will remain  in effect (meaning this test can be used) for the duration of the COVID-19 declaration under Section 564(b)(1) of the Act, 21 U.S.C.section 360bbb-3(b)(1), unless the authorization is terminated  or revoked sooner.       Influenza A by PCR NEGATIVE NEGATIVE Final   Influenza B by PCR NEGATIVE NEGATIVE Final    Comment: (NOTE) The Xpert Xpress SARS-CoV-2/FLU/RSV plus assay is intended as an aid in the diagnosis of influenza from Nasopharyngeal swab specimens and should not be used as a sole basis for treatment. Nasal washings and aspirates are unacceptable for Xpert Xpress SARS-CoV-2/FLU/RSV testing.  Fact Sheet for Patients: EntrepreneurPulse.com.au  Fact Sheet for Healthcare Providers: IncredibleEmployment.be  This test is not yet approved or cleared by the [place] FDA and has been authorized for detection and/or diagnosis of SARS-CoV-2 by FDA under an Emergency Use Authorization (EUA). This EUA will remain in effect (meaning this test can be used) for the duration of the COVID-19 declaration under Section 564(b)(1) of the Act, 21 U.S.C. section 360bbb-3(b)(1), unless the authorization is terminated or revoked.  Performed at [facility], [address] [place], [place] [address]     Radiology Reports CT ABDOMEN PELVIS W CONTRAST  Result Date: 04/30/2020 CLINICAL DATA:  Abdominal pain and fever. Patient reports catheter blockage causing  urinary retention. Rectal bleeding. History of prostate cancer. EXAM: CT ABDOMEN AND PELVIS WITH CONTRAST TECHNIQUE: Multidetector CT imaging of the abdomen and pelvis was performed using the standard protocol following bolus administration of intravenous contrast. CONTRAST:  145mL OMNIPAQUE IOHEXOL 300 MG/ML  SOLN COMPARISON:  Noncontrast abdominal CT 01/27/2019, abdominal MRI 02/15/2019 FINDINGS: Lower chest: Subsegmental atelectasis in the left lower lobe. No acute airspace disease or pleural effusion. Normal heart size. Hepatobiliary: No focal liver abnormality is seen. No gallstones, gallbladder wall thickening, or biliary dilatation. Pancreas: There is a 16 mm low-density lesion in the pancreatic head, series 2, image 24. No associated ductal dilatation. No peripancreatic fat stranding. Spleen: Normal in size without focal abnormality. Adrenals/Urinary Tract: No adrenal nodule. Enhancing mass in the periphery of the right mid kidney measures 2.5 x 1.4 cm. This is grossly unchanged in size from prior noncontrast CT. Additional small hypodensities in the right kidney, largest measuring 11 mm, too small to characterize but likely cysts. Tiny cortical low densities in the left kidney, also likely cysts and too small to characterize. There is no hydronephrosis. No perinephric edema. Symmetric excretion on delayed phase imaging. Foley catheter within the urinary bladder. Circumferential bladder wall thickening with mild perivesicular fat stranding. Stomach/Bowel: Tiny hiatal hernia. Stomach otherwise unremarkable. No small bowel obstruction or inflammatory change. Normal appendix. Moderate colonic stool burden. Prominent sigmoid colonic diverticulosis. No diverticulitis or acute colonic inflammation. Vascular/Lymphatic: Aortic atherosclerosis and tortuosity. Patent portal vein. Prior pelvic lymph node dissection, surgical clips create regional streak artifact. No enlarged lymph nodes. Reproductive: Prostatectomy.  Penile prosthesis with reservoir in the right anterior pelvis. Other: No ascites or free air.  No focal fluid collection. Musculoskeletal: Scoliosis and diffuse degenerative change in the spine. There are no acute or suspicious osseous abnormalities. IMPRESSION: 1. Circumferential bladder wall thickening with mild perivesicular fat stranding, suspicious for cystitis. Foley catheter in the urinary bladder. 2. Enhancing mass in the periphery of the right mid kidney measuring 2.5 x 1.4 cm, grossly unchanged in size from prior imaging allowing for differences in modality. This remains suspicious for renal cell carcinoma. 3. Low-density lesion in the pancreatic head measuring 16 mm. This is unchanged in size from prior MRI. 4. Colonic diverticulosis without diverticulitis. Aortic Atherosclerosis (ICD10-I70.0). Electronically Signed   By: Keith Rake M.D.   On: 04/30/2020 18:34   DG Chest Portable 1 View  Result Date: 04/30/2020 CLINICAL DATA:  Fever. EXAM: PORTABLE CHEST 1 VIEW COMPARISON:  03/22/2019 FINDINGS: 1603 hours. The lungs are clear without focal pneumonia, edema, pneumothorax or pleural effusion. The cardiopericardial silhouette is within normal limits for size. Right shoulder replacement. Telemetry leads overlie the chest. IMPRESSION: No acute findings. Electronically Signed   By: Misty Stanley M.D.   On: 04/30/2020 16:14   VAS US CAROTID  Result Date: 04/04/2020 Carotid Arterial Duplex Study Indications:  Carotid atherosclerosis. Patient denies cerebrovascular symptoms,               but is having memory issues and is followed by Neurology for this. Risk Factors: Hypertension, hyperlipidemia, past history of smoking, coronary               artery disease. Performing Technologist: Mariane Masters RVT  Examination Guidelines: A complete evaluation includes B-mode imaging, spectral Doppler, color Doppler, and power Doppler  as needed of all accessible portions of each vessel. Bilateral testing is  considered an integral part of a complete examination. Limited examinations for reoccurring indications may be performed as noted.  Right Carotid Findings: +----------+--------+--------+--------+------------------+--------+           PSV cm/sEDV cm/sStenosisPlaque DescriptionComments +----------+--------+--------+--------+------------------+--------+ CCA Prox  71      10                                tortuous +----------+--------+--------+--------+------------------+--------+ CCA Distal76      16      <50%    smooth                     +----------+--------+--------+--------+------------------+--------+ ICA Prox  100     18              smooth                     +----------+--------+--------+--------+------------------+--------+ ICA Mid   101     29      1-39%                              +----------+--------+--------+--------+------------------+--------+ ICA Distal92      22                                         +----------+--------+--------+--------+------------------+--------+ ECA       92      6                                          +----------+--------+--------+--------+------------------+--------+ +----------+--------+-------+----------------+-------------------+           PSV cm/sEDV cmsDescribe        Arm Pressure (mmHG) +----------+--------+-------+----------------+-------------------+ [id number]            Multiphasic, [id number]                 +----------+--------+-------+----------------+-------------------+ +---------+--------+--+--------+--+---------+ VertebralPSV cm/s52EDV cm/s10Antegrade +---------+--------+--+--------+--+---------+  Left Carotid Findings: +----------+--------+--------+--------+------------------+------------------+           PSV cm/sEDV cm/sStenosisPlaque DescriptionComments           +----------+--------+--------+--------+------------------+------------------+ CCA Prox  78      17                                                    +----------+--------+--------+--------+------------------+------------------+ CCA Distal63      15                                intimal thickening +----------+--------+--------+--------+------------------+------------------+ ICA Prox  56      15              heterogenous                         +----------+--------+--------+--------+------------------+------------------+ ICA Mid   60      21      1-39%                                        +----------+--------+--------+--------+------------------+------------------+  ICA Distal88      25                                                   +----------+--------+--------+--------+------------------+------------------+ ECA       365     27      >50%                                         +----------+--------+--------+--------+------------------+------------------+ +----------+--------+--------+----------------+-------------------+           PSV cm/sEDV cm/sDescribe        Arm Pressure (mmHG) +----------+--------+--------+----------------+-------------------+ [id number]             Multiphasic, [id number]                 +----------+--------+--------+----------------+-------------------+ +---------+--------+--+--------+--+---------+ VertebralPSV cm/s39EDV cm/s11Antegrade +---------+--------+--+--------+--+---------+   Summary: Right Carotid: Velocities in the right ICA are consistent with a 1-39% stenosis.                Non-hemodynamically significant plaque <50% noted in the CCA. Left Carotid: Velocities in the left ICA are consistent with a 1-39% stenosis.               The ECA appears >50% stenosed. Vertebrals:  Bilateral vertebral arteries demonstrate antegrade flow. Subclavians: Normal flow hemodynamics were seen in bilateral subclavian              arteries. *See table(s) above for measurements and observations.  Electronically signed by Kathlyn Sacramento MD on 04/04/2020 at  9:56:21 AM.    Final     Lab Data:  CBC: Recent Labs  Lab 04/27/20 1257 04/27/20 1305 04/30/20 1457 05/01/20 0350 05/02/20 0334  WBC 10.6*  --  10.1 8.0 6.9  NEUTROABS 7.2  --  7.7 6.1  --   HGB 13.3 13.6 10.9* 10.7* 10.4*  HCT 41.3 40.0 34.0* 33.6* 32.4*  MCV 91.8  --  92.1 92.8 93.9  PLT 322  --  242 222 950   Basic Metabolic Panel: Recent Labs  Lab 04/27/20 1305 04/30/20 1457 04/30/20 1942 05/01/20 0350 05/02/20 0334  NA 139 132*  --  131* 133*  K 4.1 3.3*  --  3.6 3.7  CL 105 97*  --  101 102  CO2  --  22  --  21* 22  GLUCOSE 115* 97  --  116* 122*  BUN 27* 23  --  21 24*  CREATININE 1.20 1.04  --  1.12 1.17  CALCIUM  --  8.7*  --  8.1* 8.1*  MG  --   --  2.3 2.1  --   PHOS  --   --   --  2.5  --    GFR: Estimated Creatinine Clearance: 34.9 mL/min (by C-G formula based on SCr of 1.17 mg/dL). Liver Function Tests: Recent Labs  Lab 05/01/20 0350  AST 18  ALT 16  ALKPHOS 52  BILITOT 0.9  PROT 5.8*  ALBUMIN 3.1*   No results for input(s): LIPASE, AMYLASE in the last 168 hours. No results for input(s): AMMONIA in the last 168 hours. Coagulation Profile: No results for input(s): INR, PROTIME in the last 168 hours. Cardiac Enzymes: Recent Labs  Lab 04/30/20 1942  CKTOTAL 188   BNP (last 3  results) No results for input(s): PROBNP in the last 8760 hours. HbA1C: No results for input(s): HGBA1C in the last 72 hours. CBG: No results for input(s): GLUCAP in the last 168 hours. Lipid Profile: No results for input(s): CHOL, HDL, LDLCALC, TRIG, CHOLHDL, LDLDIRECT in the last 72 hours. Thyroid Function Tests: Recent Labs    05/02/20 0342  TSH 2.089   Anemia Panel: Recent Labs    04/30/20 1532 04/30/20 2112  VITAMINB12  --  323  FOLATE  --  31.3  FERRITIN  --  122  TIBC  --  293  IRON  --  20*  RETICCTPCT 1.8  --    Urine analysis:    Component Value Date/Time   COLORURINE YELLOW 04/30/2020 1523   APPEARANCEUR CLEAR 04/30/2020 1523    LABSPEC 1.012 04/30/2020 1523   PHURINE 5.0 04/30/2020 1523   GLUCOSEU NEGATIVE 04/30/2020 1523   HGBUR SMALL (A) 04/30/2020 1523   BILIRUBINUR NEGATIVE 04/30/2020 1523   KETONESUR 5 (A) 04/30/2020 1523   PROTEINUR NEGATIVE 04/30/2020 1523   UROBILINOGEN 0.2 05/15/2007 1118   NITRITE NEGATIVE 04/30/2020 1523   LEUKOCYTESUR LARGE (A) 04/30/2020 1523     Janeece Blok M.D. [facility] 05/02/2020, 3:22 PM  Available via Epic secure chat 7am-7pm After 7 pm, please refer to night coverage provider listed on amion.

## 2020-05-02 NOTE — Progress Notes (Signed)
Call from tele, patient had SVT's for 17 beats. He is asymptomatic. On-call attending Dr. Hal Hope notified via [place]. Patient need TSH with morning lab collection, lab on unit and notified. Will continue to monitor the patient.

## 2020-05-02 NOTE — Evaluation (Signed)
Occupational Therapy Evaluation Patient Details Name: Andre Casey. MRN: [id number] DOB: Sep 04, 1929 Today's Date: 05/02/2020    History of Present Illness Andre Casey. is a 87 y.o. male with a history of hypertension, dementia, hyperlipidemia, CAD, prostate cancer s/p surgery and radiation. Patient presented secondary to urinary retention and hematuria with concern for UTI and found to have E. Coli bacteremia.   To ED several times with catheter issues.   Clinical Impression   Andre Casey is an 87 year old man who demonstrates good strength of upper and lower extremities, ability to perform functional mobility and ADLs and exhibits good balance. Patient reports penis pain and the catheter is his only issues. No OT needs.    Follow Up Recommendations  No OT follow up    Equipment Recommendations  None recommended by OT    Recommendations for Other Services       Precautions / Restrictions Precautions Precautions: None Precaution Comments: may do better with his shoes Restrictions Weight Bearing Restrictions: No      Mobility Bed Mobility Overal bed mobility: Needs Assistance Bed Mobility: Supine to Sit     Supine to sit: Min assist     General bed mobility comments: OOb in recliner    Transfers Overall transfer level: Needs assistance Equipment used: None Transfers: Sit to/from [employer] Transfers Sit to Stand: Supervision Stand pivot transfers: Supervision       General transfer comment: steady assist to stand up from bed , cues for sitting    Balance Overall balance assessment: No apparent balance deficits (not formally assessed) Sitting-balance support: Feet supported;No upper extremity supported Sitting balance-Leahy Scale: Good     Standing balance support: During functional activity;No upper extremity supported Standing balance-Leahy Scale: Poor Standing balance comment: able to withstand lateral and andterior  nudges without loss of balance                           ADL either performed or assessed with clinical judgement   ADL Overall ADL's : Modified independent                                             Vision Patient Visual Report: No change from baseline       Perception     Praxis      Pertinent Vitals/Pain Pain Assessment: Faces Pain Score: 7  Faces Pain Scale: Hurts a little bit Pain Location: penis Pain Descriptors / Indicators: Discomfort Pain Intervention(s): Monitored during session     Hand Dominance     Extremity/Trunk Assessment Upper Extremity Assessment Upper Extremity Assessment: Overall WFL for tasks assessed   Lower Extremity Assessment Lower Extremity Assessment: Overall WFL for tasks assessed       Communication Communication Communication: HOH   Cognition Arousal/Alertness: Awake/alert Behavior During Therapy: WFL for tasks assessed/performed Overall Cognitive Status: Within Functional Limits for tasks assessed                                     General Comments       Exercises     Shoulder Instructions      Home Living Family/patient expects to be discharged to:: Private residence Living Arrangements: Spouse/significant other;Children Available Help at Discharge: Available  24 hours/day Type of Home: House Home Access: Stairs to enter [employer] of Steps: 4 Entrance Stairs-Rails: Right;Left       Bathroom Shower/Tub: Occupational psychologist: Standard Bathroom Accessibility: Yes   Home Equipment: Environmental consultant - 2 wheels;Cane - single point;Wheelchair - manual   Additional Comments: wife requires assistance with ADL's, has her own caregivers.      Prior Functioning/Environment Level of Independence: Needs assistance  Gait / Transfers Assistance Needed: in house ambulates withoput AD. ADL's / Homemaking Assistance Needed: [employer]            OT Problem  List: Pain      OT Treatment/Interventions:      OT Goals(Current goals can be found in the care plan section) Acute Rehab OT Goals Patient Stated Goal: go home OT Goal Formulation: All assessment and education complete, DC therapy  OT Frequency:     Barriers to D/C:            Co-evaluation              AM-PAC OT "6 Clicks" Daily Activity     Outcome Measure Help from another person eating meals?: None Help from another person taking care of personal grooming?: None Help from another person toileting, which includes using toliet, bedpan, or urinal?: None Help from another person bathing (including washing, rinsing, drying)?: None Help from another person to put on and taking off regular upper body clothing?: None Help from another person to put on and taking off regular lower body clothing?: None 6 Click Score: 24   End of Session Equipment Utilized During Treatment: Gait belt Nurse Communication: Mobility status  Activity Tolerance: Patient tolerated treatment well Patient left: in chair;with call bell/phone within reach;with family/visitor present  OT Visit Diagnosis: Pain                Time: 3845-3646 OT Time Calculation (min): 11 min Charges:  OT General Charges $OT Visit: 1 Visit OT Evaluation $OT Eval Low Complexity: 1 Low  Andre Casey, OTR/L [place]  Office [phone] Pager: [place] 05/02/2020, 3:43 PM

## 2020-05-03 ENCOUNTER — Inpatient Hospital Stay (HOSPITAL_COMMUNITY): Payer: Medicare HMO

## 2020-05-03 DIAGNOSIS — R9431 Abnormal electrocardiogram [ECG] [EKG]: Secondary | ICD-10-CM

## 2020-05-03 DIAGNOSIS — N390 Urinary tract infection, site not specified: Secondary | ICD-10-CM | POA: Diagnosis not present

## 2020-05-03 DIAGNOSIS — R339 Retention of urine, unspecified: Secondary | ICD-10-CM | POA: Diagnosis not present

## 2020-05-03 DIAGNOSIS — B9620 Unspecified Escherichia coli [E. coli] as the cause of diseases classified elsewhere: Secondary | ICD-10-CM | POA: Diagnosis not present

## 2020-05-03 DIAGNOSIS — N3001 Acute cystitis with hematuria: Secondary | ICD-10-CM | POA: Diagnosis not present

## 2020-05-03 DIAGNOSIS — E785 Hyperlipidemia, unspecified: Secondary | ICD-10-CM | POA: Diagnosis not present

## 2020-05-03 LAB — CBC
HCT: 30.1 % — ABNORMAL LOW (ref 39.0–52.0)
Hemoglobin: 9.7 g/dL — ABNORMAL LOW (ref 13.0–17.0)
MCH: 29.6 pg (ref 26.0–34.0)
MCHC: 32.2 g/dL (ref 30.0–36.0)
MCV: 91.8 fL (ref 80.0–100.0)
Platelets: 238 10*3/uL (ref 150–400)
RBC: 3.28 MIL/uL — ABNORMAL LOW (ref 4.22–5.81)
RDW: 13.6 % (ref 11.5–15.5)
WBC: 7.2 10*3/uL (ref 4.0–10.5)
nRBC: 0 % (ref 0.0–0.2)

## 2020-05-03 LAB — BASIC METABOLIC PANEL
Anion gap: 9 (ref 5–15)
BUN: 22 mg/dL (ref 8–23)
CO2: 21 mmol/L — ABNORMAL LOW (ref 22–32)
Calcium: 7.8 mg/dL — ABNORMAL LOW (ref 8.9–10.3)
Chloride: 98 mmol/L (ref 98–111)
Creatinine, Ser: 1.1 mg/dL (ref 0.61–1.24)
GFR, Estimated: >60 mL/min (ref >60)
Glucose, Bld: 109 mg/dL — ABNORMAL HIGH (ref 70–99)
Potassium: 3.6 mmol/L (ref 3.5–5.1)
Sodium: 128 mmol/L — ABNORMAL LOW (ref 135–145)

## 2020-05-03 LAB — MAGNESIUM: Magnesium: 2.4 mg/dL (ref 1.7–2.4)

## 2020-05-03 LAB — CULTURE, BLOOD (ROUTINE X 2): Special Requests: ADEQUATE

## 2020-05-03 LAB — ECHOCARDIOGRAM COMPLETE
Area-P 1/2: 3.42 cm2
Height: 67 in
S' Lateral: 3.42 cm
Weight: 1992.96 oz

## 2020-05-03 MED ORDER — LISINOPRIL-HYDROCHLOROTHIAZIDE 20-25 MG PO TABS: 1.0000 | ORAL_TABLET | Freq: Every day | ORAL | Status: DC

## 2020-05-03 MED ORDER — AMOXICILLIN 500 MG PO CAPS: 500.0000 mg | ORAL_CAPSULE | Freq: Three times a day (TID) | ORAL | 0 refills | Status: AC

## 2020-05-03 MED ORDER — POLYETHYLENE GLYCOL 3350 17 G PO PACK: 17.0000 g | PACK | Freq: Every day | ORAL | 0 refills | Status: DC | PRN

## 2020-05-03 MED ORDER — AMOXICILLIN 500 MG PO CAPS: 500.0000 mg | ORAL_CAPSULE | Freq: Three times a day (TID) | ORAL | Status: DC

## 2020-05-03 MED ORDER — SODIUM CHLORIDE 0.9 % IV SOLN: INTRAVENOUS | Status: DC

## 2020-05-03 NOTE — Plan of Care (Signed)

## 2020-05-03 NOTE — Discharge Summary (Signed)
Physician Discharge Summary   Patient ID: Andre Casey. MRN: [id number] DOB/AGE: 03/23/1929 87 y.o.  Admit date: 04/30/2020 Discharge date: 05/03/2020  Primary Care Physician:  [name], MD   Recommendations for Outpatient Follow-up:  1. Follow up with PCP in 1-2 weeks 2. Patient to continue amoxicillin 500 mg 3 times daily for 3 more days 3. Patient recommended to follow-up with urology in 1 week for voiding trial 4. Patient has chronic hemorrhoids, FOBT positive however no gross bleeding.  Recommended follow-up with GI for nonurgent flex sig or colonoscopy as needed.  Home Health: Home health PT Equipment/Devices: None  Discharge Condition: stable CODE STATUS: DNR Diet recommendation: Heart healthy diet   Discharge Diagnoses:     Acute urinary retention secondary to urethral stricture, status post Foley catheterization  E. coli UTI   E. coli bacteremia   Asymptomatic nonsustained SVT  Mild cognitive impairment  Malignant neoplasm of prostate (HCC)  Essential hypertension  Dyslipidemia, goal LDL below 70  acute blood loss anemia   LBBB (left bundle branch block), chronic Chronic hemorrhoids  Consults: Urology Infectious disease     Allergies:   Allergies  Allergen Reactions   Donepezil Nausea Only    Report upset stomach - unable to tolerate.   Namzaric [Memantine Hcl-Donepezil Hcl] Other (See Comments)    Stomach upset - pt would like to try lower doses of medications in separate prescriptions (note in Epic).     DISCHARGE MEDICATIONS: Allergies as of 05/03/2020      Reactions   Donepezil Nausea Only   Report upset stomach - unable to tolerate.   Namzaric [memantine Hcl-donepezil Hcl] Other (See Comments)   Stomach upset - pt would like to try lower doses of medications in separate prescriptions (note in Epic).      Medication List    STOP taking these medications   aspirin 81 MG tablet     TAKE these medications    acetaminophen 500 MG tablet Commonly known as: TYLENOL Take 1,000 mg by mouth every 8 (eight) hours as needed for mild pain or headache.   amLODipine 5 MG tablet Commonly known as: NORVASC TAKE 1 TABLET BY MOUTH ONCE DAILY   amoxicillin 500 MG capsule Commonly known as: AMOXIL Take 1 capsule (500 mg total) by mouth 3 (three) times daily for 3 days.   atorvastatin 40 MG tablet Commonly known as: LIPITOR Take 1 tablet (40 mg total) by mouth daily at 6 PM. What changed: when to take this   buPROPion 150 MG 24 hr tablet Commonly known as: WELLBUTRIN XL Take 150 mg by mouth daily.   carvedilol 6.25 MG tablet Commonly known as: COREG Take 1 tablet (6.25 mg total) by mouth 2 (two) times daily with a meal.   lisinopril-hydrochlorothiazide 20-25 MG tablet Commonly known as: ZESTORETIC Take 1 tablet by mouth daily. Please hold until follow-up with your doctor What changed: additional instructions   memantine 10 MG tablet Commonly known as: Namenda Take 1 tablet (10 mg total) by mouth 2 (two) times daily.   multivitamin with minerals tablet Take 1 tablet by mouth daily.   polyethylene glycol 17 g packet Commonly known as: MIRALAX / GLYCOLAX Take 17 g by mouth daily as needed for mild constipation (Also available over-the-counter).   traMADol 50 MG tablet Commonly known as: ULTRAM Take 1 tablet (50 mg total) by mouth every 6 (six) hours as needed. What changed: reasons to take this        Brief H  and P: For complete details please refer to admission H and P, but in brief * Andre Caseyis a 87 y.o.male with a history of hypertension, dementia, hyperlipidemia, CAD, prostate cancer s/p surgery and radiation. Patient presented secondary to urinary retention and hematuria with concern for UTI and found to have E. Coli bacteremia.  Hospital Course:  Acute urinary retention -Secondary to known urethral stricture. -Urology was consulted, recommended to keep Foley  in and follow-up with outpatient urology, Dr. Rosana Hoes -Continue Foley catheter   Gross hematuria -In the setting of prostate cancer with radiation therapy, recurrent issue, complicated by UTI and urethral stricture -Hematuria resolved, H&H currently stable, aspirin on hold  E. coli bacteremia, E. coli UTI -Blood cultures positive for E. Coli.  Urine culture positive for E. coli, -Infectious disease was consulted, cultures returned pansensitive, patient was discharged on amoxicillin for 3 more days to complete full course for 7 days  Essential hypertension -BP is currently stable, continue amlodipine, Coreg -Hold lisinopril HCTZ until follow-up with PCP  Hyperlipidemia -Continue Lipitor  Malignant neoplasm of prostate status post radiation -Patient follows with urology, Dr. Rosana Ho[facility] at [facility]  Asymptomatic SVT -On 3/10 overnight and early morning, patient had asymptomatic and brief nonsustained episode of SVT, EKG showed old LBBB Electrolytes normal.  2D echo showed normal EF 55 to 60%    Mild cognitive impairment -Continue Namenda  Acute blood loss anemia -In the setting of hematuria, FOBT also positive, has history of hemorrhoids. -No gross GI bleeding.  Family unsure of last colonoscopy but showed that he had in the past -H&H currently stable, outpatient follow-up with PCP and GI  Underweight Estimated body mass index is 19.51 kg/m as calculated from the following:   Height as of this encounter: 5\' 7"  (1.702 m).   Weight as of this encounter: 56.5 kg.  Continue nutritional supplements   Day of Discharge S: No acute complaints, wants to go home, son at the bedside.  No fevers or chills, no acute issues overnight  BP 125/72 (BP Location: Right Arm)    Pulse 66    Temp 100.2 F (37.9 C) (Oral)    Resp 19    Ht 5\' 7"  (1.702 m)    Wt 56.5 kg    SpO2 99%    BMI 19.51 kg/m   Physical Exam: General: Alert and awake oriented x3 not in any acute distress. CVS:  S1-S2 clear no murmur rubs or gallops Chest: clear to auscultation bilaterally, no wheezing rales or rhonchi Abdomen: soft nontender, nondistended, normal bowel sounds Extremities: no cyanosis, clubbing or edema noted bilaterally Neuro: no new deficits    Get Medicines reviewed and adjusted: Please take all your medications with you for your next visit with your Primary MD  Please request your Primary MD to go over all hospital tests and procedure/radiological results at the follow up. Please ask your Primary MD to get all Hospital records sent to his/her office.  If you experience worsening of your admission symptoms, develop shortness of breath, life threatening emergency, suicidal or homicidal thoughts you must seek medical attention immediately by calling 911 or calling your MD immediately  if symptoms less severe.  You must read complete instructions/literature along with all the possible adverse reactions/side effects for all the Medicines you take and that have been prescribed to you. Take any new Medicines after you have completely understood and accept all the possible adverse reactions/side effects.   Do not drive when taking pain medications.  Do not take more than prescribed Pain, Sleep and Anxiety Medications  Special Instructions: If you have smoked or chewed Tobacco  in the last 2 yrs please stop smoking, stop any regular Alcohol  and or any Recreational drug use.  Wear Seat belts while driving.  Please note  You were cared for by a hospitalist during your hospital stay. Once you are discharged, your primary care physician will handle any further medical issues. Please note that NO REFILLS for any discharge medications will be authorized once you are discharged, as it is imperative that you return to your primary care physician (or establish a relationship with a primary care physician if you do not have one) for your aftercare needs so that they can reassess your need for  medications and monitor your lab values.   The results of significant diagnostics from this hospitalization (including imaging, microbiology, ancillary and laboratory) are listed below for reference.      Procedures/Studies:  CT ABDOMEN PELVIS W CONTRAST  Result Date: 04/30/2020 CLINICAL DATA:  Abdominal pain and fever. Patient reports catheter blockage causing urinary retention. Rectal bleeding. History of prostate cancer. EXAM: CT ABDOMEN AND PELVIS WITH CONTRAST TECHNIQUE: Multidetector CT imaging of the abdomen and pelvis was performed using the standard protocol following bolus administration of intravenous contrast. CONTRAST:  156mL OMNIPAQUE IOHEXOL 300 MG/ML  SOLN COMPARISON:  Noncontrast abdominal CT 01/27/2019, abdominal MRI 02/15/2019 FINDINGS: Lower chest: Subsegmental atelectasis in the left lower lobe. No acute airspace disease or pleural effusion. Normal heart size. Hepatobiliary: No focal liver abnormality is seen. No gallstones, gallbladder wall thickening, or biliary dilatation. Pancreas: There is a 16 mm low-density lesion in the pancreatic head, series 2, image 24. No associated ductal dilatation. No peripancreatic fat stranding. Spleen: Normal in size without focal abnormality. Adrenals/Urinary Tract: No adrenal nodule. Enhancing mass in the periphery of the right mid kidney measures 2.5 x 1.4 cm. This is grossly unchanged in size from prior noncontrast CT. Additional small hypodensities in the right kidney, largest measuring 11 mm, too small to characterize but likely cysts. Tiny cortical low densities in the left kidney, also likely cysts and too small to characterize. There is no hydronephrosis. No perinephric edema. Symmetric excretion on delayed phase imaging. Foley catheter within the urinary bladder. Circumferential bladder wall thickening with mild perivesicular fat stranding. Stomach/Bowel: Tiny hiatal hernia. Stomach otherwise unremarkable. No small bowel obstruction or  inflammatory change. Normal appendix. Moderate colonic stool burden. Prominent sigmoid colonic diverticulosis. No diverticulitis or acute colonic inflammation. Vascular/Lymphatic: Aortic atherosclerosis and tortuosity. Patent portal vein. Prior pelvic lymph node dissection, surgical clips create regional streak artifact. No enlarged lymph nodes. Reproductive: Prostatectomy. Penile prosthesis with reservoir in the right anterior pelvis. Other: No ascites or free air.  No focal fluid collection. Musculoskeletal: Scoliosis and diffuse degenerative change in the spine. There are no acute or suspicious osseous abnormalities. IMPRESSION: 1. Circumferential bladder wall thickening with mild perivesicular fat stranding, suspicious for cystitis. Foley catheter in the urinary bladder. 2. Enhancing mass in the periphery of the right mid kidney measuring 2.5 x 1.4 cm, grossly unchanged in size from prior imaging allowing for differences in modality. This remains suspicious for renal cell carcinoma. 3. Low-density lesion in the pancreatic head measuring 16 mm. This is unchanged in size from prior MRI. 4. Colonic diverticulosis without diverticulitis. Aortic Atherosclerosis (ICD10-I70.0). Electronically Signed   By: Keith Rake M.D.   On: 04/30/2020 18:34   DG Chest Portable 1 View  Result Date: 04/30/2020 CLINICAL DATA:  Fever. EXAM: PORTABLE CHEST 1 VIEW COMPARISON:  03/22/2019 FINDINGS: 1603 hours. The lungs are clear without focal pneumonia, edema, pneumothorax or pleural effusion. The cardiopericardial silhouette is within normal limits for size. Right shoulder replacement. Telemetry leads overlie the chest. IMPRESSION: No acute findings. Electronically Signed   By: Misty Stanley M.D.   On: 04/30/2020 16:14   ECHOCARDIOGRAM COMPLETE  Result Date: 05/03/2020    ECHOCARDIOGRAM REPORT   Patient Name:   Jamarcus Laduke. Date of Exam: 05/03/2020 Medical Rec #:  [id number]                Height:       67.0 in  Accession #:    [id number]               Weight:       124.6 lb Date of Birth:  10-13-1929                BSA:          1.654 m Patient Age:    28 years                 BP:           150/72 mmHg Patient Gender: M                        HR:           67 bpm. Exam Location:  Inpatient Procedure: 2D Echo, Cardiac Doppler and Color Doppler Indications:    R94.31 Abnormal EKG  History:        Patient has prior history of Echocardiogram examinations, most                 recent 01/15/2016. CAD; Risk Factors:Hypertension and                 Dyslipidemia. Cancer.  Sonographer:    Jonelle Sidle Dance Referring Phys: [id number] Enrico K Taeveon Keesling IMPRESSIONS  1. Left ventricular ejection fraction, by estimation, is 55 to 60%. The left ventricle has normal function. The left ventricle has no regional wall motion abnormalities. Left ventricular diastolic parameters were normal.  2. Right ventricular systolic function is normal. The right ventricular size is normal. There is normal pulmonary artery systolic pressure.  3. The mitral valve is degenerative. Trivial mitral valve regurgitation.  4. The aortic valve is tricuspid. There is mild calcification of the aortic valve. There is mild thickening of the aortic valve. Aortic valve regurgitation is not visualized. Mild aortic valve sclerosis is present, with no evidence of aortic valve stenosis.  5. The inferior vena cava is dilated in size with >50% respiratory variability, suggesting right atrial pressure of 8 mmHg. Comparison(s): No significant change from prior study. Conclusion(s)/Recommendation(s): Normal biventricular function without evidence of hemodynamically significant valvular heart disease. FINDINGS  Left Ventricle: Left ventricular ejection fraction, by estimation, is 55 to 60%. The left ventricle has normal function. The left ventricle has no regional wall motion abnormalities. The left ventricular internal cavity size was normal in size. There is  no left ventricular hypertrophy.  Abnormal (paradoxical) septal motion, consistent with left bundle branch block. Left ventricular diastolic parameters were normal. Right Ventricle: The right ventricular size is normal. No increase in right ventricular wall thickness. Right ventricular systolic function is normal. There is normal pulmonary artery systolic pressure. The tricuspid regurgitant velocity is 2.20 m/s, and  with an assumed right atrial pressure of 8 mmHg, the estimated  right ventricular systolic pressure is 46.2 mmHg. Left Atrium: Left atrial size was normal in size. Right Atrium: Right atrial size was normal in size. Pericardium: Trivial pericardial effusion is present. Mitral Valve: The mitral valve is degenerative in appearance. There is mild thickening of the mitral valve leaflet(s). There is mild calcification of the mitral valve leaflet(s). Mild mitral annular calcification. Trivial mitral valve regurgitation. Tricuspid Valve: The tricuspid valve is normal in structure. Tricuspid valve regurgitation is trivial. No evidence of tricuspid stenosis. Aortic Valve: The aortic valve is tricuspid. There is mild calcification of the aortic valve. There is mild thickening of the aortic valve. Aortic valve regurgitation is not visualized. Mild aortic valve sclerosis is present, with no evidence of aortic valve stenosis. Pulmonic Valve: The pulmonic valve was not well visualized. Pulmonic valve regurgitation is not visualized. Aorta: The aortic root and ascending aorta are structurally normal, with no evidence of dilitation. Venous: The inferior vena cava is dilated in size with greater than 50% respiratory variability, suggesting right atrial pressure of 8 mmHg. IAS/Shunts: The atrial septum is grossly normal.  LEFT VENTRICLE PLAX 2D LVIDd:         4.68 cm  Diastology LVIDs:         3.42 cm  LV e' medial:    5.59 cm/s LV PW:         0.82 cm  LV E/e' medial:  12.7 LV IVS:        1.07 cm  LV e' lateral:   9.57 cm/s LVOT diam:     2.00 cm  LV E/e'  lateral: 7.4 LV SV:         61 LV SV Index:   37 LVOT Area:     3.14 cm  RIGHT VENTRICLE             IVC RV Basal diam:  3.15 cm     IVC diam: 2.17 cm RV Mid diam:    1.71 cm RV S prime:     13.40 cm/s TAPSE (M-mode): 2.1 cm LEFT ATRIUM         Index LA diam:    3.70 cm 2.24 cm/m  AORTIC VALVE LVOT Vmax:   107.00 cm/s LVOT Vmean:  72.900 cm/s LVOT VTI:    0.194 m  AORTA Ao Root diam: 3.40 cm Ao Asc diam:  3.10 cm MITRAL VALVE                TRICUSPID VALVE MV Area (PHT): 3.42 cm     TR Peak grad:   19.4 mmHg MV Decel Time: 222 msec     TR Vmax:        220.00 cm/s MV E velocity: 71.00 cm/s MV A velocity: 103.00 cm/s  SHUNTS MV E/A ratio:  0.69         Systemic VTI:  0.19 m                             Systemic Diam: 2.00 cm Buford Dresser MD Electronically signed by Buford Dresser MD Signature Date/Time: 05/03/2020/1:33:22 PM    Final        LAB RESULTS: Basic Metabolic Panel: Recent Labs  Lab 05/01/20 0350 05/02/20 0334 05/03/20 0353  NA 131* 133* 128*  K 3.6 3.7 3.6  CL 101 102 98  CO2 21* 22 21*  GLUCOSE 116* 122* 109*  BUN 21 24* 22  CREATININE 1.12 1.17 1.10  CALCIUM 8.1* 8.1*  7.8*  MG 2.1  --  2.4  PHOS 2.5  --   --    Liver Function Tests: Recent Labs  Lab 05/01/20 0350  AST 18  ALT 16  ALKPHOS 52  BILITOT 0.9  PROT 5.8*  ALBUMIN 3.1*   No results for input(s): LIPASE, AMYLASE in the last 168 hours. No results for input(s): AMMONIA in the last 168 hours. CBC: Recent Labs  Lab 05/01/20 0350 05/02/20 0334 05/03/20 0353  WBC 8.0 6.9 7.2  NEUTROABS 6.1  --   --   HGB 10.7* 10.4* 9.7*  HCT 33.6* 32.4* 30.1*  MCV 92.8 93.9 91.8  PLT 222 224 238   Cardiac Enzymes: Recent Labs  Lab 04/30/20 1942  CKTOTAL 188   BNP: Invalid input(s): POCBNP CBG: No results for input(s): GLUCAP in the last 168 hours.     Disposition and Follow-up: Discharge Instructions    Diet - low sodium heart healthy   Complete by: As directed    Discharge  instructions   Complete by: As directed    Please keep the Foley catheter until follow-up with your urologist, Dr. Rosana Hoes for voiding trial.   Increase activity slowly   Complete by: As directed        DISPOSITION: Home with home health   [place]    [name], MD. Schedule an appointment as soon as possible for a visit in 1 week(s).   Specialty: Urology Why: For hospital follow-up and voiding trial Contact information: [place] [address] [phone]        [name], MD. Schedule an appointment as soon as possible for a visit in 2 week(s).   Specialty: Family Medicine Contact information: [address] [place] [address] [phone]        Lelon Perla, MD .   Specialty: Cardiology Contact information: [address] [place] [address] [phone]                Time coordinating discharge:  35 minutes  Signed:   Estill Cotta M.D. [facility] 05/03/2020, 2:21 PM

## 2020-05-03 NOTE — Progress Notes (Signed)
  Echocardiogram 2D Echocardiogram has been performed.  Andre Casey 05/03/2020, 11:57 AM

## 2020-05-03 NOTE — TOC Transition Note (Signed)
Transition of Care [facility]) - CM/SW Discharge Note   Patient Details  Name: Andre Casey. MRN: [id number] Date of Birth: 25-Apr-1929  Transition of Care [facility]) CM/SW Contact:  Ross Ludwig, LCSW Phone Number: 05/03/2020, 2:48 PM   Clinical Narrative:     CSW spoke to patient and he stated he only felt like he needed [facility].  Patient stated he did not need any equipment, CSW asked if he has never had home health and he said no.  CSW provided a list of agencies and patient stated he did not have a preference.  CSW contacted [facility] and they said they can accept patient.   Final next level of care: [place] Barriers to Discharge: Barriers Resolved   Patient Goals and CMS Choice Patient states their goals for this hospitalization and ongoing recovery are:: To return back home with home health services CMS Medicare.gov Compare Post Acute Care list provided to:: Patient Choice offered to / list presented to : Patient  Discharge Placement                       Discharge Plan and Services                          HH Arranged: PT [facility] Agency: [place] Date [place]: 05/03/20 Time Agenda: [id number] Representative spoke with at [place]: [place] ([place]) Interventions     Readmission Risk Interventions No flowsheet data found.

## 2020-05-03 NOTE — Progress Notes (Signed)
    [place] for Infectious Disease   Reason for visit: Follow up on E coli positive blood culture  Interval History: no acute events; pansensitive E coli Day 4 ceftriaxone  Physical Exam: Constitutional:  Vitals:   05/02/20 2026 05/03/20 0547  BP: (!) 107/54 (!) 150/72  Pulse: (!) 55 69  Resp: 19 15  Temp: 98.5 F (36.9 C) 98.4 F (36.9 C)  SpO2: 97% 97%   patient appears in NAD  Impression: E coli positive blood culture.  Pansensitive and can use oral amoxicillin 500 mg three times a day through 3/13 and stop.     Plan: 1. Oral amoxicillin Follow up as needed.

## 2020-05-04 LAB — URINE CULTURE: Culture: >=100000 — AB

## 2020-05-05 LAB — CULTURE, BLOOD (ROUTINE X 2)
Culture: NO GROWTH
Special Requests: ADEQUATE

## 2020-05-07 DIAGNOSIS — Z466 Encounter for fitting and adjustment of urinary device: Secondary | ICD-10-CM | POA: Diagnosis not present

## 2020-05-07 DIAGNOSIS — R339 Retention of urine, unspecified: Secondary | ICD-10-CM | POA: Diagnosis not present

## 2020-05-09 ENCOUNTER — Telehealth: Payer: Self-pay | Admitting: Neurology

## 2020-05-09 NOTE — Telephone Encounter (Signed)
[name] son, Lenord Carbo ( not on DPR) LVM, called, son, Mr. Thad Ranger. Has a question if a recommendation about his ability to drive is in patient['s chart. Would like a call from the nurse.  Informed Mr. Whitsell can not discuss your father's medical information due to not being on DPR.

## 2020-05-09 NOTE — Telephone Encounter (Signed)
His son did attend his last appt. Unfortunately, he is not on the patient's DPR. I called his stepdaughter, Doyle Askew (on DPR) and spoke with her about this call. She feels the driving concerns are more about his reaction time. States the patient typically drives less than one mile from his home (to grocery store or pharmacy) and only goes during the day. However, their neighborhood [place] road. They may decide to call the DMV to request a driver's re-examination test. She is going to discuss with with her stepbrother and make a decision as a family. While his son is in town from [name], she will have them stop by one of his [facility] physician offices to update his DPR.

## 2020-05-11 DIAGNOSIS — R7881 Bacteremia: Secondary | ICD-10-CM | POA: Diagnosis not present

## 2020-05-11 DIAGNOSIS — I119 Hypertensive heart disease without heart failure: Secondary | ICD-10-CM | POA: Diagnosis not present

## 2020-05-11 DIAGNOSIS — B9620 Unspecified Escherichia coli [E. coli] as the cause of diseases classified elsewhere: Secondary | ICD-10-CM | POA: Diagnosis not present

## 2020-05-11 DIAGNOSIS — H9190 Unspecified hearing loss, unspecified ear: Secondary | ICD-10-CM | POA: Diagnosis not present

## 2020-05-11 DIAGNOSIS — R339 Retention of urine, unspecified: Secondary | ICD-10-CM | POA: Diagnosis not present

## 2020-05-11 DIAGNOSIS — I2510 Atherosclerotic heart disease of native coronary artery without angina pectoris: Secondary | ICD-10-CM | POA: Diagnosis not present

## 2020-05-11 DIAGNOSIS — E46 Unspecified protein-calorie malnutrition: Secondary | ICD-10-CM | POA: Diagnosis not present

## 2020-05-11 DIAGNOSIS — D62 Acute posthemorrhagic anemia: Secondary | ICD-10-CM | POA: Diagnosis not present

## 2020-05-11 DIAGNOSIS — R69 Illness, unspecified: Secondary | ICD-10-CM | POA: Diagnosis not present

## 2020-05-11 DIAGNOSIS — R319 Hematuria, unspecified: Secondary | ICD-10-CM | POA: Diagnosis not present

## 2020-05-11 DIAGNOSIS — M542 Cervicalgia: Secondary | ICD-10-CM | POA: Diagnosis not present

## 2020-05-28 DIAGNOSIS — N179 Acute kidney failure, unspecified: Secondary | ICD-10-CM | POA: Diagnosis not present

## 2020-06-06 DIAGNOSIS — N2889 Other specified disorders of kidney and ureter: Secondary | ICD-10-CM | POA: Diagnosis not present

## 2020-06-07 DIAGNOSIS — M47812 Spondylosis without myelopathy or radiculopathy, cervical region: Secondary | ICD-10-CM | POA: Diagnosis not present

## 2020-06-14 DIAGNOSIS — R972 Elevated prostate specific antigen [PSA]: Secondary | ICD-10-CM | POA: Diagnosis not present

## 2020-06-14 DIAGNOSIS — C649 Malignant neoplasm of unspecified kidney, except renal pelvis: Secondary | ICD-10-CM | POA: Diagnosis not present

## 2020-06-14 DIAGNOSIS — N35014 Post-traumatic urethral stricture, male, unspecified: Secondary | ICD-10-CM | POA: Diagnosis not present

## 2020-06-14 DIAGNOSIS — C61 Malignant neoplasm of prostate: Secondary | ICD-10-CM | POA: Diagnosis not present

## 2020-06-21 DIAGNOSIS — H353231 Exudative age-related macular degeneration, bilateral, with active choroidal neovascularization: Secondary | ICD-10-CM | POA: Diagnosis not present

## 2020-06-21 DIAGNOSIS — H35423 Microcystoid degeneration of retina, bilateral: Secondary | ICD-10-CM | POA: Diagnosis not present

## 2020-06-21 DIAGNOSIS — H43813 Vitreous degeneration, bilateral: Secondary | ICD-10-CM | POA: Diagnosis not present

## 2020-06-21 DIAGNOSIS — H35372 Puckering of macula, left eye: Secondary | ICD-10-CM | POA: Diagnosis not present

## 2020-06-22 DIAGNOSIS — N179 Acute kidney failure, unspecified: Secondary | ICD-10-CM | POA: Diagnosis not present

## 2020-06-22 DIAGNOSIS — E46 Unspecified protein-calorie malnutrition: Secondary | ICD-10-CM | POA: Diagnosis not present

## 2020-06-22 DIAGNOSIS — D500 Iron deficiency anemia secondary to blood loss (chronic): Secondary | ICD-10-CM | POA: Diagnosis not present

## 2020-06-22 DIAGNOSIS — R32 Unspecified urinary incontinence: Secondary | ICD-10-CM | POA: Diagnosis not present

## 2020-06-22 DIAGNOSIS — R339 Retention of urine, unspecified: Secondary | ICD-10-CM | POA: Diagnosis not present

## 2020-06-22 DIAGNOSIS — K627 Radiation proctitis: Secondary | ICD-10-CM | POA: Diagnosis not present

## 2020-06-22 DIAGNOSIS — R69 Illness, unspecified: Secondary | ICD-10-CM | POA: Diagnosis not present

## 2020-06-22 DIAGNOSIS — I119 Hypertensive heart disease without heart failure: Secondary | ICD-10-CM | POA: Diagnosis not present

## 2020-06-25 DIAGNOSIS — M5412 Radiculopathy, cervical region: Secondary | ICD-10-CM | POA: Diagnosis not present

## 2020-07-17 DIAGNOSIS — M542 Cervicalgia: Secondary | ICD-10-CM | POA: Diagnosis not present

## 2020-07-17 DIAGNOSIS — M5412 Radiculopathy, cervical region: Secondary | ICD-10-CM | POA: Diagnosis not present
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
## 2020-08-03 ENCOUNTER — Ambulatory Visit: Payer: Medicare HMO | Admitting: Podiatry

## 2020-08-07 DIAGNOSIS — R52 Pain, unspecified: Secondary | ICD-10-CM | POA: Diagnosis not present

## 2020-08-14 ENCOUNTER — Ambulatory Visit (INDEPENDENT_AMBULATORY_CARE_PROVIDER_SITE_OTHER): Payer: Medicare HMO | Admitting: Cardiology

## 2020-08-14 ENCOUNTER — Encounter: Payer: Self-pay | Admitting: Cardiology

## 2020-08-14 ENCOUNTER — Other Ambulatory Visit: Payer: Self-pay

## 2020-08-14 VITALS — BP 144/88 | HR 80 | Ht 67.0 in | Wt 129.8 lb

## 2020-08-14 DIAGNOSIS — Z9861 Coronary angioplasty status: Secondary | ICD-10-CM | POA: Diagnosis not present

## 2020-08-14 DIAGNOSIS — I10 Essential (primary) hypertension: Secondary | ICD-10-CM | POA: Diagnosis not present

## 2020-08-14 DIAGNOSIS — I2510 Atherosclerotic heart disease of native coronary artery without angina pectoris: Secondary | ICD-10-CM | POA: Diagnosis not present

## 2020-08-14 DIAGNOSIS — E785 Hyperlipidemia, unspecified: Secondary | ICD-10-CM

## 2020-08-14 MED ORDER — ASPIRIN EC 81 MG PO TBEC: 81.0000 mg | DELAYED_RELEASE_TABLET | Freq: Every day | ORAL | 3 refills | Status: DC

## 2020-08-14 NOTE — Patient Instructions (Signed)
Medication Instructions:   START ASPIRIN 81 MG ONCE DAILY  *If you need a refill on your cardiac medications before your next appointment, please call your pharmacy*   Follow-Up: At [facility], you and your health needs are our priority.  As part of our continuing mission to provide you with exceptional heart care, we have created designated Provider Care Teams.  These Care Teams include your primary Cardiologist (physician) and Advanced Practice Providers (APPs -  Physician Assistants and Nurse Practitioners) who all work together to provide you with the care you need, when you need it.  We recommend signing up for the patient portal called "MyChart".  Sign up information is provided on this After Visit Summary.  MyChart is used to connect with patients for Virtual Visits (Telemedicine).  Patients are able to view lab/test results, encounter notes, upcoming appointments, etc.  Non-urgent messages can be sent to your provider as well.   To learn more about what you can do with MyChart, go to [email or web address].    Your next appointment:   12 month(s)  The format for your next appointment:   In Person  Provider:   Brian Crenshaw, MD   

## 2020-08-15 DIAGNOSIS — R197 Diarrhea, unspecified: Secondary | ICD-10-CM | POA: Diagnosis not present

## 2020-08-15 DIAGNOSIS — R69 Illness, unspecified: Secondary | ICD-10-CM | POA: Diagnosis not present

## 2020-08-15 DIAGNOSIS — N2889 Other specified disorders of kidney and ureter: Secondary | ICD-10-CM | POA: Diagnosis not present

## 2020-08-30 DIAGNOSIS — H353231 Exudative age-related macular degeneration, bilateral, with active choroidal neovascularization: Secondary | ICD-10-CM | POA: Diagnosis not present

## 2020-08-30 DIAGNOSIS — H35423 Microcystoid degeneration of retina, bilateral: Secondary | ICD-10-CM | POA: Diagnosis not present

## 2020-08-30 DIAGNOSIS — H35372 Puckering of macula, left eye: Secondary | ICD-10-CM | POA: Diagnosis not present

## 2020-08-30 DIAGNOSIS — H43813 Vitreous degeneration, bilateral: Secondary | ICD-10-CM | POA: Diagnosis not present

## 2020-09-11 ENCOUNTER — Ambulatory Visit: Payer: Medicare HMO | Admitting: Podiatry

## 2020-10-03 ENCOUNTER — Ambulatory Visit: Payer: Medicare HMO | Admitting: Neurology

## 2020-10-18 DIAGNOSIS — R69 Illness, unspecified: Secondary | ICD-10-CM | POA: Diagnosis not present

## 2020-10-18 DIAGNOSIS — F411 Generalized anxiety disorder: Secondary | ICD-10-CM | POA: Diagnosis not present

## 2020-10-18 DIAGNOSIS — R197 Diarrhea, unspecified: Secondary | ICD-10-CM | POA: Diagnosis not present

## 2020-10-24 ENCOUNTER — Ambulatory Visit: Payer: Medicare HMO | Admitting: Podiatry

## 2020-11-08 DIAGNOSIS — H353231 Exudative age-related macular degeneration, bilateral, with active choroidal neovascularization: Secondary | ICD-10-CM | POA: Diagnosis not present

## 2020-11-08 DIAGNOSIS — H35372 Puckering of macula, left eye: Secondary | ICD-10-CM | POA: Diagnosis not present

## 2020-11-16 DIAGNOSIS — N3000 Acute cystitis without hematuria: Secondary | ICD-10-CM | POA: Diagnosis not present

## 2020-11-19 ENCOUNTER — Ambulatory Visit: Payer: Medicare HMO | Admitting: Podiatry

## 2021-01-22 ENCOUNTER — Ambulatory Visit: Payer: Medicare HMO | Admitting: Neurology

## 2021-01-28 ENCOUNTER — Encounter: Payer: Self-pay | Admitting: Neurology

## 2021-01-28 ENCOUNTER — Ambulatory Visit: Payer: Medicare HMO | Admitting: Neurology

## 2021-03-11 ENCOUNTER — Encounter (HOSPITAL_COMMUNITY): Payer: Self-pay

## 2021-03-11 ENCOUNTER — Other Ambulatory Visit: Payer: Self-pay

## 2021-03-11 ENCOUNTER — Inpatient Hospital Stay (HOSPITAL_COMMUNITY)
Admission: EM | Admit: 2021-03-11 | Discharge: 2021-03-13 | DRG: 697 | Disposition: A | Discharge status: Home / Self Care | Payer: Medicare HMO | Attending: Internal Medicine | Admitting: Internal Medicine

## 2021-03-11 DIAGNOSIS — Z79899 Other long term (current) drug therapy: Secondary | ICD-10-CM

## 2021-03-11 DIAGNOSIS — N179 Acute kidney failure, unspecified: Secondary | ICD-10-CM | POA: Diagnosis present

## 2021-03-11 DIAGNOSIS — N320 Bladder-neck obstruction: Secondary | ICD-10-CM | POA: Diagnosis present

## 2021-03-11 DIAGNOSIS — I10 Essential (primary) hypertension: Secondary | ICD-10-CM | POA: Diagnosis present

## 2021-03-11 DIAGNOSIS — R310 Gross hematuria: Secondary | ICD-10-CM | POA: Diagnosis not present

## 2021-03-11 DIAGNOSIS — C61 Malignant neoplasm of prostate: Secondary | ICD-10-CM | POA: Diagnosis present

## 2021-03-11 DIAGNOSIS — N2889 Other specified disorders of kidney and ureter: Secondary | ICD-10-CM | POA: Diagnosis present

## 2021-03-11 DIAGNOSIS — I160 Hypertensive urgency: Secondary | ICD-10-CM | POA: Diagnosis not present

## 2021-03-11 DIAGNOSIS — Z85828 Personal history of other malignant neoplasm of skin: Secondary | ICD-10-CM

## 2021-03-11 DIAGNOSIS — E7800 Pure hypercholesterolemia, unspecified: Secondary | ICD-10-CM | POA: Diagnosis present

## 2021-03-11 DIAGNOSIS — C772 Secondary and unspecified malignant neoplasm of intra-abdominal lymph nodes: Secondary | ICD-10-CM | POA: Diagnosis present

## 2021-03-11 DIAGNOSIS — Z9079 Acquired absence of other genital organ(s): Secondary | ICD-10-CM

## 2021-03-11 DIAGNOSIS — R339 Retention of urine, unspecified: Secondary | ICD-10-CM | POA: Diagnosis present

## 2021-03-11 DIAGNOSIS — N390 Urinary tract infection, site not specified: Secondary | ICD-10-CM | POA: Diagnosis present

## 2021-03-11 DIAGNOSIS — Z955 Presence of coronary angioplasty implant and graft: Secondary | ICD-10-CM

## 2021-03-11 DIAGNOSIS — I2510 Atherosclerotic heart disease of native coronary artery without angina pectoris: Secondary | ICD-10-CM | POA: Diagnosis present

## 2021-03-11 DIAGNOSIS — I25119 Atherosclerotic heart disease of native coronary artery with unspecified angina pectoris: Secondary | ICD-10-CM | POA: Diagnosis present

## 2021-03-11 DIAGNOSIS — Z87440 Personal history of urinary (tract) infections: Secondary | ICD-10-CM

## 2021-03-11 DIAGNOSIS — Z923 Personal history of irradiation: Secondary | ICD-10-CM

## 2021-03-11 DIAGNOSIS — C774 Secondary and unspecified malignant neoplasm of inguinal and lower limb lymph nodes: Secondary | ICD-10-CM | POA: Diagnosis present

## 2021-03-11 DIAGNOSIS — Z96611 Presence of right artificial shoulder joint: Secondary | ICD-10-CM | POA: Diagnosis present

## 2021-03-11 DIAGNOSIS — Z66 Do not resuscitate: Secondary | ICD-10-CM | POA: Diagnosis present

## 2021-03-11 DIAGNOSIS — N35919 Unspecified urethral stricture, male, unspecified site: Secondary | ICD-10-CM | POA: Diagnosis not present

## 2021-03-11 DIAGNOSIS — M47816 Spondylosis without myelopathy or radiculopathy, lumbar region: Secondary | ICD-10-CM | POA: Diagnosis present

## 2021-03-11 DIAGNOSIS — G3184 Mild cognitive impairment, so stated: Secondary | ICD-10-CM | POA: Diagnosis present

## 2021-03-11 DIAGNOSIS — N289 Disorder of kidney and ureter, unspecified: Secondary | ICD-10-CM

## 2021-03-11 DIAGNOSIS — Z888 Allergy status to other drugs, medicaments and biological substances status: Secondary | ICD-10-CM

## 2021-03-11 DIAGNOSIS — Z7982 Long term (current) use of aspirin: Secondary | ICD-10-CM

## 2021-03-11 DIAGNOSIS — Z8249 Family history of ischemic heart disease and other diseases of the circulatory system: Secondary | ICD-10-CM

## 2021-03-11 DIAGNOSIS — Z87891 Personal history of nicotine dependence: Secondary | ICD-10-CM

## 2021-03-11 LAB — CBC WITH DIFFERENTIAL/PLATELET
Abs Immature Granulocytes: 0.08 10*3/uL — ABNORMAL HIGH (ref 0.00–0.07)
Basophils Absolute: 0.1 10*3/uL (ref 0.0–0.1)
Basophils Relative: 1 %
Eosinophils Absolute: 0.3 10*3/uL (ref 0.0–0.5)
Eosinophils Relative: 3 %
HCT: 40.4 % (ref 39.0–52.0)
Hemoglobin: 13.3 g/dL (ref 13.0–17.0)
Immature Granulocytes: 1 %
Lymphocytes Relative: 24 %
Lymphs Abs: 2.5 10*3/uL (ref 0.7–4.0)
MCH: 29.9 pg (ref 26.0–34.0)
MCHC: 32.9 g/dL (ref 30.0–36.0)
MCV: 90.8 fL (ref 80.0–100.0)
Monocytes Absolute: 1 10*3/uL (ref 0.1–1.0)
Monocytes Relative: 10 %
Neutro Abs: 6.6 10*3/uL (ref 1.7–7.7)
Neutrophils Relative %: 61 %
Platelets: 317 10*3/uL (ref 150–400)
RBC: 4.45 MIL/uL (ref 4.22–5.81)
RDW: 13.4 % (ref 11.5–15.5)
WBC: 10.4 10*3/uL (ref 4.0–10.5)
nRBC: 0 % (ref 0.0–0.2)

## 2021-03-11 LAB — PROTIME-INR
INR: 0.9 (ref 0.8–1.2)
Prothrombin Time: 12.5 seconds (ref 11.4–15.2)

## 2021-03-11 LAB — BASIC METABOLIC PANEL
Anion gap: 15 (ref 5–15)
BUN: 24 mg/dL — ABNORMAL HIGH (ref 8–23)
CO2: 19 mmol/L — ABNORMAL LOW (ref 22–32)
Calcium: 9.5 mg/dL (ref 8.9–10.3)
Chloride: 103 mmol/L (ref 98–111)
Creatinine, Ser: 1.28 mg/dL — ABNORMAL HIGH (ref 0.61–1.24)
GFR, Estimated: 53 mL/min — ABNORMAL LOW (ref >60)
Glucose, Bld: 115 mg/dL — ABNORMAL HIGH (ref 70–99)
Potassium: 4.1 mmol/L (ref 3.5–5.1)
Sodium: 137 mmol/L (ref 135–145)

## 2021-03-11 LAB — APTT: aPTT: 30 seconds (ref 24–36)

## 2021-03-11 MED ORDER — LIDOCAINE HCL URETHRAL/MUCOSAL 2 % EX GEL
1.0000 "application " | Freq: Once | CUTANEOUS | Status: AC
  Administered 2021-03-11: 1 via TOPICAL
  Filled 2021-03-11: qty 11

## 2021-03-11 MED ORDER — FENTANYL CITRATE PF 50 MCG/ML IJ SOSY
50.0000 ug | PREFILLED_SYRINGE | Freq: Once | INTRAMUSCULAR | Status: AC
  Administered 2021-03-11: 50 ug via INTRAVENOUS
  Filled 2021-03-11: qty 1

## 2021-03-11 MED ORDER — SODIUM CHLORIDE 0.9 % IV SOLN
2.0000 g | Freq: Every day | INTRAVENOUS | Status: DC
  Administered 2021-03-12 (×2): 2 g via INTRAVENOUS
  Filled 2021-03-11: qty 20

## 2021-03-11 MED ORDER — LABETALOL HCL 5 MG/ML IV SOLN
10.0000 mg | Freq: Once | INTRAVENOUS | Status: AC
  Administered 2021-03-11: 10 mg via INTRAVENOUS
  Filled 2021-03-11: qty 4

## 2021-03-11 NOTE — ED Notes (Signed)
EDP notified of continued high BP

## 2021-03-11 NOTE — ED Triage Notes (Addendum)
Pt arrived POV from home c/o difficulty urinating. Pt states the last time he was able to urinate was this morning. Pt states he is starting to hurt. Pt also endorses bleeding from his penis.
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
## 2021-03-11 NOTE — Op Note (Signed)
Procedure: 1.  Cystoscopy with balloon dilation of mid urethral stricture. 2.  Cystoscopy with balloon dilation of bladder neck contracture. 3.  Complicated Foley catheter placement with clot evacuation.  Preop diagnosis: Clot retention with history of urethral stricture.  Postop diagnosis: Same with mid urethral stricture and bladder neck contracture.  Surgeon: Dr. Irine Seal.  Anesthesia: Local with 2% lidocaine jelly.  Drain: 22 Teacher, early years/pre catheter.  Specimen: Urine culture from fresh catheter.  EBL: Approximately 50 mL of clot.  Complications: None.  Indications: The patient is an 88 year old male with a complicated urologic history including radical prostatectomy with salvage radiation and PSA recurrent prostate cancer along with a prior penile prosthesis placement and a history of urethral stricture disease requiring urology catheter placement in the past.  Procedure: He was supine in the ER on the stretcher.  Initially his genitalia was prepped with Betadine solution he was draped in the usual sterile fashion.  Urethra was instilled with lubricating jelly and attempt was made to place a 14 French coud Foley catheter.  The catheter would not progress beyond the mid urethra.  The flexible scope was then prepared and inserted and the stricture was noted in the proximal anterior urethra.  A sensor wire was advanced through the stricture and I was able to get it into the bladder based on the resistance after an appropriate distance.  I removed the cystoscope and placed a 6 French open-ended catheter over the wire and the wire was removed.  The catheter was backed out until there was little urine drainage confirm intravesical placement.  The sensor wire was then replaced and the open-ended catheter was removed.  The 24 French 15 cm balloon dilation catheter was then advanced over the wire across the anterior urethral stricture.  The balloon was dilated to 20 atm for an  appropriate period time and then deflated.  The balloon was removed and the cystoscope was then reinserted alongside the wire.  I was able to get through the anterior urethral stricture and up to the external sphincter.  I was unable to pass the scope beyond the external sphincter as the urethra was quite fixed and there was a shelf right at the level of the bladder neck.  The cystoscope was then removed.  I reinserted the balloon dilation catheter and was able to negotiated across the bladder neck which I then dilated to 24 [place] with the balloon.  I removed the balloon catheter and then passed an 16 [place] council catheter over the wire into the bladder there was still some resistance at the bladder neck but I was able to get it successfully into the bladder.  The balloon was filled with 10 mL of sterile fluid and the wire was removed.  The catheter was then hand irrigated with sterile water with return of approximately 50 cc of clot and several 100 mL of bloody irrigant.  Once the irrigant returned only light pink the cath was placed to straight drainage and secured to his leg.  A specimen will be obtained from the fresh catheter for culture and he will be given Rocephin for prophylaxis.  There were no complications during the procedure.  I  did not inspect the bladder with the cystoscope because of my concern for losing access and also the presence of a large amount of clot which would make flexible cystoscopy a little value.  He may need repeat cystoscopy in the future.

## 2021-03-11 NOTE — ED Notes (Signed)
RN attempted a foley insertion attempt with a 14 french and was unsuccessful due to meeting resistance. MD notified of complication

## 2021-03-11 NOTE — ED Provider Triage Note (Signed)
Emergency Medicine Provider Triage Evaluation Note  Andre Casey , a 88 y.o. male  was evaluated in triage.  Pt complains of difficulty urinating onset this morning.  He has a history of prostate cancer and penile implant.  Has associated bleeding from penis, penile pain.  Has not tried any medications for symptoms.  Denies abdominal pain, fever, chills, dysuria, nausea, vomiting, chest pain, shortness of breath.  Denies anticoagulant use.   Review of Systems  Positive: As per HPI above Negative: Fever, chills  Physical Exam  BP (!) 185/132    Pulse (!) 107    Temp 97.8 F (36.6 C) (Oral)    SpO2 100%  Gen:   Awake, no distress   Resp:  Normal effort  MSK:   Moves extremities without difficulty  Other:  RN chaperone present for exam.  Moderate amount of bleeding noted from penis on exam.  Patient with penile implant in place.  Tenderness to palpation noted to shaft of penis.  Rectal mucosa noted on exam.  No evidence of gross rectal bleeding.  Medical Decision Making  Medically screening exam initiated at 7:08 PM.  Appropriate orders placed.  Andre Casey. was informed that the remainder of the evaluation will be completed by another provider, this initial triage assessment does not replace that evaluation, and the importance of remaining in the ED until their evaluation is complete.  7:16 PM - Discussed with RN that patient is in need of a room immediately. RN aware and working on room placement.    Yides Saidi A, PA-C 03/11/21 1919

## 2021-03-11 NOTE — ED Notes (Signed)
Triage stating bladder scan was 184mL

## 2021-03-11 NOTE — ED Notes (Signed)
Catheter placed by urologist and irrigated with RN at bedside.

## 2021-03-11 NOTE — ED Notes (Signed)
Urology cart at bedside 

## 2021-03-11 NOTE — ED Provider Notes (Signed)
[facility] EMERGENCY DEPARTMENT Provider Note   CSN: [id number] Arrival date & time: 03/11/21  1702     History  Chief Complaint  Patient presents with   trouble urinating    Hematuria    Andre Casey. is a 88 y.o. male.  The history is provided by the patient. No language interpreter was used.  Hematuria This is a recurrent problem. The current episode started 12 to 24 hours ago. The problem occurs constantly. The problem has not changed since onset.Associated symptoms include abdominal pain. Pertinent negatives include no chest pain, no headaches and no shortness of breath. Nothing aggravates the symptoms. Nothing relieves the symptoms. He has tried nothing for the symptoms. The treatment provided no relief.      Home Medications Prior to Admission medications   Medication Sig Start Date End Date Taking? Authorizing Provider  acetaminophen (TYLENOL) 500 MG tablet Take 1,000 mg by mouth every 8 (eight) hours as needed for mild pain or headache.    [provider]  amLODipine (NORVASC) 5 MG tablet TAKE 1 TABLET BY MOUTH ONCE DAILY Patient taking differently: Take 5 mg by mouth daily. 01/18/18   Barrett, Evelene Croon, PA-C  aspirin EC 81 MG tablet Take 1 tablet (81 mg total) by mouth daily. Swallow whole. 08/14/20   Lelon Perla, MD  atorvastatin (LIPITOR) 40 MG tablet Take 1 tablet (40 mg total) by mouth daily at 6 PM. Patient taking differently: Take 40 mg by mouth daily. 12/16/17   Lelon Perla, MD  buPROPion (WELLBUTRIN XL) 150 MG 24 hr tablet Take 150 mg by mouth daily.    [provider]  carvedilol (COREG) 6.25 MG tablet Take 1 tablet (6.25 mg total) by mouth 2 (two) times daily with a meal. 04/30/16   Lelon Perla, MD  famotidine (PEPCID) 40 MG tablet 1 tablet 01/10/20   [provider]  lisinopril-hydrochlorothiazide (ZESTORETIC) 20-25 MG tablet Take 1 tablet by mouth daily. Please hold until follow-up with  your doctor 05/03/20   Rai, Vernelle Emerald, MD  memantine (NAMENDA) 10 MG tablet Take 1 tablet (10 mg total) by mouth 2 (two) times daily. 04/03/20   Marcial Pacas, MD  Multiple Vitamins-Minerals (MULTIVITAMIN WITH MINERALS) tablet Take 1 tablet by mouth daily.    [provider]  polyethylene glycol (MIRALAX / GLYCOLAX) 17 g packet Take 17 g by mouth daily as needed for mild constipation (Also available over-the-counter). 05/03/20   Rai, Vernelle Emerald, MD  traMADol (ULTRAM) 50 MG tablet Take 1 tablet (50 mg total) by mouth every 6 (six) hours as needed. Patient taking differently: Take 50 mg by mouth every 6 (six) hours as needed for moderate pain. 04/29/20   Veryl Speak, MD      Allergies    Donepezil and Namzaric [memantine hcl-donepezil hcl]    Review of Systems   Review of Systems  Constitutional:  Negative for chills, diaphoresis, fatigue and fever.  Respiratory:  Negative for chest tightness and shortness of breath.   Cardiovascular:  Negative for chest pain.  Gastrointestinal:  Positive for abdominal pain. Negative for constipation, diarrhea, nausea and vomiting.  Genitourinary:  Positive for difficulty urinating, hematuria and penile pain. Negative for flank pain, frequency and testicular pain.  Musculoskeletal:  Negative for back pain, neck pain and neck stiffness.  Skin:  Negative for rash.  Neurological:  Negative for light-headedness and headaches.  Psychiatric/Behavioral:  Negative for agitation.   All other systems reviewed and are negative.  Physical Exam Updated Vital Signs BP (!) 185/132    Pulse (!) 107    Temp 97.8 F (36.6 C) (Oral)    SpO2 100%  Physical Exam Vitals and nursing note reviewed. Exam conducted with a chaperone present.  Constitutional:      General: He is not in acute distress.    Appearance: He is well-developed. He is not ill-appearing, toxic-appearing or diaphoretic.  HENT:     Head: Normocephalic and atraumatic.  Eyes:     Conjunctiva/sclera:  Conjunctivae normal.  Cardiovascular:     Rate and Rhythm: Normal rate and regular rhythm.     Heart sounds: No murmur heard. Pulmonary:     Effort: Pulmonary effort is normal. No respiratory distress.     Breath sounds: Normal breath sounds.  Abdominal:     General: Abdomen is flat.     Palpations: Abdomen is soft.     Tenderness: There is abdominal tenderness in the suprapubic area. There is no right CVA tenderness, left CVA tenderness, guarding or rebound.    Genitourinary:    Penis: Uncircumcised. No tenderness.      Testes: Normal.        Right: Tenderness not present.        Left: Tenderness not present.     Comments: Gross blood at the meatus.  No tenderness in the actual penis.  Tenderness in the suprapubic and groin area.  No testicle tenderness. Musculoskeletal:        General: No swelling, tenderness or signs of injury.     Cervical back: Neck supple.  Skin:    General: Skin is warm and dry.     Capillary Refill: Capillary refill takes less than 2 seconds.     Findings: No erythema.  Neurological:     Mental Status: He is alert.  Psychiatric:        Mood and Affect: Mood normal.    ED Results / Procedures / Treatments   Labs (all labs ordered are listed, but only abnormal results are displayed) Labs Reviewed  CBC WITH DIFFERENTIAL/PLATELET - Abnormal; Notable for the following components:      Result Value   Abs Immature Granulocytes 0.08 (*)    All other components within normal limits  BASIC METABOLIC PANEL - Abnormal; Notable for the following components:   CO2 19 (*)    Glucose, Bld 115 (*)    BUN 24 (*)    Creatinine, Ser 1.28 (*)    GFR, Estimated 53 (*)    All other components within normal limits  URINE CULTURE  PROTIME-INR  APTT  URINALYSIS, ROUTINE W REFLEX MICROSCOPIC  PSA  BASIC METABOLIC PANEL  MAGNESIUM  CBC    EKG None  Radiology No results found.  Procedures Procedures    Medications Ordered in ED Medications  cefTRIAXone  (ROCEPHIN) 2 g in sodium chloride 0.9 % 100 mL IVPB (has no administration in time range)  amLODipine (NORVASC) tablet 5 mg (has no administration in time range)  carvedilol (COREG) tablet 6.25 mg (has no administration in time range)  0.9 %  sodium chloride infusion (has no administration in time range)  acetaminophen (TYLENOL) tablet 650 mg (has no administration in time range)    Or  acetaminophen (TYLENOL) suppository 650 mg (has no administration in time range)  hydrALAZINE (APRESOLINE) tablet 25 mg (has no administration in time range)  lidocaine (XYLOCAINE) 2 % jelly 1 application (1 application Topical Given 03/11/21 2019)  labetalol (NORMODYNE) injection 10 mg (10  mg Intravenous Given 03/11/21 2106)  fentaNYL (SUBLIMAZE) injection 50 mcg (50 mcg Intravenous Given 03/11/21 2118)     ED Course/ Medical Decision Making/ A&P                           Medical Decision Making  Andre Casey. is a 88 y.o. male with a past medical history significant for hypertension, dyslipidemia, previous prostate cancer status post prostatectomy, CAD with PCI, hematuria, penile implant, and previous urinary retention who presents with hematuria and decreased urination.  According to patient, he cannot urinate since this morning and has had gross blood from his meatus.  He is worsening pain developing throughout the day in his groin and lower abdomen similar to when he has had obstruction.  He says that last year he had to have a Foley catheter placed by urology because of a known stricture.  He said that he went to urgent care today and they told him to come to the emergency department for evaluation and likely Foley placement.  Otherwise he reports no recent dysuria, fevers, chills, nausea, vomiting, constipation, or diarrhea.  He otherwise is feeling well aside from the lower abdominal discomfort and hematuria.  On my exam with a chaperone, testicles and groin were nontender.  Penis was nontender  but there was gross blood in the meatus.  He had some mild tenderness just superior to his penis and in the lower abdomen.  Otherwise bowel sounds normal.  Exam otherwise unremarkable.  Bedside bladder scan was performed showing over 100 cc of urine.  Foley catheter was attempted per previous management notes.  We use a small Foley catheter but was unsuccessful and met resistance.  Urology was called who will come assess patient in the emergency department.  If Foley catheter is placed successfully and his labs are otherwise reassuring, plan of care were to discharge home to follow-up with outpatient urology.  Labs are began to return.  Creatinine is similar to prior but slightly more elevated.  No leukocytosis.  INR normal.  Will try to get urinalysis and urine culture to make sure he does not have an infection leading to this hematuria once urology is able to place a catheter.  Anticipate follow-up on urinalysis results and discharge home if it is reassuring and Foley is successfully placed.  Patient was given a dose of blood pressure medicine due to elevated blood pressure and some pain medicine for his discomfort.  11:09 PM Urology was able to come down and after dilation was able to place a Foley catheter and perform irrigation.  Urinalysis will be sent to look for infection however urology is requesting admission to medicine service for hemoglobin trending due to the amount of blood that patient has continued to bleed and for further work-up and management.  Medicine he will be called for admission.         Final Clinical Impression(s) / ED Diagnoses Final diagnoses:  Gross hematuria  Urinary retention    Clinical Impression: 1. Gross hematuria   2. Urinary retention     Disposition: Admit  This note was prepared with assistance of Dragon voice recognition software. Occasional wrong-word or sound-a-like substitutions may have occurred due to the inherent limitations of  voice recognition software.      Bridgett Hattabaugh, Gwenyth Allegra, MD 03/12/21 [phone]

## 2021-03-11 NOTE — ED Notes (Signed)
Bladder scan performed with MD at bedside. Bladder scan showed urine with an obstruction

## 2021-03-12 ENCOUNTER — Encounter (HOSPITAL_COMMUNITY): Payer: Self-pay | Admitting: Family Medicine

## 2021-03-12 ENCOUNTER — Observation Stay (HOSPITAL_COMMUNITY): Payer: Medicare HMO

## 2021-03-12 ENCOUNTER — Other Ambulatory Visit (HOSPITAL_COMMUNITY): Payer: Self-pay

## 2021-03-12 DIAGNOSIS — Z923 Personal history of irradiation: Secondary | ICD-10-CM | POA: Diagnosis not present

## 2021-03-12 DIAGNOSIS — R310 Gross hematuria: Secondary | ICD-10-CM | POA: Diagnosis present

## 2021-03-12 DIAGNOSIS — I2510 Atherosclerotic heart disease of native coronary artery without angina pectoris: Secondary | ICD-10-CM | POA: Diagnosis present

## 2021-03-12 DIAGNOSIS — E7800 Pure hypercholesterolemia, unspecified: Secondary | ICD-10-CM | POA: Diagnosis present

## 2021-03-12 DIAGNOSIS — G3184 Mild cognitive impairment, so stated: Secondary | ICD-10-CM | POA: Diagnosis present

## 2021-03-12 DIAGNOSIS — N2889 Other specified disorders of kidney and ureter: Secondary | ICD-10-CM | POA: Diagnosis present

## 2021-03-12 DIAGNOSIS — Z8249 Family history of ischemic heart disease and other diseases of the circulatory system: Secondary | ICD-10-CM | POA: Diagnosis not present

## 2021-03-12 DIAGNOSIS — R339 Retention of urine, unspecified: Secondary | ICD-10-CM | POA: Diagnosis present

## 2021-03-12 DIAGNOSIS — C61 Malignant neoplasm of prostate: Secondary | ICD-10-CM | POA: Diagnosis present

## 2021-03-12 DIAGNOSIS — Z85828 Personal history of other malignant neoplasm of skin: Secondary | ICD-10-CM | POA: Diagnosis not present

## 2021-03-12 DIAGNOSIS — N179 Acute kidney failure, unspecified: Secondary | ICD-10-CM | POA: Diagnosis present

## 2021-03-12 DIAGNOSIS — N320 Bladder-neck obstruction: Secondary | ICD-10-CM | POA: Diagnosis present

## 2021-03-12 DIAGNOSIS — Z87891 Personal history of nicotine dependence: Secondary | ICD-10-CM | POA: Diagnosis not present

## 2021-03-12 DIAGNOSIS — I160 Hypertensive urgency: Secondary | ICD-10-CM | POA: Diagnosis present

## 2021-03-12 DIAGNOSIS — N35919 Unspecified urethral stricture, male, unspecified site: Secondary | ICD-10-CM | POA: Diagnosis present

## 2021-03-12 DIAGNOSIS — Z955 Presence of coronary angioplasty implant and graft: Secondary | ICD-10-CM | POA: Diagnosis not present

## 2021-03-12 DIAGNOSIS — I10 Essential (primary) hypertension: Secondary | ICD-10-CM | POA: Diagnosis present

## 2021-03-12 DIAGNOSIS — N289 Disorder of kidney and ureter, unspecified: Secondary | ICD-10-CM

## 2021-03-12 DIAGNOSIS — C774 Secondary and unspecified malignant neoplasm of inguinal and lower limb lymph nodes: Secondary | ICD-10-CM | POA: Diagnosis present

## 2021-03-12 DIAGNOSIS — Z66 Do not resuscitate: Secondary | ICD-10-CM | POA: Diagnosis present

## 2021-03-12 DIAGNOSIS — M47816 Spondylosis without myelopathy or radiculopathy, lumbar region: Secondary | ICD-10-CM | POA: Diagnosis present

## 2021-03-12 DIAGNOSIS — Z9079 Acquired absence of other genital organ(s): Secondary | ICD-10-CM | POA: Diagnosis not present

## 2021-03-12 DIAGNOSIS — Z87440 Personal history of urinary (tract) infections: Secondary | ICD-10-CM | POA: Diagnosis not present

## 2021-03-12 DIAGNOSIS — Z96611 Presence of right artificial shoulder joint: Secondary | ICD-10-CM | POA: Diagnosis present

## 2021-03-12 DIAGNOSIS — C772 Secondary and unspecified malignant neoplasm of intra-abdominal lymph nodes: Secondary | ICD-10-CM | POA: Diagnosis present

## 2021-03-12 LAB — URINALYSIS, MICROSCOPIC (REFLEX): RBC / HPF: >50 RBC/hpf (ref 0–5)

## 2021-03-12 LAB — BASIC METABOLIC PANEL
Anion gap: 13 (ref 5–15)
BUN: 22 mg/dL (ref 8–23)
CO2: 22 mmol/L (ref 22–32)
Calcium: 8.7 mg/dL — ABNORMAL LOW (ref 8.9–10.3)
Chloride: 103 mmol/L (ref 98–111)
Creatinine, Ser: 1.15 mg/dL (ref 0.61–1.24)
GFR, Estimated: >60 mL/min (ref >60)
Glucose, Bld: 127 mg/dL — ABNORMAL HIGH (ref 70–99)
Potassium: 3.6 mmol/L (ref 3.5–5.1)
Sodium: 138 mmol/L (ref 135–145)

## 2021-03-12 LAB — URINALYSIS, ROUTINE W REFLEX MICROSCOPIC
Bilirubin Urine: NEGATIVE
Glucose, UA: 250 mg/dL — AB
Ketones, ur: 15 mg/dL — AB
Nitrite: POSITIVE — AB
Protein, ur: >300 mg/dL — AB
Specific Gravity, Urine: 1.01 (ref 1.005–1.030)
pH: 7 (ref 5.0–8.0)

## 2021-03-12 LAB — CBC
HCT: 35.4 % — ABNORMAL LOW (ref 39.0–52.0)
Hemoglobin: 11.3 g/dL — ABNORMAL LOW (ref 13.0–17.0)
MCH: 29.5 pg (ref 26.0–34.0)
MCHC: 31.9 g/dL (ref 30.0–36.0)
MCV: 92.4 fL (ref 80.0–100.0)
Platelets: 277 10*3/uL (ref 150–400)
RBC: 3.83 MIL/uL — ABNORMAL LOW (ref 4.22–5.81)
RDW: 13.8 % (ref 11.5–15.5)
WBC: 10.7 10*3/uL — ABNORMAL HIGH (ref 4.0–10.5)
nRBC: 0 % (ref 0.0–0.2)

## 2021-03-12 LAB — MAGNESIUM: Magnesium: 2.2 mg/dL (ref 1.7–2.4)

## 2021-03-12 LAB — URINE CULTURE: Culture: NO GROWTH

## 2021-03-12 LAB — PSA: Prostatic Specific Antigen: 182 ng/mL — ABNORMAL HIGH (ref 0.00–4.00)

## 2021-03-12 MED ORDER — SODIUM CHLORIDE 0.9 % IV SOLN: INTRAVENOUS | Status: AC

## 2021-03-12 MED ORDER — MEMANTINE HCL 10 MG PO TABS
10.0000 mg | ORAL_TABLET | Freq: Two times a day (BID) | ORAL | Status: DC
  Administered 2021-03-12 – 2021-03-13 (×3): 10 mg via ORAL
  Filled 2021-03-12 (×5): qty 1

## 2021-03-12 MED ORDER — ACETAMINOPHEN 650 MG RE SUPP: 650.0000 mg | Freq: Four times a day (QID) | RECTAL | Status: DC | PRN

## 2021-03-12 MED ORDER — ADULT MULTIVITAMIN W/MINERALS CH
1.0000 | ORAL_TABLET | Freq: Every morning | ORAL | Status: DC
  Administered 2021-03-13: 1 via ORAL
  Filled 2021-03-12: qty 1

## 2021-03-12 MED ORDER — CARVEDILOL 6.25 MG PO TABS
6.2500 mg | ORAL_TABLET | Freq: Two times a day (BID) | ORAL | Status: DC
  Administered 2021-03-12 – 2021-03-13 (×3): 6.25 mg via ORAL
  Filled 2021-03-12: qty 1
  Filled 2021-03-12: qty 2

## 2021-03-12 MED ORDER — HYDRALAZINE HCL 25 MG PO TABS
25.0000 mg | ORAL_TABLET | Freq: Four times a day (QID) | ORAL | Status: DC | PRN
  Administered 2021-03-12: 25 mg via ORAL
  Filled 2021-03-12: qty 1

## 2021-03-12 MED ORDER — LISINOPRIL-HYDROCHLOROTHIAZIDE 20-12.5 MG PO TABS: 1.0000 | ORAL_TABLET | Freq: Every morning | ORAL | Status: DC

## 2021-03-12 MED ORDER — LISINOPRIL 20 MG PO TABS
20.0000 mg | ORAL_TABLET | Freq: Every day | ORAL | Status: DC
  Administered 2021-03-12 – 2021-03-13 (×2): 20 mg via ORAL
  Filled 2021-03-12 (×2): qty 1

## 2021-03-12 MED ORDER — AMLODIPINE BESYLATE 5 MG PO TABS
5.0000 mg | ORAL_TABLET | Freq: Every day | ORAL | Status: DC
  Administered 2021-03-12 – 2021-03-13 (×2): 5 mg via ORAL
  Filled 2021-03-12 (×2): qty 1

## 2021-03-12 MED ORDER — HYDROCHLOROTHIAZIDE 12.5 MG PO TABS
12.5000 mg | ORAL_TABLET | Freq: Every day | ORAL | Status: DC
  Administered 2021-03-12 – 2021-03-13 (×2): 12.5 mg via ORAL
  Filled 2021-03-12 (×2): qty 1

## 2021-03-12 MED ORDER — IOHEXOL 300 MG/ML  SOLN
80.0000 mL | Freq: Once | INTRAMUSCULAR | Status: AC | PRN
  Administered 2021-03-12: 80 mL via INTRAVENOUS

## 2021-03-12 MED ORDER — RISAQUAD PO CAPS
1.0000 | ORAL_CAPSULE | Freq: Every morning | ORAL | Status: DC
  Administered 2021-03-13: 1 via ORAL
  Filled 2021-03-12 (×2): qty 1

## 2021-03-12 MED ORDER — ATORVASTATIN CALCIUM 40 MG PO TABS
40.0000 mg | ORAL_TABLET | Freq: Every day | ORAL | Status: DC
  Administered 2021-03-12 – 2021-03-13 (×2): 40 mg via ORAL
  Filled 2021-03-12 (×2): qty 1

## 2021-03-12 MED ORDER — ACETAMINOPHEN 325 MG PO TABS
650.0000 mg | ORAL_TABLET | Freq: Four times a day (QID) | ORAL | Status: DC | PRN
  Administered 2021-03-12 (×2): 650 mg via ORAL
  Filled 2021-03-12 (×2): qty 2

## 2021-03-12 MED ORDER — CHLORHEXIDINE GLUCONATE CLOTH 2 % EX PADS
6.0000 | MEDICATED_PAD | Freq: Every day | CUTANEOUS | Status: DC
  Administered 2021-03-13: 6 via TOPICAL

## 2021-03-12 NOTE — Progress Notes (Signed)
PROGRESS NOTE    Andre Casey.  KZS:[id number] DOB: Jul 26, 1929 DOA: 03/11/2021 PCP: [name], MD   Chief Complaint  Patient presents with   trouble urinating    Hematuria  Brief Narrative/Hospital Course: Andre Casey., 88 y.o. male with PMH of  prostate cancer status post radical prostatectomy in 1992 and chemical recurrence in 1994, urethral stricture, coronary artery disease, and hypertension, now presenting with gross hematuria, difficulty voiding, and pain. He was seen in the ED found to have uncontrolled hypertension, labs showed creatinine 1.2 CBC unremarkable attempts to place urethral catheter were unsuccessful in the ED urology was consulted and performed cystoscopy with dilation of urethral stricture and bladder neck contracture placed in a catheter and evacuated of clot.  Patient is admitted for further management initially with Foley irrigation. Events WBC 0-5 RBC more than 50 leukocyte moderate w/ positive nitrite.'  Subjective: Seen and examined this morning.  Urine is clearing up in the Foley. Denies nausea vomiting chest pain.  Assessment & Plan:  Gross hematuria with clot retention Urethral stricture Prostate cancer: Underwent cystoscopy, dilation of urethral stricture and bladder neck, has a Foley in place urine is clearing up will need long-term Foley catheter placement.  Discussed with urology obtaining CT abdomen pelvis with IV contrast and bone scan due to prostate cancer.  Planning for ADT with firmagon 240 mg by urology Follow-up urine culture continue intake empiric ceftriaxone for now.  CT abdomen report came back with bulky retroperitoneal lymphadenopathy and left inguinal adenopathy concerning for metastasis, exhibiting mass in the lateral right kidney concerning for RCC, hypodense lesion in the pancreatic head as seen in previous studies..  Mild cognitive impairment: Continue supportive care delirium precaution, resume amantadine  multivitamins.  Hypertensive urgency blood pressure remains poorly controlled on admission 220/120, continue home Norvasc Coreg. Lisinopril -HCTZ was held, resume as creat is better.  Continue as needed IV medication as needed  Acute renal insufficiency: Creatinine improved to 1.1 Recent Labs  Lab 03/11/21 1908 03/12/21 0306  BUN 24* 22  CREATININE 1.28* 1.15    CAD: History of 3 stents in 2017, no anginal symptoms.  Aspirin on hold due to hematuria continue statin and beta-blocker  Ct abd/pelvis: Interval development of bulky retroperitoneal lymphadenopathy and left inguinal adenopathy. Concerning for metastasis. 2. Stable size of an enhancing exophytic mass at the lateral right kidney, again concerning for renal cell carcinoma. 3. Stable hypodense mass in the pancreatic head, as seen on previous studies. 4. Prostatectomy and pelvic lymph node dissection surgical changes. Urinary bladder wall thickening and urinary bladder cathete  DVT prophylaxis: SCDs Start: 03/12/21 0007 Code Status:   Code Status: DNR Family Communication: plan of care discussed with patient at bedside. Status is: ADMITTED AS Observation Remains hospitalized for ongoing management of his gross hematuria and will need at least 2 midnight stay. Disposition: Currently not medically stable for discharge. Anticipated Disposition: TBD  Total time spent in the care of this patient 35 MINUTES Objective: Vitals last 24 hrs: Vitals:   03/12/21 0915 03/12/21 0920 03/12/21 1000 03/12/21 1300  BP: (!) 182/120 (!) 181/97 (!) 192/102 123/70  Pulse: 85 71 91 73  Resp: 19 10 20  (!) 23  Temp:      TempSrc:      SpO2: 99% 98% 100% 94%  Weight:      Height:       Weight change:  No intake or output data in the 24 hours ending 03/12/21 1332 Net IO Since Admission:  No IO data has been entered for this period [03/12/21 1332]   Physical Examination: General exam: AA fairly oriented, not in distress, weak,older than stated  age. HEENT:Oral mucosa moist, Ear/Nose WNL grossly,dentition normal. Respiratory system: B/l clear BS, no use of accessory muscle, non tender. Cardiovascular system: S1 & S2 +,No JVD. Gastrointestinal system: Abdomen soft, NT,ND, BS+. Nervous System:Alert, awake, moving extremities. Extremities: edema none, distal peripheral pulses palpable.  Skin: No rashes, no icterus. MSK: Normal muscle bulk, tone, power.  Medications reviewed:  Scheduled Meds:  amLODipine  5 mg Oral Daily   atorvastatin  40 mg Oral Daily   carvedilol  6.25 mg Oral BID WC   lisinopril-hydrochlorothiazide  1 tablet Oral q morning   memantine  10 mg Oral BID   multivitamin with minerals  1 tablet Oral q morning   Probiotic   Oral q morning   Continuous Infusions:  cefTRIAXone (ROCEPHIN)  IV Stopped (03/12/21 0258)   Diet Order             Diet Heart Room service appropriate? Yes; Fluid consistency: Thin  Diet effective now                 Weight change:   Wt Readings from Last 3 Encounters:  03/11/21 61.2 kg  08/14/20 58.9 kg  04/30/20 56.5 kg     Consultants:see note  Procedures:see note Antimicrobials: Anti-infectives (From admission, onward)    Start     Dose/Rate Route Frequency Ordered Stop   03/12/21 0100  cefTRIAXone (ROCEPHIN) 2 g in sodium chloride 0.9 % 100 mL IVPB        2 g 200 mL/hr over 30 Minutes Intravenous Daily at bedtime 03/11/21 2249        Culture/Microbiology    Component Value Date/Time   SDES  04/30/2020 1553    BLOOD RIGHT ANTECUBITAL Performed at [facility], [place] [address] [place], [place] [address]    SPECREQUEST  04/30/2020 1553    BOTTLES DRAWN AEROBIC AND ANAEROBIC Blood Culture adequate volume Performed at [facility] [address], [place], [place] [address]    CULT ESCHERICHIA COLI (A) 04/30/2020 1553   REPTSTATUS 05/03/2020 FINAL 04/30/2020 1553    Other culture-see note  Unresulted Labs (From admission,  onward)     Start     Ordered   03/12/21 8295  Basic metabolic panel  Daily,   R      03/12/21 0008   03/12/21 0500  CBC  Daily,   R      03/12/21 0008   03/11/21 2250  Urine Culture  Once,   STAT        03/11/21 2249           Data Reviewed: I have personally reviewed following labs and imaging studies CBC: Recent Labs  Lab 03/11/21 1908 03/12/21 0306  WBC 10.4 10.7*  NEUTROABS 6.6  --   HGB 13.3 11.3*  HCT 40.4 35.4*  MCV 90.8 92.4  PLT 317 621   Basic Metabolic Panel: Recent Labs  Lab 03/11/21 1908 03/12/21 0306  NA 137 138  K 4.1 3.6  CL 103 103  CO2 19* 22  GLUCOSE 115* 127*  BUN 24* 22  CREATININE 1.28* 1.15  CALCIUM 9.5 8.7*  MG  --  2.2   GFR: Estimated Creatinine Clearance: 37.7 mL/min (by C-G formula based on SCr of 1.15 mg/dL). Liver Function Tests: No results for input(s): AST, ALT, ALKPHOS, BILITOT, PROT, ALBUMIN in  the last 168 hours. No results for input(s): LIPASE, AMYLASE in the last 168 hours. No results for input(s): AMMONIA in the last 168 hours. Coagulation Profile: Recent Labs  Lab 03/11/21 1908  INR 0.9   Cardiac Enzymes: No results for input(s): CKTOTAL, CKMB, CKMBINDEX, TROPONINI in the last 168 hours. BNP (last 3 results) No results for input(s): PROBNP in the last 8760 hours. HbA1C: No results for input(s): HGBA1C in the last 72 hours. CBG: No results for input(s): GLUCAP in the last 168 hours. Lipid Profile: No results for input(s): CHOL, HDL, LDLCALC, TRIG, CHOLHDL, LDLDIRECT in the last 72 hours. Thyroid Function Tests: No results for input(s): TSH, T4TOTAL, FREET4, T3FREE, THYROIDAB in the last 72 hours. Anemia Panel: No results for input(s): VITAMINB12, FOLATE, FERRITIN, TIBC, IRON, RETICCTPCT in the last 72 hours. Sepsis Labs: No results for input(s): PROCALCITON, LATICACIDVEN in the last 168 hours.  No results found for this or any previous visit (from the past 240 hour(s)).   Radiology Studies: CT ABDOMEN  PELVIS W CONTRAST  Result Date: 03/12/2021 CLINICAL DATA:  History of prostate cancer, follow-up EXAM: CT ABDOMEN AND PELVIS WITH CONTRAST TECHNIQUE: Multidetector CT imaging of the abdomen and pelvis was performed using the standard protocol following bolus administration of intravenous contrast. RADIATION DOSE REDUCTION: This exam was performed according to the departmental dose-optimization program which includes automated exposure control, adjustment of the mA and/or kV according to patient size and/or use of iterative reconstruction technique. CONTRAST:  52mL OMNIPAQUE IOHEXOL 300 MG/ML  SOLN COMPARISON:  CT abdomen and pelvis 04/30/2020 FINDINGS: Lower chest: Emphysematous changes of the visualized lower lungs. Thin pleural calcifications posteriorly in the right lower lobe. Hepatobiliary: No focal liver abnormality is seen. No gallstones, gallbladder wall thickening, or biliary dilatation. Pancreas: 1.3 cm mildly hypodense mass in the pancreatic head, stable in size since previous studies including a previous MRI. Stable mild pancreatic ductal dilatation measuring up to 4 mm in diameter in the pancreatic head. Spleen: Normal in size without focal abnormality. Adrenals/Urinary Tract: Adrenal glands are normal. Stable size of an exophytic enhancing mass at the lateral right kidney measuring 2.5 x 1.7 x 2.4 cm. Several small hypodense renal cortical cysts bilaterally. No hydronephrosis. Urinary bladder contains a catheter and demonstrates diffuse wall thickening with no focal mass appreciated. Stomach/Bowel: No bowel obstruction, free air or pneumatosis. Extensive colonic diverticulosis. No bowel wall edema identified. Vascular/Lymphatic: Interval enlargement of numerous retroperitoneal lymph nodes in the abdomen and pelvis with the largest nodes measuring 2.8 x 1.8 cm posterior to the aorta at the level of the lower pole of the kidneys, and 3.2 x 2 cm in the left pelvis region of the distal external iliac  chain. Also interval enlargement of several prominent left inguinal lymph nodes measuring up to 2.1 x 1.3 cm. Moderate to severe atherosclerotic disease noted. Reproductive: Prostatectomy surgical changes as well as evidence of bilateral pelvic lymph node dissection. Penile implant with reservoir in the anterior lower abdomen/pelvis noted. Other: No ascites. Musculoskeletal: Severe degenerative changes of the visualized spine. No suspicious bony lesions identified. IMPRESSION: 1. Interval development of bulky retroperitoneal lymphadenopathy and left inguinal adenopathy. Concerning for metastasis. 2. Stable size of an enhancing exophytic mass at the lateral right kidney, again concerning for renal cell carcinoma. 3. Stable hypodense mass in the pancreatic head, as seen on previous studies. 4. Prostatectomy and pelvic lymph node dissection surgical changes. Urinary bladder wall thickening and urinary bladder catheter. 5. Other ancillary findings as described. Electronically Signed  By: Ofilia Neas M.D.   On: 03/12/2021 10:25     LOS: 0 days   Antonieta Pert, MD [facility]  03/12/2021, 1:32 PM

## 2021-03-12 NOTE — ED Notes (Signed)
[name] bladder irrigated via catheter.

## 2021-03-12 NOTE — ED Notes (Signed)
Placed Breakfast Order 

## 2021-03-12 NOTE — ED Notes (Signed)
Attempt to call family to notify of transfer to inpatient unit, no answer.

## 2021-03-12 NOTE — Progress Notes (Signed)
New Admission Note:   Arrival Method: stretcher  Mental Orientation:  alert and oriented  Telemetry: none  Assessment: Completed Skin: intact  IV: right forearm  Pain: 0/10  Tubes: none  Safety Measures: Safety Fall Prevention Plan has been given, discussed and signed Admission: Completed [facility] Orientation: Patient has been orientated to the room, unit and staff.  Family: none   Orders have been reviewed and implemented. Will continue to monitor the patient. Call light has been placed within reach and bed alarm has been activated.   Natalina Wieting RN [facility] Phone: [phone]

## 2021-03-12 NOTE — ED Notes (Signed)
Lunch Ordered °

## 2021-03-12 NOTE — ED Notes (Signed)
[name] foley continues to drain with pink urine

## 2021-03-12 NOTE — ED Notes (Signed)
Pt educated to notify RN or nurse tech if he begins to feel the increased pressure in his bladder
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
## 2021-03-12 NOTE — ED Notes (Signed)
[name] catheter checked and drainage continues

## 2021-03-12 NOTE — Plan of Care (Signed)
  Problem: Health Behavior/Discharge Planning: Goal: Ability to manage health-related needs will improve Outcome: Progressing   

## 2021-03-12 NOTE — ED Notes (Signed)
Bladder irrigated with 2 L sterile water. Multiple small to moderate clots removed. Urine returned is mostly clear and slightly pink in color. Pt tolerated well.

## 2021-03-12 NOTE — Progress Notes (Signed)
Subjective: Mr. Churchill continues to have some hematuria requiring catheter irrigation and some suprapubic discomfort.  His Cr is down some with foley placement.  His PSA is 182 which is up from about 7 in 2019 and is consistent with recurrent prostate cancer which could be contributing to his bladder neck issues.    ROS:  ROS  Anti-infectives: Anti-infectives (From admission, onward)    Start     Dose/Rate Route Frequency Ordered Stop   03/12/21 0100  cefTRIAXone (ROCEPHIN) 2 g in sodium chloride 0.9 % 100 mL IVPB        2 g 200 mL/hr over 30 Minutes Intravenous Daily at bedtime 03/11/21 2249         Current Facility-Administered Medications  Medication Dose Route Frequency Provider Last Rate Last Admin   0.9 %  sodium chloride infusion   Intravenous Continuous Opyd, Ilene Qua, MD 125 mL/hr at 03/12/21 0132 New Bag at 03/12/21 0132   acetaminophen (TYLENOL) tablet 650 mg  650 mg Oral Q6H PRN Opyd, Ilene Qua, MD       Or   acetaminophen (TYLENOL) suppository 650 mg  650 mg Rectal Q6H PRN Opyd, Ilene Qua, MD       amLODipine (NORVASC) tablet 5 mg  5 mg Oral Daily Opyd, Ilene Qua, MD       atorvastatin (LIPITOR) tablet 40 mg  40 mg Oral Daily Opyd, Ilene Qua, MD       carvedilol (COREG) tablet 6.25 mg  6.25 mg Oral BID WC Opyd, Ilene Qua, MD   6.25 mg at 03/12/21 0749   cefTRIAXone (ROCEPHIN) 2 g in sodium chloride 0.9 % 100 mL IVPB  2 g Intravenous QHS Opyd, Ilene Qua, MD   Stopped at 03/12/21 0258   hydrALAZINE (APRESOLINE) tablet 25 mg  25 mg Oral Q6H PRN Opyd, Ilene Qua, MD       Current Outpatient Medications  Medication Sig Dispense Refill   acetaminophen (TYLENOL) 500 MG tablet Take 1,000 mg by mouth every 8 (eight) hours as needed for mild pain or headache.     aspirin EC 81 MG tablet Take 1 tablet (81 mg total) by mouth daily. Swallow whole. 90 tablet 3   lisinopril-hydrochlorothiazide (ZESTORETIC) 20-25 MG tablet Take 1 tablet by mouth daily. Please hold until follow-up  with your doctor     memantine (NAMENDA) 10 MG tablet Take 1 tablet (10 mg total) by mouth 2 (two) times daily. 180 tablet 3   Multiple Vitamins-Minerals (MULTIVITAMIN WITH MINERALS) tablet Take 1 tablet by mouth daily.     amLODipine (NORVASC) 5 MG tablet TAKE 1 TABLET BY MOUTH ONCE DAILY (Patient taking differently: Take 5 mg by mouth daily.) 30 tablet 2   atorvastatin (LIPITOR) 40 MG tablet Take 1 tablet (40 mg total) by mouth daily at 6 PM. 30 tablet 3   buPROPion (WELLBUTRIN XL) 150 MG 24 hr tablet Take 150 mg by mouth daily.     carvedilol (COREG) 6.25 MG tablet Take 1 tablet (6.25 mg total) by mouth 2 (two) times daily with a meal. 180 tablet 3   famotidine (PEPCID) 40 MG tablet 1 tablet     polyethylene glycol (MIRALAX / GLYCOLAX) 17 g packet Take 17 g by mouth daily as needed for mild constipation (Also available over-the-counter). 30 each 0   traMADol (ULTRAM) 50 MG tablet Take 1 tablet (50 mg total) by mouth every 6 (six) hours as needed. (Patient taking differently: Take 50 mg by mouth every 6 (six) hours  as needed for moderate pain.) 12 tablet 0     Objective: Vital signs in last 24 hours: Temp:  [97.8 F (36.6 C)-98.7 F (37.1 C)] 98.7 F (37.1 C) (01/17 0745) Pulse Rate:  [62-107] 65 (01/17 0745) Resp:  [11-23] 23 (01/17 0745) BP: (136-222)/(73-142) 175/98 (01/17 0745) SpO2:  [95 %-100 %] 96 % (01/17 0745) Weight:  [61.2 kg] 61.2 kg (01/16 1929)  Intake/Output from previous day: No intake/output data recorded. Intake/Output this shift: No intake/output data recorded.   Physical Exam  Lab Results:  Recent Labs    03/11/21 1908 03/12/21 0306  WBC 10.4 10.7*  HGB 13.3 11.3*  HCT 40.4 35.4*  PLT 317 277   BMET Recent Labs    03/11/21 1908 03/12/21 0306  NA 137 138  K 4.1 3.6  CL 103 103  CO2 19* 22  GLUCOSE 115* 127*  BUN 24* 22  CREATININE 1.28* 1.15  CALCIUM 9.5 8.7*   PT/INR Recent Labs    03/11/21 1908  LABPROT 12.5  INR 0.9   ABG No  results for input(s): PHART, HCO3 in the last 72 hours.  Invalid input(s): PCO2, PO2  Studies/Results: No results found.   Assessment and Plan: Gross hematuria.  His urine remains bloody and requiring intermittent irrigation.  Continue current care.  Urethral stricture and bladder neck contracture.  He will probably be best served with LT foley catheter drainage.  The bladder neck contracture could be secondary to recurrent prostate cancer.  Prostate cancer.  His PSA is up to 182 from about 7 3 years ago.   He needs restaging with a CT AP with IV contrast and a bone scan and will probably need to get started on ADT with Firmagon 240mg  after imaging.       LOS: 0 days    Irine Seal 03/12/2021 [phone] Patient ID: Andre Ard., male   DOB: Jan 24, 1930, 88 y.o.   MRN: [id number]

## 2021-03-13 ENCOUNTER — Inpatient Hospital Stay (HOSPITAL_COMMUNITY): Payer: Medicare HMO

## 2021-03-13 LAB — CBC
HCT: 33.8 % — ABNORMAL LOW (ref 39.0–52.0)
Hemoglobin: 10.9 g/dL — ABNORMAL LOW (ref 13.0–17.0)
MCH: 29.6 pg (ref 26.0–34.0)
MCHC: 32.2 g/dL (ref 30.0–36.0)
MCV: 91.8 fL (ref 80.0–100.0)
Platelets: 235 10*3/uL (ref 150–400)
RBC: 3.68 MIL/uL — ABNORMAL LOW (ref 4.22–5.81)
RDW: 13.8 % (ref 11.5–15.5)
WBC: 10.2 10*3/uL (ref 4.0–10.5)
nRBC: 0 % (ref 0.0–0.2)

## 2021-03-13 LAB — BASIC METABOLIC PANEL
Anion gap: 9 (ref 5–15)
BUN: 19 mg/dL (ref 8–23)
CO2: 22 mmol/L (ref 22–32)
Calcium: 8.4 mg/dL — ABNORMAL LOW (ref 8.9–10.3)
Chloride: 104 mmol/L (ref 98–111)
Creatinine, Ser: 1.22 mg/dL (ref 0.61–1.24)
GFR, Estimated: 57 mL/min — ABNORMAL LOW (ref >60)
Glucose, Bld: 114 mg/dL — ABNORMAL HIGH (ref 70–99)
Potassium: 3.9 mmol/L (ref 3.5–5.1)
Sodium: 135 mmol/L (ref 135–145)

## 2021-03-13 MED ORDER — TECHNETIUM TC 99M MEDRONATE IV KIT
20.0000 | PACK | Freq: Once | INTRAVENOUS | Status: AC | PRN
  Administered 2021-03-13: 20 via INTRAVENOUS

## 2021-03-13 MED ORDER — ASPIRIN EC 81 MG PO TBEC: 81.0000 mg | DELAYED_RELEASE_TABLET | Freq: Every day | ORAL | 3 refills | Status: AC

## 2021-03-13 NOTE — TOC Initial Note (Signed)
Transition of Care (TOC) - Initial/Assessment Note    Patient Details  Name: Andre Casey. MRN: [id number] Date of Birth: 13-May-1929  Transition of [facility]) CM/SW Contact:    Milinda Antis, [place] Phone Number: 03/13/2021, 11:07 AM  Clinical Narrative:                 CSW met with the patient at bedside after receiving information that the patient may be from an assisted living facility.  The patient was alert, oriented, and pleasant during encounter.  The patient reports that he is from home with his wife.  CSW inquired about transportation home and the patient replied that his stepdaughter would transport.  No needs were mentioned from the patient at this time.    Expected Discharge Plan: Home/Self Care Barriers to Discharge: No Barriers Identified   Patient Goals and CMS Choice Patient states their goals for this hospitalization and ongoing recovery are:: Return home with his wife      Expected Discharge Plan and Services Expected Discharge Plan: Home/Self Care         Expected Discharge Date: 03/13/21                                    Prior Living Arrangements/Services   Lives with:: Self, Spouse   Do you feel safe going back to the place where you live?: Yes      Need for Family Participation in Patient Care: Yes (Comment) Care giver support system in place?: Yes (comment)   Criminal Activity/Legal Involvement Pertinent to Current Situation/Hospitalization: No - Comment as needed  Activities of Daily Living Home Assistive Devices/Equipment: None ADL Screening (condition at time of admission) Patient's cognitive ability adequate to safely complete daily activities?: No Is the patient deaf or have difficulty hearing?: No Does the patient have difficulty seeing, even when wearing glasses/contacts?: No Does the patient have difficulty concentrating, remembering, or making decisions?: Yes Patient able to express need for assistance with ADLs?:  No Does the patient have difficulty dressing or bathing?: Yes Independently performs ADLs?: No Communication: Needs assistance Dressing (OT): Needs assistance Grooming: Needs assistance Feeding: Needs assistance Bathing: Needs assistance Toileting: Needs assistance In/Out Bed: Needs assistance Walks in Home: Needs assistance Does the patient have difficulty walking or climbing stairs?: Yes Weakness of Legs: Both Weakness of Arms/Hands: Both  Permission Sought/Granted                  Emotional Assessment Appearance:: Appears stated age Attitude/Demeanor/Rapport: Engaged, Charismatic Affect (typically observed): Pleasant, Happy, Hopeful, Accepting Orientation: : Oriented to Self, Oriented to Place, Oriented to  Time, Oriented to Situation Alcohol / Substance Use: Not Applicable Psych Involvement: No (comment)  Admission diagnosis:  Urinary retention [R33.9] Gross hematuria [R31.0] Patient Active Problem List   Diagnosis Date Noted   Renal insufficiency 03/12/2021   Gross hematuria 03/11/2021   Urinary retention 04/30/2020   Acute lower UTI 04/30/2020   Hematuria 04/30/2020   Renal mass, right 12/04/2019   LBBB (left bundle branch block) 06/07/2019   Family history of Alzheimer's disease 03/25/2018   Atherosclerotic heart disease of native coronary artery with angina pectoris ([place]) 01/15/2016   CAD-S/P PCI/DES 01/15/2016   Elevated troponin - Peri-procedural type 4a MI. 01/15/2016   Unstable angina ([place]) 01-24-16   Family history of sudden cardiac death 01/24/2016   Dyslipidemia, goal LDL below 70 01/08/2016   Hypertensive urgency 01/08/2016  Mild cognitive impairment 07/18/2015  ° Paresthesia 07/18/2015  ° S/P shoulder replacement 04/20/2015  ° Malignant neoplasm of prostate (HCC) 02/09/2015  ° Urethral stricture 07/25/2013  ° Dysuria 01/05/2012  ° History of prostate cancer 08/04/2011  ° ED (erectile dysfunction) of organic origin 02/03/2011  ° Male urinary stress  incontinence 01/31/2011  ° HEMORRHOIDS-EXTERNAL 08/02/2007  ° INCONTINENCE, FECAL 08/02/2007  ° PERSONAL HX COLONIC POLYPS 08/02/2007  ° °PCP:  Shaw, Kimberlee, MD °Pharmacy:   °[id number] - [place], [place] - [address] °[address] °[place] [address] °Phone: [phone] Fax: [phone] ° ° ° ° °Social Determinants of Health (SDOH) Interventions °  ° °Readmission Risk Interventions °No flowsheet data found. ° ° °

## 2021-03-13 NOTE — Progress Notes (Signed)
Subjective: Andre Casey is doing well with the foley and the urine has cleared.   The CT shows no evidence of local recurrence but he has bulky retroperitoneal adenopathy consistent with metastatic prostate cancer.   HE has a stable right renal mass.   nsistent with recurrent prostate cancer which could be contributing to his bladder neck issues.   The urine culture was negative.  ROS:  Review of Systems  All other systems reviewed and are negative.  Anti-infectives: Anti-infectives (From admission, onward)    Start     Dose/Rate Route Frequency Ordered Stop   03/12/21 0100  cefTRIAXone (ROCEPHIN) 2 g in sodium chloride 0.9 % 100 mL IVPB        2 g 200 mL/hr over 30 Minutes Intravenous Daily at bedtime 03/11/21 2249         Current Facility-Administered Medications  Medication Dose Route Frequency Provider Last Rate Last Admin   acetaminophen (TYLENOL) tablet 650 mg  650 mg Oral Q6H PRN Opyd, Ilene Qua, MD   650 mg at 03/12/21 2043   Or   acetaminophen (TYLENOL) suppository 650 mg  650 mg Rectal Q6H PRN Opyd, Ilene Qua, MD       acidophilus (RISAQUAD) capsule 1 capsule  1 capsule Oral q morning Kc, Ramesh, MD       amLODipine (NORVASC) tablet 5 mg  5 mg Oral Daily Opyd, Ilene Qua, MD   5 mg at 03/12/21 4970   atorvastatin (LIPITOR) tablet 40 mg  40 mg Oral Daily Opyd, Ilene Qua, MD   40 mg at 03/12/21 2637   carvedilol (COREG) tablet 6.25 mg  6.25 mg Oral BID WC Opyd, Ilene Qua, MD   6.25 mg at 03/12/21 1634   cefTRIAXone (ROCEPHIN) 2 g in sodium chloride 0.9 % 100 mL IVPB  2 g Intravenous QHS Opyd, Ilene Qua, MD 200 mL/hr at 03/12/21 2046 2 g at 03/12/21 2046   Chlorhexidine Gluconate Cloth 2 % PADS 6 each  6 each Topical Daily Kc, Ramesh, MD       hydrALAZINE (APRESOLINE) tablet 25 mg  25 mg Oral Q6H PRN Opyd, Ilene Qua, MD   25 mg at 03/12/21 1013   lisinopril (ZESTRIL) tablet 20 mg  20 mg Oral Daily Kc, Ramesh, MD   20 mg at 03/12/21 1412   And   hydrochlorothiazide  (HYDRODIURIL) tablet 12.5 mg  12.5 mg Oral Daily Kc, Ramesh, MD   12.5 mg at 03/12/21 1412   memantine (NAMENDA) tablet 10 mg  10 mg Oral BID Kc, Maren [place], MD   10 mg at 03/12/21 2043   multivitamin with minerals tablet 1 tablet  1 tablet Oral q morning Kc, Ramesh, MD         Objective: Vital signs in last 24 hours: Temp:  [98.3 F (36.8 C)-98.7 F (37.1 C)] 98.3 F (36.8 C) (01/18 0509) Pulse Rate:  [65-91] 68 (01/18 0509) Resp:  [10-23] 16 (01/18 0509) BP: (123-192)/(69-120) 134/69 (01/18 0509) SpO2:  [94 %-100 %] 97 % (01/18 0509) Weight:  [58.7 kg] 58.7 kg (01/17 1826)  Intake/Output from previous day: 01/17 0701 - 01/18 0700 In: 0  Out: 1250 [Urine:1250] Intake/Output this shift: No intake/output data recorded.   Physical Exam Vitals reviewed.  Constitutional:      Appearance: Normal appearance.  Neurological:     Mental Status: He is alert.    Lab Results:  Recent Labs    03/12/21 0306 03/13/21 0251  WBC 10.7* 10.2  HGB 11.3* 10.9*  HCT 35.4* 33.8*  PLT 277 235    BMET Recent Labs    03/12/21 0306 03/13/21 0251  NA 138 135  K 3.6 3.9  CL 103 104  CO2 22 22  GLUCOSE 127* 114*  BUN 22 19  CREATININE 1.15 1.22  CALCIUM 8.7* 8.4*    PT/INR Recent Labs    03/11/21 1908  LABPROT 12.5  INR 0.9    ABG No results for input(s): PHART, HCO3 in the last 72 hours.  Invalid input(s): PCO2, PO2  Studies/Results: CT ABDOMEN PELVIS W CONTRAST  Result Date: 03/12/2021 CLINICAL DATA:  History of prostate cancer, follow-up EXAM: CT ABDOMEN AND PELVIS WITH CONTRAST TECHNIQUE: Multidetector CT imaging of the abdomen and pelvis was performed using the standard protocol following bolus administration of intravenous contrast. RADIATION DOSE REDUCTION: This exam was performed according to the departmental dose-optimization program which includes automated exposure control, adjustment of the mA and/or kV according to patient size and/or use of iterative  reconstruction technique. CONTRAST:  34mL OMNIPAQUE IOHEXOL 300 MG/ML  SOLN COMPARISON:  CT abdomen and pelvis 04/30/2020 FINDINGS: Lower chest: Emphysematous changes of the visualized lower lungs. Thin pleural calcifications posteriorly in the right lower lobe. Hepatobiliary: No focal liver abnormality is seen. No gallstones, gallbladder wall thickening, or biliary dilatation. Pancreas: 1.3 cm mildly hypodense mass in the pancreatic head, stable in size since previous studies including a previous MRI. Stable mild pancreatic ductal dilatation measuring up to 4 mm in diameter in the pancreatic head. Spleen: Normal in size without focal abnormality. Adrenals/Urinary Tract: Adrenal glands are normal. Stable size of an exophytic enhancing mass at the lateral right kidney measuring 2.5 x 1.7 x 2.4 cm. Several small hypodense renal cortical cysts bilaterally. No hydronephrosis. Urinary bladder contains a catheter and demonstrates diffuse wall thickening with no focal mass appreciated. Stomach/Bowel: No bowel obstruction, free air or pneumatosis. Extensive colonic diverticulosis. No bowel wall edema identified. Vascular/Lymphatic: Interval enlargement of numerous retroperitoneal lymph nodes in the abdomen and pelvis with the largest nodes measuring 2.8 x 1.8 cm posterior to the aorta at the level of the lower pole of the kidneys, and 3.2 x 2 cm in the left pelvis region of the distal external iliac chain. Also interval enlargement of several prominent left inguinal lymph nodes measuring up to 2.1 x 1.3 cm. Moderate to severe atherosclerotic disease noted. Reproductive: Prostatectomy surgical changes as well as evidence of bilateral pelvic lymph node dissection. Penile implant with reservoir in the anterior lower abdomen/pelvis noted. Other: No ascites. Musculoskeletal: Severe degenerative changes of the visualized spine. No suspicious bony lesions identified. IMPRESSION: 1. Interval development of bulky retroperitoneal  lymphadenopathy and left inguinal adenopathy. Concerning for metastasis. 2. Stable size of an enhancing exophytic mass at the lateral right kidney, again concerning for renal cell carcinoma. 3. Stable hypodense mass in the pancreatic head, as seen on previous studies. 4. Prostatectomy and pelvic lymph node dissection surgical changes. Urinary bladder wall thickening and urinary bladder catheter. 5. Other ancillary findings as described. Electronically Signed   By: Ofilia Neas M.D.   On: 03/12/2021 10:25     Assessment and Plan: Gross hematuria.  The urine has cleared.  Urine culture is negative.    Urethral stricture and bladder neck contracture.  He will probably be best served with LT foley catheter drainage.  he bladder neck contracture could be secondary to recurrent prostate cancer.  Prostate cancer.  His PSA is up to 182 from about 7 3 years ago and he has  probable lymph node mets on CT but no bone lesions are evidence.   Since he doesn't have evidence of local recurrence.   He could return to his primary urologist, Dr. Tresa Endo with [facility] to discuss further treatment with hormone suppression.       LOS: 1 day    Irine Seal 03/13/2021 [phone] Patient ID: Andre Ard., male   DOB: 1929/06/21, 88 y.o.   MRN: [id number] Patient ID: Andre Mccardle., male   DOB: 1929-09-22, 88 y.o.   MRN: [id number]

## 2021-03-13 NOTE — Progress Notes (Incomplete)
Initial Nutrition Assessment  DOCUMENTATION CODES:      INTERVENTION:  ***   NUTRITION DIAGNOSIS:     related to   as evidenced by  .  GOAL:      MONITOR:      REASON FOR ASSESSMENT:   Malnutrition Screening Tool    ASSESSMENT:   Pt with PMH significant for  prostate cancer s/p radical prostatectomy in 1992 and chemical recurrence in 1994, urethral stricture, CAD, and HTN now presenting with gross hematuria, difficulty voiding, and pain.  ***  Limited recent weight history available for review; however, weight appears stable based on available weight readings.   PO Intake: 100% x 1 recorded meal   UOP: 1217ml x24 hours I/O: -966ml since admit  Medications: Scheduled Meds:  acidophilus  1 capsule Oral q morning   amLODipine  5 mg Oral Daily   atorvastatin  40 mg Oral Daily   carvedilol  6.25 mg Oral BID WC   Chlorhexidine Gluconate Cloth  6 each Topical Daily   lisinopril  20 mg Oral Daily   And   hydrochlorothiazide  12.5 mg Oral Daily   memantine  10 mg Oral BID   multivitamin with minerals  1 tablet Oral q morning  Continuous Infusions:  cefTRIAXone (ROCEPHIN)  IV 2 g (03/12/21 2046)    Labs: Recent Labs  Lab 03/11/21 1908 03/12/21 0306 03/13/21 0251  NA 137 138 135  K 4.1 3.6 3.9  CL 103 103 104  CO2 19* 22 22  BUN 24* 22 19  CREATININE 1.28* 1.15 1.22  CALCIUM 9.5 8.7* 8.4*  MG  --  2.2  --   GLUCOSE 115* 127* 114*  CBGs: *** x24 hours    NUTRITION - FOCUSED PHYSICAL EXAM:  {RD Focused Exam List:21252}  Diet Order:   Diet Order             Diet Heart Room service appropriate? Yes; Fluid consistency: Thin  Diet effective now                   EDUCATION NEEDS:      Skin:  Skin Assessment: Reviewed RN Assessment  Last BM:  1/16  Height:   Ht Readings from Last 1 Encounters:  03/12/21 5\' 8"  (1.727 m)    Weight:   Wt Readings from Last 1 Encounters:  03/12/21 58.7 kg    BMI:  Body mass index is 19.68  kg/m.  Estimated Nutritional Needs:   Kcal:  1800-2000  Protein:  90-100 grams  Fluid:  >1.8L/d     Theone Stanley., MS, RD, LDN (she/her/hers) RD pager number and weekend/on-call pager number located in [place].

## 2021-03-13 NOTE — Discharge Summary (Signed)
Physician Discharge Summary  Andre Casey. GMW:[id number] DOB: 29-Sep-1929 DOA: 03/11/2021  PCP: [name], MD  Admit date: 03/11/2021 Discharge date: 03/13/2021  Admitted From: home Disposition:  home  Recommendations for Outpatient Follow-up:  Follow up with PCP in 1-2 weeks Please obtain BMP/CBC in one week Please follow up on the following pending results:  Home Health:no  Equipment/Devices: none  Discharge Condition: Stable Code Status:   Code Status: DNR Diet recommendation:  Diet Order             Diet Heart Room service appropriate? Yes; Fluid consistency: Thin  Diet effective now                   Brief/Interim Summary: 88 y.o. male with PMH of  prostate cancer status post radical prostatectomy in 1992 and chemical recurrence in 1994, urethral stricture, coronary artery disease, and hypertension, now presenting with gross hematuria, difficulty voiding, and pain. He was seen in the ED found to have uncontrolled hypertension, labs showed creatinine 1.2 CBC unremarkable attempts to place urethral catheter were unsuccessful in the ED urology was consulted and performed cystoscopy with dilation of urethral stricture and bladder neck contracture placed in a catheter and evacuated of clot.  Patient is admitted for further management initially with Foley irrigation. Events WBC 0-5 RBC more than 50 leukocyte moderate w/ positive nitrite Patient was admitted and managed with IV antibiotics, underwent CT scan that showed bulky retroperitoneal adenopathy consistent with metastatic prostate cancer, stable right adrenal mass no bone lesion no  local recurrence. He will continue with Foley catheter at home discussed with Dr. Jeffie Pollock and he will need to follow-up with outpatient neurology for further management of his metastatic prostate cancer and long-term Foley catheterization. Urine culture has remained negative  Discharge Diagnoses:   Gross hematuria with clot  retention Urethral stricture with bladder neck contracture Metastatic prostate cancer: Underwent cystoscopy, dilation of urethral stricture and bladder neck, has a Foley in place urine is clearing up will need long-term Foley catheter placement.  Discussed with urology. underwent , CT scan that showed bulky retroperitoneal adenopathy consistent with metastatic prostate cancer, stable right adrenal mass no bone lesion no  local recurrence. He will continue with Foley catheter at home discussed with Dr. Jeffie Pollock and he will need to follow-up with outpatient neurology for further management of his metastatic prostate cancer and long-term Foley catheterization.Urine culture has remained negative   Mild cognitive impairment: Mentation is stable continue home meds.     Hypertensive urgency blood pressure poorly controlled on admission at this time improved on home  Norvasc Coreg, resume Lisinopril -HCTZ  as creat stable   Acute renal insufficiency: resolved at 1.2. CAD: History of 3 stents in 2017, no anginal symptoms. Resume  Aspirin in 4 days as per urology given hematuria. cont coreg.  Discussed plan of care with neurology okay for discharge home today.  Consults: Urology   Subjective: Seen and examined this morning.  Alert awake oriented no new complaints doing well.  Would like to go home today.  Discharge Exam: Vitals:   03/13/21 0509 03/13/21 0822  BP: 134/69 137/72  Pulse: 68 74  Resp: 16 18  Temp: 98.3 F (36.8 C) 98.2 F (36.8 C)  SpO2: 97% 97%   General: Pt is alert, awake, not in acute distress Cardiovascular: RRR, S1/S2 +, no rubs, no gallops Respiratory: CTA bilaterally, no wheezing, no rhonchi Abdominal: Soft, NT, ND, bowel sounds + Extremities: no edema, no cyanosis  Discharge  Instructions  Discharge Instructions     Discharge instructions   Complete by: As directed    CBC BMP in 1 week.  Please follow-up with your urology Dr. Rosana Hoes in a week  Please call call  MD or return to ER for similar or worsening recurring problem that brought you to hospital or if any fever,nausea/vomiting,abdominal pain, uncontrolled pain, chest pain,  shortness of breath or any other alarming symptoms.  Please follow-up your doctor as instructed in a week time and call the office for appointment.  Please avoid alcohol, smoking, or any other illicit substance and maintain healthy habits including taking your regular medications as prescribed.  You were cared for by a hospitalist during your hospital stay. If you have any questions about your discharge medications or the care you received while you were in the hospital after you are discharged, you can call the unit and ask to speak with the hospitalist on call if the hospitalist that took care of you is not available.  Once you are discharged, your primary care physician will handle any further medical issues. Please note that NO REFILLS for any discharge medications will be authorized once you are discharged, as it is imperative that you return to your primary care physician (or establish a relationship with a primary care physician if you do not have one) for your aftercare needs so that they can reassess your need for medications and monitor your lab values   Increase activity slowly   Complete by: As directed       Allergies as of 03/13/2021       Reactions   Donepezil Nausea Only   Report upset stomach - unable to tolerate.   Namzaric [memantine Hcl-donepezil Hcl] Other (See Comments)   Stomach upset - pt would like to try lower doses of medications in separate prescriptions (note in Epic). 03/12/21 - pt is currently taking memantine        Medication List     TAKE these medications    acetaminophen 500 MG tablet Commonly known as: TYLENOL Take 1,000 mg by mouth every 8 (eight) hours as needed for mild pain or headache.   amLODipine 5 MG tablet Commonly known as: NORVASC TAKE 1 TABLET BY MOUTH ONCE DAILY    aspirin EC 81 MG tablet Take 1 tablet (81 mg total) by mouth daily. Swallow whole. Start taking on: March 17, 2021 What changed: These instructions start on March 17, 2021. If you are unsure what to do until then, ask your doctor or other care provider.   carvedilol 6.25 MG tablet Commonly known as: COREG Take 1 tablet (6.25 mg total) by mouth 2 (two) times daily with a meal.   lisinopril-hydrochlorothiazide 20-12.5 MG tablet Commonly known as: ZESTORETIC Take 1 tablet by mouth every morning.   MAGNESIUM PO Take 1 tablet by mouth See admin instructions. Take one tablet by mouth every Monday, Wednesday, Friday night   memantine 10 MG tablet Commonly known as: Namenda Take 1 tablet (10 mg total) by mouth 2 (two) times daily.   PreserVision AREDS 2 Caps Take 1 capsule by mouth 2 (two) times daily.   multivitamin with minerals tablet Take 1 tablet by mouth every morning.   POTASSIUM PO Take 1 tablet by mouth at bedtime.   PROBIOTIC PO Take 1 tablet by mouth every morning.   traMADol 50 MG tablet Commonly known as: ULTRAM Take 1 tablet (50 mg total) by mouth every 6 (six) hours as needed.   VITAMIN  C PO Take 1 tablet by mouth every morning.        Follow-up Information     [name], MD Follow up in 1 week(s).   Specialty: Family Medicine Contact information: [address] [place] [address] [phone]         Lelon Perla, MD .   Specialty: Cardiology Contact information: [address] [place] [address] [phone]         [name], MD Follow up in 1 week(s).   Specialty: Urology Why: Regarding your prostate cancer, Foley catheter plan please call his office for follow-up as soon as possible.  you will also need bone scan. Contact information: [address] [place]  [address] [phone]                Allergies  Allergen Reactions   Donepezil Nausea Only     Report upset stomach - unable to tolerate.   Namzaric [Memantine Hcl-Donepezil Hcl] Other (See Comments)    Stomach upset - pt would like to try lower doses of medications in separate prescriptions (note in Epic). 03/12/21 - pt is currently taking memantine    The results of significant diagnostics from this hospitalization (including imaging, microbiology, ancillary and laboratory) are listed below for reference.    Microbiology: Recent Results (from the past 240 hour(s))  Urine Culture     Status: None   Collection Time: 03/11/21 10:50 PM   Specimen: Urine, Catheterized  Result Value Ref Range Status   Specimen Description URINE, CATHETERIZED  Final   Special Requests NONE  Final   Culture   Final    NO GROWTH Performed at [facility], [address] [place],  [address]    Report Status 03/12/2021 FINAL  Final    Procedures/Studies: CT ABDOMEN PELVIS W CONTRAST  Result Date: 03/12/2021 CLINICAL DATA:  History of prostate cancer, follow-up EXAM: CT ABDOMEN AND PELVIS WITH CONTRAST TECHNIQUE: Multidetector CT imaging of the abdomen and pelvis was performed using the standard protocol following bolus administration of intravenous contrast. RADIATION DOSE REDUCTION: This exam was performed according to the departmental dose-optimization program which includes automated exposure control, adjustment of the mA and/or kV according to patient size and/or use of iterative reconstruction technique. CONTRAST:  35mL OMNIPAQUE IOHEXOL 300 MG/ML  SOLN COMPARISON:  CT abdomen and pelvis 04/30/2020 FINDINGS: Lower chest: Emphysematous changes of the visualized lower lungs. Thin pleural calcifications posteriorly in the right lower lobe. Hepatobiliary: No focal liver abnormality is seen. No gallstones, gallbladder wall thickening, or biliary dilatation. Pancreas: 1.3 cm mildly hypodense mass in the pancreatic head, stable in size since previous studies including a previous MRI. Stable mild  pancreatic ductal dilatation measuring up to 4 mm in diameter in the pancreatic head. Spleen: Normal in size without focal abnormality. Adrenals/Urinary Tract: Adrenal glands are normal. Stable size of an exophytic enhancing mass at the lateral right kidney measuring 2.5 x 1.7 x 2.4 cm. Several small hypodense renal cortical cysts bilaterally. No hydronephrosis. Urinary bladder contains a catheter and demonstrates diffuse wall thickening with no focal mass appreciated. Stomach/Bowel: No bowel obstruction, free air or pneumatosis. Extensive colonic diverticulosis. No bowel wall edema identified. Vascular/Lymphatic: Interval enlargement of numerous retroperitoneal lymph nodes in the abdomen and pelvis with the largest nodes measuring 2.8 x 1.8 cm posterior to the aorta at the level of the lower pole of the kidneys, and 3.2 x 2 cm in the left pelvis region  of the distal external iliac chain. Also interval enlargement of several prominent left inguinal lymph nodes measuring up to 2.1 x 1.3 cm. Moderate to severe atherosclerotic disease noted. Reproductive: Prostatectomy surgical changes as well as evidence of bilateral pelvic lymph node dissection. Penile implant with reservoir in the anterior lower abdomen/pelvis noted. Other: No ascites. Musculoskeletal: Severe degenerative changes of the visualized spine. No suspicious bony lesions identified. IMPRESSION: 1. Interval development of bulky retroperitoneal lymphadenopathy and left inguinal adenopathy. Concerning for metastasis. 2. Stable size of an enhancing exophytic mass at the lateral right kidney, again concerning for renal cell carcinoma. 3. Stable hypodense mass in the pancreatic head, as seen on previous studies. 4. Prostatectomy and pelvic lymph node dissection surgical changes. Urinary bladder wall thickening and urinary bladder catheter. 5. Other ancillary findings as described. Electronically Signed   By: Ofilia Neas M.D.   On: 03/12/2021 10:25     Labs: BNP (last 3 results) No results for input(s): BNP in the last 8760 hours. Basic Metabolic Panel: Recent Labs  Lab 03/11/21 1908 03/12/21 0306 03/13/21 0251  NA 137 138 135  K 4.1 3.6 3.9  CL 103 103 104  CO2 19* 22 22  GLUCOSE 115* 127* 114*  BUN 24* 22 19  CREATININE 1.28* 1.15 1.22  CALCIUM 9.5 8.7* 8.4*  MG  --  2.2  --    Liver Function Tests: No results for input(s): AST, ALT, ALKPHOS, BILITOT, PROT, ALBUMIN in the last 168 hours. No results for input(s): LIPASE, AMYLASE in the last 168 hours. No results for input(s): AMMONIA in the last 168 hours. CBC: Recent Labs  Lab 03/11/21 1908 03/12/21 0306 03/13/21 0251  WBC 10.4 10.7* 10.2  NEUTROABS 6.6  --   --   HGB 13.3 11.3* 10.9*  HCT 40.4 35.4* 33.8*  MCV 90.8 92.4 91.8  PLT 317 277 235   Cardiac Enzymes: No results for input(s): CKTOTAL, CKMB, CKMBINDEX, TROPONINI in the last 168 hours. BNP: Invalid input(s): POCBNP CBG: No results for input(s): GLUCAP in the last 168 hours. D-Dimer No results for input(s): DDIMER in the last 72 hours. Hgb A1c No results for input(s): HGBA1C in the last 72 hours. Lipid Profile No results for input(s): CHOL, HDL, LDLCALC, TRIG, CHOLHDL, LDLDIRECT in the last 72 hours. Thyroid function studies No results for input(s): TSH, T4TOTAL, T3FREE, THYROIDAB in the last 72 hours.  Invalid input(s): FREET3 Anemia work up No results for input(s): VITAMINB12, FOLATE, FERRITIN, TIBC, IRON, RETICCTPCT in the last 72 hours. Urinalysis    Component Value Date/Time   COLORURINE RED (A) 03/12/2021 0005   APPEARANCEUR TURBID (A) 03/12/2021 0005   LABSPEC 1.010 03/12/2021 0005   PHURINE 7.0 03/12/2021 0005   GLUCOSEU 250 (A) 03/12/2021 0005   HGBUR LARGE (A) 03/12/2021 0005   BILIRUBINUR NEGATIVE 03/12/2021 0005   KETONESUR 15 (A) 03/12/2021 0005   PROTEINUR >300 (A) 03/12/2021 0005   UROBILINOGEN 0.2 05/15/2007 1118   NITRITE POSITIVE (A) 03/12/2021 0005   LEUKOCYTESUR  MODERATE (A) 03/12/2021 0005   Sepsis Labs Invalid input(s): PROCALCITONIN,  WBC,  LACTICIDVEN Microbiology Recent Results (from the past 240 hour(s))  Urine Culture     Status: None   Collection Time: 03/11/21 10:50 PM   Specimen: Urine, Catheterized  Result Value Ref Range Status   Specimen Description URINE, CATHETERIZED  Final   Special Requests NONE  Final   Culture   Final    NO GROWTH Performed at [facility], [address],  [place], [place] [address]    Report Status 03/12/2021 FINAL  Final     Time coordinating discharge: 25 minutes  SIGNED: Antonieta Pert, MD  [facility] 03/13/2021, 10:33 AM  If 7PM-7AM, please contact night-coverage [email or web address]

## 2021-03-13 NOTE — Progress Notes (Signed)
Patient provided education on foley catheter care and maintenance. Family member to come to bedside for additional treatment. Print out placed in discharge packet.

## 2021-04-08 ENCOUNTER — Encounter: Payer: Self-pay | Admitting: Neurology

## 2021-04-08 ENCOUNTER — Ambulatory Visit (INDEPENDENT_AMBULATORY_CARE_PROVIDER_SITE_OTHER): Payer: Medicare HMO | Admitting: Neurology

## 2021-04-08 ENCOUNTER — Telehealth: Payer: Self-pay | Admitting: Neurology

## 2021-04-08 VITALS — BP 166/72 | HR 83 | Ht 68.0 in | Wt 131.0 lb

## 2021-04-08 DIAGNOSIS — G301 Alzheimer's disease with late onset: Secondary | ICD-10-CM | POA: Diagnosis not present

## 2021-04-08 DIAGNOSIS — R269 Unspecified abnormalities of gait and mobility: Secondary | ICD-10-CM | POA: Diagnosis not present

## 2021-04-08 DIAGNOSIS — F02B0 Dementia in other diseases classified elsewhere, moderate, without behavioral disturbance, psychotic disturbance, mood disturbance, and anxiety: Secondary | ICD-10-CM

## 2021-04-08 MED ORDER — MEMANTINE HCL 10 MG PO TABS: 10.0000 mg | ORAL_TABLET | Freq: Two times a day (BID) | ORAL | 4 refills | Status: AC
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
## 2021-04-08 NOTE — Telephone Encounter (Signed)
Sent a message to [place] with [facility] to see if she would be able to take the patient.

## 2021-04-09 ENCOUNTER — Encounter (HOSPITAL_COMMUNITY): Payer: Self-pay | Admitting: Radiology

## 2021-04-09 NOTE — Telephone Encounter (Signed)
[place] stated I am going to send this one to [facility] due to the insurance. Janace Hoard has accepted

## 2021-04-10 NOTE — Telephone Encounter (Signed)
Angie with [facility] sent me a message stating:  "Hey Emily--we can see him - he has orders for SN PT HH aide--we do not have an aide right now to help him--are you ok if we provide the SN and PT and work on what he needs help with where it comes to dressing and bathing ?"  Please advise, thank you.

## 2021-04-10 NOTE — Telephone Encounter (Signed)
It is Ok to only keep SN, PT,

## 2021-04-10 NOTE — Telephone Encounter (Signed)
Noted, I sent a message to Angie with [facility]. She is aware that Dr. Krista Blue is okay with keeping SN, PT.

## 2021-04-15 ENCOUNTER — Emergency Department (HOSPITAL_COMMUNITY)
Admission: EM | Admit: 2021-04-15 | Discharge: 2021-04-16 | Disposition: A | Discharge status: Home / Self Care | Payer: Medicare HMO | Attending: Emergency Medicine | Admitting: Emergency Medicine

## 2021-04-15 ENCOUNTER — Encounter (HOSPITAL_COMMUNITY): Payer: Self-pay

## 2021-04-15 ENCOUNTER — Other Ambulatory Visit: Payer: Self-pay

## 2021-04-15 ENCOUNTER — Emergency Department (HOSPITAL_COMMUNITY): Payer: Medicare HMO

## 2021-04-15 DIAGNOSIS — D72829 Elevated white blood cell count, unspecified: Secondary | ICD-10-CM | POA: Diagnosis not present

## 2021-04-15 DIAGNOSIS — Z960 Presence of urogenital implants: Secondary | ICD-10-CM | POA: Insufficient documentation

## 2021-04-15 DIAGNOSIS — Z85828 Personal history of other malignant neoplasm of skin: Secondary | ICD-10-CM | POA: Insufficient documentation

## 2021-04-15 DIAGNOSIS — I2510 Atherosclerotic heart disease of native coronary artery without angina pectoris: Secondary | ICD-10-CM | POA: Insufficient documentation

## 2021-04-15 DIAGNOSIS — Z79899 Other long term (current) drug therapy: Secondary | ICD-10-CM | POA: Diagnosis not present

## 2021-04-15 DIAGNOSIS — Z978 Presence of other specified devices: Secondary | ICD-10-CM | POA: Diagnosis not present

## 2021-04-15 DIAGNOSIS — G8929 Other chronic pain: Secondary | ICD-10-CM | POA: Insufficient documentation

## 2021-04-15 DIAGNOSIS — M542 Cervicalgia: Secondary | ICD-10-CM | POA: Insufficient documentation

## 2021-04-15 DIAGNOSIS — F0390 Unspecified dementia without behavioral disturbance: Secondary | ICD-10-CM | POA: Insufficient documentation

## 2021-04-15 DIAGNOSIS — I10 Essential (primary) hypertension: Secondary | ICD-10-CM | POA: Insufficient documentation

## 2021-04-15 DIAGNOSIS — C7989 Secondary malignant neoplasm of other specified sites: Secondary | ICD-10-CM | POA: Diagnosis not present

## 2021-04-15 DIAGNOSIS — U071 COVID-19: Secondary | ICD-10-CM | POA: Diagnosis not present

## 2021-04-15 DIAGNOSIS — R051 Acute cough: Secondary | ICD-10-CM | POA: Diagnosis present

## 2021-04-15 DIAGNOSIS — Z7982 Long term (current) use of aspirin: Secondary | ICD-10-CM | POA: Insufficient documentation

## 2021-04-15 DIAGNOSIS — G301 Alzheimer's disease with late onset: Secondary | ICD-10-CM | POA: Diagnosis not present

## 2021-04-15 DIAGNOSIS — F02B0 Dementia in other diseases classified elsewhere, moderate, without behavioral disturbance, psychotic disturbance, mood disturbance, and anxiety: Secondary | ICD-10-CM | POA: Diagnosis present

## 2021-04-15 DIAGNOSIS — C61 Malignant neoplasm of prostate: Secondary | ICD-10-CM

## 2021-04-15 LAB — BASIC METABOLIC PANEL
Anion gap: 13 (ref 5–15)
BUN: 30 mg/dL — ABNORMAL HIGH (ref 8–23)
CO2: 20 mmol/L — ABNORMAL LOW (ref 22–32)
Calcium: 8.8 mg/dL — ABNORMAL LOW (ref 8.9–10.3)
Chloride: 100 mmol/L (ref 98–111)
Creatinine, Ser: 1.21 mg/dL (ref 0.61–1.24)
GFR, Estimated: 57 mL/min — ABNORMAL LOW (ref >60)
Glucose, Bld: 197 mg/dL — ABNORMAL HIGH (ref 70–99)
Potassium: 3.8 mmol/L (ref 3.5–5.1)
Sodium: 133 mmol/L — ABNORMAL LOW (ref 135–145)

## 2021-04-15 LAB — CBC WITH DIFFERENTIAL/PLATELET
Abs Immature Granulocytes: 0.14 10*3/uL — ABNORMAL HIGH (ref 0.00–0.07)
Basophils Absolute: 0 10*3/uL (ref 0.0–0.1)
Basophils Relative: 0 %
Eosinophils Absolute: 0 10*3/uL (ref 0.0–0.5)
Eosinophils Relative: 0 %
HCT: 38.3 % — ABNORMAL LOW (ref 39.0–52.0)
Hemoglobin: 12.7 g/dL — ABNORMAL LOW (ref 13.0–17.0)
Immature Granulocytes: 1 %
Lymphocytes Relative: 5 %
Lymphs Abs: 0.7 10*3/uL (ref 0.7–4.0)
MCH: 29.4 pg (ref 26.0–34.0)
MCHC: 33.2 g/dL (ref 30.0–36.0)
MCV: 88.7 fL (ref 80.0–100.0)
Monocytes Absolute: 1.4 10*3/uL — ABNORMAL HIGH (ref 0.1–1.0)
Monocytes Relative: 9 %
Neutro Abs: 12.4 10*3/uL — ABNORMAL HIGH (ref 1.7–7.7)
Neutrophils Relative %: 85 %
Platelets: 441 10*3/uL — ABNORMAL HIGH (ref 150–400)
RBC: 4.32 MIL/uL (ref 4.22–5.81)
RDW: 13.3 % (ref 11.5–15.5)
WBC: 14.6 10*3/uL — ABNORMAL HIGH (ref 4.0–10.5)
nRBC: 0 % (ref 0.0–0.2)

## 2021-04-15 LAB — URINALYSIS, ROUTINE W REFLEX MICROSCOPIC
Bilirubin Urine: NEGATIVE
Glucose, UA: NEGATIVE mg/dL
Hgb urine dipstick: NEGATIVE
Ketones, ur: 20 mg/dL — AB
Nitrite: NEGATIVE
Protein, ur: >=300 mg/dL — AB
Specific Gravity, Urine: 1.02 (ref 1.005–1.030)
pH: 8 (ref 5.0–8.0)

## 2021-04-15 LAB — RESP PANEL BY RT-PCR (FLU A&B, COVID) ARPGX2
Influenza A by PCR: NEGATIVE
Influenza B by PCR: NEGATIVE
SARS Coronavirus 2 by RT PCR: POSITIVE — AB

## 2021-04-15 MED ORDER — MORPHINE SULFATE 15 MG PO TABS: 7.5000 mg | ORAL_TABLET | ORAL | 0 refills | Status: DC | PRN

## 2021-04-15 MED ORDER — LACTATED RINGERS IV BOLUS
1000.0000 mL | Freq: Once | INTRAVENOUS | Status: AC
  Administered 2021-04-15: 1000 mL via INTRAVENOUS

## 2021-04-15 MED ORDER — NIRMATRELVIR/RITONAVIR (PAXLOVID) TABLET (RENAL DOSING)
2.0000 | ORAL_TABLET | Freq: Two times a day (BID) | ORAL | Status: DC
  Administered 2021-04-15: 2 via ORAL
  Filled 2021-04-15: qty 20

## 2021-04-15 MED ORDER — IOHEXOL 300 MG/ML  SOLN
75.0000 mL | Freq: Once | INTRAMUSCULAR | Status: AC | PRN
  Administered 2021-04-15: 75 mL via INTRAVENOUS

## 2021-04-15 MED ORDER — SODIUM CHLORIDE 0.9 % IV SOLN
1.0000 g | Freq: Once | INTRAVENOUS | Status: DC
  Filled 2021-04-15: qty 10

## 2021-04-15 MED ORDER — ACETAMINOPHEN 325 MG PO TABS
650.0000 mg | ORAL_TABLET | Freq: Once | ORAL | Status: AC
  Administered 2021-04-15: 650 mg via ORAL
  Filled 2021-04-15: qty 2

## 2021-04-15 MED ORDER — MORPHINE SULFATE (PF) 4 MG/ML IV SOLN
4.0000 mg | Freq: Once | INTRAVENOUS | Status: AC
  Administered 2021-04-15: 4 mg via INTRAVENOUS
  Filled 2021-04-15: qty 1

## 2021-04-15 MED ORDER — NIRMATRELVIR/RITONAVIR (PAXLOVID) TABLET (RENAL DOSING): 2.0000 | ORAL_TABLET | Freq: Two times a day (BID) | ORAL | 0 refills | Status: DC

## 2021-04-15 MED ORDER — LACTATED RINGERS IV SOLN: INTRAVENOUS | Status: DC

## 2021-04-15 MED ORDER — KETOROLAC TROMETHAMINE 15 MG/ML IJ SOLN
15.0000 mg | Freq: Once | INTRAMUSCULAR | Status: AC
  Administered 2021-04-15: 15 mg via INTRAVENOUS
  Filled 2021-04-15: qty 1

## 2021-04-15 NOTE — Assessment & Plan Note (Signed)
MoCA examination 17/30 on 04-08-2021 at neurologist office. Pt unable to completed ADLs at home without assistance. Needs help with bathing, eating, dressing, ambulation.

## 2021-04-15 NOTE — Assessment & Plan Note (Signed)
Pt without hypoxia. Does not require hospitalization.
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
## 2021-04-15 NOTE — Subjective & Objective (Signed)
CC: cough HPI: 88 year old white male with a history of metastatic prostate cancer, chronic indwelling Foley catheter, advanced Alzheimer type dementia, presents to the ER today with 3 days of mild cough.  He tested positive breath at home COVID test 3 days ago.  Patient's wife is admitted to the hospital for [place].  Stepdaughter is with him.  Patient lives at home with his wife.  They have caregivers 12 hours a day.  Patient's wife.  This is paid for by the wife long-term care insurance.  Stepdaughter states that care.  The patient's wife had COVID 2 weeks ago.  Wife contracted COVID 1 week ago.  Patient contracted COVID 3 days ago.  Had a mild cough.  Patient brought to ER for further evaluation.  Patient's had increasing neck pain.  He has a diagnosis of metastatic prostate cancer.  In the ER, temperature 98, heart rate 95, blood pressure 154/97.  Satting 100% on room air.  White count 14.6, hemoglobin 12.7, platelets 441.  Chemistry sodium 133, BUN of 30, creatinine 1.2  COVID test was positive.  [facility] hospitalist contacted to evaluate the patient.  Patient does not feel any criteria for hospitalization.  Patient feels better after IV morphine.  He likely has metastatic disease to the bone.  Stepdaughter comfortable taking the patient home.  Neurology is ready ordered home health which did call the step daughter today to arrange for meeting.  Stepdaughter has a wheelchair at home.  Patient stable for medical discharge.

## 2021-04-15 NOTE — ED Triage Notes (Addendum)
Patient had a positive Covid test  x 3 days. Patient c/o  right lateral neck pain.Patient's relative reports that this is an old problem. Patient's step daughter reports that the patient has not eaten x 3 days. Relative reports no fever or cough at his time, but states he had a little bit of a cough last week. Patient denies any chest pain or emesis. Relative reports that the patient ws SOB when he changed his clothes.    Patient's daughter-in -law wants to be called when the patient is discharged so she can pick him up.

## 2021-04-15 NOTE — Assessment & Plan Note (Signed)
Seen by urology this week. Referred to oncology. Step dtr states pt is in significant amount of pain. Received IV morphine in ER with good relief. Given his metastatic prostate cancer and limited life expectancy, will give him small supply of MS-IR 7.5-15 mg q4h prn until he can been seen by oncology. Discussed with step-dtr that pt would benefit from enrolling into hospice care. Step-dtr states family is trying to get pt's long-term care insurance to kick in.

## 2021-04-15 NOTE — Assessment & Plan Note (Signed)
Changed on 04-04-2021 at urology office.

## 2021-04-15 NOTE — ED Notes (Signed)
Patients daughter called for an update: Vinnie Level [phone]

## 2021-04-15 NOTE — ED Provider Triage Note (Signed)
Emergency Medicine Provider Triage Evaluation Note  Level 5 caveat: Dementia  Andre Ard. , a 88 y.o. male  was evaluated in triage.  Pt complains of lateral neck pain.  His neck pain is not new for him he is currently being evaluated with an orthopedist for this.  Patient had a positive COVID test 4 days ago.  His wife is currently hospitalized with COVID. Denies chest pain, shortness of breath, abdominal pain, nausea, vomiting, dysuria, hematuria, fever, chills, cough, rhinorrhea, nasal congestion.  Patient is wife is currently hospitalized with COVID.  Patient stepdaughter with concerns of patient not eating well and overall denying all symptoms.  Patient stepdaughter also with concerns for shortness of breath that she noticed when the patient was putting on his clothes.  Review of Systems  Positive: As per HPI above Negative:   Physical Exam  BP (!) 154/97 (BP Location: Right Arm)    Pulse 95    Temp 98 F (36.7 C) (Oral)    Resp 18    SpO2 100%  Gen:   Awake, no distress   Resp:  Normal effort  MSK:   Moves extremities without difficulty  Other:  No focal neurological deficits.  No C, T, L, S spinal tenderness to palpation.  No cervical musculature tenderness to palpation or overlying skin changes.  Medical Decision Making  Medically screening exam initiated at 2:11 PM.  Appropriate orders placed.  Andre Ard. was informed that the remainder of the evaluation will be completed by another provider, this initial triage assessment does not replace that evaluation, and the importance of remaining in the ED until their evaluation is complete.    Jaime Dome A, PA-C 04/15/21 1416

## 2021-04-15 NOTE — ED Provider Notes (Signed)
[facility] DEPT Provider Note   CSN: [id number] Arrival date & time: 04/15/21  1311     History  Chief Complaint  Patient presents with   Covid Positive   Neck Pain    Andre Casey. is a 88 y.o. male.   Neck Pain Patient presents for decreased p.o. intake and chronic neck pain.  Medical history includes HTN, HLD, metastatic prostate cancer (currently on Lupron), CAD, dementia, GERD, OSA, and chronic Foley.  He tested positive for COVID-19 3 days ago.  Currently, he endorses pain on the lower right lateral aspect of his neck.  He states that this is a chronic pain that has acutely worsened.  He denies any current shortness of breath.  When asked why he was not eating, patient states that he has not had an appetite.  Additional history is provided by his stepdaughter: At baseline, he does walk without cane or walker.  She is unsure of when his Foley catheter was last replaced.  Patient denies any abdominal or suprapubic discomfort.  He did see urology 5 days ago for evaluation of his metastatic prostate cancer.  For pain, at home, he has been taking Tylenol only.    Home Medications Prior to Admission medications   Medication Sig Start Date End Date Taking? Authorizing Provider  morphine (MSIR) 15 MG tablet Take 0.5-1 tablets (7.5-15 mg total) by mouth every 4 (four) hours as needed for up to 5 days for severe pain. 04/15/21 04/20/21 Yes Kristopher Oppenheim, DO  acetaminophen (TYLENOL) 500 MG tablet Take 1,000 mg by mouth every 8 (eight) hours as needed for mild pain or headache.    [provider]  amLODipine (NORVASC) 5 MG tablet TAKE 1 TABLET BY MOUTH ONCE DAILY 01/18/18   Barrett, Evelene Croon, PA-C  Ascorbic Acid (VITAMIN C PO) Take 1 tablet by mouth every morning.    [provider]  aspirin EC 81 MG tablet Take 1 tablet (81 mg total) by mouth daily. Swallow whole. 03/17/21   Antonieta Pert, MD  carvedilol (COREG) 6.25 MG tablet Take 1 tablet  (6.25 mg total) by mouth 2 (two) times daily with a meal. 04/30/16   Crenshaw, Denice Bors, MD  lisinopril-hydrochlorothiazide (ZESTORETIC) 20-12.5 MG tablet Take 1 tablet by mouth every morning.    [provider]  MAGNESIUM PO Take 1 tablet by mouth See admin instructions. Take one tablet by mouth every Monday, Wednesday, Friday night    [provider]  memantine (NAMENDA) 10 MG tablet Take 1 tablet (10 mg total) by mouth 2 (two) times daily. 04/08/21   Marcial Pacas, MD  Multiple Vitamins-Minerals (MULTIVITAMIN WITH MINERALS) tablet Take 1 tablet by mouth every morning.    [provider]  Multiple Vitamins-Minerals (PRESERVISION AREDS 2) CAPS Take 1 capsule by mouth 2 (two) times daily.    [provider]  nirmatrelvir/ritonavir EUA, renal dosing, (PAXLOVID) 10 x 150 MG & 10 x 100MG  TABS Take 2 tablets by mouth 2 (two) times daily for 5 days. Patient GFR is 57. Take nirmatrelvir (150 mg) one tablet twice daily for 5 days and ritonavir (100 mg) one tablet twice daily for 5 days. 04/15/21 04/20/21  Kristopher Oppenheim, DO  POTASSIUM PO Take 1 tablet by mouth at bedtime.    [provider]  Probiotic Product (PROBIOTIC PO) Take 1 tablet by mouth every morning.    [provider]  traMADol (ULTRAM) 50 MG tablet Take 1 tablet (50 mg total) by mouth every  6 (six) hours as needed. 04/29/20   Veryl Speak, MD      Allergies    Donepezil and Namzaric [memantine hcl-donepezil hcl]    Review of Systems   Review of Systems  Constitutional:  Positive for appetite change and fatigue.  Musculoskeletal:  Positive for neck pain.   Physical Exam Updated Vital Signs BP 139/72    Pulse 95    Temp 98 F (36.7 C) (Oral)    Resp 18    Ht 5\' 8"  (1.727 m)    Wt 62.6 kg    SpO2 98%    BMI 20.98 kg/m  Physical Exam Vitals and nursing note reviewed.  Constitutional:      General: He is not in acute distress.    Appearance: He is well-developed and underweight. He is not  ill-appearing, toxic-appearing or diaphoretic.  HENT:     Head: Normocephalic and atraumatic.     Right Ear: External ear normal.     Left Ear: External ear normal.     Nose: Nose normal.     Mouth/Throat:     Mouth: Mucous membranes are moist.     Pharynx: Oropharynx is clear.     Comments: Poor dentition Eyes:     Extraocular Movements: Extraocular movements intact.     Conjunctiva/sclera: Conjunctivae normal.  Neck:   Cardiovascular:     Rate and Rhythm: Normal rate and regular rhythm.     Heart sounds: No murmur heard. Pulmonary:     Effort: Pulmonary effort is normal. No respiratory distress.     Breath sounds: Normal breath sounds. No wheezing or rales.  Chest:     Chest wall: No tenderness.  Abdominal:     General: Abdomen is flat.     Palpations: Abdomen is soft.     Tenderness: There is no abdominal tenderness.  Genitourinary:    Comments: Chronic indwelling Foley in place with leg drainage bag Musculoskeletal:        General: No swelling.     Cervical back: Normal range of motion and neck supple. Tenderness present.     Right lower leg: No edema.     Left lower leg: No edema.  Skin:    General: Skin is warm and dry.     Capillary Refill: Capillary refill takes less than 2 seconds.     Coloration: Skin is not jaundiced or pale.  Neurological:     General: No focal deficit present.     Mental Status: He is alert and oriented to person, place, and time.     Cranial Nerves: No cranial nerve deficit.     Sensory: No sensory deficit.     Motor: No weakness.     Coordination: Coordination normal.  Psychiatric:        Mood and Affect: Mood normal.        Behavior: Behavior normal.        Thought Content: Thought content normal.        Judgment: Judgment normal.    ED Results / Procedures / Treatments   Labs (all labs ordered are listed, but only abnormal results are displayed) Labs Reviewed  RESP PANEL BY RT-PCR (FLU A&B, COVID) ARPGX2 - Abnormal; Notable for  the following components:      Result Value   SARS Coronavirus 2 by RT PCR POSITIVE (*)    All other components within normal limits  BASIC METABOLIC PANEL - Abnormal; Notable for the following components:   Sodium 133 (*)  CO2 20 (*)    Glucose, Bld 197 (*)    BUN 30 (*)    Calcium 8.8 (*)    GFR, Estimated 57 (*)    All other components within normal limits  CBC WITH DIFFERENTIAL/PLATELET - Abnormal; Notable for the following components:   WBC 14.6 (*)    Hemoglobin 12.7 (*)    HCT 38.3 (*)    Platelets 441 (*)    Neutro Abs 12.4 (*)    Monocytes Absolute 1.4 (*)    Abs Immature Granulocytes 0.14 (*)    All other components within normal limits  URINALYSIS, ROUTINE W REFLEX MICROSCOPIC - Abnormal; Notable for the following components:   APPearance CLOUDY (*)    Ketones, ur 20 (*)    Protein, ur >=300 (*)    Leukocytes,Ua LARGE (*)    All other components within normal limits    EKG EKG Interpretation  Date/Time:  Monday April 15 2021 14:29:51 EST Ventricular Rate:  94 PR Interval:  156 QRS Duration: 134 QT Interval:  405 QTC Calculation: 507 R Axis:   8 Text Interpretation: Sinus rhythm Atrial premature complex Left bundle branch block Confirmed by Godfrey Pick [phone]) on 04/15/2021 4:47:00 PM  Radiology DG Chest 2 View  Result Date: 04/15/2021 CLINICAL DATA:  Cough.  COVID positive. EXAM: CHEST - 2 VIEW COMPARISON:  04/30/2020 FINDINGS: The cardiomediastinal silhouette is unchanged with normal heart size. No airspace consolidation, edema, pleural effusion, or pneumothorax is identified. There is a calcified granuloma in the left lung apex. Prior right shoulder arthroplasty is noted. There is mild thoracic levoscoliosis. IMPRESSION: No active cardiopulmonary disease. Electronically Signed   By: Logan Bores M.D.   On: 04/15/2021 15:29   CT Soft Tissue Neck W Contrast  Result Date: 04/15/2021 CLINICAL DATA:  Neck mass, nonpulsatile, COVID positive EXAM: CT NECK WITH  CONTRAST TECHNIQUE: Multidetector CT imaging of the neck was performed using the standard protocol following the bolus administration of intravenous contrast. RADIATION DOSE REDUCTION: This exam was performed according to the departmental dose-optimization program which includes automated exposure control, adjustment of the mA and/or kV according to patient size and/or use of iterative reconstruction technique. CONTRAST:  27mL OMNIPAQUE IOHEXOL 300 MG/ML  SOLN COMPARISON:  None. FINDINGS: Evaluation is somewhat limited by motion, which is most notable at the level of the hyoid. Pharynx and larynx: Evaluation is limited by motion. Within this limitation, the larynx and pharynx appear normal, without mass or swelling. Salivary glands: No inflammation, mass, or stone. Thyroid: Normal. Lymph nodes: None enlarged or abnormal density. Vascular: No acute vascular abnormality.  Aortic atherosclerosis. Limited intracranial: Negative. Visualized orbits: Status post bilateral lens replacements. Otherwise unremarkable. Mastoids and visualized paranasal sinuses: Clear. Skeleton: Severe degenerative changes in the cervical spine, with reversal of the normal cervical lordosis, 4 mm anterolisthesis of C4 on C5, 3 mm retrolisthesis of C5 on C6, 3 mm retrolisthesis of C6 on C7, and 4 mm anterolisthesis of C7 on T1. Degenerative changes at the atlantodental interval. Basilar impression. Upper chest: Evaluation is somewhat limited by motion. No focal pulmonary opacity or pleural effusion. Centrilobular and paraseptal emphysema. Other: None. IMPRESSION: 1. No acute process in the neck. 2. Severe degenerative changes in the cervical spine, as described above. 3. Aortic Atherosclerosis (ICD10-I70.0) and Emphysema (ICD10-J43.9). Electronically Signed   By: Merilyn Baba M.D.   On: 04/15/2021 20:40    Procedures Procedures    Medications Ordered in ED Medications  lactated ringers infusion ( Intravenous Not Given 04/15/21  2357)   nirmatrelvir/ritonavir EUA (renal dosing) (PAXLOVID) 2 tablet (2 tablets Oral Given 04/15/21 2355)  lactated ringers bolus 1,000 mL (0 mLs Intravenous Stopped 04/15/21 2153)  iohexol (OMNIPAQUE) 300 MG/ML solution 75 mL (75 mLs Intravenous Contrast Given 04/15/21 2002)  ketorolac (TORADOL) 15 MG/ML injection 15 mg (15 mg Intravenous Given 04/15/21 2152)  morphine (PF) 4 MG/ML injection 4 mg (4 mg Intravenous Given 04/15/21 2153)  acetaminophen (TYLENOL) tablet 650 mg (650 mg Oral Given 04/15/21 2253)    ED Course/ Medical Decision Making/ A&P Clinical Course as of 04/16/21 0044  Mon Apr 15, 2021  2051 CT Soft Tissue Neck W Contrast [RD]    Clinical Course User Index [RD] Godfrey Pick, MD                           Medical Decision Making Amount and/or Complexity of Data Reviewed Radiology: ordered. Decision-making details documented in ED Course.  Risk OTC drugs. Prescription drug management.   This patient presents to the ED for concern of acute on chronic neck pain, this involves an extensive number of treatment options, and is a complaint that carries with it a high risk of complications and morbidity.  The differential diagnosis includes metastatic spread of prostate cancer, lymphadenitis, arthritis, myositis   Co morbidities that complicate the patient evaluation  HTN, HLD, metastatic prostate cancer (currently on Lupron), CAD, dementia, GERD, OSA, and chronic Foley   Additional history obtained:  Additional history obtained from patient's stepdaughter External records from outside source obtained and reviewed including EMR   Lab Tests:  I Ordered, and personally interpreted labs.  The pertinent results include: Baseline pyuria, leukocytosis, COVID-19 positive   Imaging Studies ordered:  I ordered imaging studies including CT neck, chest x-ray I independently visualized and interpreted imaging: CT neck showed no acute findings to explain the patient's pain.  Chest x-ray  showed no acute findings I agree with the radiologist interpretation   Cardiac Monitoring:  The patient was maintained on a cardiac monitor.  I personally viewed and interpreted the cardiac monitored which showed an underlying rhythm of: Sinus rhythm   Medicines ordered and prescription drug management:  I ordered medication including IV fluids for decreased p.o. intake; Toradol, morphine, and Tylenol for analgesia Reevaluation of the patient after these medicines showed that the patient improved I have reviewed the patients home medicines and have made adjustments as needed  Consultations Obtained:  I requested consultation with the hospitalist,  and discussed lab and imaging findings as well as pertinent plan - they recommend: Discharge with adequate pain control   Problem List / ED Course:  Patient is 87 year old male with current metastatic prostate cancer and recent diagnosis of COVID-19, presenting for acute on chronic right lateral neck pain, fatigue, and decreased p.o. intake.  History is provided by both the patient and his stepdaughter, who sees him on most days.  On exam, patient is overall well-appearing.  His breathing is unlabored.  His vital signs are reassuring with normal SPO2 on room air.  Lungs are clear to auscultation.  His chief complaint is a area on the right lower lateral aspect of his neck which causes him discomfort.  Pain is worsened with movement and palpation.  Patient states that he takes Tylenol only at home for relief.  Given his decreased p.o. intake, bolus of IV fluids was ordered.  Patient was given Toradol and morphine for analgesia.  He underwent a CT scan of  his neck which did not show any findings to explain this area of pain.  Following pain medication, patient reported significant improvement.  I had shared decision-making discussions with him and his daughter.  Patient was initially looking forward to going home.  He would like to undergo Paxlovid  therapy.  Patient's daughter is hesitant given limited family support at home.  On reassessment, patient stated that he was more in favor of staying in the hospital.  I did speak with the hospitalist, Dr. Bridgett Larsson, who came and spoke with the patient and his daughter as well.  Following further discussions, patient and daughter do feel comfortable with going home.  Patient likely needs increased pain control at home given his areas of chronic pain in the setting of metastatic prostate cancer.  Dr. Bridgett Larsson provided prescriptions for morphine as well as Paxlovid.  Patient was advised to return to the ED if he does experience worsening severity of symptoms.   Reevaluation:  After the interventions noted above, I reevaluated the patient and found that they have :improved   Social Determinants of Health:  Metastatic cancer, chronic pain   Dispostion:  After consideration of the diagnostic results and the patients response to treatment, I feel that the patent would benefit from discharge.          Final Clinical Impression(s) / ED Diagnoses Final diagnoses:  [id number]    Rx / DC Orders ED Discharge Orders          Ordered    nirmatrelvir/ritonavir EUA, renal dosing, (PAXLOVID) 10 x 150 MG & 10 x 100MG  TABS  2 times daily,   Status:  Discontinued        04/15/21 2343    nirmatrelvir/ritonavir EUA, renal dosing, (PAXLOVID) 10 x 150 MG & 10 x 100MG  TABS  2 times daily        04/15/21 2356    morphine (MSIR) 15 MG tablet  Every 4 hours PRN        04/15/21 2356    Increase activity slowly        04/15/21 2356    Diet - low sodium heart healthy        04/15/21 2356    Call MD for:  temperature >100.4        04/15/21 2356    Call MD for:  persistant nausea and vomiting        04/15/21 2356    Call MD for:  severe uncontrolled pain        04/15/21 2356              Godfrey Pick, MD 04/16/21 0045

## 2021-04-16 ENCOUNTER — Telehealth (HOSPITAL_COMMUNITY): Payer: Self-pay | Admitting: Emergency Medicine

## 2021-04-16 ENCOUNTER — Telehealth: Payer: Self-pay | Admitting: Oncology

## 2021-04-16 MED ORDER — MORPHINE SULFATE 15 MG PO TABS: 7.5000 mg | ORAL_TABLET | ORAL | 0 refills | Status: AC | PRN

## 2021-04-16 MED ORDER — NIRMATRELVIR/RITONAVIR (PAXLOVID) TABLET (RENAL DOSING): 2.0000 | ORAL_TABLET | Freq: Two times a day (BID) | ORAL | 0 refills | Status: AC

## 2021-04-16 NOTE — Telephone Encounter (Signed)
Note created to e prescribe meds from last night ER visit

## 2021-04-16 NOTE — Telephone Encounter (Signed)
Scheduled appt per 2/17 referral. [name] daughter is aware of appt date and time. [name] daughter is aware to arrive 15 mins prior to appt time and to bring and updated insurance card. [name] daughter is aware of appt location.

## 2021-04-24 ENCOUNTER — Encounter: Payer: Self-pay | Admitting: General Practice

## 2021-04-24 ENCOUNTER — Telehealth: Payer: Self-pay | Admitting: Neurology

## 2021-04-24 NOTE — Telephone Encounter (Signed)
I returned the call to [facility]. She will proceed with the needed orders below. ?

## 2021-04-24 NOTE — Progress Notes (Signed)
Andre Casey ? ?Reviewed Advance Directives with Andre Casey, son Andre Casey, and daughter Andre Casey. ? ?Andre Casey values quality of life and wishes to be kept as comfortable as possible at the end of his life, rather than to have his life prolonged by life-prolonging measures. ? ?He has chosen the following people as his heath care agents to have [place]: ? ?Andre Casey - cell: [phone] ?Andre Casey - cell: [phone] ?Andre Casey - cell: [phone] ? ?Provided copies and original for Andre Casey to give to health care agents and primary care provider. One copy to Health Information Management for scanning into his electronic medical record with [facility]. ? ? ?Chaplain Lorrin Jackson, MDiv, [facility] ?Pager [phone] ?Voicemail [phone]  ?

## 2021-04-24 NOTE — Telephone Encounter (Signed)
Andre Casey (Patient Care Manager) @ [facility] is asking for verbal orders for plan of care approval  ?Nursing Frequency 1 week 1 ?2 week 2 ?1 week 2 ? 1 every other week 4 ? With 2 PRN's and Social work evaluations.   ?

## 2021-04-25 ENCOUNTER — Other Ambulatory Visit: Payer: Self-pay

## 2021-04-25 ENCOUNTER — Emergency Department (HOSPITAL_BASED_OUTPATIENT_CLINIC_OR_DEPARTMENT_OTHER): Payer: Medicare HMO

## 2021-04-25 ENCOUNTER — Inpatient Hospital Stay (HOSPITAL_BASED_OUTPATIENT_CLINIC_OR_DEPARTMENT_OTHER)
Admission: EM | Admit: 2021-04-25 | Discharge: 2021-04-28 | DRG: 698 | Disposition: A | Discharge status: Home Health | Payer: Medicare HMO | Attending: Student | Admitting: Student

## 2021-04-25 ENCOUNTER — Encounter (HOSPITAL_BASED_OUTPATIENT_CLINIC_OR_DEPARTMENT_OTHER): Payer: Self-pay

## 2021-04-25 DIAGNOSIS — Z825 Family history of asthma and other chronic lower respiratory diseases: Secondary | ICD-10-CM

## 2021-04-25 DIAGNOSIS — T502X5A Adverse effect of carbonic-anhydrase inhibitors, benzothiadiazides and other diuretics, initial encounter: Secondary | ICD-10-CM | POA: Diagnosis present

## 2021-04-25 DIAGNOSIS — R54 Age-related physical debility: Secondary | ICD-10-CM | POA: Diagnosis present

## 2021-04-25 DIAGNOSIS — Y846 Urinary catheterization as the cause of abnormal reaction of the patient, or of later complication, without mention of misadventure at the time of the procedure: Secondary | ICD-10-CM | POA: Diagnosis present

## 2021-04-25 DIAGNOSIS — R4189 Other symptoms and signs involving cognitive functions and awareness: Secondary | ICD-10-CM | POA: Diagnosis present

## 2021-04-25 DIAGNOSIS — Z9079 Acquired absence of other genital organ(s): Secondary | ICD-10-CM

## 2021-04-25 DIAGNOSIS — K869 Disease of pancreas, unspecified: Secondary | ICD-10-CM | POA: Diagnosis present

## 2021-04-25 DIAGNOSIS — Z66 Do not resuscitate: Secondary | ICD-10-CM | POA: Diagnosis present

## 2021-04-25 DIAGNOSIS — G301 Alzheimer's disease with late onset: Secondary | ICD-10-CM | POA: Diagnosis present

## 2021-04-25 DIAGNOSIS — R6520 Severe sepsis without septic shock: Secondary | ICD-10-CM | POA: Diagnosis present

## 2021-04-25 DIAGNOSIS — Z823 Family history of stroke: Secondary | ICD-10-CM

## 2021-04-25 DIAGNOSIS — D649 Anemia, unspecified: Secondary | ICD-10-CM | POA: Diagnosis present

## 2021-04-25 DIAGNOSIS — Z955 Presence of coronary angioplasty implant and graft: Secondary | ICD-10-CM | POA: Diagnosis not present

## 2021-04-25 DIAGNOSIS — Z96611 Presence of right artificial shoulder joint: Secondary | ICD-10-CM | POA: Diagnosis present

## 2021-04-25 DIAGNOSIS — K219 Gastro-esophageal reflux disease without esophagitis: Secondary | ICD-10-CM | POA: Diagnosis present

## 2021-04-25 DIAGNOSIS — E871 Hypo-osmolality and hyponatremia: Secondary | ICD-10-CM | POA: Diagnosis present

## 2021-04-25 DIAGNOSIS — K8689 Other specified diseases of pancreas: Secondary | ICD-10-CM | POA: Diagnosis not present

## 2021-04-25 DIAGNOSIS — E7800 Pure hypercholesterolemia, unspecified: Secondary | ICD-10-CM | POA: Diagnosis present

## 2021-04-25 DIAGNOSIS — I129 Hypertensive chronic kidney disease with stage 1 through stage 4 chronic kidney disease, or unspecified chronic kidney disease: Secondary | ICD-10-CM | POA: Diagnosis present

## 2021-04-25 DIAGNOSIS — N179 Acute kidney failure, unspecified: Secondary | ICD-10-CM | POA: Diagnosis present

## 2021-04-25 DIAGNOSIS — N2889 Other specified disorders of kidney and ureter: Secondary | ICD-10-CM | POA: Diagnosis not present

## 2021-04-25 DIAGNOSIS — Z8616 Personal history of COVID-19: Secondary | ICD-10-CM

## 2021-04-25 DIAGNOSIS — Z79899 Other long term (current) drug therapy: Secondary | ICD-10-CM | POA: Diagnosis not present

## 2021-04-25 DIAGNOSIS — Z85828 Personal history of other malignant neoplasm of skin: Secondary | ICD-10-CM

## 2021-04-25 DIAGNOSIS — F0280 Dementia in other diseases classified elsewhere without behavioral disturbance: Secondary | ICD-10-CM | POA: Diagnosis present

## 2021-04-25 DIAGNOSIS — H9190 Unspecified hearing loss, unspecified ear: Secondary | ICD-10-CM | POA: Diagnosis present

## 2021-04-25 DIAGNOSIS — I447 Left bundle-branch block, unspecified: Secondary | ICD-10-CM | POA: Diagnosis present

## 2021-04-25 DIAGNOSIS — A419 Sepsis, unspecified organism: Secondary | ICD-10-CM | POA: Diagnosis not present

## 2021-04-25 DIAGNOSIS — E860 Dehydration: Secondary | ICD-10-CM | POA: Diagnosis present

## 2021-04-25 DIAGNOSIS — B9620 Unspecified Escherichia coli [E. coli] as the cause of diseases classified elsewhere: Secondary | ICD-10-CM | POA: Diagnosis present

## 2021-04-25 DIAGNOSIS — I252 Old myocardial infarction: Secondary | ICD-10-CM

## 2021-04-25 DIAGNOSIS — Z923 Personal history of irradiation: Secondary | ICD-10-CM

## 2021-04-25 DIAGNOSIS — Z8249 Family history of ischemic heart disease and other diseases of the circulatory system: Secondary | ICD-10-CM

## 2021-04-25 DIAGNOSIS — U071 COVID-19: Secondary | ICD-10-CM | POA: Diagnosis present

## 2021-04-25 DIAGNOSIS — Z79890 Hormone replacement therapy (postmenopausal): Secondary | ICD-10-CM

## 2021-04-25 DIAGNOSIS — N189 Chronic kidney disease, unspecified: Secondary | ICD-10-CM

## 2021-04-25 DIAGNOSIS — Z7982 Long term (current) use of aspirin: Secondary | ICD-10-CM | POA: Diagnosis not present

## 2021-04-25 DIAGNOSIS — Z87891 Personal history of nicotine dependence: Secondary | ICD-10-CM

## 2021-04-25 DIAGNOSIS — C61 Malignant neoplasm of prostate: Secondary | ICD-10-CM | POA: Diagnosis present

## 2021-04-25 DIAGNOSIS — Y92239 Unspecified place in hospital as the place of occurrence of the external cause: Secondary | ICD-10-CM | POA: Diagnosis present

## 2021-04-25 DIAGNOSIS — D72825 Bandemia: Secondary | ICD-10-CM | POA: Diagnosis present

## 2021-04-25 DIAGNOSIS — N390 Urinary tract infection, site not specified: Secondary | ICD-10-CM | POA: Diagnosis present

## 2021-04-25 DIAGNOSIS — T83511A Infection and inflammatory reaction due to indwelling urethral catheter, initial encounter: Secondary | ICD-10-CM | POA: Diagnosis present

## 2021-04-25 DIAGNOSIS — I2510 Atherosclerotic heart disease of native coronary artery without angina pectoris: Secondary | ICD-10-CM | POA: Diagnosis present

## 2021-04-25 DIAGNOSIS — Z888 Allergy status to other drugs, medicaments and biological substances status: Secondary | ICD-10-CM

## 2021-04-25 DIAGNOSIS — N1831 Chronic kidney disease, stage 3a: Secondary | ICD-10-CM | POA: Diagnosis present

## 2021-04-25 DIAGNOSIS — T83510A Infection and inflammatory reaction due to cystostomy catheter, initial encounter: Secondary | ICD-10-CM | POA: Diagnosis present

## 2021-04-25 DIAGNOSIS — R591 Generalized enlarged lymph nodes: Secondary | ICD-10-CM | POA: Diagnosis present

## 2021-04-25 DIAGNOSIS — Q549 Hypospadias, unspecified: Secondary | ICD-10-CM

## 2021-04-25 DIAGNOSIS — I10 Essential (primary) hypertension: Secondary | ICD-10-CM

## 2021-04-25 DIAGNOSIS — Z8546 Personal history of malignant neoplasm of prostate: Secondary | ICD-10-CM

## 2021-04-25 DIAGNOSIS — N472 Paraphimosis: Secondary | ICD-10-CM | POA: Diagnosis present

## 2021-04-25 DIAGNOSIS — F02B0 Dementia in other diseases classified elsewhere, moderate, without behavioral disturbance, psychotic disturbance, mood disturbance, and anxiety: Secondary | ICD-10-CM | POA: Diagnosis present

## 2021-04-25 DIAGNOSIS — B961 Klebsiella pneumoniae [K. pneumoniae] as the cause of diseases classified elsewhere: Secondary | ICD-10-CM | POA: Diagnosis present

## 2021-04-25 DIAGNOSIS — E861 Hypovolemia: Secondary | ICD-10-CM | POA: Diagnosis present

## 2021-04-25 DIAGNOSIS — R5381 Other malaise: Secondary | ICD-10-CM

## 2021-04-25 LAB — URINALYSIS, ROUTINE W REFLEX MICROSCOPIC
Bilirubin Urine: NEGATIVE
Glucose, UA: NEGATIVE mg/dL
Ketones, ur: NEGATIVE mg/dL
Nitrite: POSITIVE — AB
Protein, ur: 30 mg/dL — AB
Specific Gravity, Urine: 1.029 (ref 1.005–1.030)
WBC, UA: >50 WBC/hpf — ABNORMAL HIGH (ref 0–5)
pH: 6.5 (ref 5.0–8.0)

## 2021-04-25 LAB — COMPREHENSIVE METABOLIC PANEL
ALT: 16 U/L (ref 0–44)
AST: 21 U/L (ref 15–41)
Albumin: 3.2 g/dL — ABNORMAL LOW (ref 3.5–5.0)
Alkaline Phosphatase: 91 U/L (ref 38–126)
Anion gap: 11 (ref 5–15)
BUN: 26 mg/dL — ABNORMAL HIGH (ref 8–23)
CO2: 24 mmol/L (ref 22–32)
Calcium: 9 mg/dL (ref 8.9–10.3)
Chloride: 94 mmol/L — ABNORMAL LOW (ref 98–111)
Creatinine, Ser: 1.52 mg/dL — ABNORMAL HIGH (ref 0.61–1.24)
GFR, Estimated: 44 mL/min — ABNORMAL LOW (ref >60)
Glucose, Bld: 136 mg/dL — ABNORMAL HIGH (ref 70–99)
Potassium: 3.9 mmol/L (ref 3.5–5.1)
Sodium: 129 mmol/L — ABNORMAL LOW (ref 135–145)
Total Bilirubin: 0.5 mg/dL (ref 0.3–1.2)
Total Protein: 6.5 g/dL (ref 6.5–8.1)

## 2021-04-25 LAB — CBC WITH DIFFERENTIAL/PLATELET
Abs Immature Granulocytes: 0.15 10*3/uL — ABNORMAL HIGH (ref 0.00–0.07)
Basophils Absolute: 0 10*3/uL (ref 0.0–0.1)
Basophils Relative: 0 %
Eosinophils Absolute: 0.1 10*3/uL (ref 0.0–0.5)
Eosinophils Relative: 0 %
HCT: 29.4 % — ABNORMAL LOW (ref 39.0–52.0)
Hemoglobin: 9.2 g/dL — ABNORMAL LOW (ref 13.0–17.0)
Immature Granulocytes: 1 %
Lymphocytes Relative: 5 %
Lymphs Abs: 0.9 10*3/uL (ref 0.7–4.0)
MCH: 27.9 pg (ref 26.0–34.0)
MCHC: 31.3 g/dL (ref 30.0–36.0)
MCV: 89.1 fL (ref 80.0–100.0)
Monocytes Absolute: 1.9 10*3/uL — ABNORMAL HIGH (ref 0.1–1.0)
Monocytes Relative: 10 %
Neutro Abs: 15.4 10*3/uL — ABNORMAL HIGH (ref 1.7–7.7)
Neutrophils Relative %: 84 %
Platelets: 493 10*3/uL — ABNORMAL HIGH (ref 150–400)
RBC: 3.3 MIL/uL — ABNORMAL LOW (ref 4.22–5.81)
RDW: 13.8 % (ref 11.5–15.5)
WBC: 18.3 10*3/uL — ABNORMAL HIGH (ref 4.0–10.5)
nRBC: 0 % (ref 0.0–0.2)

## 2021-04-25 LAB — RESP PANEL BY RT-PCR (FLU A&B, COVID) ARPGX2
Influenza A by PCR: NEGATIVE
Influenza B by PCR: NEGATIVE
SARS Coronavirus 2 by RT PCR: POSITIVE — AB

## 2021-04-25 LAB — LACTIC ACID, PLASMA
Lactic Acid, Venous: 1.8 mmol/L (ref 0.5–1.9)
Lactic Acid, Venous: 1.4 mmol/L (ref 0.5–1.9)

## 2021-04-25 LAB — APTT: aPTT: 38 seconds — ABNORMAL HIGH (ref 24–36)

## 2021-04-25 LAB — PROTIME-INR
INR: 1.2 (ref 0.8–1.2)
Prothrombin Time: 15.3 seconds — ABNORMAL HIGH (ref 11.4–15.2)

## 2021-04-25 MED ORDER — HEPARIN SODIUM (PORCINE) 5000 UNIT/ML IJ SOLN
5000.0000 [IU] | Freq: Three times a day (TID) | INTRAMUSCULAR | Status: DC
  Administered 2021-04-26 – 2021-04-28 (×7): 5000 [IU] via SUBCUTANEOUS
  Filled 2021-04-25 (×7): qty 1

## 2021-04-25 MED ORDER — ONDANSETRON HCL 4 MG/2ML IJ SOLN
4.0000 mg | Freq: Four times a day (QID) | INTRAMUSCULAR | Status: DC | PRN
Start: 2021-04-25 — End: 2021-04-28

## 2021-04-25 MED ORDER — ASPIRIN EC 81 MG PO TBEC
81.0000 mg | DELAYED_RELEASE_TABLET | Freq: Every day | ORAL | Status: DC
  Administered 2021-04-26 – 2021-04-28 (×3): 81 mg via ORAL
  Filled 2021-04-25 (×3): qty 1

## 2021-04-25 MED ORDER — SENNOSIDES-DOCUSATE SODIUM 8.6-50 MG PO TABS: 1.0000 | ORAL_TABLET | Freq: Every evening | ORAL | Status: DC | PRN

## 2021-04-25 MED ORDER — MELATONIN 3 MG PO TABS
3.0000 mg | ORAL_TABLET | Freq: Every day | ORAL | Status: DC
  Administered 2021-04-26 – 2021-04-27 (×3): 3 mg via ORAL
  Filled 2021-04-25 (×3): qty 1

## 2021-04-25 MED ORDER — ACETAMINOPHEN 325 MG PO TABS
650.0000 mg | ORAL_TABLET | Freq: Four times a day (QID) | ORAL | Status: DC | PRN
  Administered 2021-04-26 – 2021-04-27 (×4): 650 mg via ORAL
  Filled 2021-04-25 (×4): qty 2

## 2021-04-25 MED ORDER — IOHEXOL 300 MG/ML  SOLN
100.0000 mL | Freq: Once | INTRAMUSCULAR | Status: AC | PRN
  Administered 2021-04-25: 75 mL via INTRAVENOUS

## 2021-04-25 MED ORDER — LACTATED RINGERS IV BOLUS
1000.0000 mL | Freq: Once | INTRAVENOUS | Status: AC
  Administered 2021-04-25: 1000 mL via INTRAVENOUS

## 2021-04-25 MED ORDER — SODIUM CHLORIDE 0.9 % IV SOLN
2.0000 g | INTRAVENOUS | Status: DC
  Administered 2021-04-25 – 2021-04-27 (×3): 2 g via INTRAVENOUS
  Filled 2021-04-25 (×3): qty 20

## 2021-04-25 MED ORDER — ONDANSETRON HCL 4 MG PO TABS
4.0000 mg | ORAL_TABLET | Freq: Four times a day (QID) | ORAL | Status: DC | PRN
Start: 2021-04-25 — End: 2021-04-28

## 2021-04-25 MED ORDER — MEMANTINE HCL 10 MG PO TABS
10.0000 mg | ORAL_TABLET | Freq: Two times a day (BID) | ORAL | Status: DC
  Administered 2021-04-26 – 2021-04-28 (×5): 10 mg via ORAL
  Filled 2021-04-25 (×5): qty 1

## 2021-04-25 MED ORDER — ACETAMINOPHEN 650 MG RE SUPP
650.0000 mg | Freq: Four times a day (QID) | RECTAL | Status: DC | PRN
Start: 2021-04-25 — End: 2021-04-28

## 2021-04-25 MED ORDER — FENTANYL CITRATE PF 50 MCG/ML IJ SOSY
25.0000 ug | PREFILLED_SYRINGE | Freq: Once | INTRAMUSCULAR | Status: AC
  Administered 2021-04-25: 25 ug via INTRAVENOUS
  Filled 2021-04-25: qty 1

## 2021-04-25 MED ORDER — LACTATED RINGERS IV SOLN: INTRAVENOUS | Status: DC

## 2021-04-25 NOTE — Assessment & Plan Note (Addendum)
Lateral right kidney enhancing exophytic mass again seen on CT imaging concerning for renal cell carcinoma. ?-Outpatient follow-up with urology and oncology ?

## 2021-04-25 NOTE — Progress Notes (Signed)
  [facility] will assume care on arrival to accepting facility. Until arrival, care as per EDP. However, [facility] available 24/7 for questions and assistance.   Nursing staff please page [facility] Admits and Consults ([phone]) as soon as the patient arrives to the hospital.  Yadhira Mckneely, DO [facility]  

## 2021-04-25 NOTE — Assessment & Plan Note (Addendum)
Tested positive on 04/15/2021.  No hypoxemia, respiratory distress or radiologic evidence of pneumonia.  He was discharged on Paxlovid. ?-Discontinue isolation precaution. ?

## 2021-04-25 NOTE — Progress Notes (Signed)
Pt arrived to unit via stretcher. Alert and oriented x 4. Oriented to room and callbell with no complications. Initial assessment completed. Dentures at bedside. Minimal pain at RLQ. Chronic foley in place. Leg drainage bag replaced. Will continue to monitor ?
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
## 2021-04-25 NOTE — Sepsis Progress Note (Signed)
ELink tracking the Code Sepsis. 

## 2021-04-25 NOTE — Assessment & Plan Note (Addendum)
Stable, denies any chest pain.   ?-Continue home Coreg and aspirin. ?

## 2021-04-25 NOTE — Sepsis Progress Note (Signed)
Notified bedside nurse of need to administer antibiotics.  

## 2021-04-25 NOTE — Assessment & Plan Note (Addendum)
Recent Labs  ?  05/02/20 ?0334 05/03/20 ?0353 03/11/21 ?1908 03/12/21 ?0306 03/13/21 ?0251 04/15/21 ?1433 04/25/21 ?1547 04/26/21 ?5732 04/27/21 ?2025 04/28/21 ?0507  ?BUN 24* 22 24* 22 19 30* 26* 20 15 12   ?CREATININE 1.17 1.10 1.28* 1.15 1.22 1.21 1.52* 1.21 0.92 0.89  ?AKI likely due to hypovolemia, dehydration and severe sepsis.  Also on lisinopril/HCTZ ?-Lisinopril, HCTZ and amlodipine discontinued ?-Recheck renal function at follow-up ?

## 2021-04-25 NOTE — Assessment & Plan Note (Addendum)
S/p  radical prostatectomy in 1992.  Recent imaging showed bulky retroperitoneal adenopathy consistent with metastatic prostate cancer.  Repeat imaging similar to prior.  Follows with urology, Dr. Tresa Endo, with [facility].  Scheduled to see oncology, Dr. Alen Blew, on 05/01/2021.  Patient with chronic Foley. ?-Outpatient follow-up with urology and oncology. ?-Foley exchanged by urology ?

## 2021-04-25 NOTE — Assessment & Plan Note (Addendum)
Likely due to hypovolemia, lisinopril and HCTZ.  Cannot exclude SIADH.  Stable. ?-Lisinopril and HCTZ discontinued on discharge ?-Recheck sodium level at follow-up ?

## 2021-04-25 NOTE — Hospital Course (Addendum)
88 year old M with PMH of metastatic prostate cancer s/p radical prostatectomy in 1992, chronic indwelling Foley, penile prosthesis, CAD/DES to LAD in 2017, LBBB, CKD-3A, HTN, HLD and cognitive impairment brought to ED due to confusion, generalized weakness, poor p.o. intake and fever to 102 ?F, and admitted for catheter associated urinary tract infection.  UA concerning for UTI.  He had leukocytosis to 18.  CT abdomen and pelvis with contrast showed scrotal and penile edema concerning for cellulitis, and known stable pancreatic head and right renal mass as well as bulky retroperitoneal and inguinal LAD.  Patient was started on IV ceftriaxone and IV fluid and admitted.  ? ?Upon further review, patient meets criteria for severe sepsis with leukocytosis, fever, AKI and catheter associated UTI.  Lactic acid negative. The next day, urology consulted.  Foley catheter exchanged. ? ?Patient's blood cultures remain negative.  Urine culture with Klebsiella pneumonia and Staph aureus.  He received ceftriaxone for 3 days and discharged on p.o. cefadroxil 500 mg twice daily for 7 more days.  He is cleared for discharge by urology for outpatient follow-up with his urologist. ?

## 2021-04-25 NOTE — ED Provider Notes (Signed)
[facility] EMERGENCY DEPT Provider Note   CSN: [id number] Arrival date & time: 04/25/21  1448     History  Chief Complaint  Patient presents with   Dysuria    [name]. is a 88 y.o. male.  Pt is a 88 y/o male with hx of HTN, HLD, metastatic prostate cancer (currently on Lupron), CAD, dementia, GERD, chronic Foley that was last changed in [place] who is presenting today with his family member due to his 7 days of fever, poor oral intake, more confusion, generalized weakness and just not acting himself.  Temperature of 102 documented yesterday at home.  Patient has not had any particular complaints but they noticed the urine in his catheter was tea colored and were concerned.  Also they had noted he was having some swelling of his penis.  Patient is awake and alert but is only able to answer yes and no questions.  Of note patient was recently seen in the emergency room on 20 February after testing positive for COVID but at that time was able to go home with Paxlovid therapy.  Family feels that he is recovered from this.  The history is provided by the patient, a caregiver and medical records.  Dysuria Presenting symptoms: dysuria       Home Medications Prior to Admission medications   Medication Sig Start Date End Date Taking? Authorizing Provider  acetaminophen (TYLENOL) 500 MG tablet Take 1,000 mg by mouth every 8 (eight) hours as needed for mild pain or headache.   Yes [provider]  Ascorbic Acid (VITAMIN C PO) Take 1 tablet by mouth every morning.   Yes [provider]  aspirin EC 81 MG tablet Take 1 tablet (81 mg total) by mouth daily. Swallow whole. 03/17/21  Yes Antonieta Pert, MD  lisinopril-hydrochlorothiazide (ZESTORETIC) 20-12.5 MG tablet Take 1 tablet by mouth every morning.   Yes [provider]  MAGNESIUM PO Take 1 tablet by mouth See admin instructions. Take one tablet by mouth every Monday, Wednesday, Friday night   Yes  [provider]  Melatonin 3 MG CAPS Take 1 capsule by mouth at bedtime.   Yes [provider]  memantine (NAMENDA) 10 MG tablet Take 1 tablet (10 mg total) by mouth 2 (two) times daily. 04/08/21  Yes Marcial Pacas, MD  Multiple Vitamins-Minerals (MULTIVITAMIN WITH MINERALS) tablet Take 1 tablet by mouth every morning.   Yes [provider]  Multiple Vitamins-Minerals (PRESERVISION AREDS 2) CAPS Take 1 capsule by mouth 2 (two) times daily.   Yes [provider]  POTASSIUM PO Take 1 tablet by mouth at bedtime.   Yes [provider]  Probiotic Product (PROBIOTIC PO) Take 1 tablet by mouth every morning.   Yes [provider]  traMADol (ULTRAM) 50 MG tablet Take 1 tablet (50 mg total) by mouth every 6 (six) hours as needed. 04/29/20  Yes Delo, Nathaneil Canary, MD  amLODipine (NORVASC) 5 MG tablet TAKE 1 TABLET BY MOUTH ONCE DAILY Patient not taking: Reported on 04/25/2021 01/18/18   Barrett, Evelene Croon, PA-C  carvedilol (COREG) 6.25 MG tablet Take 1 tablet (6.25 mg total) by mouth 2 (two) times daily with a meal. Patient not taking: Reported on 04/25/2021 04/30/16   Lelon Perla, MD      Allergies    Donepezil and Namzaric [memantine hcl-donepezil hcl]    Review of Systems   Review of Systems  Genitourinary:  Positive for dysuria.   Physical Exam Updated Vital Signs  BP (!) 143/67    Pulse 78    Temp 98 F (36.7 C) (Oral)    Resp (!) 22    Ht 5\' 8"  (1.727 m)    Wt 62.6 kg    SpO2 96%    BMI 20.98 kg/m  Physical Exam Vitals and nursing note reviewed.  Constitutional:      General: He is not in acute distress.    Appearance: He is well-developed.  HENT:     Head: Normocephalic and atraumatic.  Eyes:     Conjunctiva/sclera: Conjunctivae normal.     Pupils: Pupils are equal, round, and reactive to light.  Cardiovascular:     Rate and Rhythm: Normal rate and regular rhythm.     Heart sounds: No murmur heard. Pulmonary:     Effort: Pulmonary effort  is normal. No respiratory distress.     Breath sounds: Normal breath sounds. No wheezing or rales.  Abdominal:     General: There is no distension.     Palpations: Abdomen is soft.     Tenderness: There is abdominal tenderness in the suprapubic area. There is no guarding or rebound.  Genitourinary:    Comments: Patient has erythema of the mons pubis with mild tenderness as well as significant erythema, induration noted to the shaft of the penis and the glans with significant tenderness with palpation.  Also diffuse edema of the penis.  Minimal tenderness of the scrotum with some mild erythema.  No erythema tracking to the buttocks or perineal area Musculoskeletal:        General: No tenderness. Normal range of motion.     Cervical back: Normal range of motion and neck supple.  Skin:    General: Skin is warm and dry.     Findings: No erythema or rash.  Neurological:     Mental Status: He is alert. Mental status is at baseline.     Comments: Oriented to person and place.  Psychiatric:        Behavior: Behavior normal.    ED Results / Procedures / Treatments   Labs (all labs ordered are listed, but only abnormal results are displayed) Labs Reviewed  RESP PANEL BY RT-PCR (FLU A&B, COVID) ARPGX2 - Abnormal; Notable for the following components:      Result Value   SARS Coronavirus 2 by RT PCR POSITIVE (*)    All other components within normal limits  URINALYSIS, ROUTINE W REFLEX MICROSCOPIC - Abnormal; Notable for the following components:   APPearance HAZY (*)    Hgb urine dipstick MODERATE (*)    Protein, ur 30 (*)    Nitrite POSITIVE (*)    Leukocytes,Ua LARGE (*)    WBC, UA >50 (*)    Bacteria, UA MANY (*)    All other components within normal limits  COMPREHENSIVE METABOLIC PANEL - Abnormal; Notable for the following components:   Sodium 129 (*)    Chloride 94 (*)    Glucose, Bld 136 (*)    BUN 26 (*)    Creatinine, Ser 1.52 (*)    Albumin 3.2 (*)    GFR, Estimated 44 (*)     All other components within normal limits  CBC WITH DIFFERENTIAL/PLATELET - Abnormal; Notable for the following components:   WBC 18.3 (*)    RBC 3.30 (*)    Hemoglobin 9.2 (*)    HCT 29.4 (*)    Platelets 493 (*)    Neutro Abs 15.4 (*)    Monocytes Absolute 1.9 (*)  Abs Immature Granulocytes 0.15 (*)    All other components within normal limits  PROTIME-INR - Abnormal; Notable for the following components:   Prothrombin Time 15.3 (*)    All other components within normal limits  APTT - Abnormal; Notable for the following components:   aPTT 38 (*)    All other components within normal limits  CULTURE, BLOOD (ROUTINE X 2)  CULTURE, BLOOD (ROUTINE X 2)  URINE CULTURE  LACTIC ACID, PLASMA  LACTIC ACID, PLASMA    EKG EKG Interpretation  Date/Time:  Thursday April 25 2021 15:52:57 EST Ventricular Rate:  68 PR Interval:  161 QRS Duration: 147 QT Interval:  475 QTC Calculation: 506 R Axis:   -34 Text Interpretation: Sinus rhythm Left bundle branch block No significant change since last tracing Confirmed by Blanchie Dessert [phone]) on 04/25/2021 4:20:17 PM  Radiology CT ABDOMEN PELVIS W CONTRAST  Result Date: 04/25/2021 CLINICAL DATA:  Sepsis. Redness swelling and pain in penis and mons pubis. Intermittent fever and UTI symptoms for 1 week. Foley catheter in place. Testicular swelling and penile discomfort. History of prostate cancer, penile prosthesis implant, and hernia repair. EXAM: CT ABDOMEN AND PELVIS WITH CONTRAST TECHNIQUE: Multidetector CT imaging of the abdomen and pelvis was performed using the standard protocol following bolus administration of intravenous contrast. RADIATION DOSE REDUCTION: This exam was performed according to the departmental dose-optimization program which includes automated exposure control, adjustment of the mA and/or kV according to patient size and/or use of iterative reconstruction technique. CONTRAST:  70mL OMNIPAQUE IOHEXOL 300 MG/ML  SOLN  COMPARISON:  Whole-body nuclear bone scan 03/13/2021; CT abdomen and pelvis 03/12/2021; MRI abdomen 02/15/2019 FINDINGS: Lower chest: Emphysematous changes are again seen. Mild bibasilar posterior dependent subpleural curvilinear subsegmental atelectasis versus scarring. Hepatobiliary: Smooth liver contours. No focal liver mass is identified. The gallbladder is unremarkable. No intrahepatic or extrahepatic biliary ductal dilatation. Pancreas: A hypodense 1.3 cm mass is again unchanged within the pancreatic head also unchanged from MRI abdomen 02/15/2019. Within the pancreatic head, there is again unchanged mild ductal dilatation up to 4 mm (axial series 2, image 19). Spleen: Normal in size without focal abnormality. Adrenals/Urinary Tract: Adrenal glands are unremarkable. The kidneys enhance uniformly and are symmetric in size without hydronephrosis. No renal stone is seen. No significant change in 2.5 x 1.7 x 2.4 cm (transverse by craniocaudal) soft tissue enhancing mass within the lateral midpole of the right kidney, again partially exophytic. This is again highly suspicious for renal cell carcinoma. Several small hypodense renal likely cortical cysts are again seen bilaterally. Urinary bladder is decompressed by Foley catheter limiting evaluation. Stomach/Bowel: High-grade sigmoid and mild descending diverticulosis without definite inflammatory change to indicate acute diverticulitis. High-grade stool throughout the colon compatible with constipation. The terminal ileum is unremarkable. The appendix is retrocecal and appears within normal limits. No dilated loops of bowel to indicate bowel obstruction. Vascular/Lymphatic: No abdominal aortic aneurysm. Mild-to-moderate atherosclerotic calcifications. Retroperitoneal 2.8 by 1.8 cm enlarged lymph node posterior to the aorta (axial series 2, image 27) is unchanged. Distal left external iliac chain 3.2 by 2.1 cm lymph node is unchanged when measured in a similar  manner. Multiple enlarged left inguinal lymph nodes are again seen measuring up to 2.2 by 1.3 cm (axial image 59), unchanged. Reproductive: Postsurgical changes are again seen prostatectomy with numerous pelvic surgical clips from bilateral pelvic lymph node dissection. Penile implant is seen with1 reservoir within the anterior lower abdomen/pelvis. There is left-greater-than-right penile soft tissue swelling with likely subcutaneous dense  fluid (axial series 2, image 75). Previously less of the penis was imaged and is unclear whether the current swelling was partially included on the prior CT. Other: No abdominal wall hernia or abnormality.No free air or free fluid is seen within the abdomen or pelvis. No intra-abdominal or pelvic rim enhancing abscess is seen. Musculoskeletal: Mild dextrocurvature centered at L1 and mild levocurvature centered at L3. Severe multilevel degenerative disc changes. Mild retrolisthesis of L1 on L2 and L2 on L3 and grade 1 anterolisthesis of L5 on S1. IMPRESSION:: IMPRESSION: 1. There is penile and scrotal soft tissue swelling and edema. No abscess or free fluid is seen within the pelvis. 2. Otherwise, no significant change from 03/12/2021 CT. 3. Stable pancreatic head 1.3 cm hypodense mass. This demonstrates stability on multiple prior CTs and MRI 02/15/2019. 4. Lateral right kidney enhancing exophytic mass again concerning for renal cell carcinoma. 5. Stable bulky retroperitoneal lymphadenopathy and left inguinal adenopathy concerning for metastatic disease. 6. Postsurgical changes of prostatectomy and pelvic lymph node dissection. Electronically Signed   By: Yvonne Kendall M.D.   On: 04/25/2021 17:30   DG Chest Port 1 View  Result Date: 04/25/2021 CLINICAL DATA:  UTI symptoms, intermittent fever EXAM: PORTABLE CHEST 1 VIEW COMPARISON:  Chest radiograph 04/15/2021 FINDINGS: The cardiomediastinal silhouette is stable. There is no focal consolidation or pulmonary edema. There is no  pleural effusion or pneumothorax There is no acute osseous abnormality. Reverse shoulder arthroplasty is again noted on the right. IMPRESSION: Stable chest with no radiographic evidence of acute cardiopulmonary process. Electronically Signed   By: Valetta Mole M.D.   On: 04/25/2021 15:45    Procedures Procedures    Medications Ordered in ED Medications  lactated ringers infusion (has no administration in time range)  cefTRIAXone (ROCEPHIN) 2 g in sodium chloride 0.9 % 100 mL IVPB (2 g Intravenous New Bag/Given 04/25/21 1831)  lactated ringers bolus 1,000 mL ( Intravenous Stopped 04/25/21 1820)  iohexol (OMNIPAQUE) 300 MG/ML solution 100 mL (75 mLs Intravenous Contrast Given 04/25/21 1701)  fentaNYL (SUBLIMAZE) injection 25 mcg (25 mcg Intravenous Given 04/25/21 1821)    ED Course/ Medical Decision Making/ A&P                           Medical Decision Making Amount and/or Complexity of Data Reviewed Labs: ordered. Radiology: ordered. ECG/medicine tests: ordered.  Risk Prescription drug management.   Patient is an elderly male presenting today with 7 days of fever and concern for sepsis.  Patient does have a indwelling Foley catheter that was last changed in January and does not appear to be moderately contaminated as well as he has been having some diarrhea stool.  However more concerning is potential abscess or cellulitis of the penis and pubic area.  Lower suspicion for Fournier's at this time as there is no crepitus noted, it does not track onto the legs or the perineum.  However the shaft and glans of the penis are very tender, indurated and swollen.  He does report that he does have a penis implant but it does not appear to be active at this time.  Sepsis order set was initiated.  Patient is afebrile here and well-appearing.  No significant urinary retention and the Foley catheter is still draining however family reports it was very difficult to get the catheter in place and because it is  draining at this time will not attempt to change it here.  Patient given IV  fluids.  Labs are pending.  I independently visualized and interpreted patient's chest x-ray which is negative. I independently interpreted EKG and labs.  EKG with left bundle branch block which is unchanged from baseline.  6:58 PM Based on further review patient is uncircumcised and it appears that he is experiencing paraphimosis.  A wrap was placed to help decrease the swelling.  I independently visualized and interpreted pt's CXR which was wnl.  Also CT showed penile and soft tissue swelling in the scrotum, as well as persistent lymphadenopathy but no new changes.  I independently interpreted patient's labs and he has a leukocytosis today of 18,000, drop in his hemoglobin to 9 from 11 and CMP today with mild hyponatremia of 129 and mild AKI with creatinine of 1.5 from his baseline of 1.2.  Lactic acid today within normal limits.  COVID is persistently positive but he was positive greater than 10 days ago.  We will treat for UTI and cellulitis with Rocephin.  Patient's last urine culture that grew out E. coli was pansensitive.  However because patient had to have his catheter placed in the OR will defer to urology for new catheter placement.  Penis was wrapped hopefully to decrease swelling and be able to reduce the paraphimosis.  Patient meets admission criteria at this time.  Will consult hospitalist for further care.  Findings discussed with the patient and his family.  6:58 PM After wrapping the patient's penis and placing sugar edema was reduced and the paraphimosis was reduced.        Final Clinical Impression(s) / ED Diagnoses Final diagnoses:  Paraphimosis  Urinary tract infection associated with indwelling urethral catheter, initial encounter ([place])  Sepsis without acute organ dysfunction, due to unspecified organism [facility])    Rx / DC Orders ED Discharge Orders     None         Blanchie Dessert,  MD 04/25/21 1858

## 2021-04-25 NOTE — ED Notes (Signed)
Called [facility] to transport patient to [facility] [phone] ?

## 2021-04-25 NOTE — Assessment & Plan Note (Addendum)
-  See severe sepsis 

## 2021-04-25 NOTE — Assessment & Plan Note (Addendum)
Recent Labs  ?  05/01/20 ?0350 05/02/20 ?0334 05/03/20 ?0353 03/11/21 ?1908 03/12/21 ?0306 03/13/21 ?0251 04/15/21 ?1433 04/25/21 ?1547 04/26/21 ?8288 04/27/21 ?3374  ?HGB 10.7* 10.4* 9.7* 13.3 11.3* 10.9* 12.7* 9.2* 9.2* 8.6*  ?Slight drop in Hgb but no signs of bleeding.  Probably dilutional drop. ?-Continue monitoring ?-Check anemia panel in the morning ? ?

## 2021-04-25 NOTE — ED Notes (Signed)
No Urine detected during Bladder Scan Assessment. No Distension in ABD Region noted. ?

## 2021-04-25 NOTE — Assessment & Plan Note (Signed)
Has been having worsening short-term memory impairment.  Following with neurology. ?-Continue Namenda ?-Delirium precautions ?

## 2021-04-25 NOTE — ED Triage Notes (Signed)
Patient here POV from Home with UTI Symptoms.  ? ?Home Health Nurse concerned for UTI due to Intermittent Fever for approximately 1 week. ? ?Foley Catheter placed 30 days PTA. Noted Swelling at Penis and Fever of 102 today. ? ?NAD Noted during Triage. Dementia at Baseline. Ambulatory.  ?

## 2021-04-25 NOTE — Assessment & Plan Note (Addendum)
Stable 1.3 cm hypodense mass at the pancreatic head again noted on CT imaging. ?-Outpatient follow-up with oncology ?

## 2021-04-26 DIAGNOSIS — N189 Chronic kidney disease, unspecified: Secondary | ICD-10-CM

## 2021-04-26 DIAGNOSIS — A419 Sepsis, unspecified organism: Secondary | ICD-10-CM

## 2021-04-26 DIAGNOSIS — I2510 Atherosclerotic heart disease of native coronary artery without angina pectoris: Secondary | ICD-10-CM

## 2021-04-26 DIAGNOSIS — N179 Acute kidney failure, unspecified: Secondary | ICD-10-CM

## 2021-04-26 DIAGNOSIS — C61 Malignant neoplasm of prostate: Secondary | ICD-10-CM

## 2021-04-26 DIAGNOSIS — E871 Hypo-osmolality and hyponatremia: Secondary | ICD-10-CM

## 2021-04-26 DIAGNOSIS — N2889 Other specified disorders of kidney and ureter: Secondary | ICD-10-CM

## 2021-04-26 DIAGNOSIS — U071 COVID-19: Secondary | ICD-10-CM

## 2021-04-26 DIAGNOSIS — K8689 Other specified diseases of pancreas: Secondary | ICD-10-CM

## 2021-04-26 DIAGNOSIS — Z9861 Coronary angioplasty status: Secondary | ICD-10-CM

## 2021-04-26 DIAGNOSIS — R6520 Severe sepsis without septic shock: Secondary | ICD-10-CM

## 2021-04-26 DIAGNOSIS — D649 Anemia, unspecified: Secondary | ICD-10-CM

## 2021-04-26 DIAGNOSIS — G301 Alzheimer's disease with late onset: Secondary | ICD-10-CM

## 2021-04-26 DIAGNOSIS — F02B0 Dementia in other diseases classified elsewhere, moderate, without behavioral disturbance, psychotic disturbance, mood disturbance, and anxiety: Secondary | ICD-10-CM

## 2021-04-26 DIAGNOSIS — I10 Essential (primary) hypertension: Secondary | ICD-10-CM

## 2021-04-26 LAB — BASIC METABOLIC PANEL
Anion gap: 10 (ref 5–15)
BUN: 20 mg/dL (ref 8–23)
CO2: 23 mmol/L (ref 22–32)
Calcium: 8.1 mg/dL — ABNORMAL LOW (ref 8.9–10.3)
Chloride: 97 mmol/L — ABNORMAL LOW (ref 98–111)
Creatinine, Ser: 1.21 mg/dL (ref 0.61–1.24)
GFR, Estimated: 57 mL/min — ABNORMAL LOW (ref >60)
Glucose, Bld: 116 mg/dL — ABNORMAL HIGH (ref 70–99)
Potassium: 3.6 mmol/L (ref 3.5–5.1)
Sodium: 130 mmol/L — ABNORMAL LOW (ref 135–145)

## 2021-04-26 LAB — CBC
HCT: 29 % — ABNORMAL LOW (ref 39.0–52.0)
Hemoglobin: 9.2 g/dL — ABNORMAL LOW (ref 13.0–17.0)
MCH: 28.8 pg (ref 26.0–34.0)
MCHC: 31.7 g/dL (ref 30.0–36.0)
MCV: 90.9 fL (ref 80.0–100.0)
Platelets: 421 10*3/uL — ABNORMAL HIGH (ref 150–400)
RBC: 3.19 MIL/uL — ABNORMAL LOW (ref 4.22–5.81)
RDW: 13.8 % (ref 11.5–15.5)
WBC: 15.5 10*3/uL — ABNORMAL HIGH (ref 4.0–10.5)
nRBC: 0 % (ref 0.0–0.2)

## 2021-04-26 MED ORDER — CHLORHEXIDINE GLUCONATE CLOTH 2 % EX PADS
6.0000 | MEDICATED_PAD | Freq: Every day | CUTANEOUS | Status: DC
  Administered 2021-04-26 – 2021-04-28 (×3): 6 via TOPICAL

## 2021-04-26 MED ORDER — POTASSIUM CHLORIDE IN NACL 20-0.9 MEQ/L-% IV SOLN
INTRAVENOUS | Status: AC
  Filled 2021-04-26 (×3): qty 1000

## 2021-04-26 MED ORDER — FENTANYL CITRATE PF 50 MCG/ML IJ SOSY
25.0000 ug | PREFILLED_SYRINGE | Freq: Once | INTRAMUSCULAR | Status: AC
  Administered 2021-04-26: 25 ug via INTRAVENOUS
  Filled 2021-04-26: qty 1

## 2021-04-26 MED ORDER — CHLORHEXIDINE GLUCONATE CLOTH 2 % EX PADS: 6.0000 | MEDICATED_PAD | Freq: Every day | CUTANEOUS | Status: DC
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
## 2021-04-26 NOTE — Plan of Care (Signed)
Pt aox4, pleasant and cooperative with staff. ?IVF/IV Abx per orders.  ? ?Problem: Education: ?Goal: Knowledge of General Education information will improve ?Description: Including pain rating scale, medication(s)/side effects and non-pharmacologic comfort measures ?Outcome: Progressing ?  ?Problem: Health Behavior/Discharge Planning: ?Goal: Ability to manage health-related needs will improve ?Outcome: Progressing ?  ?Problem: Clinical Measurements: ?Goal: Ability to maintain clinical measurements within normal limits will improve ?Outcome: Progressing ?Goal: Will remain free from infection ?Outcome: Progressing ?Goal: Diagnostic test results will improve ?Outcome: Progressing ?Goal: Respiratory complications will improve ?Outcome: Progressing ?Goal: Cardiovascular complication will be avoided ?Outcome: Progressing ?  ?Problem: Activity: ?Goal: Risk for activity intolerance will decrease ?Outcome: Progressing ?  ?Problem: Nutrition: ?Goal: Adequate nutrition will be maintained ?Outcome: Progressing ?  ?Problem: Coping: ?Goal: Level of anxiety will decrease ?Outcome: Progressing ?  ?Problem: Elimination: ?Goal: Will not experience complications related to bowel motility ?Outcome: Progressing ?Goal: Will not experience complications related to urinary retention ?Outcome: Progressing ?  ?Problem: Pain Managment: ?Goal: General experience of comfort will improve ?Outcome: Progressing ?  ?Problem: Safety: ?Goal: Ability to remain free from injury will improve ?Outcome: Progressing ?  ?Problem: Skin Integrity: ?Goal: Risk for impaired skin integrity will decrease ?Outcome: Progressing ?  ?

## 2021-04-26 NOTE — Progress Notes (Signed)
PROGRESS NOTE  Andre Casey. HBZ:[id number] DOB: February 01, 1930   PCP: [name], MD  Patient is from: Lives with wife.  Independently ambulates at baseline.  DOA: 04/25/2021 LOS: 1  Chief complaints:  Chief Complaint  Patient presents with   Dysuria     Brief Narrative / Interim history: Andre Casey. is a 88 y.o. male with medical history significant for metastatic prostate cancer (s/p radical prostatectomy in 1992), chronic indwelling Foley catheter, CAD s/p PCI with DES to LAD 12/2015, HTN, HLD, CKD stage IIIa, LBBB, s/p implanted penile prosthesis, moderate cognitive impairment who is admitted with with UTI and AKI on CKD stage IIIa.   Subjective: Seen and examined earlier this morning.  He was sleepy but wakes to voice.  No complaints.  He denies pain, shortness of breath, nausea, vomiting or abdominal pain.  He is oriented x4 except date of month but no great insight into why he is in the hospital.  Objective: Vitals:   04/25/21 2343 04/26/21 0457 04/26/21 0844 04/26/21 1240  BP: (!) 125/58 (!) 109/51 (!) 127/54 123/65  Pulse: 77 60 65 71  Resp: 20 16 18 20   Temp: (!) 101.2 F (38.4 C) 98.3 F (36.8 C) 98 F (36.7 C) 98.5 F (36.9 C)  TempSrc: Oral Oral Oral Oral  SpO2: 98% 99% 93% 97%  Weight:      Height:        Examination:  GENERAL: No apparent distress.  Nontoxic. HEENT: MMM.  Vision and hearing grossly intact.  NECK: Supple.  No apparent JVD.  RESP: 97% on RA.  No IWOB.  Fair aeration bilaterally. CVS:  RRR. Heart sounds normal.  ABD/GI/GU: BS+. Abd soft, NTND.  Chronic Foley.  Edematous foreskin.  No erythema MSK/EXT:  Moves extremities. No apparent deformity. No edema.  SKIN: no apparent skin lesion or wound NEURO: Sleepy but wakes to voice.  Oriented x4 except date of months.  No apparent focal neuro deficit. PSYCH: Calm. Normal affect.   Procedures:  None  Microbiology summarized: COVID-19 PCR positive-but he initially  tested positive on 04/15/2021. Blood cultures NGTD Urine culture pending.  Assessment and Plan: * Severe sepsis ([place]) Meets criteria with fever, leukocytosis, AKI and catheter associated UTI and patient with chronic indwelling Foley.  No lactic acidosis.  No history of ESBL or MDR other than positive MRSA screen as far as I can tell.  Blood cultures NGTD. -Continue IV ceftriaxone -Follow urine culture -Consulted urology for Foley catheter exchange given his complex urologic history.  UTI (urinary tract infection) due to urinary indwelling Foley catheter ([place]) See severe sepsis  Hyponatremia Likely due to hypovolemia, lisinopril and HCTZ.  Cannot exclude SIADH -Change LR to NS with KCl -Continue holding lisinopril and HCTZ. -Recheck in the morning.  Acute kidney injury superimposed on chronic kidney disease ([place]) Recent Labs    04/30/20 1457 05/01/20 0350 05/02/20 0334 05/03/20 0353 03/11/21 1908 03/12/21 0306 03/13/21 0251 04/15/21 1433 04/25/21 1547 04/26/21 0552  BUN 23 21 24* 22 24* 22 19 30* 26* 20  CREATININE 1.04 1.12 1.17 1.10 1.28* 1.15 1.22 1.21 1.52* 1.21  AKI likely due to hypovolemia, dehydration and severe sepsis.  Also on lisinopril/HCTZ -Continue holding lisinopril/HCTZ. -Continue IV fluid-changed LR to NS with KCl given hyponatremia -Recheck renal function in the morning.  Prostate cancer metastatic to multiple sites [facility]) S/p  radical prostatectomy in 1992.  Recent imaging showed bulky retroperitoneal adenopathy consistent with metastatic prostate cancer.  Repeat imaging similar to prior.  Follows with urology, Dr. Tresa Endo, with [facility].  Scheduled to see oncology, Dr. Alen Blew, on 05/01/2021.  Patient with chronic Foley. -Outpatient follow-up with urology and oncology. -Foley exchanged by urology  CAD-S/P PCI/DES Stable, denies any chest pain.   -Continue aspirin 81 mg daily.   -Not currently on statin.  Moderate late onset Alzheimer's  dementia ([place]) Has been having worsening short-term memory impairment.  Following with neurology. -Continue Namenda -Delirium precautions  Normocytic anemia Recent Labs    04/30/20 1457 05/01/20 0350 05/02/20 0334 05/03/20 0353 03/11/21 1908 03/12/21 0306 03/13/21 0251 04/15/21 1433 04/25/21 1547 04/26/21 0552  HGB 10.9* 10.7* 10.4* 9.7* 13.3 11.3* 10.9* 12.7* 9.2* 9.2*  H&H slightly lower than recent baseline likely from hemodilution with IV fluid. -Continue monitoring   Lab test positive for detection of COVID-19 virus Tested positive on 04/15/2021.  No hypoxemia, respiratory distress or radiologic evidence of pneumonia.  He was discharged on Paxlovid. -Discontinue isolation precaution.  Essential hypertension Normotensive off antihypertensive medication.  Does not look like he was taking amlodipine or Coreg -Continue holding lisinopril and HCTZ in the setting of hyponatremia and AKI.  Pancreatic mass Stable 1.3 cm hypodense mass at the pancreatic head again noted on CT imaging. -Outpatient follow-up with oncology  Renal mass, right Lateral right kidney enhancing exophytic mass again seen on CT imaging concerning for renal cell carcinoma. -Outpatient follow-up with urology and oncology         Body mass index is 20.98 kg/m.         DVT prophylaxis:  heparin injection 5,000 Units Start: 04/26/21 0600  Code Status: DNR/DNI Family Communication: Updated patient's son over the phone Level of care: Med-Surg Status is: Inpatient Remains inpatient appropriate because: Severe sepsis due to catheter associated urinary tract infection, hyponatremia   Final disposition: TBD after PT/OT eval.    Consultants:  Urology   Sch Meds:  Scheduled Meds:  aspirin EC  81 mg Oral Daily   Chlorhexidine Gluconate Cloth  6 each Topical Daily   Chlorhexidine Gluconate Cloth  6 each Topical Daily   heparin  5,000 Units Subcutaneous Q8H   melatonin  3 mg Oral QHS    memantine  10 mg Oral BID   Continuous Infusions:  0.9 % NaCl with KCl 20 mEq / L 100 mL/hr at 04/26/21 0844   cefTRIAXone (ROCEPHIN)  IV Stopped (04/25/21 1902)   PRN Meds:.acetaminophen **OR** acetaminophen, ondansetron **OR** ondansetron (ZOFRAN) IV, senna-docusate  Antimicrobials: Anti-infectives (From admission, onward)    Start     Dose/Rate Route Frequency Ordered Stop   04/25/21 1815  cefTRIAXone (ROCEPHIN) 2 g in sodium chloride 0.9 % 100 mL IVPB        2 g 200 mL/hr over 30 Minutes Intravenous Every 24 hours 04/25/21 1811 05/02/21 1759        I have personally reviewed the following labs and images: CBC: Recent Labs  Lab 04/25/21 1547 04/26/21 0552  WBC 18.3* 15.5*  NEUTROABS 15.4*  --   HGB 9.2* 9.2*  HCT 29.4* 29.0*  MCV 89.1 90.9  PLT 493* 421*   BMP &GFR Recent Labs  Lab 04/25/21 1547 04/26/21 0552  NA 129* 130*  K 3.9 3.6  CL 94* 97*  CO2 24 23  GLUCOSE 136* 116*  BUN 26* 20  CREATININE 1.52* 1.21  CALCIUM 9.0 8.1*   Estimated Creatinine Clearance: 36.6 mL/min (by C-G formula based on SCr of 1.21 mg/dL). Liver & Pancreas: Recent Labs  Lab 04/25/21 1547  AST 21  ALT 16  ALKPHOS 91  BILITOT 0.5  PROT 6.5  ALBUMIN 3.2*   No results for input(s): LIPASE, AMYLASE in the last 168 hours. No results for input(s): AMMONIA in the last 168 hours. Diabetic: No results for input(s): HGBA1C in the last 72 hours. No results for input(s): GLUCAP in the last 168 hours. Cardiac Enzymes: No results for input(s): CKTOTAL, CKMB, CKMBINDEX, TROPONINI in the last 168 hours. No results for input(s): PROBNP in the last 8760 hours. Coagulation Profile: Recent Labs  Lab 04/25/21 1547  INR 1.2   Thyroid Function Tests: No results for input(s): TSH, T4TOTAL, FREET4, T3FREE, THYROIDAB in the last 72 hours. Lipid Profile: No results for input(s): CHOL, HDL, LDLCALC, TRIG, CHOLHDL, LDLDIRECT in the last 72 hours. Anemia Panel: No results for input(s):  VITAMINB12, FOLATE, FERRITIN, TIBC, IRON, RETICCTPCT in the last 72 hours. Urine analysis:    Component Value Date/Time   COLORURINE YELLOW 04/25/2021 1547   APPEARANCEUR HAZY (A) 04/25/2021 1547   LABSPEC 1.029 04/25/2021 1547   PHURINE 6.5 04/25/2021 1547   GLUCOSEU NEGATIVE 04/25/2021 1547   HGBUR MODERATE (A) 04/25/2021 1547   BILIRUBINUR NEGATIVE 04/25/2021 1547   KETONESUR NEGATIVE 04/25/2021 1547   PROTEINUR 30 (A) 04/25/2021 1547   UROBILINOGEN 0.2 05/15/2007 1118   NITRITE POSITIVE (A) 04/25/2021 1547   LEUKOCYTESUR LARGE (A) 04/25/2021 1547   Sepsis Labs: Invalid input(s): PROCALCITONIN, [place]  Microbiology: Recent Results (from the past 240 hour(s))  Blood Culture (routine x 2)     Status: None (Preliminary result)   Collection Time: 04/25/21  3:47 PM   Specimen: Right Antecubital; Blood  Result Value Ref Range Status   Specimen Description   Final    RIGHT ANTECUBITAL Performed at [facility], [address], [place], [place] [address]    Special Requests   Final    BOTTLES DRAWN AEROBIC AND ANAEROBIC Blood Culture adequate volume Performed at [facility], [address], [place], [place] [address]    Culture   Final    NO GROWTH < 24 HOURS Performed at [facility], [place] [address] [place], [place] [address]    Report Status PENDING  Incomplete  Resp Panel by RT-PCR (Flu A&B, Covid) Nasopharyngeal Swab     Status: Abnormal   Collection Time: 04/25/21  3:47 PM   Specimen: Nasopharyngeal Swab; Nasopharyngeal(NP) swabs in vial transport medium  Result Value Ref Range Status   SARS Coronavirus 2 by RT PCR POSITIVE (A) NEGATIVE Final    Comment: (NOTE) SARS-CoV-2 target nucleic acids are DETECTED.  The SARS-CoV-2 RNA is generally detectable in upper respiratory specimens during the acute phase of infection. Positive results are indicative of the presence of the identified virus, but do not rule out  bacterial infection or co-infection with other pathogens not detected by the test. Clinical correlation with patient history and other diagnostic information is necessary to determine patient infection status. The expected result is Negative.  Fact Sheet for Patients: EntrepreneurPulse.com.au  Fact Sheet for Healthcare Providers: IncredibleEmployment.be  This test is not yet approved or cleared by the [place] FDA and  has been authorized for detection and/or diagnosis of SARS-CoV-2 by FDA under an Emergency Use Authorization (EUA).  This EUA will remain in effect (meaning this test can be used) for the duration of  the COVID-19 declaration under Section 564(b)(1) of the A ct, 21 U.S.C. section 360bbb-3(b)(1), unless the authorization is terminated or revoked sooner.  Influenza A by PCR NEGATIVE NEGATIVE Final   Influenza B by PCR NEGATIVE NEGATIVE Final    Comment: (NOTE) The Xpert Xpress SARS-CoV-2/FLU/RSV plus assay is intended as an aid in the diagnosis of influenza from Nasopharyngeal swab specimens and should not be used as a sole basis for treatment. Nasal washings and aspirates are unacceptable for Xpert Xpress SARS-CoV-2/FLU/RSV testing.  Fact Sheet for Patients: EntrepreneurPulse.com.au  Fact Sheet for Healthcare Providers: IncredibleEmployment.be  This test is not yet approved or cleared by the [place] FDA and has been authorized for detection and/or diagnosis of SARS-CoV-2 by FDA under an Emergency Use Authorization (EUA). This EUA will remain in effect (meaning this test can be used) for the duration of the COVID-19 declaration under Section 564(b)(1) of the Act, 21 U.S.C. section 360bbb-3(b)(1), unless the authorization is terminated or revoked.  Performed at [facility], [address] [place], [place] [address]   Blood Culture (routine x 2)      Status: None (Preliminary result)   Collection Time: 04/25/21  7:08 PM   Specimen: BLOOD  Result Value Ref Range Status   Specimen Description   Final    BLOOD LEFT ANTECUBITAL Performed at [facility], [address] [place], [place] [address]    Special Requests   Final    BOTTLES DRAWN AEROBIC AND ANAEROBIC Blood Culture adequate volume Performed at [facility], [address], [place], [place] [address]    Culture   Final    NO GROWTH < 12 HOURS Performed at [facility] [address], [place], [place] [address]    Report Status PENDING  Incomplete    Radiology Studies: CT ABDOMEN PELVIS W CONTRAST  Result Date: 04/25/2021 CLINICAL DATA:  Sepsis. Redness swelling and pain in penis and mons pubis. Intermittent fever and UTI symptoms for 1 week. Foley catheter in place. Testicular swelling and penile discomfort. History of prostate cancer, penile prosthesis implant, and hernia repair. EXAM: CT ABDOMEN AND PELVIS WITH CONTRAST TECHNIQUE: Multidetector CT imaging of the abdomen and pelvis was performed using the standard protocol following bolus administration of intravenous contrast. RADIATION DOSE REDUCTION: This exam was performed according to the departmental dose-optimization program which includes automated exposure control, adjustment of the mA and/or kV according to patient size and/or use of iterative reconstruction technique. CONTRAST:  24mL OMNIPAQUE IOHEXOL 300 MG/ML  SOLN COMPARISON:  Whole-body nuclear bone scan 03/13/2021; CT abdomen and pelvis 03/12/2021; MRI abdomen 02/15/2019 FINDINGS: Lower chest: Emphysematous changes are again seen. Mild bibasilar posterior dependent subpleural curvilinear subsegmental atelectasis versus scarring. Hepatobiliary: Smooth liver contours. No focal liver mass is identified. The gallbladder is unremarkable. No intrahepatic or extrahepatic biliary ductal dilatation. Pancreas: A hypodense 1.3 cm  mass is again unchanged within the pancreatic head also unchanged from MRI abdomen 02/15/2019. Within the pancreatic head, there is again unchanged mild ductal dilatation up to 4 mm (axial series 2, image 19). Spleen: Normal in size without focal abnormality. Adrenals/Urinary Tract: Adrenal glands are unremarkable. The kidneys enhance uniformly and are symmetric in size without hydronephrosis. No renal stone is seen. No significant change in 2.5 x 1.7 x 2.4 cm (transverse by craniocaudal) soft tissue enhancing mass within the lateral midpole of the right kidney, again partially exophytic. This is again highly suspicious for renal cell carcinoma. Several small hypodense renal likely cortical cysts are again seen bilaterally. Urinary bladder is decompressed by Foley catheter limiting evaluation. Stomach/Bowel: High-grade sigmoid and mild descending diverticulosis without definite inflammatory change  to indicate acute diverticulitis. High-grade stool throughout the colon compatible with constipation. The terminal ileum is unremarkable. The appendix is retrocecal and appears within normal limits. No dilated loops of bowel to indicate bowel obstruction. Vascular/Lymphatic: No abdominal aortic aneurysm. Mild-to-moderate atherosclerotic calcifications. Retroperitoneal 2.8 by 1.8 cm enlarged lymph node posterior to the aorta (axial series 2, image 27) is unchanged. Distal left external iliac chain 3.2 by 2.1 cm lymph node is unchanged when measured in a similar manner. Multiple enlarged left inguinal lymph nodes are again seen measuring up to 2.2 by 1.3 cm (axial image 59), unchanged. Reproductive: Postsurgical changes are again seen prostatectomy with numerous pelvic surgical clips from bilateral pelvic lymph node dissection. Penile implant is seen with1 reservoir within the anterior lower abdomen/pelvis. There is left-greater-than-right penile soft tissue swelling with likely subcutaneous dense fluid (axial series 2,  image 75). Previously less of the penis was imaged and is unclear whether the current swelling was partially included on the prior CT. Other: No abdominal wall hernia or abnormality.No free air or free fluid is seen within the abdomen or pelvis. No intra-abdominal or pelvic rim enhancing abscess is seen. Musculoskeletal: Mild dextrocurvature centered at L1 and mild levocurvature centered at L3. Severe multilevel degenerative disc changes. Mild retrolisthesis of L1 on L2 and L2 on L3 and grade 1 anterolisthesis of L5 on S1. IMPRESSION:: IMPRESSION: 1. There is penile and scrotal soft tissue swelling and edema. No abscess or free fluid is seen within the pelvis. 2. Otherwise, no significant change from 03/12/2021 CT. 3. Stable pancreatic head 1.3 cm hypodense mass. This demonstrates stability on multiple prior CTs and MRI 02/15/2019. 4. Lateral right kidney enhancing exophytic mass again concerning for renal cell carcinoma. 5. Stable bulky retroperitoneal lymphadenopathy and left inguinal adenopathy concerning for metastatic disease. 6. Postsurgical changes of prostatectomy and pelvic lymph node dissection. Electronically Signed   By: Yvonne Kendall M.D.   On: 04/25/2021 17:30   DG Chest Port 1 View  Result Date: 04/25/2021 CLINICAL DATA:  UTI symptoms, intermittent fever EXAM: PORTABLE CHEST 1 VIEW COMPARISON:  Chest radiograph 04/15/2021 FINDINGS: The cardiomediastinal silhouette is stable. There is no focal consolidation or pulmonary edema. There is no pleural effusion or pneumothorax There is no acute osseous abnormality. Reverse shoulder arthroplasty is again noted on the right. IMPRESSION: Stable chest with no radiographic evidence of acute cardiopulmonary process. Electronically Signed   By: Valetta Mole M.D.   On: 04/25/2021 15:45      Serenity Fortner T. [place]  If 7PM-7AM, please contact night-coverage [email or web address] 04/26/2021, 12:49 PM

## 2021-04-26 NOTE — Assessment & Plan Note (Addendum)
Normotensive off antihypertensive medication.  Does not look like he was taking amlodipine or Coreg ?-Lisinopril, HCTZ and amlodipine discontinued. ?-Continue home Coreg given history of CAD ?

## 2021-04-26 NOTE — Assessment & Plan Note (Addendum)
7Meets criteria with fever, leukocytosis, AKI and catheter associated E. coli UTI and patient with chronic indwelling Foley.  No lactic acidosis.   Blood cultures NGTD.  Urine culture with Klebsiella pneumonia and Staph aureus.  Foley changed on 3/3.  Sepsis physiology resolved. ?-Received IV ceftriaxone for 3 days in-house ?-Discharged on p.o. cefadroxil 500 mg twice daily for 7 more days ?-Outpatient follow-up with his urologist ?

## 2021-04-26 NOTE — Evaluation (Signed)
Occupational Therapy Evaluation ?Patient Details ?Name: Andre Casey. ?MRN: [id number] ?DOB: 1930/02/03 ?Today's Date: 04/26/2021 ? ? ?History of Present Illness Andre Casey. is a 88 y.o. male with medical history significant for metastatic prostate cancer (s/p radical prostatectomy in 1992), chronic indwelling Foley catheter, CAD s/p PCI with DES to LAD 12/2015, HTN, HLD, CKD stage IIIa, LBBB, s/p implanted penile prosthesis, moderate cognitive impairment who is admitted with with UTI and AKI on CKD stage IIIa.  ? ?Clinical Impression ?  ?[name] is an 88 year old man who presents with above medical history. On evaluation he is alert and oriented but states he has a bad memory. He reports pain due to catheter - and reports it always hurts. Patient min assist to transfer out of bed and min guard to ambulate in hall and in room. He was able to don his socks and stand at sink  to wash his face. He fatigued quickly but overall min guard for everything. Will follow acutely to improve independence in order to return home at discharge. He reports assistance of step daughter and step granddaughter who help his wife but unsure of how much assistance they have. He overall appears functional and safe.  ?   ? ?Recommendations for follow up therapy are one component of a multi-disciplinary discharge planning process, led by the attending physician.  Recommendations may be updated based on patient status, additional functional criteria and insurance authorization.  ? ?Follow Up Recommendations ? No OT follow up  ?  ?Assistance Recommended at Discharge Intermittent Supervision/Assistance  ?Patient can return home with the following A little help with bathing/dressing/bathroom;Assistance with cooking/housework;Direct supervision/assist for medications management;Direct supervision/assist for financial management ? ?  ?Functional Status Assessment ? Patient has had a recent decline in their  functional status and demonstrates the ability to make significant improvements in function in a reasonable and predictable amount of time.  ?Equipment Recommendations ? None recommended by OT  ?  ?Recommendations for Other Services   ? ? ?  ?Precautions / Restrictions Precautions ?Precautions: Other (comment) ?Precaution Comments: painful catheter ?Restrictions ?Weight Bearing Restrictions: No  ? ?  ? ?Mobility Bed Mobility ?  ?  ?  ?  ?  ?  ?  ?  ?  ? ?Transfers ?  ?  ?  ?  ?  ?  ?  ?  ?  ?  ?  ? ?  ?Balance Overall balance assessment: Mild deficits observed, not formally tested ?  ?  ?  ?  ?  ?  ?  ?  ?  ?  ?  ?  ?  ?  ?  ?  ?  ?  ?   ? ?ADL either performed or assessed with clinical judgement  ? ?ADL Overall ADL's : Needs assistance/impaired ?Eating/Feeding: Independent ?  ?Grooming: Standing;Wash/dry face ?Grooming Details (indicate cue type and reason): washed face at sink ?Upper Body Bathing: Supervision/ safety ?  ?Lower Body Bathing: Min guard;Sit to/from stand ?  ?Upper Body Dressing : Independent ?  ?Lower Body Dressing: Min guard;Sit to/from stand ?  ?Toilet Transfer: Copy ?  ?Toileting- Water quality scientist and Hygiene: Min guard;Sit to/from stand ?  ?  ?  ?Functional mobility during ADLs: Min guard ?General ADL Comments: Min guard to ambulate in room and hall without a device. gait is slightly altered due to penile pain from catheter. No overt loss of balance but fatigued quickly.  ? ? ? ?  Vision   ?Vision Assessment?: No apparent visual deficits  ?   ?Perception   ?  ?Praxis   ?  ? ?Pertinent Vitals/Pain Pain Assessment ?Pain Assessment: Faces ?Faces Pain Scale: Hurts even more ?Pain Location: Penis (catheter ) ?Pain Descriptors / Indicators: Grimacing, Guarding ?Pain Intervention(s): Monitored during session  ? ? ? ?Hand Dominance Right ?  ?Extremity/Trunk Assessment Upper Extremity Assessment ?Upper Extremity Assessment: Overall WFL for tasks assessed ?  ?Lower  Extremity Assessment ?Lower Extremity Assessment: Defer to PT evaluation ?  ?Cervical / Trunk Assessment ?Cervical / Trunk Assessment: Normal ?  ?Communication Communication ?Communication: HOH ?  ?Cognition Arousal/Alertness: Awake/alert ?Behavior During Therapy: [facility] for tasks assessed/performed ?Overall Cognitive Status: History of cognitive impairments - at baseline ?  ?  ?  ?  ?  ?  ?  ?  ?  ?  ?  ?  ?  ?  ?  ?  ?General Comments: Decreased short term memory but alert x 3 ?  ?  ?General Comments    ? ?  ?Exercises   ?  ?Shoulder Instructions    ? ? ?Home Living Family/patient expects to be discharged to:: Private residence ?Living Arrangements: Spouse/significant other ?Available Help at Discharge: Available 24 hours/day ?Type of Home: House ?Home Access: Stairs to enter ?Entrance Stairs-Number of Steps: 4 ?Entrance Stairs-Rails: Right;Left ?Home Layout: One level ?  ?  ?Bathroom Shower/Tub: Walk-in shower ?  ?Bathroom Toilet: Standard ?Bathroom Accessibility: Yes ?How Accessible: Accessible via walker ?Home Equipment: Conservation officer, nature (2 wheels);Cane - single point;Wheelchair - manual ?  ?Additional Comments: wife requires assistance with ADLs has grandchildren that help her during the day. No caregivers at night. he says they help with "things" - I'm assuming groceries. ?  ? ?  ?Prior Functioning/Environment Prior Level of Function : Independent/Modified Independent ?  ?  ?  ?  ?  ?  ?Mobility Comments: no device ?ADLs Comments: independent ?  ? ?  ?  ?OT Problem List: Decreased activity tolerance;Impaired balance (sitting and/or standing);Decreased knowledge of use of DME or AE;Decreased knowledge of precautions;Pain ?  ?   ?OT Treatment/Interventions: Self-care/ADL training;DME and/or AE instruction;Therapeutic activities;Balance training;Patient/family education  ?  ?OT Goals(Current goals can be found in the care plan section) Acute Rehab OT Goals ?Patient Stated Goal: to go home to wife ?OT Goal  Formulation: With patient ?Time For Goal Achievement: 05/10/21 ?Potential to Achieve Goals: Good  ?OT Frequency: Min 2X/week ?  ? ?Co-evaluation   ?  ?  ?  ?  ? ?  ?AM-PAC OT "6 Clicks" Daily Activity     ?Outcome Measure Help from another person eating meals?: None ?Help from another person taking care of personal grooming?: A Little ?Help from another person toileting, which includes using toliet, bedpan, or urinal?: A Little ?Help from another person bathing (including washing, rinsing, drying)?: A Little ?Help from another person to put on and taking off regular upper body clothing?: A Little ?Help from another person to put on and taking off regular lower body clothing?: A Little ?6 Click Score: 19 ?  ?End of Session Nurse Communication: Mobility status ? ?Activity Tolerance: Patient tolerated treatment well ?Patient left: in chair;with call bell/phone within reach ? ?OT Visit Diagnosis: Unsteadiness on feet (R26.81)  ?              ?Time: 7035-0093 ?OT Time Calculation (min): 19 min ?Charges:  OT General Charges ?$OT Visit: 1 Visit ?OT Evaluation ?$OT Eval Low Complexity: 1 Low ? ?  Trevyn Lumpkin, OTR/L ?Acute Care Rehab Services  ?Office [phone] ?Pager: [phone]  ? ?Alyviah Crandle L Ramesh Moan ?04/26/2021, 3:23 PM ?

## 2021-04-26 NOTE — Progress Notes (Signed)
PT Cancellation Note ? ?Patient Details ?Name: Andre Casey. ?MRN: [id number] ?DOB: Sep 02, 1929 ? ? ?Cancelled Treatment:    Reason Eval/Treat Not Completed: Other (comment), patient just returned to bed. Will check back tomorrow. ? ? ?Marko Skalski, Shella Maxim ?04/26/2021, 3:25 PM ?Tresa Endo PT ?Acute Rehabilitation Services ?Pager [phone] ?Office [phone] ? ? ?

## 2021-04-27 DIAGNOSIS — D72825 Bandemia: Secondary | ICD-10-CM

## 2021-04-27 LAB — CBC
HCT: 27.6 % — ABNORMAL LOW (ref 39.0–52.0)
Hemoglobin: 8.6 g/dL — ABNORMAL LOW (ref 13.0–17.0)
MCH: 28.5 pg (ref 26.0–34.0)
MCHC: 31.2 g/dL (ref 30.0–36.0)
MCV: 91.4 fL (ref 80.0–100.0)
Platelets: 440 10*3/uL — ABNORMAL HIGH (ref 150–400)
RBC: 3.02 MIL/uL — ABNORMAL LOW (ref 4.22–5.81)
RDW: 14 % (ref 11.5–15.5)
WBC: 11.7 10*3/uL — ABNORMAL HIGH (ref 4.0–10.5)
nRBC: 0 % (ref 0.0–0.2)

## 2021-04-27 LAB — MAGNESIUM: Magnesium: 2.2 mg/dL (ref 1.7–2.4)

## 2021-04-27 LAB — RENAL FUNCTION PANEL
Albumin: 2.1 g/dL — ABNORMAL LOW (ref 3.5–5.0)
Anion gap: 7 (ref 5–15)
BUN: 15 mg/dL (ref 8–23)
CO2: 21 mmol/L — ABNORMAL LOW (ref 22–32)
Calcium: 7.7 mg/dL — ABNORMAL LOW (ref 8.9–10.3)
Chloride: 102 mmol/L (ref 98–111)
Creatinine, Ser: 0.92 mg/dL (ref 0.61–1.24)
GFR, Estimated: >60 mL/min (ref >60)
Glucose, Bld: 104 mg/dL — ABNORMAL HIGH (ref 70–99)
Phosphorus: 2.7 mg/dL (ref 2.5–4.6)
Potassium: 4.2 mmol/L (ref 3.5–5.1)
Sodium: 130 mmol/L — ABNORMAL LOW (ref 135–145)

## 2021-04-27 NOTE — Assessment & Plan Note (Addendum)
Likely from infection and dehydration.  Resolved. ?

## 2021-04-27 NOTE — Progress Notes (Signed)
Physical Therapy Treatment ?Patient Details ?Name: Andre Casey. ?MRN: [id number] ?DOB: 1929/07/14 ?Today's Date: 04/27/2021 ? ? ?History of Present Illness Andre Casey. is a 88 y.o. male with medical history significant for metastatic prostate cancer (s/p radical prostatectomy in 1992), chronic indwelling Foley catheter, CAD s/p PCI with DES to LAD 12/2015, HTN, HLD, CKD stage IIIa, LBBB, s/p implanted penile prosthesis, moderate cognitive impairment who is admitted with with UTI and AKI on CKD stage IIIa. ? ?  ?PT Comments  ? ? Pt reports his wife is at rehab, which is different than what he said this AM.  He would be safest with some level of supervision for higher level activities. At this time recommend HHPT if he is going to have some assistance from family.    ?Recommendations for follow up therapy are one component of a multi-disciplinary discharge planning process, led by the attending physician.  Recommendations may be updated based on patient status, additional functional criteria and insurance authorization. ? ?Follow Up Recommendations ? Home health PT ?  ?  ?Assistance Recommended at Discharge Intermittent Supervision/Assistance  ?Patient can return home with the following A little help with walking and/or transfers;A little help with bathing/dressing/bathroom ?  ?Equipment Recommendations ? None recommended by PT  ?  ?Recommendations for Other Services   ? ? ?  ?Precautions / Restrictions Precautions ?Precautions: Fall;Other (comment) ?Precaution Comments: painful catheter ?Restrictions ?Weight Bearing Restrictions: No  ?  ? ?Mobility ? Bed Mobility ?Overal bed mobility: Modified Independent ?  ?  ?  ?  ?  ?  ?General bed mobility comments: no A from PT, but did require use of bed rail to get to EOB. ?  ? ?Transfers ?Overall transfer level: Needs assistance ?Equipment used: None ?Transfers: Sit to/from Stand ?Sit to Stand: Min guard ?Stand pivot transfers: Min guard ?  ?  ?  ?   ?General transfer comment: cues to not pull on IV pole ?  ? ?Ambulation/Gait ?Ambulation/Gait assistance: Min guard ?Gait Distance (Feet): 140 Feet ?Assistive device: IV Pole ?Gait Pattern/deviations: Narrow base of support, Decreased step length - right, Decreased step length - left ?  ?  ?  ?General Gait Details: No LOB, but did seem reliant on IV pole for stability ? ? ?Stairs ?  ?  ?  ?  ?  ? ? ?Wheelchair Mobility ?  ? ?Modified Rankin (Stroke Patients Only) ?  ? ? ?  ?Balance Overall balance assessment: Needs assistance ?  ?  ?  ?  ?Standing balance support: During functional activity ?Standing balance-Leahy Scale: Fair ?Standing balance comment: Pt needed assistance after using BSC ?  ?  ?  ?  ?  ?  ?  ?  ?  ?  ?  ?  ? ?  ?Cognition Arousal/Alertness: Awake/alert ?Behavior During Therapy: [facility] for tasks assessed/performed ?Overall Cognitive Status: No family/caregiver present to determine baseline cognitive functioning ?  ?  ?  ?  ?  ?  ?  ?  ?  ?  ?  ?  ?  ?  ?  ?  ?General Comments: some memory deficits noted, but alert ?  ?  ? ?  ?Exercises   ? ?  ?General Comments General comments (skin integrity, edema, etc.): Pt did report some irritation at rectum with being cleaned ?  ?  ? ?Pertinent Vitals/Pain Pain Assessment ?Pain Assessment: Faces ?Faces Pain Scale: Hurts a little bit ?Pain Location: Penis (catheter ) ?Pain Descriptors / Indicators:  Grimacing, Guarding  ? ? ?Home Living Family/patient expects to be discharged to:: Private residence ?Living Arrangements: Spouse/significant other ?Available Help at Discharge: Available 24 hours/day ?Type of Home: House ?Home Access: Stairs to enter ?Entrance Stairs-Rails: Right;Left ?Entrance Stairs-Number of Steps: 4 ?  ?Home Layout: One level ?Home Equipment: Conservation officer, nature (2 wheels);Cane - single point;Wheelchair - manual ?Additional Comments: Thinks someone maybe able to come and help for a few days at time of d/c, but unsure  ?  ?Prior Function    ?  ?  ?    ? ?PT Goals (current goals can now be found in the care plan section) Acute Rehab PT Goals ?PT Goal Formulation: With patient ?Time For Goal Achievement: 05/11/21 ?Potential to Achieve Goals: Good ?Progress towards PT goals: Progressing toward goals ? ?  ?Frequency ? ? ? Min 3X/week ? ? ? ?  ?PT Plan Current plan remains appropriate  ? ? ?Co-evaluation   ?  ?  ?  ?  ? ?  ?AM-PAC PT "6 Clicks" Mobility   ?Outcome Measure ? Help needed turning from your back to your side while in a flat bed without using bedrails?: A Lot ?Help needed moving from lying on your back to sitting on the side of a flat bed without using bedrails?: A Lot ?Help needed moving to and from a bed to a chair (including a wheelchair)?: A Little ?Help needed standing up from a chair using your arms (e.g., wheelchair or bedside chair)?: A Little ?Help needed to walk in hospital room?: A Little ?Help needed climbing 3-5 steps with a railing? : A Little ?6 Click Score: 16 ? ?  ?End of Session Equipment Utilized During Treatment: Gait belt ?Activity Tolerance: Patient tolerated treatment well ?Patient left: in bed;with call bell/phone within reach;with bed alarm set ?Nurse Communication: Mobility status ?PT Visit Diagnosis: Other abnormalities of gait and mobility (R26.89) ?  ? ? ?Time: 7673-4193 ?PT Time Calculation (min) (ACUTE ONLY): 24 min ? ?Charges:  $Gait Training: 8-22 mins ?$Therapeutic Activity: 8-22 mins          ?          ? ?Santiago Glad L. Tamala Julian, PT ? ?04/27/2021 ? ? ? ?[name] ?04/27/2021, 4:14 PM ? ?

## 2021-04-27 NOTE — Evaluation (Signed)
Physical Therapy Evaluation ?Patient Details ?Name: Andre Casey. ?MRN: [id number] ?DOB: [birth year] ?Today's Date: 04/27/2021 ? ?History of Present Illness ? Andre Dorian. is a 88 y.o. male with medical history significant for metastatic prostate cancer (s/p radical prostatectomy in 1992), chronic indwelling Foley catheter, CAD s/p PCI with DES to LAD 12/2015, HTN, HLD, CKD stage IIIa, LBBB, s/p implanted penile prosthesis, moderate cognitive impairment who is admitted with with UTI and AKI on CKD stage IIIa.  ?Clinical Impression ? Pt admitted with above diagnosis.  Pt currently with functional limitations due to the deficits listed below (see PT Problem List). Pt will benefit from skilled PT to increase their independence and safety with mobility to allow discharge to the venue listed below.  Evaluation was limited by soiling and needing to be cleaned and further use the restroom.  Will continue to assess his mobility, but will likely need HHPT. ?   ?   ? ?Recommendations for follow up therapy are one component of a multi-disciplinary discharge planning process, led by the attending physician.  Recommendations may be updated based on patient status, additional functional criteria and insurance authorization. ? ?Follow Up Recommendations Home health PT ? ?  ?Assistance Recommended at Discharge Intermittent Supervision/Assistance  ?Patient can return home with the following ? A little help with walking and/or transfers;A little help with bathing/dressing/bathroom ? ?  ?Equipment Recommendations    ?Recommendations for Other Services ?    ?  ?Functional Status Assessment Patient has had a recent decline in their functional status and demonstrates the ability to make significant improvements in function in a reasonable and predictable amount of time.  ? ?  ?Precautions / Restrictions Precautions ?Precautions: Other (comment) ?Precaution Comments: painful catheter ?Restrictions ?Weight Bearing  Restrictions: No  ? ?  ? ?Mobility ? Bed Mobility ?Overal bed mobility: Modified Independent ?  ?  ?  ?  ?  ?  ?General bed mobility comments: no A from PT, but did require use of bed rail to get to EOB. ?  ? ?Transfers ?Overall transfer level: Needs assistance ?Equipment used: None ?Transfers: Sit to/from Stand, Bed to chair/wheelchair/BSC ?Sit to Stand: Min guard ?Stand pivot transfers: Min guard ?  ?  ?  ?  ?General transfer comment: min/guard for safety, but then noted to have had a BM.  Nurse Tech came into clean while PT stood with pt and he continued to have more BM. Urologist came in ans assessed while pt standing. Pt then stated he felt like he needed to have a BM and transferred to the [facility] and left with nurse tech. ?  ? ?Ambulation/Gait ?  ?  ?  ?  ?  ?  ?  ?General Gait Details: NT ? ?Stairs ?  ?  ?  ?  ?  ? ?Wheelchair Mobility ?  ? ?Modified Rankin (Stroke Patients Only) ?  ? ?  ? ?Balance Overall balance assessment: Mild deficits observed, not formally tested ?  ?  ?  ?  ?  ?  ?  ?  ?  ?  ?  ?  ?  ?  ?  ?  ?  ?  ?   ? ? ? ?Pertinent Vitals/Pain Pain Assessment ?Pain Assessment: Faces ?Faces Pain Scale: Hurts little more ?Pain Location: Penis (catheter ) ?Pain Descriptors / Indicators: Grimacing, Guarding  ? ? ?Home Living Family/patient expects to be discharged to:: Private residence ?Living Arrangements: Spouse/significant other ?Available Help at Discharge: Available 24 hours/day ?Type  of Home: House ?Home Access: Stairs to enter ?Entrance Stairs-Rails: Right;Left ?Entrance Stairs-Number of Steps: 4 ?  ?Home Layout: One level ?Home Equipment: Conservation officer, nature (2 wheels);Cane - single point;Wheelchair - manual ?Additional Comments: Thinks someone maybe able to come and help for a few days at time of d/c, but unsure  ?  ?Prior Function Prior Level of Function : Independent/Modified Independent ?  ?  ?  ?  ?  ?  ?Mobility Comments: no device ?ADLs Comments: independent ?  ? ? ?Hand Dominance  ? Dominant  Hand: Right ? ?  ?Extremity/Trunk Assessment  ? Upper Extremity Assessment ?Upper Extremity Assessment: Defer to OT evaluation ?  ? ?Lower Extremity Assessment ?Lower Extremity Assessment: Generalized weakness;Overall [facility] for tasks assessed ?  ? ?Cervical / Trunk Assessment ?Cervical / Trunk Assessment: Normal  ?Communication  ? Communication: HOH  ?Cognition Arousal/Alertness: Awake/alert ?Behavior During Therapy: [facility] for tasks assessed/performed ?Overall Cognitive Status: No family/caregiver present to determine baseline cognitive functioning ?  ?  ?  ?  ?  ?  ?  ?  ?  ?  ?  ?  ?  ?  ?  ?  ?General Comments: some memory deficits noted, but alert ?  ?  ? ?  ?General Comments General comments (skin integrity, edema, etc.): Pt did report some irritation at rectum with being cleaned ? ?  ?Exercises    ? ?Assessment/Plan  ?  ?PT Assessment Patient needs continued PT services  ?PT Problem List Decreased strength;Decreased mobility;Decreased balance ? ?   ?  ?PT Treatment Interventions DME instruction;Gait training;Stair training;Functional mobility training;Therapeutic activities;Therapeutic exercise;Balance training   ? ?PT Goals (Current goals can be found in the Care Plan section)  ?Acute Rehab PT Goals ?PT Goal Formulation: With patient ?Time For Goal Achievement: 05/11/21 ?Potential to Achieve Goals: Good ? ?  ?Frequency Min 3X/week ?  ? ? ?Co-evaluation   ?  ?  ?  ?  ? ? ?  ?AM-PAC PT "6 Clicks" Mobility  ?Outcome Measure Help needed turning from your back to your side while in a flat bed without using bedrails?: A Lot ?Help needed moving from lying on your back to sitting on the side of a flat bed without using bedrails?: A Lot ?Help needed moving to and from a bed to a chair (including a wheelchair)?: A Little ?Help needed standing up from a chair using your arms (e.g., wheelchair or bedside chair)?: A Little ?Help needed to walk in hospital room?: A Little ?Help needed climbing 3-5 steps with a railing? : A  Lot ?6 Click Score: 15 ? ?  ?End of Session   ?Activity Tolerance: Other (comment) (limited by needing to toilet) ?Patient left: with nursing/sitter in room;Other (comment) (on [facility]) ?Nurse Communication: Mobility status ?PT Visit Diagnosis: Other abnormalities of gait and mobility (R26.89) ?  ? ?Time: 0939-1000 ?PT Time Calculation (min) (ACUTE ONLY): 21 min ? ? ?Charges:   PT Evaluation ?$PT Eval Low Complexity: 1 Low ?  ?  ?   ? ? ?Santiago Glad L. Tamala Julian, PT ? ?04/27/2021 ? ? ?Andre Casey ?04/27/2021, 1:25 PM ? ?

## 2021-04-27 NOTE — Progress Notes (Signed)
[name] son, [name], called for update on patient status. All questions answered to satisfaction.  ?

## 2021-04-27 NOTE — Progress Notes (Signed)
Urology Progress Note  ? ?88 yo M w/ Hx of PCa s/p radical prostatectomy + salvage radiation, ED s/p IPP placement who is admitted for AKI, UTI. He is COVID positive. ? ?Subjective: ?- NAEON. ?- Low grade temp of 38.2C @ 2000, otherwise AF ?- Pain well controlled ?- GU exam stable ? ?Objective: ?Vital signs in last 24 hours: ?Temp:  [97.9 ?F (36.6 ?C)-100.7 ?F (38.2 ?C)] 99 ?F (37.2 ?C) (03/04 0518) ?Pulse Rate:  [65-84] 72 (03/04 0518) ?Resp:  [16-20] 16 (03/04 0518) ?BP: (113-148)/(53-72) 113/53 (03/04 0518) ?SpO2:  [93 %-99 %] 99 % (03/04 0518) ? ?Intake/Output from previous day: ?03/03 0701 - 03/04 0700 ?In: 2417.3 [P.O.:480; I.V.:1937.3] ?Out: 1328 [Urine:1325; Stool:3] ?Intake/Output this shift: ?No intake/output data recorded. ? ?Physical Exam:  ?General: Alert and oriented ?CV: Regular rate ?Lungs: No increased work of breathing ?Abdomen: Soft, non-tender, non-distended. ?GU: Foley in place draining clear yellow urine. Moderate swelling of the distal penile shaft. Mild swelling of scrotum. Scrotal pump mobile. Moderate tenderness of penile shaft and scrotum. ?Ext: NT, No erythema ? ?Lab Results: ?Recent Labs  ?  04/25/21 ?1547 04/26/21 ?8756 04/27/21 ?4332  ?HGB 9.2* 9.2* 8.6*  ?HCT 29.4* 29.0* 27.6*  ? ?Recent Labs  ?  04/26/21 ?9518 04/27/21 ?8416  ?NA 130* 130*  ?K 3.6 4.2  ?CL 97* 102  ?CO2 23 21*  ?GLUCOSE 116* 104*  ?BUN 20 15  ?CREATININE 1.21 0.92  ?CALCIUM 8.1* 7.7*  ? ? ?Studies/Results: ?CT ABDOMEN PELVIS W CONTRAST ? ?Result Date: 04/25/2021 ?CLINICAL DATA:  Sepsis. Redness swelling and pain in penis and mons pubis. Intermittent fever and UTI symptoms for 1 week. Foley catheter in place. Testicular swelling and penile discomfort. History of prostate cancer, penile prosthesis implant, and hernia repair. EXAM: CT ABDOMEN AND PELVIS WITH CONTRAST TECHNIQUE: Multidetector CT imaging of the abdomen and pelvis was performed using the standard protocol following bolus administration of intravenous  contrast. RADIATION DOSE REDUCTION: This exam was performed according to the departmental dose-optimization program which includes automated exposure control, adjustment of the mA and/or kV according to patient size and/or use of iterative reconstruction technique. CONTRAST:  76m OMNIPAQUE IOHEXOL 300 MG/ML  SOLN COMPARISON:  Whole-body nuclear bone scan 03/13/2021; CT abdomen and pelvis 03/12/2021; MRI abdomen 02/15/2019 FINDINGS: Lower chest: Emphysematous changes are again seen. Mild bibasilar posterior dependent subpleural curvilinear subsegmental atelectasis versus scarring. Hepatobiliary: Smooth liver contours. No focal liver mass is identified. The gallbladder is unremarkable. No intrahepatic or extrahepatic biliary ductal dilatation. Pancreas: A hypodense 1.3 cm mass is again unchanged within the pancreatic head also unchanged from MRI abdomen 02/15/2019. Within the pancreatic head, there is again unchanged mild ductal dilatation up to 4 mm (axial series 2, image 19). Spleen: Normal in size without focal abnormality. Adrenals/Urinary Tract: Adrenal glands are unremarkable. The kidneys enhance uniformly and are symmetric in size without hydronephrosis. No renal stone is seen. No significant change in 2.5 x 1.7 x 2.4 cm (transverse by craniocaudal) soft tissue enhancing mass within the lateral midpole of the right kidney, again partially exophytic. This is again highly suspicious for renal cell carcinoma. Several small hypodense renal likely cortical cysts are again seen bilaterally. Urinary bladder is decompressed by Foley catheter limiting evaluation. Stomach/Bowel: High-grade sigmoid and mild descending diverticulosis without definite inflammatory change to indicate acute diverticulitis. High-grade stool throughout the colon compatible with constipation. The terminal ileum is unremarkable. The appendix is retrocecal and appears within normal limits. No dilated loops of bowel to indicate bowel obstruction.  Vascular/Lymphatic: No abdominal aortic aneurysm. Mild-to-moderate atherosclerotic calcifications. Retroperitoneal 2.8 by 1.8 cm enlarged lymph node posterior to the aorta (axial series 2, image 27) is unchanged. Distal left external iliac chain 3.2 by 2.1 cm lymph node is unchanged when measured in a similar manner. Multiple enlarged left inguinal lymph nodes are again seen measuring up to 2.2 by 1.3 cm (axial image 59), unchanged. Reproductive: Postsurgical changes are again seen prostatectomy with numerous pelvic surgical clips from bilateral pelvic lymph node dissection. Penile implant is seen with1 reservoir within the anterior lower abdomen/pelvis. There is left-greater-than-right penile soft tissue swelling with likely subcutaneous dense fluid (axial series 2, image 75). Previously less of the penis was imaged and is unclear whether the current swelling was partially included on the prior CT. Other: No abdominal wall hernia or abnormality.No free air or free fluid is seen within the abdomen or pelvis. No intra-abdominal or pelvic rim enhancing abscess is seen. Musculoskeletal: Mild dextrocurvature centered at L1 and mild levocurvature centered at L3. Severe multilevel degenerative disc changes. Mild retrolisthesis of L1 on L2 and L2 on L3 and grade 1 anterolisthesis of L5 on S1. IMPRESSION:: IMPRESSION: 1. There is penile and scrotal soft tissue swelling and edema. No abscess or free fluid is seen within the pelvis. 2. Otherwise, no significant change from 03/12/2021 CT. 3. Stable pancreatic head 1.3 cm hypodense mass. This demonstrates stability on multiple prior CTs and MRI 02/15/2019. 4. Lateral right kidney enhancing exophytic mass again concerning for renal cell carcinoma. 5. Stable bulky retroperitoneal lymphadenopathy and left inguinal adenopathy concerning for metastatic disease. 6. Postsurgical changes of prostatectomy and pelvic lymph node dissection. Electronically Signed   By: Yvonne Kendall M.D.    On: 04/25/2021 17:30  ? ?DG Chest Port 1 View ? ?Result Date: 04/25/2021 ?CLINICAL DATA:  UTI symptoms, intermittent fever EXAM: PORTABLE CHEST 1 VIEW COMPARISON:  Chest radiograph 04/15/2021 FINDINGS: The cardiomediastinal silhouette is stable. There is no focal consolidation or pulmonary edema. There is no pleural effusion or pneumothorax There is no acute osseous abnormality. Reverse shoulder arthroplasty is again noted on the right. IMPRESSION: Stable chest with no radiographic evidence of acute cardiopulmonary process. Electronically Signed   By: Valetta Mole M.D.   On: 04/25/2021 15:45   ? ?Assessment/Plan: ? ?88 y.o. male w/ Hx of PCa s/p radical prostatectomy + salvage radiation, ED s/p IPP placement who is admitted for AKI, UTI. He is COVID positive.  Seems to be improving with antibiotic therapy (decreasing leukocytosis, afrebrile). GU exam is overall stable. Will continue to monitor.  ? - Continue Foley catheter ?- Continue broad antibiotics for UTI. Narrow based of culture sensitivities.  ?- Urology will continue to follow ? ? ? LOS: 2 days  ? ?Reola Mosher, MD ?[facility] Urology Resident, PGY4 ?[facility] ? ?

## 2021-04-27 NOTE — Progress Notes (Signed)
PROGRESS NOTE  Andre Casey. DUK:[id number] DOB: Dec 30, 1929   PCP: [name], MD  Patient is from: Lives with wife.  Independently ambulates at baseline.  DOA: 04/25/2021 LOS: 2  Chief complaints:  Chief Complaint  Patient presents with   Dysuria     Brief Narrative / Interim history: 88 year old M with PMH of metastatic prostate cancer s/p radical prostatectomy in 1992, chronic indwelling Foley, penile prosthesis, CAD/DES to LAD in 2017, LBBB, CKD-3A, HTN, HLD and cognitive impairment brought to ED due to confusion, generalized weakness, poor p.o. intake and fever to 102 F, and admitted for catheter associated urinary tract infection.  UA concerning for UTI.  He had leukocytosis to 18.  CT abdomen and pelvis with contrast showed scrotal and penile edema concerning for cellulitis, and known stable pancreatic head and right renal mass as well as bulky retroperitoneal and inguinal LAD.  Patient was started on IV ceftriaxone and IV fluid and admitted.   Upon further review, patient meets criteria for severe sepsis with leukocytosis, fever, AKI and catheter associated UTI.  Lactic acid negative.  The next day, urology consulted.  Foley catheter exchanged.   Subjective: Seen and examined earlier this morning.  He had fever to 100.7 F about 8 PM last night.  Hemodynamically stable.  No complaints.  He denies pain, shortness of breath, nausea, vomiting or abdominal pain.  Objective: Vitals:   04/26/21 1140 04/26/21 1240 04/26/21 1951 04/27/21 0518  BP: 133/68 123/65 (!) 148/72 (!) 113/53  Pulse: 72 71 84 72  Resp: '20 20 20 16  '$ Temp: 97.9 F (36.6 C) 98.5 F (36.9 C) (!) 100.7 F (38.2 C) 99 F (37.2 C)  TempSrc: Oral Oral Oral Oral  SpO2: 95% 97% 98% 99%  Weight:      Height:        Examination:   GENERAL: No apparent distress.  Nontoxic. HEENT: MMM.  Vision and hearing grossly intact.  NECK: Supple.  No apparent JVD.  RESP:  No IWOB.  Fair aeration  bilaterally. CVS:  RRR. Heart sounds normal.  ABD/GI/GU: BS+. Abd soft.  Mild RLQ tenderness.  No rebound or guarding.  Indwelling Foley.  Mild swelling and tenderness of the penile shaft.  MSK/EXT:  Moves extremities. No apparent deformity. No edema.  SKIN: no apparent skin lesion or wound NEURO: Awake and alert. Oriented appropriately.  No apparent focal neuro deficit. PSYCH: Calm. Normal affect.   Procedures:  None  Microbiology summarized: COVID-19 PCR positive-but he initially tested positive on 04/15/2021. Blood cultures NGTD Urine culture pending.  Assessment and Plan: * Severe sepsis ([place]) Meets criteria with fever, leukocytosis, AKI and catheter associated E. coli UTI and patient with chronic indwelling Foley.  No lactic acidosis.   Blood cultures NGTD.  Urine culture with E. Coli. No history of ESBL or MDR other than positive MRSA screen as far as I can tell.  Foley changed on 3/3.  Leukocytosis improved. -Continue IV ceftriaxone pending culture sensitivity -Follow urine culture -Urology following.  UTI (urinary tract infection) due to urinary indwelling Foley catheter ([place]) See severe sepsis  Hyponatremia Likely due to hypovolemia, lisinopril and HCTZ.  Cannot exclude SIADH.  Stable. -Continue holding lisinopril and HCTZ. -Recheck in the morning.  AKI on CKD-3A Recent Labs    05/01/20 0350 05/02/20 0334 05/03/20 0353 03/11/21 1908 03/12/21 0306 03/13/21 0251 04/15/21 1433 04/25/21 1547 04/26/21 0552 04/27/21 0539  BUN 21 24* 22 24* 22 19 30* 26* 20 15  CREATININE 1.12 1.17 1.10  1.28* 1.15 1.22 1.21 1.52* 1.21 0.92  AKI likely due to hypovolemia, dehydration and severe sepsis.  Also on lisinopril/HCTZ -Continue holding lisinopril/HCTZ.  May be discontinued definitely -Monitor off IV fluid -Recheck renal function in the morning.  Prostate cancer metastatic to multiple sites [facility]) S/p  radical prostatectomy in 1992.  Recent imaging showed bulky  retroperitoneal adenopathy consistent with metastatic prostate cancer.  Repeat imaging similar to prior.  Follows with urology, Dr. Tresa Endo, with [facility].  Scheduled to see oncology, Dr. Alen Blew, on 05/01/2021.  Patient with chronic Foley. -Outpatient follow-up with urology and oncology. -Foley exchanged by urology  CAD-S/P PCI/DES Stable, denies any chest pain.   -Continue aspirin 81 mg daily.   -Not currently on statin.  Moderate late onset Alzheimer's dementia ([place]) Has been having worsening short-term memory impairment.  Following with neurology. -Continue Namenda -Delirium precautions  Normocytic anemia Recent Labs    05/01/20 0350 05/02/20 0334 05/03/20 0353 03/11/21 1908 03/12/21 0306 03/13/21 0251 04/15/21 1433 04/25/21 1547 04/26/21 0552 04/27/21 0539  HGB 10.7* 10.4* 9.7* 13.3 11.3* 10.9* 12.7* 9.2* 9.2* 8.6*  Slight drop in Hgb but no signs of bleeding.  Probably dilutional drop. -Continue monitoring -Check anemia panel in the morning   Lab test positive for detection of COVID-19 virus Tested positive on 04/15/2021.  No hypoxemia, respiratory distress or radiologic evidence of pneumonia.  He was discharged on Paxlovid. -Discontinue isolation precaution.  Bandemia Likely from infection and dehydration.  Improved. -Continue monitoring  Essential hypertension Normotensive off antihypertensive medication.  Does not look like he was taking amlodipine or Coreg -Continue holding lisinopril and HCTZ in the setting of hyponatremia and AKI.  Pancreatic mass Stable 1.3 cm hypodense mass at the pancreatic head again noted on CT imaging. -Outpatient follow-up with oncology  Renal mass, right Lateral right kidney enhancing exophytic mass again seen on CT imaging concerning for renal cell carcinoma. -Outpatient follow-up with urology and oncology         Body mass index is 20.98 kg/m.         DVT prophylaxis:  heparin injection 5,000 Units Start:  04/26/21 0600  Code Status: DNR/DNI Family Communication: Updated patient's son over the phone on 3/3. Level of care: Med-Surg Status is: Inpatient Remains inpatient appropriate because: Severe sepsis due to catheter associated urinary tract infection, hyponatremia   Final disposition: TBD after PT/OT eval.    Consultants:  Urology   Sch Meds:  Scheduled Meds:  aspirin EC  81 mg Oral Daily   Chlorhexidine Gluconate Cloth  6 each Topical Daily   Chlorhexidine Gluconate Cloth  6 each Topical Daily   heparin  5,000 Units Subcutaneous Q8H   melatonin  3 mg Oral QHS   memantine  10 mg Oral BID   Continuous Infusions:  cefTRIAXone (ROCEPHIN)  IV Stopped (04/26/21 1751)   PRN Meds:.acetaminophen **OR** acetaminophen, ondansetron **OR** ondansetron (ZOFRAN) IV, senna-docusate  Antimicrobials: Anti-infectives (From admission, onward)    Start     Dose/Rate Route Frequency Ordered Stop   04/25/21 1815  cefTRIAXone (ROCEPHIN) 2 g in sodium chloride 0.9 % 100 mL IVPB        2 g 200 mL/hr over 30 Minutes Intravenous Every 24 hours 04/25/21 1811 05/02/21 1759        I have personally reviewed the following labs and images: CBC: Recent Labs  Lab 04/25/21 1547 04/26/21 0552 04/27/21 0539  WBC 18.3* 15.5* 11.7*  NEUTROABS 15.4*  --   --   HGB 9.2* 9.2*  8.6*  HCT 29.4* 29.0* 27.6*  MCV 89.1 90.9 91.4  PLT 493* 421* 440*   BMP &GFR Recent Labs  Lab 04/25/21 1547 04/26/21 0552 04/27/21 0539  NA 129* 130* 130*  K 3.9 3.6 4.2  CL 94* 97* 102  CO2 24 23 21*  GLUCOSE 136* 116* 104*  BUN 26* 20 15  CREATININE 1.52* 1.21 0.92  CALCIUM 9.0 8.1* 7.7*  MG  --   --  2.2  PHOS  --   --  2.7   Estimated Creatinine Clearance: 48.2 mL/min (by C-G formula based on SCr of 0.92 mg/dL). Liver & Pancreas: Recent Labs  Lab 04/25/21 1547 04/27/21 0539  AST 21  --   ALT 16  --   ALKPHOS 91  --   BILITOT 0.5  --   PROT 6.5  --   ALBUMIN 3.2* 2.1*   No results for input(s):  LIPASE, AMYLASE in the last 168 hours. No results for input(s): AMMONIA in the last 168 hours. Diabetic: No results for input(s): HGBA1C in the last 72 hours. No results for input(s): GLUCAP in the last 168 hours. Cardiac Enzymes: No results for input(s): CKTOTAL, CKMB, CKMBINDEX, TROPONINI in the last 168 hours. No results for input(s): PROBNP in the last 8760 hours. Coagulation Profile: Recent Labs  Lab 04/25/21 1547  INR 1.2   Thyroid Function Tests: No results for input(s): TSH, T4TOTAL, FREET4, T3FREE, THYROIDAB in the last 72 hours. Lipid Profile: No results for input(s): CHOL, HDL, LDLCALC, TRIG, CHOLHDL, LDLDIRECT in the last 72 hours. Anemia Panel: No results for input(s): VITAMINB12, FOLATE, FERRITIN, TIBC, IRON, RETICCTPCT in the last 72 hours. Urine analysis:    Component Value Date/Time   COLORURINE YELLOW 04/25/2021 1547   APPEARANCEUR HAZY (A) 04/25/2021 1547   LABSPEC 1.029 04/25/2021 1547   PHURINE 6.5 04/25/2021 1547   GLUCOSEU NEGATIVE 04/25/2021 1547   HGBUR MODERATE (A) 04/25/2021 1547   BILIRUBINUR NEGATIVE 04/25/2021 1547   KETONESUR NEGATIVE 04/25/2021 1547   PROTEINUR 30 (A) 04/25/2021 1547   UROBILINOGEN 0.2 05/15/2007 1118   NITRITE POSITIVE (A) 04/25/2021 1547   LEUKOCYTESUR LARGE (A) 04/25/2021 1547   Sepsis Labs: Invalid input(s): PROCALCITONIN, [place]  Microbiology: Recent Results (from the past 240 hour(s))  Blood Culture (routine x 2)     Status: None (Preliminary result)   Collection Time: 04/25/21  3:47 PM   Specimen: Right Antecubital; Blood  Result Value Ref Range Status   Specimen Description   Final    RIGHT ANTECUBITAL Performed at [facility], [address] [place], [place] [address]    Special Requests   Final    BOTTLES DRAWN AEROBIC AND ANAEROBIC Blood Culture adequate volume Performed at [facility], [address] [place], [place] [address]    Culture   Final    NO  GROWTH 2 DAYS Performed at [facility], [place] [address] [place], [place] [address]    Report Status PENDING  Incomplete  Urine Culture     Status: Abnormal (Preliminary result)   Collection Time: 04/25/21  3:47 PM   Specimen: In/Out Cath Urine  Result Value Ref Range Status   Specimen Description   Final    IN/OUT CATH URINE Performed at [facility], [address] [place], [place] [address]    Special Requests   Final    Normal Performed at [facility], [address] [place], [place] [address]    Culture (A)  Final    >=  100,000 COLONIES/mL KLEBSIELLA PNEUMONIAE 80,000 COLONIES/mL STAPHYLOCOCCUS AUREUS    Report Status PENDING  Incomplete  Resp Panel by RT-PCR (Flu A&B, Covid) Nasopharyngeal Swab     Status: Abnormal   Collection Time: 04/25/21  3:47 PM   Specimen: Nasopharyngeal Swab; Nasopharyngeal(NP) swabs in vial transport medium  Result Value Ref Range Status   SARS Coronavirus 2 by RT PCR POSITIVE (A) NEGATIVE Final    Comment: (NOTE) SARS-CoV-2 target nucleic acids are DETECTED.  The SARS-CoV-2 RNA is generally detectable in upper respiratory specimens during the acute phase of infection. Positive results are indicative of the presence of the identified virus, but do not rule out bacterial infection or co-infection with other pathogens not detected by the test. Clinical correlation with patient history and other diagnostic information is necessary to determine patient infection status. The expected result is Negative.  Fact Sheet for Patients: EntrepreneurPulse.com.au  Fact Sheet for Healthcare Providers: IncredibleEmployment.be  This test is not yet approved or cleared by the [place] FDA and  has been authorized for detection and/or diagnosis of SARS-CoV-2 by FDA under an Emergency Use Authorization (EUA).  This EUA will remain in effect (meaning this test can be used)  for the duration of  the COVID-19 declaration under Section 564(b)(1) of the A ct, 21 U.S.C. section 360bbb-3(b)(1), unless the authorization is terminated or revoked sooner.     Influenza A by PCR NEGATIVE NEGATIVE Final   Influenza B by PCR NEGATIVE NEGATIVE Final    Comment: (NOTE) The Xpert Xpress SARS-CoV-2/FLU/RSV plus assay is intended as an aid in the diagnosis of influenza from Nasopharyngeal swab specimens and should not be used as a sole basis for treatment. Nasal washings and aspirates are unacceptable for Xpert Xpress SARS-CoV-2/FLU/RSV testing.  Fact Sheet for Patients: EntrepreneurPulse.com.au  Fact Sheet for Healthcare Providers: IncredibleEmployment.be  This test is not yet approved or cleared by the [place] FDA and has been authorized for detection and/or diagnosis of SARS-CoV-2 by FDA under an Emergency Use Authorization (EUA). This EUA will remain in effect (meaning this test can be used) for the duration of the COVID-19 declaration under Section 564(b)(1) of the Act, 21 U.S.C. section 360bbb-3(b)(1), unless the authorization is terminated or revoked.  Performed at [facility], [address], [place], [place] [address]   Blood Culture (routine x 2)     Status: None (Preliminary result)   Collection Time: 04/25/21  7:08 PM   Specimen: BLOOD  Result Value Ref Range Status   Specimen Description   Final    BLOOD LEFT ANTECUBITAL Performed at [facility], [address] [place], [place] [address]    Special Requests   Final    BOTTLES DRAWN AEROBIC AND ANAEROBIC Blood Culture adequate volume Performed at [facility], [address] [place], [place] [address]    Culture   Final    NO GROWTH 2 DAYS Performed at [facility], [place] [address], [place], [place] [address]    Report Status PENDING  Incomplete    Radiology Studies: No results  found.    Chad Tiznado T. [place]  If 7PM-7AM, please contact night-coverage [email or web address] 04/27/2021, 12:19 PM

## 2021-04-27 NOTE — Plan of Care (Signed)

## 2021-04-28 DIAGNOSIS — R5381 Other malaise: Secondary | ICD-10-CM

## 2021-04-28 LAB — URINE CULTURE
Culture: >=100000 — AB
Special Requests: NORMAL

## 2021-04-28 LAB — RENAL FUNCTION PANEL
Albumin: 2 g/dL — ABNORMAL LOW (ref 3.5–5.0)
Anion gap: 6 (ref 5–15)
BUN: 12 mg/dL (ref 8–23)
CO2: 22 mmol/L (ref 22–32)
Calcium: 7.9 mg/dL — ABNORMAL LOW (ref 8.9–10.3)
Chloride: 102 mmol/L (ref 98–111)
Creatinine, Ser: 0.89 mg/dL (ref 0.61–1.24)
GFR, Estimated: >60 mL/min (ref >60)
Glucose, Bld: 110 mg/dL — ABNORMAL HIGH (ref 70–99)
Phosphorus: 2.6 mg/dL (ref 2.5–4.6)
Potassium: 4.1 mmol/L (ref 3.5–5.1)
Sodium: 130 mmol/L — ABNORMAL LOW (ref 135–145)

## 2021-04-28 LAB — CBC
HCT: 28.8 % — ABNORMAL LOW (ref 39.0–52.0)
Hemoglobin: 9 g/dL — ABNORMAL LOW (ref 13.0–17.0)
MCH: 28.7 pg (ref 26.0–34.0)
MCHC: 31.3 g/dL (ref 30.0–36.0)
MCV: 91.7 fL (ref 80.0–100.0)
Platelets: 504 10*3/uL — ABNORMAL HIGH (ref 150–400)
RBC: 3.14 MIL/uL — ABNORMAL LOW (ref 4.22–5.81)
RDW: 13.9 % (ref 11.5–15.5)
WBC: 9.9 10*3/uL (ref 4.0–10.5)
nRBC: 0 % (ref 0.0–0.2)

## 2021-04-28 LAB — MAGNESIUM: Magnesium: 2.2 mg/dL (ref 1.7–2.4)

## 2021-04-28 MED ORDER — CEFADROXIL 500 MG PO CAPS: 500.0000 mg | ORAL_CAPSULE | Freq: Two times a day (BID) | ORAL | 0 refills | Status: AC

## 2021-04-28 MED ORDER — SENNOSIDES-DOCUSATE SODIUM 8.6-50 MG PO TABS: 1.0000 | ORAL_TABLET | Freq: Every evening | ORAL | Status: DC | PRN

## 2021-04-28 NOTE — Plan of Care (Signed)

## 2021-04-28 NOTE — Assessment & Plan Note (Signed)
Home health PT and OT ordered as recommended ?

## 2021-04-28 NOTE — Progress Notes (Signed)
Urology Progress Note  ? ?88 yo M w/ Hx of PCa s/p radical prostatectomy + salvage radiation, ED s/p IPP placement who is admitted for AKI, UTI. He is COVID positive. ? ?Subjective: ?- NAEON. ?- Tmax 101.17F '@1330'$ , otherwise AF ?- Pain well controlled ?- GU exam stable ?- Ucx w/ Klebsiella ? ?Objective: ?Vital signs in last 24 hours: ?Temp:  [98.2 ?F (36.8 ?C)-101.4 ?F (38.6 ?C)] 98.5 ?F (36.9 ?C) (03/05 0253) ?Pulse Rate:  [70-75] 73 (03/05 0253) ?Resp:  [14-18] 14 (03/05 0253) ?BP: (143-158)/(72-76) 143/72 (03/05 0253) ?SpO2:  [98 %-100 %] 98 % (03/05 0253) ? ?Intake/Output from previous day: ?03/04 0701 - 03/05 0700 ?In: 240 [P.O.:240] ?Out: 1600 [Urine:1600] ?Intake/Output this shift: ?No intake/output data recorded. ? ?Physical Exam:  ?General: Alert and oriented ?CV: Regular rate ?Lungs: No increased work of breathing ?Abdomen: Soft, non-tender, non-distended. ?GU: Foley in place draining clear yellow urine. Moderate swelling of the distal penile shaft. Mild swelling of scrotum. Scrotal pump mobile. Moderate tenderness of penile shaft and scrotum. No obvious device erosion. Overall exam stable. ?Ext: NT, No erythema ? ?Lab Results: ?Recent Labs  ?  04/26/21 ?1308 04/27/21 ?6578 04/28/21 ?0507  ?HGB 9.2* 8.6* 9.0*  ?HCT 29.0* 27.6* 28.8*  ? ? ?Recent Labs  ?  04/27/21 ?4696 04/28/21 ?0507  ?NA 130* 130*  ?K 4.2 4.1  ?CL 102 102  ?CO2 21* 22  ?GLUCOSE 104* 110*  ?BUN 15 12  ?CREATININE 0.92 0.89  ?CALCIUM 7.7* 7.9*  ? ? ? ?Studies/Results: ?No results found. ? ?Assessment/Plan: ? ?88 y.o. male w/ Hx of PCa s/p radical prostatectomy + salvage radiation, ED s/p IPP placement who is admitted for AKI, UTI. He is COVID positive.  Seems to be improving with antibiotic therapy (decreasing leukocytosis). GU exam is overall stable. Will continue to monitor.  ? - Continue Foley catheter ?- Continue antibiotics for UTI. Narrow based of culture sensitivities.  ?- As patient's exam is stable and his leukocytosis continues to  improve, we believe he is safe to discharge on oral antibiotics  ?- He will follow up with Dr. Tresa Endo at [facility] for further care and evaluation ? ? ? LOS: 3 days  ? ?Reola Mosher, MD ?[facility] Urology Resident, PGY4 ?[facility] ? ?

## 2021-04-28 NOTE — Progress Notes (Signed)
[name] wife is dying per pt and son, Vicente Serene.She is at [facility]. Pt states he needs to get out today so he can go see her. Son states he is in [facility] and he would try to make arrangements for him to be picked up by caregiver if possible. I let him know I would relay the information to the team today and hopefully we could provide a solution. Please contact son with any updates. [phone] ?

## 2021-04-28 NOTE — Discharge Summary (Signed)
Physician Discharge Summary  Andre Casey. PXT:[id number] DOB: Jan 31, 1930 DOA: 04/25/2021  PCP: [name], MD  Admit date: 04/25/2021 Discharge date: 04/28/2021 Admitted From: Home Disposition: Home Recommendations for Outpatient Follow-up:  Follow ups as below. Please obtain CBC/BMP/Mag at follow up Please follow up on the following pending results: None  Home Health: PT/OT Equipment/Devices: None indicated  Discharge Condition: Stable CODE STATUS: DNR/DNI  Follow-up Information     [name], MD. Schedule an appointment as soon as possible for a visit in 1 week(s).   Specialty: Family Medicine Contact information: [address]  [place] [address] [phone]         [name], MD Follow up in 1 week(s).   Specialty: Urology Contact information: [place] [address] [phone]                 88 year old M with PMH of metastatic prostate cancer s/p radical prostatectomy in 1992, chronic indwelling Foley, penile prosthesis, CAD/DES to LAD in 2017, LBBB, CKD-3A, HTN, HLD and cognitive impairment brought to ED due to confusion, generalized weakness, poor p.o. intake and fever to 102 F, and admitted for catheter associated urinary tract infection.  UA concerning for UTI.  He had leukocytosis to 18.  CT abdomen and pelvis with contrast showed scrotal and penile edema concerning for cellulitis, and known stable pancreatic head and right renal mass as well as bulky retroperitoneal and inguinal LAD.  Patient was started on IV ceftriaxone and IV fluid and admitted.   Upon further review, patient meets criteria for severe sepsis with leukocytosis, fever, AKI and catheter associated UTI.  Lactic acid negative. The next day, urology consulted.  Foley catheter exchanged.  Patient's blood cultures remain negative.  Urine culture with Klebsiella pneumonia and Staph aureus.  He received ceftriaxone for 3 days and  discharged on p.o. cefadroxil 500 mg twice daily for 7 more days.  He is cleared for discharge by urology for outpatient follow-up with his urologist.   See individual problem list below for more on hospital course.  Discharge Diagnoses:  Assessment and Plan: * Severe sepsis ([place]) 7Meets criteria with fever, leukocytosis, AKI and catheter associated E. coli UTI and patient with chronic indwelling Foley.  No lactic acidosis.   Blood cultures NGTD.  Urine culture with Klebsiella pneumonia and Staph aureus.  Foley changed on 3/3.  Sepsis physiology resolved. -Received IV ceftriaxone for 3 days in-house -Discharged on p.o. cefadroxil 500 mg twice daily for 7 more days -Outpatient follow-up with his urologist  UTI (urinary tract infection) due to urinary indwelling Foley catheter ([place]) See severe sepsis  Hyponatremia Likely due to hypovolemia, lisinopril and HCTZ.  Cannot exclude SIADH.  Stable. -Lisinopril and HCTZ discontinued on discharge -Recheck sodium level at follow-up  AKI on CKD-3A Recent Labs    05/02/20 0334 05/03/20 0353 03/11/21 1908 03/12/21 0306 03/13/21 0251 04/15/21 1433 04/25/21 1547 04/26/21 0552 04/27/21 0539 04/28/21 0507  BUN 24* 22 24* 22 19 30* 26* '20 15 12  '$ CREATININE 1.17 1.10 1.28* 1.15 1.22 1.21 1.52* 1.21 0.92 0.89  AKI likely due to hypovolemia, dehydration and severe sepsis.  Also on lisinopril/HCTZ -Lisinopril, HCTZ and amlodipine discontinued -Recheck renal function at follow-up  Prostate cancer metastatic to multiple sites [facility]) S/p  radical prostatectomy in 1992.  Recent imaging showed bulky retroperitoneal adenopathy consistent with metastatic prostate cancer.  Repeat imaging similar to prior.  Follows with urology, Dr. Tresa Endo, with [facility].  Scheduled to see oncology, Dr. Alen Blew, on 05/01/2021.  Patient with chronic Foley. -Outpatient follow-up with urology and oncology. -Foley exchanged by urology  CAD-S/P PCI/DES Stable, denies  any chest pain.   -Continue home Coreg and aspirin.  Moderate late onset Alzheimer's dementia ([place]) Has been having worsening short-term memory impairment.  Following with neurology. -Continue Namenda -Delirium precautions  Normocytic anemia Recent Labs    05/01/20 0350 05/02/20 0334 05/03/20 0353 03/11/21 1908 03/12/21 0306 03/13/21 0251 04/15/21 1433 04/25/21 1547 04/26/21 0552 04/27/21 0539  HGB 10.7* 10.4* 9.7* 13.3 11.3* 10.9* 12.7* 9.2* 9.2* 8.6*  Slight drop in Hgb but no signs of bleeding.  Probably dilutional drop. -Continue monitoring -Check anemia panel in the morning   Lab test positive for detection of COVID-19 virus Tested positive on 04/15/2021.  No hypoxemia, respiratory distress or radiologic evidence of pneumonia.  He was discharged on Paxlovid. -Discontinue isolation precaution.  Physical deconditioning Home health PT and OT ordered as recommended  Bandemia Likely from infection and dehydration.  Resolved.  Essential hypertension Normotensive off antihypertensive medication.  Does not look like he was taking amlodipine or Coreg -Lisinopril, HCTZ and amlodipine discontinued. -Continue home Coreg given history of CAD  Pancreatic mass Stable 1.3 cm hypodense mass at the pancreatic head again noted on CT imaging. -Outpatient follow-up with oncology  Renal mass, right Lateral right kidney enhancing exophytic mass again seen on CT imaging concerning for renal cell carcinoma. -Outpatient follow-up with urology and oncology       Body mass index is 20.98 kg/m.           Discharge Exam: Vitals:   04/27/21 1338 04/27/21 1557 04/27/21 2049 04/28/21 0253  BP: (!) 158/73  (!) 143/76 (!) 143/72  Pulse: 75  70 73  Temp: (!) 101.4 F (38.6 C) 98.2 F (36.8 C) 98.2 F (36.8 C) 98.5 F (36.9 C)  Resp: '18  16 14  '$ Height:      Weight:      SpO2: 98%  100% 98%  TempSrc: Oral Oral Oral Oral  BMI (Calculated):         GENERAL: Frail looking.   Nontoxic. HEENT: MMM.  Vision and hearing grossly intact.  NECK: Supple.  No apparent JVD.  RESP:  No IWOB.  Fair aeration bilaterally. CVS:  RRR. Heart sounds normal.  ABD/GI/GU: BS +.  Soft. Non tender.  Slight penile and scrotal edema.  Foley in place.  Clear urine. MSK/EXT:  Moves extremities. No apparent deformity. No edema.  SKIN: no apparent skin lesion or wound NEURO: Awake and alert.  Oriented appropriately.  No apparent focal neuro deficit. PSYCH: Calm. Normal affect.   Discharge Instructions  Discharge Instructions     Call MD for:  extreme fatigue   Complete by: As directed    Call MD for:  persistant dizziness or light-headedness   Complete by: As directed    Call MD for:  persistant nausea and vomiting   Complete by: As directed    Call MD for:  severe uncontrolled pain   Complete by: As directed    Call MD for:  temperature >100.4   Complete by: As directed    Diet - low sodium heart healthy   Complete by: As directed    Discharge instructions   Complete by: As directed    It has been a pleasure taking care of you!  You were hospitalized due to urinary tract infection for which you have been treated with antibiotic.  We are  discharging you more antibiotics to complete treatment course.  Please review your new medication list and the directions on your medications before you take them.  Follow-up with your primary care doctor and neurologist in 1 to 2 weeks or sooner if needed.  We are sorry about your wife!   Take care,   Increase activity slowly   Complete by: As directed       Allergies as of 04/28/2021       Reactions   Donepezil Nausea Only   Report upset stomach - unable to tolerate.   Namzaric [memantine Hcl-donepezil Hcl] Other (See Comments)   Stomach upset - pt would like to try lower doses of medications in separate prescriptions (note in Epic). 03/12/21 - pt is currently taking memantine        Medication List     STOP taking these  medications    amLODipine 5 MG tablet Commonly known as: NORVASC   lisinopril-hydrochlorothiazide 20-12.5 MG tablet Commonly known as: ZESTORETIC   POTASSIUM PO       TAKE these medications    acetaminophen 500 MG tablet Commonly known as: TYLENOL Take 1,000 mg by mouth every 8 (eight) hours as needed for mild pain or headache.   aspirin EC 81 MG tablet Take 1 tablet (81 mg total) by mouth daily. Swallow whole.   carvedilol 6.25 MG tablet Commonly known as: COREG Take 1 tablet (6.25 mg total) by mouth 2 (two) times daily with a meal.   cefadroxil 500 MG capsule Commonly known as: DURICEF Take 1 capsule (500 mg total) by mouth 2 (two) times daily for 7 days.   MAGNESIUM PO Take 1 tablet by mouth See admin instructions. Take one tablet by mouth every Monday, Wednesday, Friday night   Melatonin 3 MG Caps Take 1 capsule by mouth at bedtime.   memantine 10 MG tablet Commonly known as: Namenda Take 1 tablet (10 mg total) by mouth 2 (two) times daily.   PreserVision AREDS 2 Caps Take 1 capsule by mouth 2 (two) times daily. What changed: Another medication with the same name was removed. Continue taking this medication, and follow the directions you see here.   PROBIOTIC PO Take 1 tablet by mouth every morning.   senna-docusate 8.6-50 MG tablet Commonly known as: Senokot-S Take 1 tablet by mouth at bedtime as needed for mild constipation.   traMADol 50 MG tablet Commonly known as: ULTRAM Take 1 tablet (50 mg total) by mouth every 6 (six) hours as needed.   VITAMIN C PO Take 1 tablet by mouth every morning.        Consultations: Urology  Procedures/Studies: Indwelling Foley exchange   DG Chest 2 View  Result Date: 04/15/2021 CLINICAL DATA:  Cough.  COVID positive. EXAM: CHEST - 2 VIEW COMPARISON:  04/30/2020 FINDINGS: The cardiomediastinal silhouette is unchanged with normal heart size. No airspace consolidation, edema, pleural effusion, or pneumothorax  is identified. There is a calcified granuloma in the left lung apex. Prior right shoulder arthroplasty is noted. There is mild thoracic levoscoliosis. IMPRESSION: No active cardiopulmonary disease. Electronically Signed   By: Logan Bores M.D.   On: 04/15/2021 15:29   CT Soft Tissue Neck W Contrast  Result Date: 04/15/2021 CLINICAL DATA:  Neck mass, nonpulsatile, COVID positive EXAM: CT NECK WITH CONTRAST TECHNIQUE: Multidetector CT imaging of the neck was performed using the standard protocol following the bolus administration of intravenous contrast. RADIATION DOSE REDUCTION: This exam was performed according to the departmental dose-optimization program which  includes automated exposure control, adjustment of the mA and/or kV according to patient size and/or use of iterative reconstruction technique. CONTRAST:  9m OMNIPAQUE IOHEXOL 300 MG/ML  SOLN COMPARISON:  None. FINDINGS: Evaluation is somewhat limited by motion, which is most notable at the level of the hyoid. Pharynx and larynx: Evaluation is limited by motion. Within this limitation, the larynx and pharynx appear normal, without mass or swelling. Salivary glands: No inflammation, mass, or stone. Thyroid: Normal. Lymph nodes: None enlarged or abnormal density. Vascular: No acute vascular abnormality.  Aortic atherosclerosis. Limited intracranial: Negative. Visualized orbits: Status post bilateral lens replacements. Otherwise unremarkable. Mastoids and visualized paranasal sinuses: Clear. Skeleton: Severe degenerative changes in the cervical spine, with reversal of the normal cervical lordosis, 4 mm anterolisthesis of C4 on C5, 3 mm retrolisthesis of C5 on C6, 3 mm retrolisthesis of C6 on C7, and 4 mm anterolisthesis of C7 on T1. Degenerative changes at the atlantodental interval. Basilar impression. Upper chest: Evaluation is somewhat limited by motion. No focal pulmonary opacity or pleural effusion. Centrilobular and paraseptal emphysema. Other:  None. IMPRESSION: 1. No acute process in the neck. 2. Severe degenerative changes in the cervical spine, as described above. 3. Aortic Atherosclerosis (ICD10-I70.0) and Emphysema (ICD10-J43.9). Electronically Signed   By: AMerilyn BabaM.D.   On: 04/15/2021 20:40   CT ABDOMEN PELVIS W CONTRAST  Result Date: 04/25/2021 CLINICAL DATA:  Sepsis. Redness swelling and pain in penis and mons pubis. Intermittent fever and UTI symptoms for 1 week. Foley catheter in place. Testicular swelling and penile discomfort. History of prostate cancer, penile prosthesis implant, and hernia repair. EXAM: CT ABDOMEN AND PELVIS WITH CONTRAST TECHNIQUE: Multidetector CT imaging of the abdomen and pelvis was performed using the standard protocol following bolus administration of intravenous contrast. RADIATION DOSE REDUCTION: This exam was performed according to the departmental dose-optimization program which includes automated exposure control, adjustment of the mA and/or kV according to patient size and/or use of iterative reconstruction technique. CONTRAST:  716mOMNIPAQUE IOHEXOL 300 MG/ML  SOLN COMPARISON:  Whole-body nuclear bone scan 03/13/2021; CT abdomen and pelvis 03/12/2021; MRI abdomen 02/15/2019 FINDINGS: Lower chest: Emphysematous changes are again seen. Mild bibasilar posterior dependent subpleural curvilinear subsegmental atelectasis versus scarring. Hepatobiliary: Smooth liver contours. No focal liver mass is identified. The gallbladder is unremarkable. No intrahepatic or extrahepatic biliary ductal dilatation. Pancreas: A hypodense 1.3 cm mass is again unchanged within the pancreatic head also unchanged from MRI abdomen 02/15/2019. Within the pancreatic head, there is again unchanged mild ductal dilatation up to 4 mm (axial series 2, image 19). Spleen: Normal in size without focal abnormality. Adrenals/Urinary Tract: Adrenal glands are unremarkable. The kidneys enhance uniformly and are symmetric in size without  hydronephrosis. No renal stone is seen. No significant change in 2.5 x 1.7 x 2.4 cm (transverse by craniocaudal) soft tissue enhancing mass within the lateral midpole of the right kidney, again partially exophytic. This is again highly suspicious for renal cell carcinoma. Several small hypodense renal likely cortical cysts are again seen bilaterally. Urinary bladder is decompressed by Foley catheter limiting evaluation. Stomach/Bowel: High-grade sigmoid and mild descending diverticulosis without definite inflammatory change to indicate acute diverticulitis. High-grade stool throughout the colon compatible with constipation. The terminal ileum is unremarkable. The appendix is retrocecal and appears within normal limits. No dilated loops of bowel to indicate bowel obstruction. Vascular/Lymphatic: No abdominal aortic aneurysm. Mild-to-moderate atherosclerotic calcifications. Retroperitoneal 2.8 by 1.8 cm enlarged lymph node posterior to the aorta (axial series 2, image 27) is  unchanged. Distal left external iliac chain 3.2 by 2.1 cm lymph node is unchanged when measured in a similar manner. Multiple enlarged left inguinal lymph nodes are again seen measuring up to 2.2 by 1.3 cm (axial image 59), unchanged. Reproductive: Postsurgical changes are again seen prostatectomy with numerous pelvic surgical clips from bilateral pelvic lymph node dissection. Penile implant is seen with1 reservoir within the anterior lower abdomen/pelvis. There is left-greater-than-right penile soft tissue swelling with likely subcutaneous dense fluid (axial series 2, image 75). Previously less of the penis was imaged and is unclear whether the current swelling was partially included on the prior CT. Other: No abdominal wall hernia or abnormality.No free air or free fluid is seen within the abdomen or pelvis. No intra-abdominal or pelvic rim enhancing abscess is seen. Musculoskeletal: Mild dextrocurvature centered at L1 and mild levocurvature  centered at L3. Severe multilevel degenerative disc changes. Mild retrolisthesis of L1 on L2 and L2 on L3 and grade 1 anterolisthesis of L5 on S1. IMPRESSION:: IMPRESSION: 1. There is penile and scrotal soft tissue swelling and edema. No abscess or free fluid is seen within the pelvis. 2. Otherwise, no significant change from 03/12/2021 CT. 3. Stable pancreatic head 1.3 cm hypodense mass. This demonstrates stability on multiple prior CTs and MRI 02/15/2019. 4. Lateral right kidney enhancing exophytic mass again concerning for renal cell carcinoma. 5. Stable bulky retroperitoneal lymphadenopathy and left inguinal adenopathy concerning for metastatic disease. 6. Postsurgical changes of prostatectomy and pelvic lymph node dissection. Electronically Signed   By: Yvonne Kendall M.D.   On: 04/25/2021 17:30   DG Chest Port 1 View  Result Date: 04/25/2021 CLINICAL DATA:  UTI symptoms, intermittent fever EXAM: PORTABLE CHEST 1 VIEW COMPARISON:  Chest radiograph 04/15/2021 FINDINGS: The cardiomediastinal silhouette is stable. There is no focal consolidation or pulmonary edema. There is no pleural effusion or pneumothorax There is no acute osseous abnormality. Reverse shoulder arthroplasty is again noted on the right. IMPRESSION: Stable chest with no radiographic evidence of acute cardiopulmonary process. Electronically Signed   By: Valetta Mole M.D.   On: 04/25/2021 15:45       The results of significant diagnostics from this hospitalization (including imaging, microbiology, ancillary and laboratory) are listed below for reference.     Microbiology: Recent Results (from the past 240 hour(s))  Blood Culture (routine x 2)     Status: None (Preliminary result)   Collection Time: 04/25/21  3:47 PM   Specimen: Right Antecubital; Blood  Result Value Ref Range Status   Specimen Description   Final    RIGHT ANTECUBITAL Performed at [facility], [address] [place], [place] [address]     Special Requests   Final    BOTTLES DRAWN AEROBIC AND ANAEROBIC Blood Culture adequate volume Performed at [facility], [address], [place], [place] [address]    Culture   Final    NO GROWTH 3 DAYS Performed at [facility], [place] [address], [place], [place] [address]    Report Status PENDING  Incomplete  Urine Culture     Status: Abnormal   Collection Time: 04/25/21  3:47 PM   Specimen: In/Out Cath Urine  Result Value Ref Range Status   Specimen Description   Final    IN/OUT CATH URINE Performed at [facility], [address] [place],  [address]    Special Requests   Final    Normal Performed at [facility], [address], [place],  [address]  Culture (A)  Final    >=100,000 COLONIES/mL KLEBSIELLA PNEUMONIAE 80,000 COLONIES/mL STAPHYLOCOCCUS AUREUS    Report Status 04/28/2021 FINAL  Final   Organism ID, Bacteria KLEBSIELLA PNEUMONIAE (A)  Final   Organism ID, Bacteria STAPHYLOCOCCUS AUREUS (A)  Final      Susceptibility   Klebsiella pneumoniae - MIC*    AMPICILLIN >=32 RESISTANT Resistant     CEFAZOLIN <=4 SENSITIVE Sensitive     CEFEPIME <=0.12 SENSITIVE Sensitive     CEFTRIAXONE <=0.25 SENSITIVE Sensitive     CIPROFLOXACIN <=0.25 SENSITIVE Sensitive     GENTAMICIN <=1 SENSITIVE Sensitive     IMIPENEM <=0.25 SENSITIVE Sensitive     NITROFURANTOIN 64 INTERMEDIATE Intermediate     TRIMETH/SULFA >=320 RESISTANT Resistant     AMPICILLIN/SULBACTAM 4 SENSITIVE Sensitive     PIP/TAZO <=4 SENSITIVE Sensitive     * >=100,000 COLONIES/mL KLEBSIELLA PNEUMONIAE   Staphylococcus aureus - MIC*    CIPROFLOXACIN <=0.5 SENSITIVE Sensitive     GENTAMICIN <=0.5 SENSITIVE Sensitive     NITROFURANTOIN <=16 SENSITIVE Sensitive     OXACILLIN 0.5 SENSITIVE Sensitive     TETRACYCLINE >=16 RESISTANT Resistant     VANCOMYCIN 1 SENSITIVE Sensitive     TRIMETH/SULFA <=10 SENSITIVE Sensitive      CLINDAMYCIN <=0.25 SENSITIVE Sensitive     RIFAMPIN <=0.5 SENSITIVE Sensitive     Inducible Clindamycin NEGATIVE Sensitive     * 80,000 COLONIES/mL STAPHYLOCOCCUS AUREUS  Resp Panel by RT-PCR (Flu A&B, Covid) Nasopharyngeal Swab     Status: Abnormal   Collection Time: 04/25/21  3:47 PM   Specimen: Nasopharyngeal Swab; Nasopharyngeal(NP) swabs in vial transport medium  Result Value Ref Range Status   SARS Coronavirus 2 by RT PCR POSITIVE (A) NEGATIVE Final    Comment: (NOTE) SARS-CoV-2 target nucleic acids are DETECTED.  The SARS-CoV-2 RNA is generally detectable in upper respiratory specimens during the acute phase of infection. Positive results are indicative of the presence of the identified virus, but do not rule out bacterial infection or co-infection with other pathogens not detected by the test. Clinical correlation with patient history and other diagnostic information is necessary to determine patient infection status. The expected result is Negative.  Fact Sheet for Patients: EntrepreneurPulse.com.au  Fact Sheet for Healthcare Providers: IncredibleEmployment.be  This test is not yet approved or cleared by the [place] FDA and  has been authorized for detection and/or diagnosis of SARS-CoV-2 by FDA under an Emergency Use Authorization (EUA).  This EUA will remain in effect (meaning this test can be used) for the duration of  the COVID-19 declaration under Section 564(b)(1) of the A ct, 21 U.S.C. section 360bbb-3(b)(1), unless the authorization is terminated or revoked sooner.     Influenza A by PCR NEGATIVE NEGATIVE Final   Influenza B by PCR NEGATIVE NEGATIVE Final    Comment: (NOTE) The Xpert Xpress SARS-CoV-2/FLU/RSV plus assay is intended as an aid in the diagnosis of influenza from Nasopharyngeal swab specimens and should not be used as a sole basis for treatment. Nasal washings and aspirates are unacceptable for Xpert  Xpress SARS-CoV-2/FLU/RSV testing.  Fact Sheet for Patients: EntrepreneurPulse.com.au  Fact Sheet for Healthcare Providers: IncredibleEmployment.be  This test is not yet approved or cleared by the [place] FDA and has been authorized for detection and/or diagnosis of SARS-CoV-2 by FDA under an Emergency Use Authorization (EUA). This EUA will remain in effect (meaning this test can be used) for the duration of the COVID-19 declaration under Section 564(b)(1) of the  Act, 21 U.S.C. section 360bbb-3(b)(1), unless the authorization is terminated or revoked.  Performed at [facility], [address], [place], [place] [address]   Blood Culture (routine x 2)     Status: None (Preliminary result)   Collection Time: 04/25/21  7:08 PM   Specimen: BLOOD  Result Value Ref Range Status   Specimen Description   Final    BLOOD LEFT ANTECUBITAL Performed at [facility], [address], [place], [place] [address]    Special Requests   Final    BOTTLES DRAWN AEROBIC AND ANAEROBIC Blood Culture adequate volume Performed at [facility], [address] [place], [place] [address]    Culture   Final    NO GROWTH 3 DAYS Performed at [facility], [place] [address], [place], [place] [address]    Report Status PENDING  Incomplete     Labs:  CBC: Recent Labs  Lab 04/25/21 1547 04/26/21 0552 04/27/21 0539 04/28/21 0507  WBC 18.3* 15.5* 11.7* 9.9  NEUTROABS 15.4*  --   --   --   HGB 9.2* 9.2* 8.6* 9.0*  HCT 29.4* 29.0* 27.6* 28.8*  MCV 89.1 90.9 91.4 91.7  PLT 493* 421* 440* 504*   BMP &GFR Recent Labs  Lab 04/25/21 1547 04/26/21 0552 04/27/21 0539 04/28/21 0507  NA 129* 130* 130* 130*  K 3.9 3.6 4.2 4.1  CL 94* 97* 102 102  CO2 24 23 21* 22  GLUCOSE 136* 116* 104* 110*  BUN 26* '20 15 12  '$ CREATININE 1.52* 1.21 0.92 0.89  CALCIUM 9.0 8.1* 7.7* 7.9*  MG  --   --  2.2 2.2   PHOS  --   --  2.7 2.6   Estimated Creatinine Clearance: 49.8 mL/min (by C-G formula based on SCr of 0.89 mg/dL). Liver & Pancreas: Recent Labs  Lab 04/25/21 1547 04/27/21 0539 04/28/21 0507  AST 21  --   --   ALT 16  --   --   ALKPHOS 91  --   --   BILITOT 0.5  --   --   PROT 6.5  --   --   ALBUMIN 3.2* 2.1* 2.0*   No results for input(s): LIPASE, AMYLASE in the last 168 hours. No results for input(s): AMMONIA in the last 168 hours. Diabetic: No results for input(s): HGBA1C in the last 72 hours. No results for input(s): GLUCAP in the last 168 hours. Cardiac Enzymes: No results for input(s): CKTOTAL, CKMB, CKMBINDEX, TROPONINI in the last 168 hours. No results for input(s): PROBNP in the last 8760 hours. Coagulation Profile: Recent Labs  Lab 04/25/21 1547  INR 1.2   Thyroid Function Tests: No results for input(s): TSH, T4TOTAL, FREET4, T3FREE, THYROIDAB in the last 72 hours. Lipid Profile: No results for input(s): CHOL, HDL, LDLCALC, TRIG, CHOLHDL, LDLDIRECT in the last 72 hours. Anemia Panel: No results for input(s): VITAMINB12, FOLATE, FERRITIN, TIBC, IRON, RETICCTPCT in the last 72 hours. Urine analysis:    Component Value Date/Time   COLORURINE YELLOW 04/25/2021 1547   APPEARANCEUR HAZY (A) 04/25/2021 1547   LABSPEC 1.029 04/25/2021 1547   PHURINE 6.5 04/25/2021 1547   GLUCOSEU NEGATIVE 04/25/2021 1547   HGBUR MODERATE (A) 04/25/2021 1547   BILIRUBINUR NEGATIVE 04/25/2021 1547   KETONESUR NEGATIVE 04/25/2021 1547   PROTEINUR 30 (A) 04/25/2021 1547   UROBILINOGEN 0.2 05/15/2007 1118   NITRITE POSITIVE (A) 04/25/2021 1547   LEUKOCYTESUR LARGE (A) 04/25/2021 1547   Sepsis Labs: Invalid input(s): PROCALCITONIN,  LACTICIDVEN   Time coordinating discharge: 45 minutes  SIGNED:  Mercy Riding, MD  [facility] 04/28/2021, 4:15 PM

## 2021-04-28 NOTE — Progress Notes (Signed)
AVS given and reviewed with pt and granddaughter, [name], at bedside. Medications discussed. All questions answered to satisfaction. Pt and [name] verbalized understanding of information given. Pt escorted off the unit with all belongings via wheelchair by staff member.  ?

## 2021-04-28 NOTE — Plan of Care (Signed)
  Problem: Coping: Goal: Level of anxiety will decrease Outcome: Progressing   Problem: Pain Managment: Goal: General experience of comfort will improve Outcome: Progressing   Problem: Safety: Goal: Ability to remain free from injury will improve Outcome: Progressing   Problem: Skin Integrity: Goal: Risk for impaired skin integrity will decrease Outcome: Progressing   

## 2021-04-29 ENCOUNTER — Telehealth: Payer: Self-pay | Admitting: Neurology

## 2021-04-29 NOTE — Telephone Encounter (Signed)
[facility]) requesting verbal orders for resuming homecare services after coming home from the hospital. ?

## 2021-04-29 NOTE — Telephone Encounter (Signed)
I returned the call to [place] at [facility]. They will resume his home health orders.  ?

## 2021-04-30 LAB — CULTURE, BLOOD (ROUTINE X 2)
Culture: NO GROWTH
Culture: NO GROWTH
Special Requests: ADEQUATE
Special Requests: ADEQUATE

## 2021-05-01 ENCOUNTER — Other Ambulatory Visit: Payer: Self-pay

## 2021-05-01 ENCOUNTER — Inpatient Hospital Stay: Payer: Medicare HMO | Attending: Oncology | Admitting: Oncology

## 2021-05-01 VITALS — BP 164/98 | HR 90 | Resp 18 | Wt 128.1 lb

## 2021-05-01 DIAGNOSIS — J432 Centrilobular emphysema: Secondary | ICD-10-CM | POA: Insufficient documentation

## 2021-05-01 DIAGNOSIS — N35919 Unspecified urethral stricture, male, unspecified site: Secondary | ICD-10-CM | POA: Insufficient documentation

## 2021-05-01 DIAGNOSIS — R509 Fever, unspecified: Secondary | ICD-10-CM | POA: Insufficient documentation

## 2021-05-01 DIAGNOSIS — R590 Localized enlarged lymph nodes: Secondary | ICD-10-CM | POA: Insufficient documentation

## 2021-05-01 DIAGNOSIS — Z85828 Personal history of other malignant neoplasm of skin: Secondary | ICD-10-CM | POA: Diagnosis not present

## 2021-05-01 DIAGNOSIS — M4319 Spondylolisthesis, multiple sites in spine: Secondary | ICD-10-CM | POA: Diagnosis not present

## 2021-05-01 DIAGNOSIS — I252 Old myocardial infarction: Secondary | ICD-10-CM | POA: Diagnosis not present

## 2021-05-01 DIAGNOSIS — N2889 Other specified disorders of kidney and ureter: Secondary | ICD-10-CM | POA: Diagnosis not present

## 2021-05-01 DIAGNOSIS — N529 Male erectile dysfunction, unspecified: Secondary | ICD-10-CM | POA: Diagnosis not present

## 2021-05-01 DIAGNOSIS — M4317 Spondylolisthesis, lumbosacral region: Secondary | ICD-10-CM | POA: Insufficient documentation

## 2021-05-01 DIAGNOSIS — N5089 Other specified disorders of the male genital organs: Secondary | ICD-10-CM | POA: Diagnosis not present

## 2021-05-01 DIAGNOSIS — Z87891 Personal history of nicotine dependence: Secondary | ICD-10-CM | POA: Insufficient documentation

## 2021-05-01 DIAGNOSIS — Z79899 Other long term (current) drug therapy: Secondary | ICD-10-CM | POA: Diagnosis not present

## 2021-05-01 DIAGNOSIS — I10 Essential (primary) hypertension: Secondary | ICD-10-CM | POA: Insufficient documentation

## 2021-05-01 DIAGNOSIS — G301 Alzheimer's disease with late onset: Secondary | ICD-10-CM | POA: Diagnosis not present

## 2021-05-01 DIAGNOSIS — I2510 Atherosclerotic heart disease of native coronary artery without angina pectoris: Secondary | ICD-10-CM | POA: Insufficient documentation

## 2021-05-01 DIAGNOSIS — C61 Malignant neoplasm of prostate: Secondary | ICD-10-CM | POA: Insufficient documentation

## 2021-05-01 DIAGNOSIS — Z8546 Personal history of malignant neoplasm of prostate: Secondary | ICD-10-CM | POA: Diagnosis not present

## 2021-05-01 DIAGNOSIS — F02B0 Dementia in other diseases classified elsewhere, moderate, without behavioral disturbance, psychotic disturbance, mood disturbance, and anxiety: Secondary | ICD-10-CM | POA: Insufficient documentation

## 2021-05-01 DIAGNOSIS — Z9079 Acquired absence of other genital organ(s): Secondary | ICD-10-CM | POA: Diagnosis not present

## 2021-05-01 DIAGNOSIS — I700 Atherosclerosis of aorta: Secondary | ICD-10-CM | POA: Insufficient documentation

## 2021-05-01 DIAGNOSIS — M47812 Spondylosis without myelopathy or radiculopathy, cervical region: Secondary | ICD-10-CM | POA: Diagnosis not present

## 2021-05-01 DIAGNOSIS — Z818 Family history of other mental and behavioral disorders: Secondary | ICD-10-CM | POA: Insufficient documentation

## 2021-05-01 DIAGNOSIS — N4889 Other specified disorders of penis: Secondary | ICD-10-CM | POA: Insufficient documentation

## 2021-05-01 DIAGNOSIS — Z823 Family history of stroke: Secondary | ICD-10-CM | POA: Insufficient documentation

## 2021-05-01 DIAGNOSIS — Z8249 Family history of ischemic heart disease and other diseases of the circulatory system: Secondary | ICD-10-CM | POA: Insufficient documentation

## 2021-05-01 DIAGNOSIS — Z836 Family history of other diseases of the respiratory system: Secondary | ICD-10-CM | POA: Insufficient documentation
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
## 2021-05-02 ENCOUNTER — Ambulatory Visit: Payer: Medicare HMO | Admitting: Neurology

## 2021-05-07 ENCOUNTER — Telehealth: Payer: Self-pay | Admitting: Neurology

## 2021-05-07 NOTE — Telephone Encounter (Signed)
Solmon Ice, RN w/ [facility] has called for verbal orders for Medical Social Worker to come out and speak with family about companion care for pt.  Gwennette, RN's vm is secure ?

## 2021-05-07 NOTE — Telephone Encounter (Signed)
Verbal orders provided. ?

## 2021-05-07 NOTE — Telephone Encounter (Signed)
Left message to proceed with the orders below. ?

## 2021-05-07 NOTE — Telephone Encounter (Signed)
At 12:30 Leigh, PT with [facility] left a vm to advise pt's wife has passed, and pt is asking the PT evaluation be moved to week 03-20.  Leigh,PT is asking for verbal orders for the eval. To be next week, she can be reached at [phone], secure vm. ?

## 2021-05-08 NOTE — Telephone Encounter (Signed)
[facility] (Leann) Pt asking PT be moved to next week. Pt wife passed away. ?

## 2021-05-08 NOTE — Telephone Encounter (Signed)
I returned the call to [facility]. Left message that it is okay to reschedule his home health services until next week.  ?

## 2021-05-08 NOTE — Telephone Encounter (Signed)
[facility] (Leann) Pt asking social work evaluation been moved to next week. Pt wife passed away. ?

## 2021-05-10 ENCOUNTER — Telehealth: Payer: Self-pay

## 2021-05-10 NOTE — Telephone Encounter (Signed)
Attempted to contact patient's EC Vinnie Level to schedule a Palliative Care consult appointment. No answer left a message to return call.  ?

## 2021-05-14 ENCOUNTER — Telehealth: Payer: Self-pay

## 2021-05-14 NOTE — Telephone Encounter (Signed)
Spoke with patient's granddaughter Asencion Partridge and scheduled a Mychart Palliative Consult for 05/20/21 @ 11 AM.  ? ?Consent obtained; updated Outlook/Netsmart/Team List and Epic.  ? ?

## 2021-05-15 ENCOUNTER — Telehealth: Payer: Self-pay | Admitting: Neurology

## 2021-05-15 NOTE — Telephone Encounter (Signed)
I called physical therapist back and provided requested verbal order. ?She verbalized understanding and appreciation for the call.  ?

## 2021-05-15 NOTE — Telephone Encounter (Signed)
Andre Casey from [facility] called needing VO for Home Health PT for 2x 3 weeks then 1x 3 weeks ?

## 2021-05-16 HISTORY — PX: INCISION AND DRAINAGE ABSCESS: SHX5864

## 2021-05-16 HISTORY — PX: REMOVAL OF PENILE PROSTHESIS: SHX6059

## 2021-05-16 HISTORY — PX: INSERTION OF SUPRAPUBIC CATHETER: SHX5870

## 2021-05-20 ENCOUNTER — Telehealth: Payer: Medicare HMO | Admitting: Family Medicine

## 2021-05-20 ENCOUNTER — Other Ambulatory Visit: Payer: Self-pay

## 2021-06-12 ENCOUNTER — Telehealth: Payer: Self-pay

## 2021-06-12 NOTE — Telephone Encounter (Signed)
Patient discharged home from facility.Spoke with patient's granddaughter Asencion Partridge and scheduled a Mychart Palliative Consult for 06/17/21 @ 2:30 PM.  ? ?Consent obtained; updated Netsmart, Team List and Epic.  ? ?

## 2021-06-17 ENCOUNTER — Telehealth: Payer: Medicare HMO | Admitting: Family Medicine

## 2021-06-17 ENCOUNTER — Encounter: Payer: Self-pay | Admitting: Family Medicine

## 2021-06-17 DIAGNOSIS — N492 Inflammatory disorders of scrotum: Secondary | ICD-10-CM

## 2021-06-17 DIAGNOSIS — C61 Malignant neoplasm of prostate: Secondary | ICD-10-CM

## 2021-06-17 DIAGNOSIS — Z515 Encounter for palliative care: Secondary | ICD-10-CM

## 2021-06-17 NOTE — Progress Notes (Signed)
? ? ?Manufacturing engineer ?Community Palliative Care Consult Note ?Telephone: [phone]  ?Fax: [phone]  ? ?Date of encounter: 06/17/21 ?10:58 PM ?PATIENT NAME: Andre Casey. ?[address] ?[place] [address]   ?[phone] (home)  ?DOB: 06/21/1929 ?MRN: [id number] ?PRIMARY CARE PROVIDER:    ?[name], MD,  ?[address] ?[place] [address] ?[phone] ? ?REFERRING PROVIDER:   ?[name], MD ?[address] ?[address] ?[place],  [place] [address] ?[phone] ? ?RESPONSIBLE PARTY:    ?Contact Information   ? ? Name Relation Home Work Mobile  ? Odean, Fester Son [phone]  [phone]  ? Turner,[name] Granddaughter [phone]  [phone]  ? Troxler,(SD)Suzanne Other [phone]  [phone]  ? Shona Simpson Daughter   [phone]  ? ?  ? ?I connected with  Andre Casey. on 06/18/21 by a video enabled telemedicine application and verified that I am speaking with the correct person using two identifiers. ?  ?I discussed the limitations of evaluation and management by telemedicine. The patient expressed understanding and agreed to proceed.  ? ?Palliative Care was asked to follow this patient by consultation request of  [name], MD to address advance care planning and complex medical decision making. This is the initial visit.  ? ? ?      ASSESSMENT, SYMPTOM MANAGEMENT AND PLAN / RECOMMENDATIONS:  ? Prostate cancer metastatic to multiple sites ?Possible renal mass, bony mets in lumbar spine and pancreatic mass. ?Continue to follow up with [facility] Urologist ?SP cath in place due to urethral stricture and urinary retention.  Nursing agency or Urology to replace SP cath. ?Continue Tramadol Q 6 hrs prn pain. ? ? Scrotal abscess ?No evidence of current infection, healing by secondary intention. ?Home Health nursing to teach packing and dressing changes, monitor for infection. ? ?  Palliative Care Encounter ?Address code status  and goals of care at in-person visit. ? ? ?Follow up Palliative Care Visit: Palliative care will continue to follow for complex medical decision making, advance care planning, and clarification of goals. Return 2-3 weeks or prn. ? ? ? ?This visit was coded based on medical decision making (MDM). ? ?PPS: 60% ? ?HOSPICE ELIGIBILITY/DIAGNOSIS: TBD ? ?Chief Complaint:  ?[facility] received a referral to follow up with patient for chronic disease management with metastatic prostate cancer.  Follow up is also for advance directive planning and defining/refining goals of care. ? ? ?HISTORY OF PRESENT ILLNESS:  Andre Casey. is a 88 y.o. year old male with metastatic prostate cancer, right renal mass/pancreatic mass, physical deconditioning, urinary incontinence with indwelling suprapubic catheter, penile prosthesis implant with recent removal due to urinary retention on 05/16/21) CKD, Alzheimers, HTN, recent Covid 19 infection (04/15/21), normocytic anemia and CAD.  Pt has had 18 lb weight loss over the last month but had surgery to place suprapubic catheter. He is overall eating less than he used to but endorses good appetite, no trouble with coughing or choking after eating, denies trouble sleeping.  Has had some falls. Reports cervical disc disease with cervical spinal stenosis and numbness in bilateral fingertips. He had a secondary scrotal abscess from the penile prosthesis which was evacuated when the prosthesis was removed and is being allowed to heal by secondary intention.  He has a 20 Fr SP cath in place per notes from [facility].  He is packing the scrotum twice daily and has had assistance of home health nurse. Last labs from 04/28/21 and pt  was still testing positive for SARS-CoV-2:  WBC normal 9.9, low RBC 3.14, HGB 9.0, HCT 28.8% with high PLT 504.  CMP 04/25/21:  Na 129, CL 94, glucose 136, BUN 26, Cr 1.52, Albumin 3.2 and eGFR 44.  Albumin as of 04/28/21 was  low at 2.0. UA from 04/25/21 with large leukocytes, WBCs > 50 per HPF and positive for nitrites with many bacteria ?With cath urine cx with > 100,000 Klebsiella pneumoniae and > 80,000 Staph aureus.  CT abd/pelvis with contrast 04/25/21: ?Penile and scrotal soft tissue swelling and edema. No ?abscess or free fluid is seen within the pelvis. Stable pancreatic head 1.3 cm hypodense mass. This demonstrates ?stability on multiple prior CTs and MRI 02/15/2019.  Lateral right kidney enhancing exophytic mass again concerning for renal cell carcinoma.  Stable bulky retroperitoneal lymphadenopathy and left inguinal adenopathy concerning for metastatic disease. ?Postsurgical changes of prostatectomy and pelvic lymph node ?dissection. NM PET whole body scan 03/13/21:  Uptake in L4 vertebral body, cannot exclude metastasis.  RIGHT hydronephrosis. ECHO 05/03/20 essentially normal with EF 55-60% and no significant valvular disease. ? ? ?History obtained from review of EMR, discussion with granddaughter, Andre Casey and/or Andre Casey.  ?I reviewed available labs, medications, imaging, studies and related documents from the EMR.  Records reviewed and summarized above.  ? ?ROS ?General: NAD ?EYES: denies vision changes ?ENMT: denies dysphagia ?Cardiovascular: denies chest pain, denies DOE ?Pulmonary: denies cough, denies increased SOB ?Abdomen: endorses good appetite, denies constipation ?GU: denies dysuria, implanted suprapubic catheter ?MSK:   endorses increased weakness, hx of falls reported, + for neck pain ?Skin: denies rashes or wounds ?Neurological: + numbness and tingling of distal fingertips, denies insomnia ?Psych: Endorses positive mood ?Heme/lymph/immuno: denies bruises, abnormal bleeding ? ?Physical Exam: ?Current and past weights: 128 lbs as of 05/01/21, weight 135 lbs on 03/11/21 ?Constitutional: NAD ?General: frail appearing, thin ?EYES: lids intact, no discharge  ?ENMT: intact hearing ?CV: No visible cyanosis, able to  speak in complete sentences without having to stop to breathe ?Pulmonary: no cough, no audible wheezing, dyspnea on room air ?Neuro:  some short term memory deficits ?Psych: non-anxious affect, A and O x 3 ? ? ?CURRENT PROBLEM LIST:  ?Patient Active Problem List  ? Diagnosis Date Noted  ? Physical deconditioning 04/28/2021  ? Bandemia 04/27/2021  ? Severe sepsis ([place]) 04/26/2021  ? Essential hypertension 04/26/2021  ? UTI (urinary tract infection) due to urinary indwelling Foley catheter ([place]) 04/25/2021  ? AKI on CKD-3A 04/25/2021  ? Hyponatremia 04/25/2021  ? Normocytic anemia 04/25/2021  ? Pancreatic mass 04/25/2021  ? Lab test positive for detection of COVID-19 virus 04/15/2021  ? Prostate cancer metastatic to multiple sites [facility]) 04/15/2021  ? Chronic indwelling Foley catheter 04/15/2021  ? Moderate late onset Alzheimer's dementia ([place]) 04/08/2021  ? Gait abnormality 04/08/2021  ? Renal insufficiency 03/12/2021  ? Renal mass, right 12/04/2019  ? LBBB (left bundle branch block) 06/07/2019  ? Family history of Alzheimer's disease 03/25/2018  ? Atherosclerotic heart disease of native coronary artery with angina pectoris ([place]) 01/15/2016  ? CAD-S/P PCI/DES 01/15/2016  ? Family history of sudden cardiac death 2016/02/03  ? Dyslipidemia, goal LDL below 70 01/08/2016  ? Paresthesia 07/18/2015  ? S/P shoulder replacement 04/20/2015  ? Malignant neoplasm of prostate ([place]) 02/09/2015  ? Urethral stricture 07/25/2013  ? History of prostate cancer 08/04/2011  ? ED (erectile dysfunction) of organic origin 02/03/2011  ? Male urinary stress incontinence 01/31/2011  ? HEMORRHOIDS-EXTERNAL 08/02/2007  ? INCONTINENCE, FECAL  08/02/2007  ? PERSONAL HX COLONIC POLYPS 08/02/2007  ? ?PAST MEDICAL HISTORY:  ?Active Ambulatory Problems  ?  Diagnosis Date Noted  ? HEMORRHOIDS-EXTERNAL 08/02/2007  ? INCONTINENCE, FECAL 08/02/2007  ? PERSONAL HX COLONIC POLYPS 08/02/2007  ? S/P shoulder replacement 04/20/2015  ? Paresthesia 07/18/2015  ?  Dyslipidemia, goal LDL below 70 01/08/2016  ? ED (erectile dysfunction) of organic origin 02/03/2011  ? Male urinary stress incontinence 01/31/2011  ? Urethral stricture 07/25/2013  ? History of prostate c

## 2021-06-18 ENCOUNTER — Encounter: Payer: Self-pay | Admitting: Family Medicine

## 2021-06-18 DIAGNOSIS — N492 Inflammatory disorders of scrotum: Secondary | ICD-10-CM | POA: Insufficient documentation

## 2021-06-18 DIAGNOSIS — Z515 Encounter for palliative care: Secondary | ICD-10-CM | POA: Insufficient documentation

## 2021-06-18 HISTORY — DX: Inflammatory disorders of scrotum: N49.2

## 2021-07-03 ENCOUNTER — Inpatient Hospital Stay (HOSPITAL_COMMUNITY)
Admission: EM | Admit: 2021-07-03 | Discharge: 2021-07-10 | DRG: 698 | Disposition: A | Discharge status: Skilled Nursing Facility | Payer: Medicare HMO | Attending: Family Medicine | Admitting: Family Medicine

## 2021-07-03 ENCOUNTER — Emergency Department (HOSPITAL_COMMUNITY): Payer: Medicare HMO

## 2021-07-03 ENCOUNTER — Other Ambulatory Visit: Payer: Self-pay

## 2021-07-03 ENCOUNTER — Observation Stay (HOSPITAL_COMMUNITY): Payer: Medicare HMO

## 2021-07-03 ENCOUNTER — Other Ambulatory Visit: Payer: Medicare HMO | Admitting: Family Medicine

## 2021-07-03 ENCOUNTER — Encounter (HOSPITAL_COMMUNITY): Payer: Self-pay | Admitting: Emergency Medicine

## 2021-07-03 DIAGNOSIS — Z79899 Other long term (current) drug therapy: Secondary | ICD-10-CM

## 2021-07-03 DIAGNOSIS — F0280 Dementia in other diseases classified elsewhere without behavioral disturbance: Secondary | ICD-10-CM | POA: Diagnosis present

## 2021-07-03 DIAGNOSIS — E43 Unspecified severe protein-calorie malnutrition: Secondary | ICD-10-CM | POA: Insufficient documentation

## 2021-07-03 DIAGNOSIS — N3000 Acute cystitis without hematuria: Secondary | ICD-10-CM | POA: Diagnosis not present

## 2021-07-03 DIAGNOSIS — N390 Urinary tract infection, site not specified: Secondary | ICD-10-CM | POA: Diagnosis not present

## 2021-07-03 DIAGNOSIS — I25119 Atherosclerotic heart disease of native coronary artery with unspecified angina pectoris: Secondary | ICD-10-CM | POA: Diagnosis present

## 2021-07-03 DIAGNOSIS — M19031 Primary osteoarthritis, right wrist: Secondary | ICD-10-CM | POA: Diagnosis present

## 2021-07-03 DIAGNOSIS — K8689 Other specified diseases of pancreas: Secondary | ICD-10-CM | POA: Diagnosis present

## 2021-07-03 DIAGNOSIS — Z87891 Personal history of nicotine dependence: Secondary | ICD-10-CM

## 2021-07-03 DIAGNOSIS — T83510A Infection and inflammatory reaction due to cystostomy catheter, initial encounter: Secondary | ICD-10-CM | POA: Diagnosis not present

## 2021-07-03 DIAGNOSIS — Z6820 Body mass index (BMI) 20.0-20.9, adult: Secondary | ICD-10-CM

## 2021-07-03 DIAGNOSIS — T83511A Infection and inflammatory reaction due to indwelling urethral catheter, initial encounter: Principal | ICD-10-CM | POA: Diagnosis present

## 2021-07-03 DIAGNOSIS — Z96611 Presence of right artificial shoulder joint: Secondary | ICD-10-CM | POA: Diagnosis present

## 2021-07-03 DIAGNOSIS — Z85828 Personal history of other malignant neoplasm of skin: Secondary | ICD-10-CM

## 2021-07-03 DIAGNOSIS — E876 Hypokalemia: Secondary | ICD-10-CM | POA: Diagnosis present

## 2021-07-03 DIAGNOSIS — L03119 Cellulitis of unspecified part of limb: Secondary | ICD-10-CM | POA: Diagnosis not present

## 2021-07-03 DIAGNOSIS — F02B0 Dementia in other diseases classified elsewhere, moderate, without behavioral disturbance, psychotic disturbance, mood disturbance, and anxiety: Secondary | ICD-10-CM | POA: Diagnosis present

## 2021-07-03 DIAGNOSIS — Z955 Presence of coronary angioplasty implant and graft: Secondary | ICD-10-CM

## 2021-07-03 DIAGNOSIS — G301 Alzheimer's disease with late onset: Secondary | ICD-10-CM | POA: Diagnosis present

## 2021-07-03 DIAGNOSIS — Z923 Personal history of irradiation: Secondary | ICD-10-CM

## 2021-07-03 DIAGNOSIS — I252 Old myocardial infarction: Secondary | ICD-10-CM

## 2021-07-03 DIAGNOSIS — C61 Malignant neoplasm of prostate: Secondary | ICD-10-CM | POA: Diagnosis present

## 2021-07-03 DIAGNOSIS — R5381 Other malaise: Secondary | ICD-10-CM | POA: Diagnosis present

## 2021-07-03 DIAGNOSIS — M659 Synovitis and tenosynovitis, unspecified: Secondary | ICD-10-CM | POA: Diagnosis present

## 2021-07-03 DIAGNOSIS — C7951 Secondary malignant neoplasm of bone: Secondary | ICD-10-CM | POA: Diagnosis present

## 2021-07-03 DIAGNOSIS — Y846 Urinary catheterization as the cause of abnormal reaction of the patient, or of later complication, without mention of misadventure at the time of the procedure: Secondary | ICD-10-CM | POA: Diagnosis present

## 2021-07-03 DIAGNOSIS — Z825 Family history of asthma and other chronic lower respiratory diseases: Secondary | ICD-10-CM

## 2021-07-03 DIAGNOSIS — N35919 Unspecified urethral stricture, male, unspecified site: Secondary | ICD-10-CM | POA: Diagnosis present

## 2021-07-03 DIAGNOSIS — Z823 Family history of stroke: Secondary | ICD-10-CM

## 2021-07-03 DIAGNOSIS — Z9079 Acquired absence of other genital organ(s): Secondary | ICD-10-CM

## 2021-07-03 DIAGNOSIS — L03113 Cellulitis of right upper limb: Secondary | ICD-10-CM | POA: Diagnosis present

## 2021-07-03 DIAGNOSIS — I10 Essential (primary) hypertension: Secondary | ICD-10-CM | POA: Diagnosis present

## 2021-07-03 DIAGNOSIS — Z8616 Personal history of COVID-19: Secondary | ICD-10-CM

## 2021-07-03 DIAGNOSIS — Z91128 Patient's intentional underdosing of medication regimen for other reason: Secondary | ICD-10-CM

## 2021-07-03 DIAGNOSIS — T465X6A Underdosing of other antihypertensive drugs, initial encounter: Secondary | ICD-10-CM | POA: Diagnosis present

## 2021-07-03 DIAGNOSIS — E7800 Pure hypercholesterolemia, unspecified: Secondary | ICD-10-CM | POA: Diagnosis present

## 2021-07-03 DIAGNOSIS — Z8249 Family history of ischemic heart disease and other diseases of the circulatory system: Secondary | ICD-10-CM

## 2021-07-03 DIAGNOSIS — M25531 Pain in right wrist: Secondary | ICD-10-CM | POA: Diagnosis present

## 2021-07-03 DIAGNOSIS — Z66 Do not resuscitate: Secondary | ICD-10-CM | POA: Diagnosis present

## 2021-07-03 LAB — COMPREHENSIVE METABOLIC PANEL
ALT: 12 U/L (ref 0–44)
AST: 21 U/L (ref 15–41)
Albumin: 3.3 g/dL — ABNORMAL LOW (ref 3.5–5.0)
Alkaline Phosphatase: 70 U/L (ref 38–126)
Anion gap: 10 (ref 5–15)
BUN: 15 mg/dL (ref 8–23)
CO2: 23 mmol/L (ref 22–32)
Calcium: 8.8 mg/dL — ABNORMAL LOW (ref 8.9–10.3)
Chloride: 103 mmol/L (ref 98–111)
Creatinine, Ser: 1 mg/dL (ref 0.61–1.24)
GFR, Estimated: >60 mL/min (ref >60)
Glucose, Bld: 111 mg/dL — ABNORMAL HIGH (ref 70–99)
Potassium: 3.4 mmol/L — ABNORMAL LOW (ref 3.5–5.1)
Sodium: 136 mmol/L (ref 135–145)
Total Bilirubin: 0.8 mg/dL (ref 0.3–1.2)
Total Protein: 6.9 g/dL (ref 6.5–8.1)

## 2021-07-03 LAB — CBC WITH DIFFERENTIAL/PLATELET
Abs Immature Granulocytes: 0.06 10*3/uL (ref 0.00–0.07)
Basophils Absolute: 0.1 10*3/uL (ref 0.0–0.1)
Basophils Relative: 1 %
Eosinophils Absolute: 0.1 10*3/uL (ref 0.0–0.5)
Eosinophils Relative: 1 %
HCT: 36.9 % — ABNORMAL LOW (ref 39.0–52.0)
Hemoglobin: 12 g/dL — ABNORMAL LOW (ref 13.0–17.0)
Immature Granulocytes: 1 %
Lymphocytes Relative: 20 %
Lymphs Abs: 2.1 10*3/uL (ref 0.7–4.0)
MCH: 28.1 pg (ref 26.0–34.0)
MCHC: 32.5 g/dL (ref 30.0–36.0)
MCV: 86.4 fL (ref 80.0–100.0)
Monocytes Absolute: 0.9 10*3/uL (ref 0.1–1.0)
Monocytes Relative: 9 %
Neutro Abs: 7.4 10*3/uL (ref 1.7–7.7)
Neutrophils Relative %: 68 %
Platelets: 378 10*3/uL (ref 150–400)
RBC: 4.27 MIL/uL (ref 4.22–5.81)
RDW: 15 % (ref 11.5–15.5)
WBC: 10.7 10*3/uL — ABNORMAL HIGH (ref 4.0–10.5)
nRBC: 0 % (ref 0.0–0.2)

## 2021-07-03 LAB — HEPATIC FUNCTION PANEL
ALT: 9 U/L (ref 0–44)
AST: 18 U/L (ref 15–41)
Albumin: 3 g/dL — ABNORMAL LOW (ref 3.5–5.0)
Alkaline Phosphatase: 61 U/L (ref 38–126)
Bilirubin, Direct: 0.1 mg/dL (ref 0.0–0.2)
Indirect Bilirubin: 0.8 mg/dL (ref 0.3–0.9)
Total Bilirubin: 0.9 mg/dL (ref 0.3–1.2)
Total Protein: 6.2 g/dL — ABNORMAL LOW (ref 6.5–8.1)

## 2021-07-03 LAB — CK: Total CK: 46 U/L — ABNORMAL LOW (ref 49–397)

## 2021-07-03 LAB — MAGNESIUM: Magnesium: 2 mg/dL (ref 1.7–2.4)

## 2021-07-03 LAB — LACTIC ACID, PLASMA
Lactic Acid, Venous: 2.4 mmol/L (ref 0.5–1.9)
Lactic Acid, Venous: 1.6 mmol/L (ref 0.5–1.9)

## 2021-07-03 LAB — PHOSPHORUS: Phosphorus: 2.4 mg/dL — ABNORMAL LOW (ref 2.5–4.6)

## 2021-07-03 LAB — URIC ACID: Uric Acid, Serum: 4.9 mg/dL (ref 3.7–8.6)

## 2021-07-03 MED ORDER — MEMANTINE HCL 10 MG PO TABS
10.0000 mg | ORAL_TABLET | Freq: Two times a day (BID) | ORAL | Status: DC
  Administered 2021-07-04 – 2021-07-10 (×14): 10 mg via ORAL
  Filled 2021-07-03 (×14): qty 1

## 2021-07-03 MED ORDER — ACETAMINOPHEN 325 MG PO TABS
650.0000 mg | ORAL_TABLET | Freq: Four times a day (QID) | ORAL | Status: DC | PRN
  Administered 2021-07-07: 650 mg via ORAL
  Filled 2021-07-03: qty 2

## 2021-07-03 MED ORDER — TRAMADOL HCL 50 MG PO TABS
50.0000 mg | ORAL_TABLET | Freq: Four times a day (QID) | ORAL | Status: DC | PRN
  Administered 2021-07-04 – 2021-07-07 (×3): 50 mg via ORAL
  Filled 2021-07-03 (×3): qty 1

## 2021-07-03 MED ORDER — SODIUM CHLORIDE 0.9 % IV SOLN
2.0000 g | Freq: Two times a day (BID) | INTRAVENOUS | Status: AC
  Administered 2021-07-04 – 2021-07-07 (×8): 2 g via INTRAVENOUS
  Filled 2021-07-03 (×8): qty 12.5

## 2021-07-03 MED ORDER — ACETAMINOPHEN 650 MG RE SUPP: 650.0000 mg | Freq: Four times a day (QID) | RECTAL | Status: DC | PRN

## 2021-07-03 MED ORDER — SODIUM CHLORIDE 0.9 % IV SOLN
2.0000 g | Freq: Once | INTRAVENOUS | Status: AC
  Administered 2021-07-03: 2 g via INTRAVENOUS

## 2021-07-03 MED ORDER — VANCOMYCIN HCL IN DEXTROSE 1-5 GM/200ML-% IV SOLN
1000.0000 mg | Freq: Once | INTRAVENOUS | Status: AC
  Administered 2021-07-03: 1000 mg via INTRAVENOUS

## 2021-07-03 MED ORDER — ACETAMINOPHEN 500 MG PO TABS
ORAL_TABLET | ORAL | Status: AC
  Filled 2021-07-03: qty 2

## 2021-07-03 MED ORDER — ACETAMINOPHEN 500 MG PO TABS
1000.0000 mg | ORAL_TABLET | Freq: Once | ORAL | Status: AC
  Administered 2021-07-03: 1000 mg via ORAL

## 2021-07-03 MED ORDER — HYDROCODONE-ACETAMINOPHEN 5-325 MG PO TABS
1.0000 | ORAL_TABLET | ORAL | Status: DC | PRN
  Administered 2021-07-04 – 2021-07-05 (×4): 1 via ORAL
  Administered 2021-07-06: 2 via ORAL
  Administered 2021-07-06: 1 via ORAL
  Administered 2021-07-06: 2 via ORAL
  Administered 2021-07-06: 1 via ORAL
  Filled 2021-07-03 (×5): qty 1
  Filled 2021-07-03: qty 2
  Filled 2021-07-03: qty 1
  Filled 2021-07-03: qty 2

## 2021-07-03 MED ORDER — IOHEXOL 300 MG/ML  SOLN
80.0000 mL | Freq: Once | INTRAMUSCULAR | Status: AC | PRN
  Administered 2021-07-03: 80 mL via INTRAVENOUS

## 2021-07-03 MED ORDER — SENNOSIDES-DOCUSATE SODIUM 8.6-50 MG PO TABS: 1.0000 | ORAL_TABLET | Freq: Every evening | ORAL | Status: DC | PRN

## 2021-07-03 MED ORDER — SODIUM CHLORIDE 0.9 % IV SOLN
75.0000 mL/h | INTRAVENOUS | Status: DC
  Administered 2021-07-03: 75 mL/h via INTRAVENOUS

## 2021-07-03 MED ORDER — VANCOMYCIN HCL IN DEXTROSE 1-5 GM/200ML-% IV SOLN
INTRAVENOUS | Status: AC
  Filled 2021-07-03: qty 200

## 2021-07-03 MED ORDER — POTASSIUM CHLORIDE 10 MEQ/100ML IV SOLN
10.0000 meq | INTRAVENOUS | Status: AC
  Administered 2021-07-03 (×2): 10 meq via INTRAVENOUS
  Filled 2021-07-03: qty 100

## 2021-07-03 MED ORDER — ASPIRIN EC 81 MG PO TBEC
81.0000 mg | DELAYED_RELEASE_TABLET | Freq: Every day | ORAL | Status: DC
  Administered 2021-07-04 – 2021-07-10 (×7): 81 mg via ORAL
  Filled 2021-07-03 (×7): qty 1

## 2021-07-03 MED ORDER — LACTATED RINGERS IV BOLUS
1000.0000 mL | Freq: Once | INTRAVENOUS | Status: AC
  Administered 2021-07-03: 1000 mL via INTRAVENOUS

## 2021-07-03 MED ORDER — VANCOMYCIN HCL 750 MG/150ML IV SOLN
750.0000 mg | INTRAVENOUS | Status: DC
  Filled 2021-07-03: qty 150

## 2021-07-03 MED ORDER — SODIUM CHLORIDE 0.9 % IV SOLN
INTRAVENOUS | Status: AC
  Filled 2021-07-03: qty 12.5

## 2021-07-03 NOTE — Assessment & Plan Note (Signed)
continue broad spectrum antibiotics for now ?

## 2021-07-03 NOTE — Assessment & Plan Note (Signed)
-   will replace and repeat in AM,  check magnesium level and replace as needed ° °

## 2021-07-03 NOTE — Assessment & Plan Note (Signed)
Sp chronic indwelling foley, followed by [facility] ?

## 2021-07-03 NOTE — Subjective & Objective (Signed)
Coming from home due to high BP and possible UTI ?His urine has been red and foul and he stopped taking his BP mes ?Reports abdominal pain  ?EMS noted BP 160/90 and fever of 101.6 ?Pt w hx of suprapubic catheter history of prostate cancer most bone metastases and dementia.  Followed by urology at [facility] ?He has known history of scrotal abscess that was caused by urethral erosion of inflatable penile prosthesis that was removed in March 2023 since then he required suprapubic catheter noted some drainage around the site. ?He has been at home since April ? ?

## 2021-07-03 NOTE — ED Triage Notes (Signed)
Patient presents from home via EMS due to hypertension and suspected UTI. He has not been taking his medication for BP. Foul urine and a red, infected looking site post cath removal was reported by EMS. Patient complains of abdominal pain. He is A & O x 3, but can not recall the date.  ? ?Hx: Dementia ? ?EMS vitals: ?18 RR ?29 ET ?160/90 BP ?117 CBG ?101.6 Temp ?110 HR ? ?

## 2021-07-03 NOTE — Progress Notes (Signed)
[facility] ED09 [facility]) Hospital Liaison note: ? ?This patient is currently enrolled in [facility] outpatient-based Palliative Care. Will continue to follow for disposition. ? ?Please call with any outpatient palliative questions or concerns. ? ?Thank you, ?Lorelee Market, LPN ?[facility] Hospital Liaison ?[phone] ?

## 2021-07-03 NOTE — ED Notes (Signed)
Undressed pt for exam. Pt brief soiled with feces dried to skin/ Feces noted on suprapubic catheter. Pericare and foley care performed. Barrier cream applied, redness noted to backside with beginnings of skin breakdown. Sacral Mepilex applied. ?

## 2021-07-03 NOTE — Assessment & Plan Note (Signed)
Chronic, followed by URology at [facility] ?Palliative care consult  ?

## 2021-07-03 NOTE — Assessment & Plan Note (Signed)
chronic

## 2021-07-03 NOTE — Assessment & Plan Note (Signed)
Chronic stable continue aspirin and coreg ?

## 2021-07-03 NOTE — H&P (Signed)
? ? ?Andre Casey. ZDG:[id number] DOB: 10/31/1929 DOA: 07/03/2021 ? ? ?  ?PCP: [name], MD   ?Outpatient Specialists: * NONE ?CARDS:   Dr. Saunders Revel ?  ?NEurology  Dr. Krista Blue ?  ?   ? ?Patient arrived to ER on 07/03/21 at 1302 ?Referred by Attending Carmin Muskrat, MD ? ? ?Patient coming from:   ? home Lives alone,     ?  ? ?Chief Complaint:   ?Chief Complaint  ?Patient presents with  ? Hypertension  ? ? ?HPI: ?Andre Casey. is a 88 y.o. male with medical history significant of metastatic prostate cancer, right renal mass/pancreatic mass, physical deconditioning, urinary incontinence with indwelling suprapubic catheter, penile prosthesis implant with recent removal due to urinary retention on 05/16/21) CKD, Alzheimers, HTN, recent Covid 19 infection (04/15/21), normocytic anemia and CAD.   ?  ? ?Presented with   hypertension possible UTI ?Coming from home due to high BP and possible UTI ?His urine has been red and foul and he stopped taking his BP mes ?Reports abdominal pain  ?EMS noted BP 160/90 and fever of 101.6 ?Pt w hx of suprapubic catheter history of prostate cancer most bone metastases and dementia.  Followed by urology at [facility] ?He has known history of scrotal abscess that was caused by urethral erosion of inflatable penile prosthesis that was removed in March 2023 since then he required suprapubic catheter noted some drainage around the site. ?He has been at home since April ? ?   ?Regarding pertinent Chronic problems:   ?  ? ? ? HTN on Coreg normal echo in 2022 ? ?  CAD  - On Aspirin, betablocker,  ?               -  followed by cardiology ?               ? ? ?While in ER: ?Clinical Course as of 07/03/21 1842  ?Wed Jul 03, 2021  ?1739 I spoke to Dr.Borden with urology who advised that patient needs continued antibiotics but does not need his catheter changed out at this time.   ? ?  [PB]  ?  ?Clinical Course User Index ?[PB] Loni Beckwith, PA-C  ? ?  ? ?Ordered ?   ? ?CTabd/pelvis  . Suprapubic tube in place with soft tissue thickening along the ?catheter track extending from the skin surface to the balloon. No ?drainable or focal fluid collection. Urinary bladder is collapsed ?around the balloon. ?  ?Following Medications were ordered in ER: ?Medications  ?acetaminophen (TYLENOL) 500 MG tablet (has no administration in time range)  ?sodium chloride 0.9 % with ceFEPIme (MAXIPIME) ADS Med (has no administration in time range)  ?vancomycin (VANCOCIN) 1-5 GM/200ML-% IVPB (has no administration in time range)  ?acetaminophen (TYLENOL) tablet 1,000 mg (1,000 mg Oral Given 07/03/21 1544)  ?ceFEPIme (MAXIPIME) 2 g in sodium chloride 0.9 % 100 mL IVPB (0 g Intravenous Stopped 07/03/21 1610)  ?vancomycin (VANCOCIN) IVPB 1000 mg/200 mL premix (0 mg Intravenous Stopped 07/03/21 1729)  ?iohexol (OMNIPAQUE) 300 MG/ML solution 80 mL (80 mLs Intravenous Contrast Given 07/03/21 1510)  ?lactated ringers bolus 1,000 mL (0 mLs Intravenous Stopped 07/03/21 1757)  ?lactated ringers bolus 1,000 mL (1,000 mLs Intravenous New Bag/Given 07/03/21 1752)  ?  ?_______________________________________________________ ?ER Provider Called:   Urology  Dr. Alinda Money ?They Recommend admit to medicine needs antibiotics  ?Will see in AM   ?  ?ED Triage Vitals  ?Enc Vitals Group  ?  BP 07/03/21 1315 (!) 158/84  ?   Pulse Rate 07/03/21 1315 95  ?   Resp 07/03/21 1315 19  ?   Temp 07/03/21 1315 100.3 ?F (37.9 ?C)  ?   Temp Source 07/03/21 1315 Oral  ?   SpO2 07/03/21 1315 96 %  ?   Weight --   ?   Height --   ?   Head Circumference --   ?   Peak Flow --   ?   Pain Score 07/03/21 1751 Asleep  ?   Pain Loc --   ?   Pain Edu? --   ?   Excl. in [place]? --   ?HUDJ(49)@    ? _________________________________________ ?Significant initial  Findings: ?Abnormal Labs Reviewed  ?LACTIC ACID, PLASMA - Abnormal; Notable for the following components:  ?    Result Value  ? Lactic Acid, Venous 2.4 (*)   ? All other components within normal  limits  ?COMPREHENSIVE METABOLIC PANEL - Abnormal; Notable for the following components:  ? Potassium 3.4 (*)   ? Glucose, Bld 111 (*)   ? Calcium 8.8 (*)   ? Albumin 3.3 (*)   ? All other components within normal limits  ?CBC WITH DIFFERENTIAL/PLATELET - Abnormal; Notable for the following components:  ? WBC 10.7 (*)   ? Hemoglobin 12.0 (*)   ? HCT 36.9 (*)   ? All other components within normal limits  ? ?   ?ECG: Ordered ?Personally reviewed by me showing: ?HR : 63 ?Rhythm:Junctional rhythm ?Left bundle branch block ?QTC 520 ?   ?The recent clinical data is shown below. ?Vitals:  ? 07/03/21 1600 07/03/21 1630 07/03/21 1730 07/03/21 1800  ?BP: (!) 157/81 125/71 138/80 117/72  ?Pulse: 93 84 79 68  ?Resp: '16 17 15 18  '$ ?Temp:      ?TempSrc:      ?SpO2: 96% 96% 99% 98%  ?  ?  ?WBC ? ?   ?Component Value Date/Time  ? WBC 10.7 (H) 07/03/2021 1355  ? LYMPHSABS 2.1 07/03/2021 1355  ? MONOABS 0.9 07/03/2021 1355  ? EOSABS 0.1 07/03/2021 1355  ? BASOSABS 0.1 07/03/2021 1355  ?  ?Lactic Acid, Venous ?   ?Component Value Date/Time  ? LATICACIDVEN 1.6 07/03/2021 1555  ?  ? ? UA  ordered ?  ?Urine analysis: ?   ?Component Value Date/Time  ? [place] YELLOW 04/25/2021 1547  ? APPEARANCEUR HAZY (A) 04/25/2021 1547  ? LABSPEC 1.029 04/25/2021 1547  ? PHURINE 6.5 04/25/2021 1547  ? GLUCOSEU NEGATIVE 04/25/2021 1547  ? HGBUR MODERATE (A) 04/25/2021 1547  ? [place] NEGATIVE 04/25/2021 1547  ? [place] NEGATIVE 04/25/2021 1547  ? PROTEINUR 30 (A) 04/25/2021 1547  ? UROBILINOGEN 0.2 05/15/2007 1118  ? NITRITE POSITIVE (A) 04/25/2021 1547  ? LEUKOCYTESUR LARGE (A) 04/25/2021 1547  ? ? ?Results for orders placed or performed during the hospital encounter of 04/25/21  ?Blood Culture (routine x 2)     Status: None  ? Collection Time: 04/25/21  3:47 PM  ? Specimen: Right Antecubital; Blood  ?Result Value Ref Range Status  ? Specimen Description   Final  ?  RIGHT ANTECUBITAL ?Performed at [facility], [address], [place], [place] [address] ?  ? Special Requests   Final  ?  BOTTLES DRAWN AEROBIC AND ANAEROBIC Blood Culture adequate volume ?Performed at [facility], [address], [place], [place] [address] ?  ? Culture   Final  ?  NO GROWTH 5  DAYS ?Performed at [facility], [place] [address], [place], [place] [address] ?  ? Report Status 04/30/2021 FINAL  Final  ?Urine Culture     Status: Abnormal  ? Collection Time: 04/25/21  3:47 PM  ? Specimen: In/Out Cath Urine  ?Result Value Ref Range Status  ? Specimen Description   Final  ?  IN/OUT CATH URINE ?Performed at [facility], [address] [place], [place] [address] ?  ? Special Requests   Final  ?  Normal ?Performed at [facility], [address] [place], [place] [address] ?  ? Culture (A)  Final  ?  >=100,000 COLONIES/mL KLEBSIELLA PNEUMONIAE ?80,000 COLONIES/mL STAPHYLOCOCCUS AUREUS ?  ? Report Status 04/28/2021 FINAL  Final  ? Organism ID, Bacteria KLEBSIELLA PNEUMONIAE (A)  Final  ? Organism ID, Bacteria STAPHYLOCOCCUS AUREUS (A)  Final  ?    Susceptibility  ? Klebsiella pneumoniae - MIC*  ?  AMPICILLIN >=32 RESISTANT Resistant   ?  CEFAZOLIN <=4 SENSITIVE Sensitive   ?  CEFEPIME <=0.12 SENSITIVE Sensitive   ?  CEFTRIAXONE <=0.25 SENSITIVE Sensitive   ?  CIPROFLOXACIN <=0.25 SENSITIVE Sensitive   ?  GENTAMICIN <=1 SENSITIVE Sensitive   ?  IMIPENEM <=0.25 SENSITIVE Sensitive   ?  NITROFURANTOIN 64 INTERMEDIATE Intermediate   ?  TRIMETH/SULFA >=320 RESISTANT Resistant   ?  AMPICILLIN/SULBACTAM 4 SENSITIVE Sensitive   ?  PIP/TAZO <=4 SENSITIVE Sensitive   ?  * >=100,000 COLONIES/mL KLEBSIELLA PNEUMONIAE  ? Staphylococcus aureus - MIC*  ?  CIPROFLOXACIN <=0.5 SENSITIVE Sensitive   ?  GENTAMICIN <=0.5 SENSITIVE Sensitive   ?  NITROFURANTOIN <=16 SENSITIVE Sensitive   ?  OXACILLIN 0.5 SENSITIVE Sensitive   ?  TETRACYCLINE >=16 RESISTANT Resistant   ?  VANCOMYCIN 1 SENSITIVE Sensitive   ?  TRIMETH/SULFA  <=10 SENSITIVE Sensitive   ?  CLINDAMYCIN <=0.25 SENSITIVE Sensitive   ?  RIFAMPIN <=0.5 SENSITIVE Sensitive   ?  Inducible Clindamycin NEGATIVE Sensitive   ?  * 80,000 COLONIES/mL STAPHYLOCOCCUS AUREUS  ?Resp Panel by

## 2021-07-03 NOTE — ED Provider Notes (Signed)
?[facility] DEPT ?Provider Note ? ? ?CSN: [id number] ?Arrival date & time: 07/03/21  1302 ? ?  ? ?History ? ?Chief Complaint  ?Patient presents with  ? Hypertension  ? ? ?Andre Casey. is a 88 y.o. male with medical history of hypertension, memory impairment, prostate cancer status post prostatectomy, CAD.  Per chart review patient has history of urinary incontinence and difficulty with bladder emptying, has Foley catheter in place and seeing monthly for catheter changes.  Patient had recent area where penile prosthesis was exiting the urethra alongside his Foley catheter.  Patient was brought to the operating room for removal of penile prosthesis and had placement of a suprapubic catheter.  ? ?Patient is alert to person, place, and time.  Patient is unsure what brought him to the emergency department.  Per RN note was brought from home via EMS due to concern for possible urinary tract infection.  And hypertension.  Family reported that patient has not been taking his blood pressure medication. ? ?Patient denies any pain or discomfort, fever, chills, abdominal pain, nausea, vomiting,.   ? ? ? ? ?Hypertension ?Pertinent negatives include no chest pain, no abdominal pain, no headaches and no shortness of breath.  ? ?  ? ?Home Medications ?Prior to Admission medications   ?Medication Sig Start Date End Date Taking? Authorizing Provider  ?acetaminophen (TYLENOL) 500 MG tablet Take 1,000 mg by mouth every 8 (eight) hours as needed for mild pain or headache.    [provider]  ?Ascorbic Acid (VITAMIN C PO) Take 1 tablet by mouth every morning.    [provider]  ?aspirin EC 81 MG tablet Take 1 tablet (81 mg total) by mouth daily. Swallow whole. 03/17/21   Antonieta Pert, MD  ?carvedilol (COREG) 6.25 MG tablet Take 1 tablet (6.25 mg total) by mouth 2 (two) times daily with a meal. ?Patient not taking: Reported on 04/25/2021 04/30/16   Lelon Perla, MD  ?MAGNESIUM PO  Take 1 tablet by mouth See admin instructions. Take one tablet by mouth every Monday, Wednesday, Friday night    [provider]  ?Melatonin 3 MG CAPS Take 1 capsule by mouth at bedtime.    [provider]  ?memantine (NAMENDA) 10 MG tablet Take 1 tablet (10 mg total) by mouth 2 (two) times daily. 04/08/21   Marcial Pacas, MD  ?Multiple Vitamins-Minerals (PRESERVISION AREDS 2) CAPS Take 1 capsule by mouth 2 (two) times daily.    [provider]  ?Probiotic Product (PROBIOTIC PO) Take 1 tablet by mouth every morning.    [provider]  ?senna-docusate (SENOKOT-S) 8.6-50 MG tablet Take 1 tablet by mouth at bedtime as needed for mild constipation. 04/28/21   Mercy Riding, MD  ?traMADol (ULTRAM) 50 MG tablet Take 1 tablet (50 mg total) by mouth every 6 (six) hours as needed. 04/29/20   Veryl Speak, MD  ?   ? ?Allergies    ?Donepezil and Namzaric [memantine hcl-donepezil hcl]   ? ?Review of Systems   ?Review of Systems  ?Constitutional:  Negative for chills and fever.  ?Eyes:  Negative for visual disturbance.  ?Respiratory:  Negative for shortness of breath.   ?Cardiovascular:  Negative for chest pain.  ?Gastrointestinal:  Negative for abdominal pain, diarrhea, nausea and vomiting.  ?Genitourinary:  Negative for difficulty urinating, hematuria, penile discharge, penile pain, penile swelling and scrotal swelling.  ?Musculoskeletal:  Negative for back pain and neck pain.  ?Skin:  Negative for color change  and rash.  ?Neurological:  Negative for dizziness, syncope, light-headedness and headaches.  ?Psychiatric/Behavioral:  Negative for confusion.   ? ?Physical Exam ?Updated Vital Signs ?BP (!) 158/84 (BP Location: Left Arm)   Pulse 95   Temp 100.3 ?F (37.9 ?C) (Oral)   Resp 19   SpO2 96%  ?Physical Exam ?Vitals and nursing note reviewed. Exam conducted with a chaperone present (Male RN present).  ?Constitutional:   ?   General: He is not in acute distress. ?   Appearance: He is not  ill-appearing, toxic-appearing or diaphoretic.  ?HENT:  ?   Head: Normocephalic.  ?Eyes:  ?   General: No scleral icterus.    ?   Right eye: No discharge.     ?   Left eye: No discharge.  ?Cardiovascular:  ?   Rate and Rhythm: Normal rate.  ?Pulmonary:  ?   Effort: Pulmonary effort is normal.  ?Abdominal:  ?   General: Abdomen is flat. There is no distension. There are no signs of injury.  ?   Palpations: Abdomen is soft. There is no mass or pulsatile mass.  ?   Tenderness: There is no abdominal tenderness. There is no guarding or rebound.  ?   Hernia: There is no hernia in the umbilical area or ventral area.  ?   Comments: Suprapubic catheter to lower abdomen.  Discharge and erythema noted around catheter site.  ?Genitourinary: ?   Penis: Uncircumcised. No phimosis, paraphimosis, hypospadias, erythema, tenderness, discharge, swelling or lesions.   ?   Testes: Cremasteric reflex is present.     ?   Right: Mass, tenderness, swelling, testicular hydrocele or varicocele not present. Right testis is descended. Cremasteric reflex is present.      ?   Left: Mass, tenderness, swelling, testicular hydrocele or varicocele not present. Left testis is descended. Cremasteric reflex is present.   ?   Epididymis:  ?   Right: Normal.  ?   Left: Normal.  ?   Tanner stage (genital): 5.  ?   Comments: No erythema, warmth, or wounds to patient's scrotum ?Skin: ?   General: Skin is warm and dry.  ?Neurological:  ?   General: No focal deficit present.  ?   Mental Status: He is alert.  ?Psychiatric:     ?   Behavior: Behavior is cooperative.  ? ? ? ? ?ED Results / Procedures / Treatments   ?Labs ?(all labs ordered are listed, but only abnormal results are displayed) ?Labs Reviewed  ?LACTIC ACID, PLASMA - Abnormal; Notable for the following components:  ?    Result Value  ? Lactic Acid, Venous 2.4 (*)   ? All other components within normal limits  ?COMPREHENSIVE METABOLIC PANEL - Abnormal; Notable for the following components:  ?  Potassium 3.4 (*)   ? Glucose, Bld 111 (*)   ? Calcium 8.8 (*)   ? Albumin 3.3 (*)   ? All other components within normal limits  ?CBC WITH DIFFERENTIAL/PLATELET - Abnormal; Notable for the following components:  ? WBC 10.7 (*)   ? Hemoglobin 12.0 (*)   ? HCT 36.9 (*)   ? All other components within normal limits  ?URINE CULTURE  ?CULTURE, BLOOD (ROUTINE X 2)  ?CULTURE, BLOOD (ROUTINE X 2)  ?LACTIC ACID, PLASMA  ?URINALYSIS, ROUTINE W REFLEX MICROSCOPIC  ? ? ?EKG ?None ? ?Radiology ?CT ABDOMEN PELVIS W CONTRAST ? ?Result Date: 07/03/2021 ?CLINICAL DATA:  Intra-abdominal abscess Concern for infection around suprapubic catheter Abdominal pain. EXAM:  CT ABDOMEN AND PELVIS WITH CONTRAST TECHNIQUE: Multidetector CT imaging of the abdomen and pelvis was performed using the standard protocol following bolus administration of intravenous contrast. RADIATION DOSE REDUCTION: This exam was performed according to the departmental dose-optimization program which includes automated exposure control, adjustment of the mA and/or kV according to patient size and/or use of iterative reconstruction technique. CONTRAST:  75m OMNIPAQUE IOHEXOL 300 MG/ML  SOLN COMPARISON:  CT 04/25/2021, additional priors.  MRI 02/15/2019 FINDINGS: Lower chest: Emphysema. No focal airspace disease or pleural effusion. Tiny hiatal hernia. Hepatobiliary: No focal liver abnormality is seen. No gallstones, gallbladder wall thickening, or biliary dilatation. Pancreas: Again seen 13 mm hypodense lesion in the pancreatic head, stable from prior exams, including 02/15/2019 MRI. Stable mild pancreatic ductal dilatation. Mild parenchymal atrophy. No acute inflammation. Spleen: Normal in size without focal abnormality. Adrenals/Urinary Tract: Normal adrenal glands solid enhancing renal mass in the lateral right kidney measures 2.5 cm, series 2, image 29, 2.2 cm on 2020 MRI. There are tiny cortical hypodensities in the kidneys are too small to characterize, likely  small cysts. No hydronephrosis. No significant perinephric edema. Homogeneous enhancement with symmetric excretion on delayed phase imaging. Suprapubic tube decompresses the urinary bladder. There is soft tissue thicke

## 2021-07-03 NOTE — Assessment & Plan Note (Signed)
Monitor for any sundowning 

## 2021-07-03 NOTE — Progress Notes (Signed)
Pharmacy Antibiotic Note ? ?Andre Casey. is a 88 y.o. male admitted on 07/03/2021 with UTI, cellulitis around suprapubic catheter site.  Pharmacy has been consulted for Vancomycin and Cefepime dosing. ? ?Plan: ?Cefepime 2g IV q12h  ?Vancomycin 1g x1 in ED, followed by 750 mg IV q24h.  (SCr 1, est AUC 402) ?Measure Vanc levels prn.  Goal AUC = 400 - 550. ?Follow up renal function, culture results, and clinical course. ? ? ?  ? ?Temp (24hrs), Avg:99.4 ?F (37.4 ?C), Min:97.8 ?F (36.6 ?C), Max:101.7 ?F (38.7 ?C) ? ?Recent Labs  ?Lab 07/03/21 ?1355 07/03/21 ?1555  ?WBC 10.7*  --   ?CREATININE 1.00  --   ?LATICACIDVEN 2.4* 1.6  ?  ?CrCl cannot be calculated (Unknown ideal weight.).   ?Last documented weight 58 kg, CrCl ~ 40 ml/min.  ? ?Allergies  ?Allergen Reactions  ? Donepezil Nausea Only  ?  Report upset stomach - unable to tolerate.  ? Namzaric [Memantine Hcl-Donepezil Hcl] Other (See Comments)  ?  Stomach upset - pt would like to try lower doses of medications in separate prescriptions (note in Epic). 03/12/21 - pt is currently taking memantine  ? ? ?Antimicrobials this admission: ?5/10 Cefepime >>  ?5/10 Vancomycin >>  ? ?Dose adjustments this admission: ? ? ?Microbiology results: ?5/10 Bcx: ?5/10 Ucx:  ? ?Thank you for allowing pharmacy to be a part of this patient?s care. ? ?Gretta Arab PharmD, BCPS ?Clinical Pharmacist ?[facility] [phone] ?07/03/2021 9:23 PM ? ? ?

## 2021-07-04 DIAGNOSIS — E876 Hypokalemia: Secondary | ICD-10-CM | POA: Diagnosis present

## 2021-07-04 DIAGNOSIS — Z8249 Family history of ischemic heart disease and other diseases of the circulatory system: Secondary | ICD-10-CM | POA: Diagnosis not present

## 2021-07-04 DIAGNOSIS — Z823 Family history of stroke: Secondary | ICD-10-CM | POA: Diagnosis not present

## 2021-07-04 DIAGNOSIS — Z515 Encounter for palliative care: Secondary | ICD-10-CM

## 2021-07-04 DIAGNOSIS — T83511A Infection and inflammatory reaction due to indwelling urethral catheter, initial encounter: Secondary | ICD-10-CM | POA: Diagnosis present

## 2021-07-04 DIAGNOSIS — Z7189 Other specified counseling: Secondary | ICD-10-CM

## 2021-07-04 DIAGNOSIS — F0280 Dementia in other diseases classified elsewhere without behavioral disturbance: Secondary | ICD-10-CM | POA: Diagnosis present

## 2021-07-04 DIAGNOSIS — L03119 Cellulitis of unspecified part of limb: Secondary | ICD-10-CM | POA: Diagnosis present

## 2021-07-04 DIAGNOSIS — Z8616 Personal history of COVID-19: Secondary | ICD-10-CM | POA: Diagnosis not present

## 2021-07-04 DIAGNOSIS — I25119 Atherosclerotic heart disease of native coronary artery with unspecified angina pectoris: Secondary | ICD-10-CM | POA: Diagnosis present

## 2021-07-04 DIAGNOSIS — Z87891 Personal history of nicotine dependence: Secondary | ICD-10-CM | POA: Diagnosis not present

## 2021-07-04 DIAGNOSIS — M25531 Pain in right wrist: Secondary | ICD-10-CM

## 2021-07-04 DIAGNOSIS — Z9079 Acquired absence of other genital organ(s): Secondary | ICD-10-CM | POA: Diagnosis not present

## 2021-07-04 DIAGNOSIS — E7800 Pure hypercholesterolemia, unspecified: Secondary | ICD-10-CM | POA: Diagnosis present

## 2021-07-04 DIAGNOSIS — Z66 Do not resuscitate: Secondary | ICD-10-CM | POA: Diagnosis present

## 2021-07-04 DIAGNOSIS — Y846 Urinary catheterization as the cause of abnormal reaction of the patient, or of later complication, without mention of misadventure at the time of the procedure: Secondary | ICD-10-CM | POA: Diagnosis present

## 2021-07-04 DIAGNOSIS — E43 Unspecified severe protein-calorie malnutrition: Secondary | ICD-10-CM | POA: Diagnosis present

## 2021-07-04 DIAGNOSIS — I10 Essential (primary) hypertension: Secondary | ICD-10-CM | POA: Diagnosis present

## 2021-07-04 DIAGNOSIS — C7951 Secondary malignant neoplasm of bone: Secondary | ICD-10-CM | POA: Diagnosis present

## 2021-07-04 DIAGNOSIS — G301 Alzheimer's disease with late onset: Secondary | ICD-10-CM | POA: Diagnosis present

## 2021-07-04 DIAGNOSIS — Z96611 Presence of right artificial shoulder joint: Secondary | ICD-10-CM | POA: Diagnosis present

## 2021-07-04 DIAGNOSIS — Z955 Presence of coronary angioplasty implant and graft: Secondary | ICD-10-CM | POA: Diagnosis not present

## 2021-07-04 DIAGNOSIS — Z79899 Other long term (current) drug therapy: Secondary | ICD-10-CM | POA: Diagnosis not present

## 2021-07-04 DIAGNOSIS — L03113 Cellulitis of right upper limb: Secondary | ICD-10-CM | POA: Diagnosis present

## 2021-07-04 DIAGNOSIS — Z85828 Personal history of other malignant neoplasm of skin: Secondary | ICD-10-CM | POA: Diagnosis not present

## 2021-07-04 DIAGNOSIS — N390 Urinary tract infection, site not specified: Secondary | ICD-10-CM

## 2021-07-04 DIAGNOSIS — Z825 Family history of asthma and other chronic lower respiratory diseases: Secondary | ICD-10-CM | POA: Diagnosis not present

## 2021-07-04 DIAGNOSIS — R5381 Other malaise: Secondary | ICD-10-CM | POA: Diagnosis not present

## 2021-07-04 DIAGNOSIS — C61 Malignant neoplasm of prostate: Secondary | ICD-10-CM | POA: Diagnosis present

## 2021-07-04 DIAGNOSIS — T83510A Infection and inflammatory reaction due to cystostomy catheter, initial encounter: Secondary | ICD-10-CM | POA: Diagnosis not present

## 2021-07-04 DIAGNOSIS — N35919 Unspecified urethral stricture, male, unspecified site: Secondary | ICD-10-CM | POA: Diagnosis not present

## 2021-07-04 DIAGNOSIS — Z923 Personal history of irradiation: Secondary | ICD-10-CM | POA: Diagnosis not present

## 2021-07-04 LAB — CBC
HCT: 33.6 % — ABNORMAL LOW (ref 39.0–52.0)
Hemoglobin: 10.7 g/dL — ABNORMAL LOW (ref 13.0–17.0)
MCH: 28 pg (ref 26.0–34.0)
MCHC: 31.8 g/dL (ref 30.0–36.0)
MCV: 88 fL (ref 80.0–100.0)
Platelets: 336 10*3/uL (ref 150–400)
RBC: 3.82 MIL/uL — ABNORMAL LOW (ref 4.22–5.81)
RDW: 15.1 % (ref 11.5–15.5)
WBC: 7.8 10*3/uL (ref 4.0–10.5)
nRBC: 0 % (ref 0.0–0.2)

## 2021-07-04 LAB — COMPREHENSIVE METABOLIC PANEL
ALT: 9 U/L (ref 0–44)
AST: 16 U/L (ref 15–41)
Albumin: 2.8 g/dL — ABNORMAL LOW (ref 3.5–5.0)
Alkaline Phosphatase: 60 U/L (ref 38–126)
Anion gap: 7 (ref 5–15)
BUN: 11 mg/dL (ref 8–23)
CO2: 23 mmol/L (ref 22–32)
Calcium: 8.4 mg/dL — ABNORMAL LOW (ref 8.9–10.3)
Chloride: 109 mmol/L (ref 98–111)
Creatinine, Ser: 0.75 mg/dL (ref 0.61–1.24)
GFR, Estimated: >60 mL/min (ref >60)
Glucose, Bld: 117 mg/dL — ABNORMAL HIGH (ref 70–99)
Potassium: 3.2 mmol/L — ABNORMAL LOW (ref 3.5–5.1)
Sodium: 139 mmol/L (ref 135–145)
Total Bilirubin: 0.9 mg/dL (ref 0.3–1.2)
Total Protein: 5.8 g/dL — ABNORMAL LOW (ref 6.5–8.1)

## 2021-07-04 LAB — URINALYSIS, COMPLETE (UACMP) WITH MICROSCOPIC
Bilirubin Urine: NEGATIVE
Glucose, UA: NEGATIVE mg/dL
Hgb urine dipstick: NEGATIVE
Ketones, ur: 5 mg/dL — AB
Nitrite: POSITIVE — AB
Protein, ur: NEGATIVE mg/dL
Specific Gravity, Urine: 1.014 (ref 1.005–1.030)
pH: 7 (ref 5.0–8.0)

## 2021-07-04 LAB — PREALBUMIN: Prealbumin: 12.6 mg/dL — ABNORMAL LOW (ref 18–38)

## 2021-07-04 MED ORDER — HYDRALAZINE HCL 25 MG PO TABS: 25.0000 mg | ORAL_TABLET | ORAL | Status: DC | PRN

## 2021-07-04 MED ORDER — AMLODIPINE BESYLATE 5 MG PO TABS: 2.5000 mg | ORAL_TABLET | Freq: Every day | ORAL | Status: DC

## 2021-07-04 MED ORDER — CARVEDILOL 6.25 MG PO TABS
6.2500 mg | ORAL_TABLET | Freq: Two times a day (BID) | ORAL | Status: DC
  Administered 2021-07-04 – 2021-07-10 (×12): 6.25 mg via ORAL
  Filled 2021-07-04 (×13): qty 1

## 2021-07-04 MED ORDER — LABETALOL HCL 5 MG/ML IV SOLN
10.0000 mg | INTRAVENOUS | Status: DC | PRN
Start: 2021-07-04 — End: 2021-07-11

## 2021-07-04 MED ORDER — AMLODIPINE BESYLATE 5 MG PO TABS
2.5000 mg | ORAL_TABLET | Freq: Every day | ORAL | Status: DC
  Administered 2021-07-04 – 2021-07-10 (×7): 2.5 mg via ORAL
  Filled 2021-07-04 (×7): qty 1

## 2021-07-04 MED ORDER — MIRTAZAPINE 15 MG PO TABS
7.5000 mg | ORAL_TABLET | Freq: Every day | ORAL | Status: DC
  Administered 2021-07-04 – 2021-07-10 (×7): 7.5 mg via ORAL
  Filled 2021-07-04 (×7): qty 1

## 2021-07-04 MED ORDER — CARVEDILOL 6.25 MG PO TABS: 6.2500 mg | ORAL_TABLET | Freq: Two times a day (BID) | ORAL | Status: DC

## 2021-07-04 NOTE — Assessment & Plan Note (Addendum)
At baseline 
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
## 2021-07-04 NOTE — Assessment & Plan Note (Addendum)
-   Continue suprapubic Foley; exchanging on 5/12 ?- flush foley as needed ?

## 2021-07-04 NOTE — Hospital Course (Addendum)
Mr. Heidenreich is an 88 yo male with PMH CAD, HTN, HLD, arthritis, metastatic prostate cancer, right renal mass/pancreatic mass, physical deconditioning, chronic suprapubic catheter who presented with altered mentation and elevated blood pressure. ? ?There was concern for UTI and he was started on antibiotics and admitted for further work-up.  He also had been wearing Chelcea Zahn splint around his right wrist due to swelling and pain.  His urine culture was insignificant growth, but he was treated empirically with 5 days of cefepime.  For his right wrist swelling, he was seen by orthopedics and his right wrist was aspirated.  His wrist culture is no growth, but no cell count or crystal analysis done.  His wrist tenderness has gradually improved.  Recommending outpatient follow up with orthopedics as well as urology. ? ?See below for additional details ?

## 2021-07-04 NOTE — Assessment & Plan Note (Addendum)
-   Afebrile with very mild leukocytosis on admission (10.7).  He has been wearing Akima Slaugh splint due to pain and difficulty with ROM.  Ultrasound performed shows severe synovitis with effusion and complex fluid collection measuring 3.4 x 1.7 x 2.1 cm. This is favored to be inflammatory arthritis/CPPD arthropathy (infection thought less likely). Uric acid is 4.9.  ?- 5/10 plain film with widened scapholunate interval with SLAC configuration of wrist ?-WBC normalized after admission ?-Okay to DC vancomycin ?-Aspiration performed on 07/05/2021 yielding 1 cc straw-colored fluid.   ?- culture no growth, no organisms seen ?- no crystal analysis or cell count done ?- needs outpatient orthopedics follow up with Dr. Ambrose Finland  ?- seen by ortho, didn't think c/w septic arthritis or tenosynovitis, splint for comfort, recommended aspiration by IR ?

## 2021-07-04 NOTE — Assessment & Plan Note (Addendum)
-   Followed by urology at [facility] ?- CT with new sclerotic focus within posterior L4 vertebra concerning for metastatic disease, also 2.5 cm right renal mass concerning for RCC, stable pancreatic head hypodensity ?- Appreciate palliative care evaluation ?

## 2021-07-04 NOTE — Progress Notes (Signed)
SATURATION QUALIFICATIONS: (This note is used to comply with regulatory documentation for home oxygen)  Patient Saturations on Room Air at Rest = 95%  Patient Saturations on Room Air while Ambulating = 94%  Patient Saturations on 0 Liters of oxygen while Ambulating = 94%  Please briefly explain why patient needs home oxygen: 

## 2021-07-04 NOTE — Assessment & Plan Note (Signed)
-   noted and followed by oncology  ?
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
## 2021-07-04 NOTE — Assessment & Plan Note (Signed)
-   Replete as needed 

## 2021-07-04 NOTE — Assessment & Plan Note (Signed)
Possible cellulitis of right hand covered with antibiotics for tonight broad-spectrum ?Plain imaging showing no evidence of free air ?Discussed with hand surgery Dr. Apolonio Schneiders who recommended ultrasound to evaluate for any presence of an abscess and apply splint.  Also suggest the possibility of gout we will check uric acid.  If there is any evidence of drainable infection please reconsult hand surgery.  Patient may follow-up as an outpatient ?

## 2021-07-04 NOTE — Evaluation (Signed)
Occupational Therapy Evaluation ?Patient Details ?Name: Andre Casey. ?MRN: [id number] ?DOB: March 07, 1929 ?Today's Date: 07/04/2021 ? ? ?History of Present Illness Andre Casey. is a 88 y.o. male with medical history significant of metastatic prostate cancer, right renal mass/pancreatic mass, physical deconditioning, urinary incontinence with indwelling suprapubic catheter, penile prosthesis implant with recent removal due to urinary retention on 05/16/21) CKD, Alzheimers, HTN, recent Covid 19 infection (04/15/21), normocytic anemia and CAD.  ? ?Clinical Impression ?  ?Andre Casey is an 88 year old man that lives alone and is typically independent. He reports he still drives and does his grocery shopping. He has a granddaughter in [employer] that checks on him weekly and goes to the doctor with him. ON evaluation he reports pain in right wrist and is wearing wrist splint on his dominant arm and is limiting functional use of arm. He exhibits generalized weakness and decreased activity tolerance.He demonstrates ability to don socks at edge of bed and perform toileting with min assist. Verbal cues to not use dominant hand so he doesn't soil brace. He is setup for LB bathing on toilet with verbal cues to improve quality of task. He is overall min assist to manage hospital gown and don leg bag. He was able to stand and wash his hands and ambulate in room without a device. He is mostly alert and oriented but does have some memory deficits. He reports his wife "died last year" but appears she died in 05-31-2022. He states "I don't remember" a couple of times. Due to weakness and now decreased functional use of dominant hand and the fact that he lives alone - recommend short term rehab at discharge to maximize functional abilities.  ?   ? ?Recommendations for follow up therapy are one component of a multi-disciplinary discharge planning process, led by the attending physician.  Recommendations may be updated  based on patient status, additional functional criteria and insurance authorization.  ? ?Follow Up Recommendations ? Skilled nursing-short term rehab (<3 hours/day)  ?  ?Assistance Recommended at Discharge Frequent or constant Supervision/Assistance  ?Patient can return home with the following A little help with walking and/or transfers;A little help with bathing/dressing/bathroom;Assistance with cooking/housework;Assist for transportation;Help with stairs or ramp for entrance ? ?  ?Functional Status Assessment ? Patient has had a recent decline in their functional status and demonstrates the ability to make significant improvements in function in a reasonable and predictable amount of time.  ?Equipment Recommendations ? None recommended by OT  ?  ?Recommendations for Other Services   ? ? ?  ?Precautions / Restrictions Precautions ?Precaution Comments: suprapubic catheter ?Required Braces or Orthoses: Splint/Cast ?Splint/Cast: R wrist immobilizer ?Splint/Cast - Date Prophylactic Dressing Applied (if applicable): 82/42/35 ?Restrictions ?Weight Bearing Restrictions: No  ? ?  ? ?Mobility Bed Mobility ?  ?  ?  ?  ?  ?  ?  ?  ?  ? ?Transfers ?  ?  ?  ?  ?  ?  ?  ?  ?  ?  ?  ? ?  ?Balance Overall balance assessment: Mild deficits observed, not formally tested ?  ?  ?  ?  ?  ?  ?  ?  ?  ?  ?  ?  ?  ?  ?  ?  ?  ?  ?   ? ?ADL either performed or assessed with clinical judgement  ? ?ADL Overall ADL's : Needs assistance/impaired ?Eating/Feeding: Set up ?  ?Grooming: Standing;Wash/dry hands ?Grooming  Details (indicate cue type and reason): at sink ?Upper Body Bathing: Set up ?  ?Lower Body Bathing: Minimal assistance;Sit to/from stand ?  ?Upper Body Dressing : Set up ?  ?Lower Body Dressing: Minimal assistance ?Lower Body Dressing Details (indicate cue type and reason): able to don socks, min assist to manage hospital gown, assist with placing leg bag, ?Toilet Transfer: Min guard;Regular Toilet;Grab bars ?  ?Toileting- Clothing  Manipulation and Hygiene: Minimal assistance ?Toileting - Clothing Manipulation Details (indicate cue type and reason): to manage hospital gown, assist with doffing diaper - patient found to be incontinent of BM once diaper removed and on the way to bathroom. ?  ?  ?Functional mobility during ADLs: Min guard ?General ADL Comments: To ambulate in room  ? ? ? ?Vision Baseline Vision/History: 1 Wears glasses (for reading) ?   ?   ?Perception   ?  ?Praxis   ?  ? ?Pertinent Vitals/Pain Pain Assessment ?Pain Assessment: 0-10 ?Pain Score: 5  ?Pain Location: right wrist ?Pain Descriptors / Indicators: Discomfort ?Pain Intervention(s): Limited activity within patient's tolerance, Monitored during session  ? ? ? ?Hand Dominance Right ?  ?Extremity/Trunk Assessment Upper Extremity Assessment ?Upper Extremity Assessment: RUE deficits/detail;LUE deficits/detail ?RUE Deficits / Details: WFL ROM, reports right wrist pain, has wrist splint ?LUE Deficits / Details: WFL ROM and strength ?  ?Lower Extremity Assessment ?Lower Extremity Assessment: Defer to PT evaluation ?  ?Cervical / Trunk Assessment ?Cervical / Trunk Assessment: Kyphotic ?  ?Communication Communication ?Communication: HOH ?  ?Cognition Arousal/Alertness: Awake/alert ?Behavior During Therapy: [facility] for tasks assessed/performed ?Overall Cognitive Status: Within Functional Limits for tasks assessed ?  ?  ?  ?  ?  ?  ?  ?  ?  ?  ?  ?  ?  ?  ?  ?  ?General Comments: alert and oriented to self, year, month ?  ?  ?General Comments    ? ?  ?Exercises   ?  ?Shoulder Instructions    ? ? ?Home Living Family/patient expects to be discharged to:: Private residence ?Living Arrangements: Alone ?Available Help at Discharge: Available PRN/intermittently ?Type of Home: House ?Home Access: Stairs to enter;Ramped entrance ?Entrance Stairs-Number of Steps: 4 ?Entrance Stairs-Rails: Right;Left ?Home Layout: One level ?  ?  ?Bathroom Shower/Tub: Walk-in shower ?  ?Bathroom Toilet: Standard ?   ?  ?Home Equipment: Conservation officer, nature (2 wheels);Cane - single point;Wheelchair - manual;Shower seat ?  ?Additional Comments: has a granddaughter-in-law that checks on him maybe once a week ?  ? ?  ?Prior Functioning/Environment Prior Level of Function : Independent/Modified Independent ?  ?  ?  ?  ?  ?  ?Mobility Comments: no device ?ADLs Comments: independent ?  ? ?  ?  ?OT Problem List: Decreased strength;Decreased range of motion;Decreased activity tolerance;Impaired balance (sitting and/or standing);Decreased knowledge of precautions;Impaired UE functional use;Pain;Decreased knowledge of use of DME or AE ?  ?   ?OT Treatment/Interventions: Self-care/ADL training;Therapeutic exercise;DME and/or AE instruction;Therapeutic activities;Balance training;Patient/family education  ?  ?OT Goals(Current goals can be found in the care plan section) Acute Rehab OT Goals ?Patient Stated Goal: To go home at discharge ?OT Goal Formulation: With patient ?Time For Goal Achievement: 07/18/21 ?Potential to Achieve Goals: Good  ?OT Frequency: Min 2X/week ?  ? ?Co-evaluation   ?  ?  ?  ?  ? ?  ?AM-PAC OT "6 Clicks" Daily Activity     ?Outcome Measure Help from another person eating meals?: A Little ?Help from another person  taking care of personal grooming?: A Little ?Help from another person toileting, which includes using toliet, bedpan, or urinal?: A Little ?Help from another person bathing (including washing, rinsing, drying)?: A Little ?Help from another person to put on and taking off regular upper body clothing?: A Little ?Help from another person to put on and taking off regular lower body clothing?: A Little ?6 Click Score: 18 ?  ?End of Session Nurse Communication: Mobility status ? ?Activity Tolerance: Patient tolerated treatment well ?Patient left: in chair;with call bell/phone within reach;with chair alarm set ? ?OT Visit Diagnosis: Pain;Muscle weakness (generalized) (M62.81) ?Pain - Right/Left: Right ?Pain - part of  body: Hand  ?              ?Time: 1610-9604 ?OT Time Calculation (min): 28 min ?Charges:  OT General Charges ?$OT Visit: 1 Visit ?OT Evaluation ?$OT Eval Low Complexity: 1 Low ?OT Treatments ?$Self Care

## 2021-07-04 NOTE — Evaluation (Addendum)
Physical Therapy Evaluation ?Patient Details ?Name: Andre Casey. ?MRN: [id number] ?DOB: 06-Dec-1929 ?Today's Date: 07/04/2021 ? ? ?   ?Clinical Impression ? Pt is an 88 y.o. male with below HPI. Pt typically lives alone and is independent at baseline. Pt is MIN guard for safety with transfers and ambulation. He ambulated ~20f without use of AD, mild balance deficits observed. Pt is currently wearing R wrist splint on his dominant hand which is limiting some functional use of his arm throughout session. He is alert and oriented but displays some memory deficits. He reports his wife "died last year" but appears she died in M2024/03/18 He states "I don't remember" a couple of times. Due to pt living alone with limited caregiver support, weakness requiring up to Min guard to maintain safety, and decreased functional use of dominant hand - recommend short term rehab at discharge prior to returning home alone to maximize functional abilities. Pt will benefit from continued skilled PT in order to address listed impairments to maximize functional mobility and hopeful progression toward independent PLOF during stay.  ?   ? ?Recommendations for follow up therapy are one component of a multi-disciplinary discharge planning process, led by the attending physician.  Recommendations may be updated based on patient status, additional functional criteria and insurance authorization. ? ? ? ? 07/04/21 1300  ?PT Visit Information  ?Last PT Received On 07/04/21  ?Assistance Needed +1  ?History of Present Illness Andre Casey is a 88y.o. male with medical history significant of metastatic prostate cancer, right renal mass/pancreatic mass, physical deconditioning, urinary incontinence with indwelling suprapubic catheter, penile prosthesis implant with recent removal due to urinary retention on 05/16/21) CKD, Alzheimers, HTN, recent Covid 19 infection (04/15/21), normocytic anemia and CAD.  ?Precautions  ?Precaution  Comments suprapubic catheter  ?Required Braces or Orthoses Splint/Cast  ?Splint/Cast R wrist immobilizer  ?Splint/Cast - Date Prophylactic Dressing Applied (if applicable) 008/65/78 ?Restrictions  ?Weight Bearing Restrictions No  ?Home Living  ?Family/patient expects to be discharged to: Private residence  ?Living Arrangements Alone  ?Available Help at Discharge Available PRN/intermittently;Family  ?Type of Home House  ?Home Access Stairs to enter;Ramped entrance  ?Entrance Stairs-Number of Steps 4  ?Entrance Stairs-Rails Right;Left  ?Home Layout One level  ?Bathroom Shower/Tub Walk-in shower  ?Bathroom Toilet Standard  ?Bathroom Accessibility Yes  ?Home Equipment [employer](2 wheels);Cane - single point;Wheelchair - manual;Shower seat;BSC/3in1;Grab bars - tub/shower;Grab bars - toilet  ?Additional Comments has a granddaughter-in-law that checks on him maybe once or more a week, brings meals for couple days. Eats frozen meals when meals not provided for him. Has neighbor 1 block away that he reports he can call on in emergency, they talk over the phone almost every day.  ?Prior Function  ?Prior Level of Function  Independent/Modified Independent  ?Mobility Comments no device. Reports he had a fall "some time ago"  ?ADLs Comments independent  ?Communication  ?Communication HOH  ?Pain Assessment  ?Pain Assessment Faces  ?Faces Pain Scale 2  ?Pain Location right wrist  ?Pain Descriptors / Indicators Discomfort  ?Pain Intervention(s) Monitored during session  ?Cognition  ?Arousal/Alertness Awake/alert  ?Behavior [facility] tasks assessed/performed  ?Overall Cognitive Status Within Functional Limits for tasks assessed  ?Upper Extremity Assessment  ?Upper Extremity Assessment Defer to OT evaluation  ?RUE Deficits / Details WFL ROM, reports right wrist pain, has wrist splint  ?LUE Deficits / Details WFL ROM and strength  ?Lower Extremity Assessment  ?Lower Extremity Assessment Generalized  weakness   ?Cervical / Trunk Assessment  ?Cervical / Trunk Assessment Kyphotic  ?Bed Mobility  ?Overal bed mobility Needs Assistance  ?Bed Mobility Sit to Supine  ?Sit to supine Supervision  ?General bed mobility comments OOB in recliner upon entry, supervision for safety  ?Transfers  ?Overall transfer level Needs assistance  ?Equipment used None  ?Transfers Sit to/from Stand  ?Sit to Stand Min guard  ?General transfer comment MIN guard for stability upon standing  ?Ambulation/Gait  ?Ambulation/Gait assistance Min guard  ?Gait Distance (Feet) 40 Feet  ?Assistive device None  ?Gait Pattern/deviations Step-through pattern;Decreased stride length  ?General Gait Details slow gait speed, intermittent instances of furniture surfing  ?Gait velocity decr  ?Balance  ?Overall balance assessment Mild deficits observed, not formally tested  ?PT - End of Session  ?Equipment Utilized During Treatment Gait belt  ?Activity Tolerance Patient tolerated treatment well  ?Patient left in bed;with call bell/phone within reach;with nursing/sitter in room  ?Nurse Communication Mobility status  ?PT Assessment  ?PT Recommendation/Assessment Patient needs continued PT services  ?PT Visit Diagnosis Unsteadiness on feet (R26.81);Muscle weakness (generalized) (M62.81)  ?PT Problem List Decreased strength;Decreased activity tolerance;Decreased balance;Decreased mobility  ?PT Plan  ?PT Frequency (ACUTE ONLY) Min 3X/week  ?PT Treatment/Interventions (ACUTE ONLY) Gait training;Stair training;Functional mobility training;Therapeutic activities;Therapeutic exercise;Balance training;Patient/family education  ?AM-PAC PT "6 Clicks" Mobility Outcome Measure (Version 2)  ?Help needed turning from your back to your side while in a flat bed without using bedrails? 3  ?Help needed moving from lying on your back to sitting on the side of a flat bed without using bedrails? 3  ?Help needed moving to and from a bed to a chair (including a wheelchair)? 3  ?Help needed  standing up from a chair using your arms (e.g., wheelchair or bedside chair)? 3  ?Help needed to walk in hospital room? 3  ?Help needed climbing 3-5 steps with a railing?  2  ?6 Click Score 17  ?Consider Recommendation of Discharge To: Home with HH  ?Progressive Mobility  ?What is the highest level of mobility based on the progressive mobility assessment? Level 4 (Walks with assist in room) - Balance while marching in place and cannot step forward and back - Complete  ?Activity Ambulated with assistance in room  ?PT Recommendation  ?Follow Up Recommendations Skilled nursing-short term rehab (<3 hours/day)  ?Assistance recommended at discharge Frequent or constant Supervision/Assistance  ?Patient can return home with the following A little help with walking and/or transfers;A little help with bathing/dressing/bathroom;Assist for transportation;Help with stairs or ramp for entrance;Assistance with cooking/housework  ?Functional Status Assessment Patient has had a recent decline in their functional status and demonstrates the ability to make significant improvements in function in a reasonable and predictable amount of time.  ?PT equipment None recommended by PT  ?Individuals Consulted  ?Consulted and Agree with Results and Recommendations Patient  ?Acute Rehab PT Goals  ?Patient Stated Goal Feel better  ?PT Goal Formulation With patient  ?Time For Goal Achievement 07/18/21  ?Potential to Achieve Goals Good  ?PT Time Calculation  ?PT Start Time (ACUTE ONLY) 1350  ?PT Stop Time (ACUTE ONLY) 1404  ?PT Time Calculation (min) (ACUTE ONLY) 14 min  ?PT General Charges  ?$$ ACUTE PT VISIT 1 Visit  ?PT Evaluation  ?$PT Eval Low Complexity 1 Low  ?Written Expression  ?Dominant Hand Right  ? ?   ? ? ?Festus Barren., PT, DPT  ?Acute Rehabilitation Services  ?Office [phone] ? ?07/04/2021, 5:50 PM ? ?

## 2021-07-04 NOTE — Assessment & Plan Note (Addendum)
-   Patient had reported foul-smelling urine with red tinge.  He was started on antibiotics for empiric coverage. ?- Follow-up urine culture (insignificant growth).  Repeat culture had 50,000 yeast.  He's completed 5 days of cefepime.  Will hold off on treatment of yeast, suspect this is more likely contaminant.  Follow outpatient.   ?-Suprapubic catheter exchanged on 07/05/2021 ?-Patient developed Maylie Ashton fever on 07/07/2021.  He has no leukocytosis and fever has defervesced.  Repeat UA/Cx showed yeast as above. He remained stable/non-toxic with no further fevers and no leukocytosis therefore no further anti infectives initiated after previous course ?

## 2021-07-04 NOTE — Assessment & Plan Note (Signed)
-   Continue aspirin and Coreg ?

## 2021-07-04 NOTE — Progress Notes (Signed)
?Progress Note ? ? ? ?Andre Casey.   ?FHL:[id number]  ?DOB: 1929/08/15  ?DOA: 07/03/2021     0 ?PCP: [name], MD ? ?Initial CC: AMS ? ?Hospital Course: ?Andre Casey is an 88 yo male with PMH CAD, HTN, HLD, arthritis, metastatic prostate cancer, right renal mass/pancreatic mass, physical deconditioning, chronic suprapubic catheter who presented with altered mentation and elevated blood pressure. ?There was concern for UTI and he was started on antibiotics and admitted for further work-up.  He also had been wearing a splint around his right wrist due to swelling and pain. ? ?Interval History:  ?Sitting up in recliner comfortable this morning.  He noted his ongoing pain in his right wrist with his splint in place.  Due to his underlying dementia, he was a poor historian. ? ?Assessment and Plan: ?UTI (urinary tract infection) due to urinary indwelling Foley catheter ([place]) ?- Patient had reported foul-smelling urine with red tinge.  He was started on antibiotics for empiric coverage.  No urine studies available for review at this time ?- Follow-up UA and culture ?-Continue cefepime for now ?- Case was discussed with urology on admission with no recommendation to exchange suprapubic catheter at this time ? ?Right wrist pain ?- Afebrile with very mild leukocytosis on admission (10.7).  He has been wearing a splint due to pain and difficulty with ROM.  Ultrasound performed shows severe synovitis with effusion and complex fluid collection measuring 3.4 x 1.7 x 2.1 cm. This is favored to be inflammatory in nature. Uric acid is 4.9.  ?-Okay to DC vancomycin ?- likely needs continued supportive care and possibly some steroids; holding off until seen by hand surgery ?- follow up hand surgery recommendations as well ? ?Prostate cancer metastatic to multiple sites [facility]) ?- Followed by urology at [facility] ?- Appreciate palliative care evaluation, follow-up consult ? ?Moderate late onset Alzheimer's dementia  ([place]) ?- At baseline.  Patient does not really remember why he came to the hospital ? ?Hypokalemia ?- Replete as needed ? ?Essential hypertension ?- Blood pressure above goal ?- Resuming home medications ? ?Pancreatic mass ?- noted and followed by oncology  ? ?Atherosclerotic heart disease of native coronary artery with angina pectoris ([place]) ?- Continue aspirin and Coreg ? ?Urethral stricture ?- Continue suprapubic Foley ? ? ?Old records reviewed in assessment of this patient ? ?Antimicrobials: ?Vanc 5/10 x 1 ?Cefepime 5/10 >> current ? ? ?DVT prophylaxis:  ?SCDs Start: 07/03/21 2217 ? ? ?Code Status:   Code Status: DNR ? ?Disposition Plan:  Pending PT eval ?Status is: Inpt ? ?Objective: ?Blood pressure (!) 182/97, pulse 85, temperature 98.8 ?F (37.1 ?C), temperature source Oral, resp. rate 18, height '5\' 7"'$  (1.702 m), weight 58 kg, SpO2 100 %.  ?Examination:  ?Physical Exam ?Constitutional:   ?   General: He is not in acute distress. ?HENT:  ?   Head: Normocephalic and atraumatic.  ?   Mouth/Throat:  ?   Mouth: Mucous membranes are moist.  ?Eyes:  ?   Extraocular Movements: Extraocular movements intact.  ?Cardiovascular:  ?   Rate and Rhythm: Normal rate and regular rhythm.  ?   Heart sounds: Normal heart sounds.  ?Pulmonary:  ?   Effort: Pulmonary effort is normal. No respiratory distress.  ?   Breath sounds: Normal breath sounds. No wheezing.  ?Abdominal:  ?   General: Bowel sounds are normal.  ?   Palpations: Abdomen is soft.  ?   Tenderness: There is no abdominal tenderness.  ?  Comments: Suprapubic catheter in place  ?Musculoskeletal:  ?   Cervical back: Normal range of motion and neck supple.  ?   Comments: Right wrist noted with mild edema and notably erythema, calor, and pain with active or passive ROM  ?Skin: ?   General: Skin is warm and dry.  ?Neurological:  ?   Mental Status: He is alert. Mental status is at baseline.  ?   Comments: Follows commands and moves all 4 extremities easily  ?Psychiatric:      ?   Mood and Affect: Mood normal.  ?  ? ?Consultants:  ?Palliative care ?Hand surgery ? ?Procedures:  ? ? ?Data Reviewed: ?Results for orders placed or performed during the hospital encounter of 07/03/21 (from the past 24 hour(s))  ?Lactic acid, plasma     Status: Abnormal  ? Collection Time: 07/03/21  1:55 PM  ?Result Value Ref Range  ? Lactic Acid, Venous 2.4 (HH) 0.5 - 1.9 mmol/L  ?Comprehensive metabolic panel     Status: Abnormal  ? Collection Time: 07/03/21  1:55 PM  ?Result Value Ref Range  ? Sodium 136 135 - 145 mmol/L  ? Potassium 3.4 (L) 3.5 - 5.1 mmol/L  ? Chloride 103 98 - 111 mmol/L  ? CO2 23 22 - 32 mmol/L  ? Glucose, Bld 111 (H) 70 - 99 mg/dL  ? BUN 15 8 - 23 mg/dL  ? Creatinine, Ser 1.00 0.61 - 1.24 mg/dL  ? Calcium 8.8 (L) 8.9 - 10.3 mg/dL  ? Total Protein 6.9 6.5 - 8.1 g/dL  ? Albumin 3.3 (L) 3.5 - 5.0 g/dL  ? AST 21 15 - 41 U/L  ? ALT 12 0 - 44 U/L  ? Alkaline Phosphatase 70 38 - 126 U/L  ? Total Bilirubin 0.8 0.3 - 1.2 mg/dL  ? GFR, Estimated >60 >60 mL/min  ? Anion gap 10 5 - 15  ?CBC with Differential     Status: Abnormal  ? Collection Time: 07/03/21  1:55 PM  ?Result Value Ref Range  ? WBC 10.7 (H) 4.0 - 10.5 K/uL  ? RBC 4.27 4.22 - 5.81 MIL/uL  ? Hemoglobin 12.0 (L) 13.0 - 17.0 g/dL  ? HCT 36.9 (L) 39.0 - 52.0 %  ? MCV 86.4 80.0 - 100.0 fL  ? MCH 28.1 26.0 - 34.0 pg  ? MCHC 32.5 30.0 - 36.0 g/dL  ? RDW 15.0 11.5 - 15.5 %  ? Platelets 378 150 - 400 K/uL  ? nRBC 0.0 0.0 - 0.2 %  ? Neutrophils Relative % 68 %  ? Neutro Abs 7.4 1.7 - 7.7 K/uL  ? Lymphocytes Relative 20 %  ? Lymphs Abs 2.1 0.7 - 4.0 K/uL  ? Monocytes Relative 9 %  ? Monocytes Absolute 0.9 0.1 - 1.0 K/uL  ? Eosinophils Relative 1 %  ? Eosinophils Absolute 0.1 0.0 - 0.5 K/uL  ? Basophils Relative 1 %  ? Basophils Absolute 0.1 0.0 - 0.1 K/uL  ? Immature Granulocytes 1 %  ? Abs Immature Granulocytes 0.06 0.00 - 0.07 K/uL  ?Blood culture (routine x 2)     Status: None (Preliminary result)  ? Collection Time: 07/03/21  2:19 PM  ?  Specimen: BLOOD  ?Result Value Ref Range  ? Specimen Description    ?  BLOOD RAC ?Performed at [facility], [place] [address], [place], [place] [address] ?  ? Special Requests    ?  BOTTLES DRAWN AEROBIC AND ANAEROBIC Blood Culture results may not be optimal  due to an excessive volume of blood received in culture bottles ?Performed at [facility], [place] [address] [place], [place] [address] ?  ? Culture    ?  NO GROWTH < 24 HOURS ?Performed at [facility], [place] [address] [place], [place] [address] ?  ? Report Status PENDING   ?Blood culture (routine x 2)     Status: None (Preliminary result)  ? Collection Time: 07/03/21  2:24 PM  ? Specimen: BLOOD  ?Result Value Ref Range  ? Specimen Description    ?  BLOOD LW ?Performed at [facility], [place] [address] [place], [place] [address] ?  ? Special Requests    ?  BOTTLES DRAWN AEROBIC AND ANAEROBIC Blood Culture adequate volume ?Performed at [facility], [place] [address] [place], [place] [address] ?  ? Culture    ?  NO GROWTH < 24 HOURS ?Performed at [facility], [place] [address] [place], [place] [address] ?  ? Report Status PENDING   ?Lactic acid, plasma     Status: None  ? Collection Time: 07/03/21  3:55 PM  ?Result Value Ref Range  ? Lactic Acid, Venous 1.6 0.5 - 1.9 mmol/L  ?CK     Status: Abnormal  ? Collection Time: 07/03/21  8:43 PM  ?Result Value Ref Range  ? Total CK 46 (L) 49 - 397 U/L  ?Hepatic function panel     Status: Abnormal  ? Collection Time: 07/03/21  8:43 PM  ?Result Value Ref Range  ? Total Protein 6.2 (L) 6.5 - 8.1 g/dL  ? Albumin 3.0 (L) 3.5 - 5.0 g/dL  ? AST 18 15 - 41 U/L  ? ALT 9 0 - 44 U/L  ? Alkaline Phosphatase 61 38 - 126 U/L  ? Total Bilirubin 0.9 0.3 - 1.2 mg/dL  ? Bilirubin, Direct 0.1 0.0 - 0.2 mg/dL  ? Indirect Bilirubin 0.8 0.3 - 0.9 mg/dL  ?Magnesium     Status: None  ? Collection Time: 07/03/21  8:43 PM  ?Result Value Ref Range  ? Magnesium 2.0 1.7  - 2.4 mg/dL  ?Phosphorus     Status: Abnormal  ? Collection Time: 07/03/21  8:43 PM  ?Result Value Ref Range  ? Phosphorus 2.4 (L) 2.5 - 4.6 mg/dL  ?Uric acid     Status: None  ? Collection Time: 07/03/21  8:43

## 2021-07-04 NOTE — Assessment & Plan Note (Addendum)
-   Continue home meds °

## 2021-07-05 ENCOUNTER — Inpatient Hospital Stay (HOSPITAL_COMMUNITY): Payer: Medicare HMO

## 2021-07-05 DIAGNOSIS — T83511A Infection and inflammatory reaction due to indwelling urethral catheter, initial encounter: Secondary | ICD-10-CM | POA: Diagnosis not present

## 2021-07-05 DIAGNOSIS — M25531 Pain in right wrist: Secondary | ICD-10-CM | POA: Diagnosis not present

## 2021-07-05 DIAGNOSIS — N390 Urinary tract infection, site not specified: Secondary | ICD-10-CM | POA: Diagnosis not present

## 2021-07-05 HISTORY — PX: IR US GUIDE BX ASP/DRAIN: IMG2392

## 2021-07-05 LAB — CBC WITH DIFFERENTIAL/PLATELET
Abs Immature Granulocytes: 0.05 10*3/uL (ref 0.00–0.07)
Basophils Absolute: 0 10*3/uL (ref 0.0–0.1)
Basophils Relative: 0 %
Eosinophils Absolute: 0.4 10*3/uL (ref 0.0–0.5)
Eosinophils Relative: 5 %
HCT: 32.7 % — ABNORMAL LOW (ref 39.0–52.0)
Hemoglobin: 10 g/dL — ABNORMAL LOW (ref 13.0–17.0)
Immature Granulocytes: 1 %
Lymphocytes Relative: 30 %
Lymphs Abs: 2.3 10*3/uL (ref 0.7–4.0)
MCH: 27.1 pg (ref 26.0–34.0)
MCHC: 30.6 g/dL (ref 30.0–36.0)
MCV: 88.6 fL (ref 80.0–100.0)
Monocytes Absolute: 1.1 10*3/uL — ABNORMAL HIGH (ref 0.1–1.0)
Monocytes Relative: 15 %
Neutro Abs: 3.9 10*3/uL (ref 1.7–7.7)
Neutrophils Relative %: 49 %
Platelets: 314 10*3/uL (ref 150–400)
RBC: 3.69 MIL/uL — ABNORMAL LOW (ref 4.22–5.81)
RDW: 14.9 % (ref 11.5–15.5)
WBC: 7.8 10*3/uL (ref 4.0–10.5)
nRBC: 0 % (ref 0.0–0.2)

## 2021-07-05 LAB — BASIC METABOLIC PANEL
Anion gap: 7 (ref 5–15)
BUN: 14 mg/dL (ref 8–23)
CO2: 24 mmol/L (ref 22–32)
Calcium: 8.4 mg/dL — ABNORMAL LOW (ref 8.9–10.3)
Chloride: 106 mmol/L (ref 98–111)
Creatinine, Ser: 1.02 mg/dL (ref 0.61–1.24)
GFR, Estimated: >60 mL/min (ref >60)
Glucose, Bld: 110 mg/dL — ABNORMAL HIGH (ref 70–99)
Potassium: 3.2 mmol/L — ABNORMAL LOW (ref 3.5–5.1)
Sodium: 137 mmol/L (ref 135–145)

## 2021-07-05 LAB — GLUCOSE, CAPILLARY: Glucose-Capillary: 155 mg/dL — ABNORMAL HIGH (ref 70–99)

## 2021-07-05 LAB — MAGNESIUM: Magnesium: 2.2 mg/dL (ref 1.7–2.4)

## 2021-07-05 LAB — URINE CULTURE: Culture: <10000 — AB

## 2021-07-05 MED ORDER — LIDOCAINE HCL 1 % IJ SOLN
INTRAMUSCULAR | Status: AC
  Filled 2021-07-05: qty 20

## 2021-07-05 MED ORDER — LIDOCAINE HCL 1 % IJ SOLN
INTRAMUSCULAR | Status: DC | PRN
  Administered 2021-07-05: 10 mL

## 2021-07-05 MED ORDER — ADULT MULTIVITAMIN W/MINERALS CH
1.0000 | ORAL_TABLET | Freq: Every day | ORAL | Status: DC
  Administered 2021-07-05 – 2021-07-10 (×6): 1 via ORAL
  Filled 2021-07-05 (×6): qty 1

## 2021-07-05 MED ORDER — ENSURE ENLIVE PO LIQD
237.0000 mL | Freq: Three times a day (TID) | ORAL | Status: DC
  Administered 2021-07-05 – 2021-07-10 (×15): 237 mL via ORAL

## 2021-07-05 NOTE — Progress Notes (Signed)
Initial Nutrition Assessment ? ?DOCUMENTATION CODES:  ? ?Severe malnutrition in context of chronic illness ? ?INTERVENTION:  ? ?-Ensure Plus High Protein po BID, each supplement provides 350 kcal and 20 grams of protein.  ? ?-Multivitamin with minerals daily ? ?NUTRITION DIAGNOSIS:  ? ?Severe Malnutrition related to chronic illness (Alzheimer's dementia) as evidenced by severe fat depletion, severe muscle depletion, percent weight loss. ? ?GOAL:  ? ?Patient will meet greater than or equal to 90% of their needs ? ?MONITOR:  ? ?PO intake, Supplement acceptance, Labs, Weight trends, I & O's ? ?REASON FOR ASSESSMENT:  ? ?Consult ?Assessment of nutrition requirement/status ? ?ASSESSMENT:  ? ?88 yo male with PMH CAD, HTN, HLD, arthritis, metastatic prostate cancer, right renal mass/pancreatic mass, physical deconditioning, chronic suprapubic catheter who presented with altered mentation and elevated blood pressure. Admitted for UTI. ? ?Patient in room, very pleasant. Alert/oriented x 3. Reports he has a bad memory so could not tell me what he had for breakfast. Made a guess that he ate sausage and eggs. Reports he has a good appetite.  ?He is agreeable to receiving Ensure supplements, will order.  ? ?Per weight records, pt has lost 10 lbs since 04/15/21 (7% wt loss x 2.5 months, significant for time frame).  ? ?Medications: Remeron ? ?Labs reviewed: ? Low K ? ?NUTRITION - FOCUSED PHYSICAL EXAM: ? ?Flowsheet Row Most Recent Value  ?Orbital Region Severe depletion  ?Upper Arm Region Severe depletion  ?Thoracic and Lumbar Region Severe depletion  ?Buccal Region Severe depletion  ?Temple Region Moderate depletion  ?Clavicle Bone Region Severe depletion  ?Clavicle and Acromion Bone Region Severe depletion  ?Scapular Bone Region Severe depletion  ?Dorsal Hand Severe depletion  ?Patellar Region Severe depletion  ?Anterior Thigh Region Severe depletion  ?Posterior Calf Region Severe depletion  ?Edema (RD Assessment) None  ?Hair  Reviewed  ?Eyes Reviewed  ?Mouth Reviewed  [missing some bottom teeth]  ?Skin Reviewed  ? ?  ? ? ?Diet Order:   ?Diet Order   ? ?       ?  Diet Heart Room service appropriate? Yes; Fluid consistency: Thin  Diet effective now       ?  ? ?  ?  ? ?  ? ? ?EDUCATION NEEDS:  ? ?No education needs have been identified at this time ? ?Skin:  Skin Assessment: Reviewed RN Assessment ? ?Last BM:  5/11 ? ?Height:  ? ?Ht Readings from Last 1 Encounters:  ?07/04/21 '5\' 7"'$  (1.702 m)  ? ? ?Weight:  ? ?Wt Readings from Last 1 Encounters:  ?07/04/21 58 kg  ? ? ?BMI:  Body mass index is 20.03 kg/m?. ? ?Estimated Nutritional Needs:  ? ?Kcal:  1450-1650 ? ?Protein:  70-80g ? ?Fluid:  1.6L/day ? ? ?Clayton Bibles, MS, RD, LDN ?Inpatient Clinical Dietitian ?Contact information available via Amion ? ?

## 2021-07-05 NOTE — Procedures (Signed)
Interventional Radiology Procedure Note ? ?Procedure: Ultrasound guided right wrist aspiration ? ?Findings: Please refer to procedural dictation for full description. Echogenic material visualized in dorsal ulnocarpal joint space.  Aspiration performed yielding approximately 1 mL translucent, viscous, straw-colored fluid.  Samples sent for culture and cell count. ? ?Complications: None immediate ? ?Estimated Blood Loss: < 5 mL ? ?Recommendations: ?Follow up lab results. ? ? ?Ruthann Cancer, MD ?Pager: [phone] ? ? ? ?

## 2021-07-05 NOTE — Progress Notes (Signed)
?Progress Note ? ? ? ?Andre Casey.   ?OMV:[id number]  ?DOB: 30-Mar-1929  ?DOA: 07/03/2021     1 ?PCP: [name], MD ? ?Initial CC: AMS ? ?Hospital Course: ?Andre Casey is an 88 yo male with PMH CAD, HTN, HLD, arthritis, metastatic prostate cancer, right renal mass/pancreatic mass, physical deconditioning, chronic suprapubic catheter who presented with altered mentation and elevated blood pressure. ?There was concern for UTI and he was started on antibiotics and admitted for further work-up.  He also had been wearing a splint around his right wrist due to swelling and pain. ? ?Interval History:  ?No events overnight.  Mentation has continued to improve during hospitalization.  Right wrist pain was also improved this morning.  He was going down for aspiration later this morning of the right wrist. ? ?Assessment and Plan: ?UTI (urinary tract infection) due to urinary indwelling Foley catheter ([place]) ?- Patient had reported foul-smelling urine with red tinge.  He was started on antibiotics for empiric coverage. ?- Follow-up urine culture  ?-Continue cefepime for now ?- given purulent oozing from around cath site noted on 5/12, will have cath exchanged (also may need to be upsized to prevent leaking) ? ?Right wrist pain ?- Afebrile with very mild leukocytosis on admission (10.7).  He has been wearing a splint due to pain and difficulty with ROM.  Ultrasound performed shows severe synovitis with effusion and complex fluid collection measuring 3.4 x 1.7 x 2.1 cm. This is favored to be inflammatory in nature. Uric acid is 4.9.  ?-WBC normalized after admission ?-Okay to DC vancomycin ?-Aspiration performed on 07/05/2021 yielding 1 cc straw-colored fluid.  Possibly not enough for ordered testing but follow-up culture ? ?Prostate cancer metastatic to multiple sites [facility]) ?- Followed by urology at [facility] ?- Appreciate palliative care evaluation ? ?Moderate late onset Alzheimer's dementia ([place]) ?- At  baseline ? ?Hypokalemia ?- Replete as needed ? ?Essential hypertension ?- Blood pressure above goal ?- Resuming home medications ? ?Pancreatic mass ?- noted and followed by oncology  ? ?Atherosclerotic heart disease of native coronary artery with angina pectoris ([place]) ?- Continue aspirin and Coreg ? ?Urethral stricture ?- Continue suprapubic Foley; exchanging on 5/12 ? ? ?Old records reviewed in assessment of this patient ? ?Antimicrobials: ?Vanc 5/10 x 1 ?Cefepime 5/10 >> current ? ? ?DVT prophylaxis:  ?SCDs Start: 07/03/21 2217 ? ? ?Code Status:   Code Status: DNR ? ?Disposition Plan:  Pending PT eval ?Status is: Inpt ? ?Objective: ?Blood pressure (!) 153/85, pulse 79, temperature 99.8 ?F (37.7 ?C), temperature source Oral, resp. rate 19, height '5\' 7"'$  (1.702 m), weight 58 kg, SpO2 98 %.  ?Examination:  ?Physical Exam ?Constitutional:   ?   General: He is not in acute distress. ?HENT:  ?   Head: Normocephalic and atraumatic.  ?   Mouth/Throat:  ?   Mouth: Mucous membranes are moist.  ?Eyes:  ?   Extraocular Movements: Extraocular movements intact.  ?Cardiovascular:  ?   Rate and Rhythm: Normal rate and regular rhythm.  ?   Heart sounds: Normal heart sounds.  ?Pulmonary:  ?   Effort: Pulmonary effort is normal. No respiratory distress.  ?   Breath sounds: Normal breath sounds. No wheezing.  ?Abdominal:  ?   General: Bowel sounds are normal.  ?   Palpations: Abdomen is soft.  ?   Tenderness: There is no abdominal tenderness.  ?   Comments: Suprapubic catheter in place; mild purulent drainage appreciated around site  ?Musculoskeletal:  ?  Cervical back: Normal range of motion and neck supple.  ?   Comments: Right wrist noted with improved TTP; no significant edema  ?Skin: ?   General: Skin is warm and dry.  ?Neurological:  ?   Mental Status: He is alert. Mental status is at baseline.  ?   Comments: Follows commands and moves all 4 extremities easily  ?Psychiatric:     ?   Mood and Affect: Mood normal.  ?   ? ?Consultants:  ?Palliative care ?Hand surgery ? ?Procedures:  ? ? ?Data Reviewed: ?Results for orders placed or performed during the hospital encounter of 07/03/21 (from the past 24 hour(s))  ?Urinalysis, Complete w Microscopic     Status: Abnormal  ? Collection Time: 07/04/21  2:34 PM  ?Result Value Ref Range  ? Color, Urine STRAW (A) YELLOW  ? APPearance CLEAR CLEAR  ? Specific Gravity, Urine 1.014 1.005 - 1.030  ? pH 7.0 5.0 - 8.0  ? Glucose, UA NEGATIVE NEGATIVE mg/dL  ? Hgb urine dipstick NEGATIVE NEGATIVE  ? Bilirubin Urine NEGATIVE NEGATIVE  ? Ketones, ur 5 (A) NEGATIVE mg/dL  ? Protein, ur NEGATIVE NEGATIVE mg/dL  ? Nitrite POSITIVE (A) NEGATIVE  ? Leukocytes,Ua LARGE (A) NEGATIVE  ? RBC / HPF 0-5 0 - 5 RBC/hpf  ? WBC, UA 11-20 0 - 5 WBC/hpf  ? Bacteria, UA RARE (A) NONE SEEN  ? WBC Clumps PRESENT   ?Basic metabolic panel     Status: Abnormal  ? Collection Time: 07/05/21  3:21 AM  ?Result Value Ref Range  ? Sodium 137 135 - 145 mmol/L  ? Potassium 3.2 (L) 3.5 - 5.1 mmol/L  ? Chloride 106 98 - 111 mmol/L  ? CO2 24 22 - 32 mmol/L  ? Glucose, Bld 110 (H) 70 - 99 mg/dL  ? BUN 14 8 - 23 mg/dL  ? Creatinine, Ser 1.02 0.61 - 1.24 mg/dL  ? Calcium 8.4 (L) 8.9 - 10.3 mg/dL  ? GFR, Estimated >60 >60 mL/min  ? Anion gap 7 5 - 15  ?CBC with Differential/Platelet     Status: Abnormal  ? Collection Time: 07/05/21  3:21 AM  ?Result Value Ref Range  ? WBC 7.8 4.0 - 10.5 K/uL  ? RBC 3.69 (L) 4.22 - 5.81 MIL/uL  ? Hemoglobin 10.0 (L) 13.0 - 17.0 g/dL  ? HCT 32.7 (L) 39.0 - 52.0 %  ? MCV 88.6 80.0 - 100.0 fL  ? MCH 27.1 26.0 - 34.0 pg  ? MCHC 30.6 30.0 - 36.0 g/dL  ? RDW 14.9 11.5 - 15.5 %  ? Platelets 314 150 - 400 K/uL  ? nRBC 0.0 0.0 - 0.2 %  ? Neutrophils Relative % 49 %  ? Neutro Abs 3.9 1.7 - 7.7 K/uL  ? Lymphocytes Relative 30 %  ? Lymphs Abs 2.3 0.7 - 4.0 K/uL  ? Monocytes Relative 15 %  ? Monocytes Absolute 1.1 (H) 0.1 - 1.0 K/uL  ? Eosinophils Relative 5 %  ? Eosinophils Absolute 0.4 0.0 - 0.5 K/uL  ? Basophils  Relative 0 %  ? Basophils Absolute 0.0 0.0 - 0.1 K/uL  ? Immature Granulocytes 1 %  ? Abs Immature Granulocytes 0.05 0.00 - 0.07 K/uL  ?Magnesium     Status: None  ? Collection Time: 07/05/21  3:21 AM  ?Result Value Ref Range  ? Magnesium 2.2 1.7 - 2.4 mg/dL  ?  ?I have Reviewed nursing notes, Vitals, and Lab results since pt's last encounter. Pertinent lab results :  see above ?I have ordered test including BMP, CBC, Mg ?I have reviewed the last note from staff over past 24 hours ?I have discussed pt's care plan and test results with nursing staff, case manager ? ? LOS: 1 day  ? ?Dwyane Dee, MD ?[facility] ?07/05/2021, 11:00 AM ?

## 2021-07-05 NOTE — Progress Notes (Signed)
Was rounding on pt when he stated, "I feel like I have to pee out of my penis and not my catheter, this has never happened".  ?I assessed and was able to see the urine coming directly from his penis instead of the suprapubic catheter. ?The nurse tech then told me that she had to change his whole linen because his bed was saturated with urine.  ?

## 2021-07-06 DIAGNOSIS — R5381 Other malaise: Secondary | ICD-10-CM

## 2021-07-06 DIAGNOSIS — N35919 Unspecified urethral stricture, male, unspecified site: Secondary | ICD-10-CM | POA: Diagnosis not present

## 2021-07-06 DIAGNOSIS — E43 Unspecified severe protein-calorie malnutrition: Secondary | ICD-10-CM | POA: Insufficient documentation

## 2021-07-06 DIAGNOSIS — N390 Urinary tract infection, site not specified: Secondary | ICD-10-CM | POA: Diagnosis not present

## 2021-07-06 DIAGNOSIS — T83511A Infection and inflammatory reaction due to indwelling urethral catheter, initial encounter: Secondary | ICD-10-CM | POA: Diagnosis not present

## 2021-07-06 LAB — CBC WITH DIFFERENTIAL/PLATELET
Abs Immature Granulocytes: 0.04 10*3/uL (ref 0.00–0.07)
Basophils Absolute: 0 10*3/uL (ref 0.0–0.1)
Basophils Relative: 1 %
Eosinophils Absolute: 0.3 10*3/uL (ref 0.0–0.5)
Eosinophils Relative: 4 %
HCT: 32.5 % — ABNORMAL LOW (ref 39.0–52.0)
Hemoglobin: 10.5 g/dL — ABNORMAL LOW (ref 13.0–17.0)
Immature Granulocytes: 1 %
Lymphocytes Relative: 27 %
Lymphs Abs: 2.2 10*3/uL (ref 0.7–4.0)
MCH: 27.9 pg (ref 26.0–34.0)
MCHC: 32.3 g/dL (ref 30.0–36.0)
MCV: 86.2 fL (ref 80.0–100.0)
Monocytes Absolute: 1 10*3/uL (ref 0.1–1.0)
Monocytes Relative: 11 %
Neutro Abs: 4.8 10*3/uL (ref 1.7–7.7)
Neutrophils Relative %: 56 %
Platelets: 317 10*3/uL (ref 150–400)
RBC: 3.77 MIL/uL — ABNORMAL LOW (ref 4.22–5.81)
RDW: 14.8 % (ref 11.5–15.5)
WBC: 8.4 10*3/uL (ref 4.0–10.5)
nRBC: 0 % (ref 0.0–0.2)

## 2021-07-06 LAB — BASIC METABOLIC PANEL
Anion gap: 8 (ref 5–15)
BUN: 18 mg/dL (ref 8–23)
CO2: 23 mmol/L (ref 22–32)
Calcium: 8.3 mg/dL — ABNORMAL LOW (ref 8.9–10.3)
Chloride: 104 mmol/L (ref 98–111)
Creatinine, Ser: 0.9 mg/dL (ref 0.61–1.24)
GFR, Estimated: >60 mL/min (ref >60)
Glucose, Bld: 113 mg/dL — ABNORMAL HIGH (ref 70–99)
Potassium: 3.3 mmol/L — ABNORMAL LOW (ref 3.5–5.1)
Sodium: 135 mmol/L (ref 135–145)

## 2021-07-06 LAB — MAGNESIUM: Magnesium: 2.1 mg/dL (ref 1.7–2.4)

## 2021-07-06 MED ORDER — POTASSIUM CHLORIDE CRYS ER 20 MEQ PO TBCR
40.0000 meq | EXTENDED_RELEASE_TABLET | Freq: Once | ORAL | Status: AC
  Administered 2021-07-06: 40 meq via ORAL
  Filled 2021-07-06: qty 2

## 2021-07-06 NOTE — Progress Notes (Signed)
Pharmacy Antibiotic Note ? ?Andre Casey. is a 88 y.o. male admitted on 07/03/2021 with UTI, cellulitis around suprapubic catheter site.  Pharmacy has been consulted for cefepime dosing. ? ?Today, 07/06/21 ?WBC WNL ?SCr WNL and stable ?Afebrile ? ?Today is day #4 of IV antibiotics. Cultures remain negative. ? ?Plan: ?Continue cefepime 2 g IV q12h ?Monitor renal function ? ?Height: '5\' 7"'$  (170.2 cm) ?Weight: 58 kg (127 lb 13.9 oz) ?IBW/kg (Calculated) : 66.1 ? ?Temp (24hrs), Avg:99.5 ?F (37.5 ?C), Min:99.2 ?F (37.3 ?C), Max:100.1 ?F (37.8 ?C) ? ?Recent Labs  ?Lab 07/03/21 ?1355 07/03/21 ?1555 07/04/21 ?0350 07/05/21 ?0321 07/06/21 ?0359  ?WBC 10.7*  --  7.8 7.8 8.4  ?CREATININE 1.00  --  0.75 1.02 0.90  ?LATICACIDVEN 2.4* 1.6  --   --   --   ? ?  ?Estimated Creatinine Clearance: 45.6 mL/min (by C-G formula based on SCr of 0.9 mg/dL).   ?Last documented weight 58 kg, CrCl ~ 40 ml/min.  ? ?Allergies  ?Allergen Reactions  ? Aricept [Donepezil] Nausea Only  ?  Report upset stomach - unable to tolerate.  ? Namzaric [Memantine Hcl-Donepezil Hcl] Other (See Comments)  ?  Stomach upset  ?Pt able to take Memantine by itself.  ? ? ?Antimicrobials this admission: ?5/10 Cefepime >>  ?5/10 Vancomycin >> 5/11 ? ?Dose adjustments this admission: ? ?Microbiology results: ?5/10 Bcx: ngtd ?5/10 Ucx: <10K insignificant growth ?5/13 right wrist aspirate: ngtd ? ?Lenis Noon, PharmD ?07/06/21 ?11:49 AM ?

## 2021-07-06 NOTE — Progress Notes (Signed)
?Progress Note ? ? ? ?Andre Casey.   ?EXB:[id number]  ?DOB: 07/01/1929  ?DOA: 07/03/2021     2 ?PCP: [name], MD ? ?Initial CC: AMS ? ?Hospital Course: ?Andre Casey is an 88 yo male with PMH CAD, HTN, HLD, arthritis, metastatic prostate cancer, right renal mass/pancreatic mass, physical deconditioning, chronic suprapubic catheter who presented with altered mentation and elevated blood pressure. ?There was concern for UTI and he was started on antibiotics and admitted for further work-up.  He also had been wearing a splint around his right wrist due to swelling and pain. ? ?Interval History:  ?No events overnight.  I spoke with his granddaughter this afternoon.  She also spoke with patient on the phone.  He is amenable for going to rehab at discharge. ? ?Assessment and Plan: ?UTI (urinary tract infection) due to urinary indwelling Foley catheter ([place]) ?- Patient had reported foul-smelling urine with red tinge.  He was started on antibiotics for empiric coverage. ?- Follow-up urine culture (insignificant growth), will complete an empiric 5 day course of cefepime (end date 5/14) ?-Suprapubic catheter exchanged on 07/05/2021 ? ?Right wrist pain ?- Afebrile with very mild leukocytosis on admission (10.7).  He has been wearing a splint due to pain and difficulty with ROM.  Ultrasound performed shows severe synovitis with effusion and complex fluid collection measuring 3.4 x 1.7 x 2.1 cm. This is favored to be inflammatory in nature. Uric acid is 4.9.  ?-WBC normalized after admission ?-Okay to DC vancomycin ?-Aspiration performed on 07/05/2021 yielding 1 cc straw-colored fluid.  Possibly not enough for ordered testing but follow-up culture ?-Cultures remain negative ?- Continue pain control and supportive care ? ?Physical deconditioning ?- PT/OT recommending SNF.  Discussed with his granddaughter as well and all are amenable for rehab at discharge ?- [facility] consult placed for assistance ? ?Prostate cancer  metastatic to multiple sites [facility]) ?- Followed by urology at [facility] ?- Appreciate palliative care evaluation ? ?Moderate late onset Alzheimer's dementia ([place]) ?- At baseline ? ?Hypokalemia ?- Replete as needed ? ?Essential hypertension ?- Blood pressure above goal ?- Resuming home medications ? ?Pancreatic mass ?- noted and followed by oncology  ? ?Atherosclerotic heart disease of native coronary artery with angina pectoris ([place]) ?- Continue aspirin and Coreg ? ?Urethral stricture ?- Continue suprapubic Foley; exchanging on 5/12 ? ? ?Old records reviewed in assessment of this patient ? ?Antimicrobials: ?Vanc 5/10 x 1 ?Cefepime 5/10 >> current ? ? ?DVT prophylaxis:  ?SCDs Start: 07/03/21 2217 ? ? ?Code Status:   Code Status: DNR ? ?Disposition Plan: SNF ?Status is: Inpt ? ?Objective: ?Blood pressure 134/79, pulse 69, temperature 99.7 ?F (37.6 ?C), temperature source Oral, resp. rate 17, height '5\' 7"'$  (1.702 m), weight 58 kg, SpO2 97 %.  ?Examination:  ?Physical Exam ?Constitutional:   ?   General: He is not in acute distress. ?HENT:  ?   Head: Normocephalic and atraumatic.  ?   Mouth/Throat:  ?   Mouth: Mucous membranes are moist.  ?Eyes:  ?   Extraocular Movements: Extraocular movements intact.  ?Cardiovascular:  ?   Rate and Rhythm: Normal rate and regular rhythm.  ?   Heart sounds: Normal heart sounds.  ?Pulmonary:  ?   Effort: Pulmonary effort is normal. No respiratory distress.  ?   Breath sounds: Normal breath sounds. No wheezing.  ?Abdominal:  ?   General: Bowel sounds are normal.  ?   Palpations: Abdomen is soft.  ?   Tenderness: There is  no abdominal tenderness.  ?   Comments: Suprapubic catheter in place with better seal and minimal purulent drainage noted.  ?Musculoskeletal:  ?   Cervical back: Normal range of motion and neck supple.  ?   Comments: Right wrist noted with improved TTP; no significant edema  ?Skin: ?   General: Skin is warm and dry.  ?Neurological:  ?   Mental Status: He is alert. Mental  status is at baseline.  ?   Comments: Follows commands and moves all 4 extremities easily  ?Psychiatric:     ?   Mood and Affect: Mood normal.  ?  ? ?Consultants:  ?Palliative care ?Hand surgery ? ?Procedures:  ? ? ?Data Reviewed: ?Results for orders placed or performed during the hospital encounter of 07/03/21 (from the past 24 hour(s))  ?Glucose, capillary     Status: Abnormal  ? Collection Time: 07/05/21  7:51 PM  ?Result Value Ref Range  ? Glucose-Capillary 155 (H) 70 - 99 mg/dL  ?Basic metabolic panel     Status: Abnormal  ? Collection Time: 07/06/21  3:59 AM  ?Result Value Ref Range  ? Sodium 135 135 - 145 mmol/L  ? Potassium 3.3 (L) 3.5 - 5.1 mmol/L  ? Chloride 104 98 - 111 mmol/L  ? CO2 23 22 - 32 mmol/L  ? Glucose, Bld 113 (H) 70 - 99 mg/dL  ? BUN 18 8 - 23 mg/dL  ? Creatinine, Ser 0.90 0.61 - 1.24 mg/dL  ? Calcium 8.3 (L) 8.9 - 10.3 mg/dL  ? GFR, Estimated >60 >60 mL/min  ? Anion gap 8 5 - 15  ?CBC with Differential/Platelet     Status: Abnormal  ? Collection Time: 07/06/21  3:59 AM  ?Result Value Ref Range  ? WBC 8.4 4.0 - 10.5 K/uL  ? RBC 3.77 (L) 4.22 - 5.81 MIL/uL  ? Hemoglobin 10.5 (L) 13.0 - 17.0 g/dL  ? HCT 32.5 (L) 39.0 - 52.0 %  ? MCV 86.2 80.0 - 100.0 fL  ? MCH 27.9 26.0 - 34.0 pg  ? MCHC 32.3 30.0 - 36.0 g/dL  ? RDW 14.8 11.5 - 15.5 %  ? Platelets 317 150 - 400 K/uL  ? nRBC 0.0 0.0 - 0.2 %  ? Neutrophils Relative % 56 %  ? Neutro Abs 4.8 1.7 - 7.7 K/uL  ? Lymphocytes Relative 27 %  ? Lymphs Abs 2.2 0.7 - 4.0 K/uL  ? Monocytes Relative 11 %  ? Monocytes Absolute 1.0 0.1 - 1.0 K/uL  ? Eosinophils Relative 4 %  ? Eosinophils Absolute 0.3 0.0 - 0.5 K/uL  ? Basophils Relative 1 %  ? Basophils Absolute 0.0 0.0 - 0.1 K/uL  ? Immature Granulocytes 1 %  ? Abs Immature Granulocytes 0.04 0.00 - 0.07 K/uL  ?Magnesium     Status: None  ? Collection Time: 07/06/21  3:59 AM  ?Result Value Ref Range  ? Magnesium 2.1 1.7 - 2.4 mg/dL  ?  ?I have Reviewed nursing notes, Vitals, and Lab results since pt's last  encounter. Pertinent lab results : see above ?I have ordered test including BMP, CBC, Mg ?I have reviewed the last note from staff over past 24 hours ?I have discussed pt's care plan and test results with nursing staff, case manager ? ? LOS: 2 days  ? ?Dwyane Dee, MD ?[facility] ?07/06/2021, 5:44 PM ?

## 2021-07-06 NOTE — Assessment & Plan Note (Addendum)
-   PT/OT recommending SNF.   ?-Awaiting placement ?

## 2021-07-06 NOTE — [place] FL2 (Addendum)
? MEDICAID FL2 LEVEL OF CARE SCREENING TOOL  ?  ? ?IDENTIFICATION  ?Patient Name: ?Andre Casey. Birthdate: 1930-01-20 Sex: male Admission Date (Current Location): ?07/03/2021  ?[place] and [place] Number: ? [place] ?  Facility and Address:  ?[facility],  [place], [place] ?     Provider Number: ?[id number]  ?Attending Physician Name and Address:  ?Dwyane Dee, MD ? Relative Name and Phone Number:  ?Ivar Drape Granddaughter [phone] ?   ?Current Level of Care: ?Hospital Recommended Level of Care: ?[place] Prior Approval Number: ?  ? ?Date Approved/Denied: ?  PASRR Number: ?[id number] ? ?Discharge Plan: ?SNF ?  ? ?Current Diagnoses: ?Patient Active Problem List  ? Diagnosis Date Noted  ? Protein-calorie malnutrition, severe 07/06/2021  ? Right wrist pain 07/04/2021  ? UTI (urinary tract infection) 07/03/2021  ? Hypokalemia 07/03/2021  ? Scrotal abscess 06/18/2021  ? Palliative care encounter 06/18/2021  ? Physical deconditioning 04/28/2021  ? Bandemia 04/27/2021  ? Severe sepsis ([place]) 04/26/2021  ? Essential hypertension 04/26/2021  ? UTI (urinary tract infection) due to urinary indwelling Foley catheter ([place]) 04/25/2021  ? AKI on CKD-3A 04/25/2021  ? Hyponatremia 04/25/2021  ? Normocytic anemia 04/25/2021  ? Pancreatic mass 04/25/2021  ? Lab test positive for detection of COVID-19 virus 04/15/2021  ? Prostate cancer metastatic to multiple sites [facility]) 04/15/2021  ? Chronic indwelling Foley catheter 04/15/2021  ? Moderate late onset Alzheimer's dementia ([place]) 04/08/2021  ? Gait abnormality 04/08/2021  ? Renal insufficiency 03/12/2021  ? Renal mass, right 12/04/2019  ? LBBB (left bundle branch block) 06/07/2019  ? Family history of Alzheimer's disease 03/25/2018  ? Atherosclerotic heart disease of native coronary artery with angina pectoris ([place]) 01/15/2016  ? CAD-S/P PCI/DES 01/15/2016  ? Family history of sudden cardiac death 02-05-2016  ?  Dyslipidemia, goal LDL below 70 01/08/2016  ? Paresthesia 07/18/2015  ? S/P shoulder replacement 04/20/2015  ? Malignant neoplasm of prostate ([place]) 02/09/2015  ? Urethral stricture 07/25/2013  ? History of prostate cancer 08/04/2011  ? ED (erectile dysfunction) of organic origin 02/03/2011  ? Male urinary stress incontinence 01/31/2011  ? HEMORRHOIDS-EXTERNAL 08/02/2007  ? INCONTINENCE, FECAL 08/02/2007  ? PERSONAL HX COLONIC POLYPS 08/02/2007  ? ? ?Orientation RESPIRATION BLADDER Height & Weight   ?  ?Self, Time, Situation ? Normal Incontinent(Suprapubic Catheter) Weight: 58 kg ?Height:  '5\' 7"'$  (170.2 cm)  ?BEHAVIORAL SYMPTOMS/MOOD NEUROLOGICAL BOWEL NUTRITION STATUS  ?    Incontinent Diet (Heart Healthy)  ?AMBULATORY STATUS COMMUNICATION OF NEEDS Skin   ?Extensive Assist Verbally Normal ?  ?  ?  ?    ?     ?     ? ? ?Personal Care Assistance Level of Assistance  ?Bathing, Feeding, Dressing Bathing Assistance: Maximum assistance ?Feeding assistance: Limited assistance ?Dressing Assistance: Maximum assistance ?   ? ?Functional Limitations Info  ?Sight, Hearing, Speech Sight Info: Adequate ?Hearing Info: Adequate ?Speech Info: Adequate  ? ? ?SPECIAL CARE FACTORS FREQUENCY  ?PT (By licensed PT), OT (By licensed OT)   ?  ?PT Frequency: x5 week ?OT Frequency: x5 week ?  ?  ?  ?   ? ? ?Contractures Contractures Info: Not present (Brace Right wrist)  ? ? ?Additional Factors Info  ?Code Status, Allergies Code Status Info: DNR ?Allergies Info: Aricept (Donepezil), Namzaric (Memantine Hcl-donepezil Hcl) ?  ?  ?  ?   ? ?Current Medications (07/06/2021):  This is the current hospital active medication list ?Current Facility-Administered Medications  ?  Medication Dose Route Frequency Provider Last Rate Last Admin  ? acetaminophen (TYLENOL) tablet 650 mg  650 mg Oral Q6H PRN Toy Baker, MD      ? Or  ? acetaminophen (TYLENOL) suppository 650 mg  650 mg Rectal Q6H PRN Doutova, Anastassia, MD      ? amLODipine (NORVASC)  tablet 2.5 mg  2.5 mg Oral Daily Dwyane Dee, MD   2.5 mg at 07/06/21 0902  ? aspirin EC tablet 81 mg  81 mg Oral Daily Toy Baker, MD   81 mg at 07/06/21 0901  ? carvedilol (COREG) tablet 6.25 mg  6.25 mg Oral BID WC Dwyane Dee, MD   6.25 mg at 07/06/21 8250  ? ceFEPIme (MAXIPIME) 2 g in sodium chloride 0.9 % 100 mL IVPB  2 g Intravenous Q12H Shade, Christine E, RPH 200 mL/hr at 07/06/21 0409 2 g at 07/06/21 0409  ? feeding supplement (ENSURE ENLIVE / ENSURE PLUS) liquid 237 mL  237 mL Oral TID BM Dwyane Dee, MD   237 mL at 07/06/21 1316  ? hydrALAZINE (APRESOLINE) tablet 25 mg  25 mg Oral Q4H PRN Dwyane Dee, MD      ? HYDROcodone-acetaminophen (NORCO/VICODIN) 5-325 MG per tablet 1-2 tablet  1-2 tablet Oral Q4H PRN Toy Baker, MD   2 tablet at 07/06/21 1457  ? labetalol (NORMODYNE) injection 10 mg  10 mg Intravenous Q4H PRN Dwyane Dee, MD      ? lidocaine (XYLOCAINE) 1 % (with pres) injection    PRN Suzette Battiest, MD   10 mL at 07/05/21 0846  ? memantine (NAMENDA) tablet 10 mg  10 mg Oral BID Toy Baker, MD   10 mg at 07/06/21 0901  ? mirtazapine (REMERON) tablet 7.5 mg  7.5 mg Oral QHS Dwyane Dee, MD   7.5 mg at 07/05/21 2106  ? multivitamin with minerals tablet 1 tablet  1 tablet Oral Daily Dwyane Dee, MD   1 tablet at 07/06/21 0901  ? senna-docusate (Senokot-S) tablet 1 tablet  1 tablet Oral QHS PRN Toy Baker, MD      ? traMADol (ULTRAM) tablet 50 mg  50 mg Oral Q6H PRN Toy Baker, MD   50 mg at 07/06/21 1157  ? ? ? ?Discharge Medications: ?Please see discharge summary for a list of discharge medications. ? ?Relevant Imaging Results: ? ?Relevant Lab Results: ? ? ?Additional Information ?SS#[id number] ? ?Marshae Azam, RN ? ? ? ? ?

## 2021-07-06 NOTE — TOC Progression Note (Addendum)
Transition of Care (TOC) - Progression Note  ? ? ?Patient Details  ?Name: Andre Casey. ?MRN: [id number] ?Date of Birth: 02/23/1930 ? ?Transition of Care (TOC) CM/SW Contact  ?Purcell Mouton, RN ?Phone Number: ?07/06/2021, 3:57 PM ? ?Clinical Narrative:    ?Spoke with pt and granddaughter [name] concerning disposition to SNF. [name] agreed that pt should go to SNF for rehab. Pt lives alone. FL2 sent to SNF's in [place] area at [place] request.  ? ? ?Expected Discharge Plan: [place] ?Barriers to Discharge: No Barriers Identified ? ?Expected Discharge Plan and Services ?Expected Discharge Plan: [place] ?  ?  ?Post Acute Care Choice: [place] ?Living arrangements for the past 2 months: [place] ?                ?  ?  ?  ?  ?  ?  ?  ?  ?  ?  ? ? ?Social Determinants of Health (SDOH) Interventions ?  ? ?Readmission Risk Interventions ?   ? View : No data to display.  ?  ?  ?  ? ? ?

## 2021-07-07 DIAGNOSIS — N390 Urinary tract infection, site not specified: Secondary | ICD-10-CM | POA: Diagnosis not present

## 2021-07-07 DIAGNOSIS — R5381 Other malaise: Secondary | ICD-10-CM | POA: Diagnosis not present

## 2021-07-07 DIAGNOSIS — T83511A Infection and inflammatory reaction due to indwelling urethral catheter, initial encounter: Secondary | ICD-10-CM | POA: Diagnosis not present

## 2021-07-07 LAB — CBC WITH DIFFERENTIAL/PLATELET
Abs Immature Granulocytes: 0.1 10*3/uL — ABNORMAL HIGH (ref 0.00–0.07)
Basophils Absolute: 0.1 10*3/uL (ref 0.0–0.1)
Basophils Relative: 1 %
Eosinophils Absolute: 0.5 10*3/uL (ref 0.0–0.5)
Eosinophils Relative: 6 %
HCT: 33.2 % — ABNORMAL LOW (ref 39.0–52.0)
Hemoglobin: 10.3 g/dL — ABNORMAL LOW (ref 13.0–17.0)
Immature Granulocytes: 1 %
Lymphocytes Relative: 30 %
Lymphs Abs: 2.2 10*3/uL (ref 0.7–4.0)
MCH: 27.5 pg (ref 26.0–34.0)
MCHC: 31 g/dL (ref 30.0–36.0)
MCV: 88.8 fL (ref 80.0–100.0)
Monocytes Absolute: 1.1 10*3/uL — ABNORMAL HIGH (ref 0.1–1.0)
Monocytes Relative: 15 %
Neutro Abs: 3.5 10*3/uL (ref 1.7–7.7)
Neutrophils Relative %: 47 %
Platelets: 323 10*3/uL (ref 150–400)
RBC: 3.74 MIL/uL — ABNORMAL LOW (ref 4.22–5.81)
RDW: 14.8 % (ref 11.5–15.5)
WBC: 7.4 10*3/uL (ref 4.0–10.5)
nRBC: 0 % (ref 0.0–0.2)

## 2021-07-07 LAB — URINALYSIS, ROUTINE W REFLEX MICROSCOPIC
Bilirubin Urine: NEGATIVE
Glucose, UA: NEGATIVE mg/dL
Hgb urine dipstick: NEGATIVE
Ketones, ur: 5 mg/dL — AB
Nitrite: NEGATIVE
Protein, ur: 100 mg/dL — AB
Specific Gravity, Urine: 1.021 (ref 1.005–1.030)
WBC, UA: >50 WBC/hpf — ABNORMAL HIGH (ref 0–5)
pH: 5 (ref 5.0–8.0)

## 2021-07-07 LAB — BASIC METABOLIC PANEL
Anion gap: 6 (ref 5–15)
BUN: 30 mg/dL — ABNORMAL HIGH (ref 8–23)
CO2: 26 mmol/L (ref 22–32)
Calcium: 8.6 mg/dL — ABNORMAL LOW (ref 8.9–10.3)
Chloride: 105 mmol/L (ref 98–111)
Creatinine, Ser: 1.15 mg/dL (ref 0.61–1.24)
GFR, Estimated: >60 mL/min (ref >60)
Glucose, Bld: 109 mg/dL — ABNORMAL HIGH (ref 70–99)
Potassium: 4 mmol/L (ref 3.5–5.1)
Sodium: 137 mmol/L (ref 135–145)

## 2021-07-07 LAB — MAGNESIUM: Magnesium: 2.3 mg/dL (ref 1.7–2.4)

## 2021-07-07 MED ORDER — SENNOSIDES-DOCUSATE SODIUM 8.6-50 MG PO TABS
2.0000 | ORAL_TABLET | Freq: Two times a day (BID) | ORAL | Status: DC
  Administered 2021-07-07 – 2021-07-10 (×6): 2 via ORAL
  Filled 2021-07-07 (×6): qty 2

## 2021-07-07 MED ORDER — LACTULOSE 10 GM/15ML PO SOLN: 20.0000 g | Freq: Two times a day (BID) | ORAL | Status: DC | PRN

## 2021-07-07 MED ORDER — SORBITOL 70 % SOLN: 30.0000 mL | Freq: Every day | Status: DC | PRN

## 2021-07-07 MED ORDER — OXYCODONE HCL 5 MG PO TABS
5.0000 mg | ORAL_TABLET | Freq: Four times a day (QID) | ORAL | Status: DC | PRN
  Administered 2021-07-08 – 2021-07-09 (×3): 5 mg via ORAL
  Filled 2021-07-07 (×3): qty 1

## 2021-07-07 MED ORDER — POLYETHYLENE GLYCOL 3350 17 G PO PACK
17.0000 g | PACK | Freq: Every day | ORAL | Status: DC
  Administered 2021-07-07 – 2021-07-10 (×2): 17 g via ORAL
  Filled 2021-07-07 (×3): qty 1

## 2021-07-07 MED ORDER — ACETAMINOPHEN 325 MG PO TABS
650.0000 mg | ORAL_TABLET | ORAL | Status: DC | PRN
  Administered 2021-07-09 – 2021-07-10 (×2): 650 mg via ORAL
  Filled 2021-07-07 (×2): qty 2

## 2021-07-07 NOTE — Progress Notes (Signed)
?Progress Note ? ? ? ?Andre Casey.   ?ALP:[id number]  ?DOB: 14-May-1929  ?DOA: 07/03/2021     3 ?PCP: [name], MD ? ?Initial CC: AMS ? ?Hospital Course: ?Andre Casey is an 88 yo male with PMH CAD, HTN, HLD, arthritis, metastatic prostate cancer, right renal mass/pancreatic mass, physical deconditioning, chronic suprapubic catheter who presented with altered mentation and elevated blood pressure. ?There was concern for UTI and he was started on antibiotics and admitted for further work-up.  He also had been wearing a splint around his right wrist due to swelling and pain. ? ?Interval History:  ?No events overnight.  Resting in bed this morning.  Right wrist pain is a little better today he says but still does not have full range of motion back yet.  He is also still amenable for going to rehab at discharge. ? ?Assessment and Plan: ?UTI (urinary tract infection) due to urinary indwelling Foley catheter ([place]) ?- Patient had reported foul-smelling urine with red tinge.  He was started on antibiotics for empiric coverage. ?- Follow-up urine culture (insignificant growth), will complete an empiric 5 day course of cefepime (end date 5/14) ?-Suprapubic catheter exchanged on 07/05/2021 ? ?Right wrist pain ?- Afebrile with very mild leukocytosis on admission (10.7).  He has been wearing a splint due to pain and difficulty with ROM.  Ultrasound performed shows severe synovitis with effusion and complex fluid collection measuring 3.4 x 1.7 x 2.1 cm. This is favored to be inflammatory in nature. Uric acid is 4.9.  ?-WBC normalized after admission ?-Okay to DC vancomycin ?-Aspiration performed on 07/05/2021 yielding 1 cc straw-colored fluid.  Possibly not enough for ordered testing but follow-up culture ?-Cultures remain negative ?- Continue pain control and supportive care ? ?Physical deconditioning ?- PT/OT recommending SNF.  Discussed with his granddaughter as well and all are amenable for rehab at  discharge ?- [facility] consult placed for assistance ? ?Prostate cancer metastatic to multiple sites [facility]) ?- Followed by urology at [facility] ?- Appreciate palliative care evaluation ? ?Moderate late onset Alzheimer's dementia ([place]) ?- At baseline ? ?Hypokalemia ?- Replete as needed ? ?Essential hypertension ?- Blood pressure above goal ?- Resuming home medications ? ?Pancreatic mass ?- noted and followed by oncology  ? ?Atherosclerotic heart disease of native coronary artery with angina pectoris ([place]) ?- Continue aspirin and Coreg ? ?Urethral stricture ?- Continue suprapubic Foley; exchanging on 5/12 ? ? ?Old records reviewed in assessment of this patient ? ?Antimicrobials: ?Vanc 5/10 x 1 ?Cefepime 5/10 >> 07/07/2021 ? ? ?DVT prophylaxis:  ?SCDs Start: 07/03/21 2217 ? ? ?Code Status:   Code Status: DNR ? ?Disposition Plan: SNF ?Status is: Inpt ? ?Objective: ?Blood pressure 136/77, pulse (!) 58, temperature 98.1 ?F (36.7 ?C), temperature source Oral, resp. rate 18, height '5\' 7"'$  (1.702 m), weight 58 kg, SpO2 99 %.  ?Examination:  ?Physical Exam ?Constitutional:   ?   General: He is not in acute distress. ?HENT:  ?   Head: Normocephalic and atraumatic.  ?   Mouth/Throat:  ?   Mouth: Mucous membranes are moist.  ?Eyes:  ?   Extraocular Movements: Extraocular movements intact.  ?Cardiovascular:  ?   Rate and Rhythm: Normal rate and regular rhythm.  ?   Heart sounds: Normal heart sounds.  ?Pulmonary:  ?   Effort: Pulmonary effort is normal. No respiratory distress.  ?   Breath sounds: Normal breath sounds. No wheezing.  ?Abdominal:  ?   General: Bowel sounds are normal.  ?  Palpations: Abdomen is soft.  ?   Tenderness: There is no abdominal tenderness.  ?   Comments: Suprapubic catheter in place with better seal and minimal purulent drainage noted.  ?Musculoskeletal:  ?   Cervical back: Normal range of motion and neck supple.  ?   Comments: Right wrist noted with improved TTP; no significant edema  ?Skin: ?   General: Skin is  warm and dry.  ?Neurological:  ?   Mental Status: He is alert. Mental status is at baseline.  ?   Comments: Follows commands and moves all 4 extremities easily  ?Psychiatric:     ?   Mood and Affect: Mood normal.  ?  ? ?Consultants:  ?Palliative care ?Hand surgery ? ?Procedures:  ?Suprapubic catheter exchange, 07/05/2021 ? ?Data Reviewed: ?Results for orders placed or performed during the hospital encounter of 07/03/21 (from the past 24 hour(s))  ?Basic metabolic panel     Status: Abnormal  ? Collection Time: 07/07/21  3:22 AM  ?Result Value Ref Range  ? Sodium 137 135 - 145 mmol/L  ? Potassium 4.0 3.5 - 5.1 mmol/L  ? Chloride 105 98 - 111 mmol/L  ? CO2 26 22 - 32 mmol/L  ? Glucose, Bld 109 (H) 70 - 99 mg/dL  ? BUN 30 (H) 8 - 23 mg/dL  ? Creatinine, Ser 1.15 0.61 - 1.24 mg/dL  ? Calcium 8.6 (L) 8.9 - 10.3 mg/dL  ? GFR, Estimated >60 >60 mL/min  ? Anion gap 6 5 - 15  ?CBC with Differential/Platelet     Status: Abnormal  ? Collection Time: 07/07/21  3:22 AM  ?Result Value Ref Range  ? WBC 7.4 4.0 - 10.5 K/uL  ? RBC 3.74 (L) 4.22 - 5.81 MIL/uL  ? Hemoglobin 10.3 (L) 13.0 - 17.0 g/dL  ? HCT 33.2 (L) 39.0 - 52.0 %  ? MCV 88.8 80.0 - 100.0 fL  ? MCH 27.5 26.0 - 34.0 pg  ? MCHC 31.0 30.0 - 36.0 g/dL  ? RDW 14.8 11.5 - 15.5 %  ? Platelets 323 150 - 400 K/uL  ? nRBC 0.0 0.0 - 0.2 %  ? Neutrophils Relative % 47 %  ? Neutro Abs 3.5 1.7 - 7.7 K/uL  ? Lymphocytes Relative 30 %  ? Lymphs Abs 2.2 0.7 - 4.0 K/uL  ? Monocytes Relative 15 %  ? Monocytes Absolute 1.1 (H) 0.1 - 1.0 K/uL  ? Eosinophils Relative 6 %  ? Eosinophils Absolute 0.5 0.0 - 0.5 K/uL  ? Basophils Relative 1 %  ? Basophils Absolute 0.1 0.0 - 0.1 K/uL  ? Immature Granulocytes 1 %  ? Abs Immature Granulocytes 0.10 (H) 0.00 - 0.07 K/uL  ?Magnesium     Status: None  ? Collection Time: 07/07/21  3:22 AM  ?Result Value Ref Range  ? Magnesium 2.3 1.7 - 2.4 mg/dL  ?  ?I have Reviewed nursing notes, Vitals, and Lab results since pt's last encounter. Pertinent lab results :  see above ?I have ordered test including BMP, CBC, Mg ?I have reviewed the last note from staff over past 24 hours ?I have discussed pt's care plan and test results with nursing staff, case manager ? ? LOS: 3 days  ? ?Dwyane Dee, MD ?[facility] ?07/07/2021, 11:41 AM ?

## 2021-07-07 NOTE — Progress Notes (Signed)
Patient denies N/V.  2/10 chronic pain in neck.  Tylenol administered for pain and rectal temperature. ? ?Andre Fava, RN  ? ? 07/07/21 1525  ?Assess: MEWS Score  ?Temp (!) 102.9 ?F (39.4 ?C)  ?BP 138/61  ?Pulse Rate 77  ?Level of Consciousness Alert  ?SpO2 96 %  ?O2 Device Room Air  ?Assess: MEWS Score  ?MEWS Temp 2  ?MEWS Systolic 0  ?MEWS Pulse 0  ?MEWS RR 0  ?MEWS LOC 0  ?MEWS Score 2  ?MEWS Score Color Yellow  ?Assess: if the MEWS score is Yellow or Red  ?Were vital signs taken at a resting state? Yes  ?Focused Assessment No change from prior assessment  ?Does the patient meet 2 or more of the SIRS criteria? Yes  ?Does the patient have a confirmed or suspected source of infection? Yes  ?Provider and Rapid Response Notified? Yes  ?MEWS guidelines implemented *See Row Information* Yes  ?Treat  ?MEWS Interventions Escalated (See documentation below)  ?Pain Scale 0-10  ?Pain Score 2  ?Pain Intervention(s) Medication (See eMAR) ?(tylenol)  ?Take Vital Signs  ?Increase Vital Sign Frequency  Yellow: Q 2hr X 2 then Q 4hr X 2, if remains yellow, continue Q 4hrs  ?Escalate  ?MEWS: Escalate Yellow: discuss with charge nurse/RN and consider discussing with provider and RRT  ?Notify: Charge Nurse/RN  ?Name of Charge Nurse/RN Notified Sharyn Blitz, RN  ?Date Charge Nurse/RN Notified 07/07/21  ?Time Charge Nurse/RN Notified 1531  ?Notify: Provider  ?Provider Name/Title Dwyane Dee, MD  ?Date Provider Notified 07/07/21  ?Time Provider Notified 1531  ?Method of Notification  ?(secure chat)  ?Notification Reason Change in status  ?Assess: SIRS CRITERIA  ?SIRS Temperature  1  ?SIRS Pulse 0  ?SIRS Respirations  0  ?SIRS WBC 0  ?SIRS Score Sum  1  ? ? ?

## 2021-07-08 DIAGNOSIS — N390 Urinary tract infection, site not specified: Secondary | ICD-10-CM | POA: Diagnosis not present

## 2021-07-08 DIAGNOSIS — R5381 Other malaise: Secondary | ICD-10-CM | POA: Diagnosis not present

## 2021-07-08 DIAGNOSIS — T83511A Infection and inflammatory reaction due to indwelling urethral catheter, initial encounter: Secondary | ICD-10-CM | POA: Diagnosis not present

## 2021-07-08 LAB — BASIC METABOLIC PANEL
Anion gap: 7 (ref 5–15)
BUN: 34 mg/dL — ABNORMAL HIGH (ref 8–23)
CO2: 24 mmol/L (ref 22–32)
Calcium: 8.6 mg/dL — ABNORMAL LOW (ref 8.9–10.3)
Chloride: 104 mmol/L (ref 98–111)
Creatinine, Ser: 1.07 mg/dL (ref 0.61–1.24)
GFR, Estimated: >60 mL/min (ref >60)
Glucose, Bld: 108 mg/dL — ABNORMAL HIGH (ref 70–99)
Potassium: 4.2 mmol/L (ref 3.5–5.1)
Sodium: 135 mmol/L (ref 135–145)

## 2021-07-08 LAB — CULTURE, BLOOD (ROUTINE X 2)
Culture: NO GROWTH
Culture: NO GROWTH
Special Requests: ADEQUATE

## 2021-07-08 LAB — CBC WITH DIFFERENTIAL/PLATELET
Abs Immature Granulocytes: 0.1 10*3/uL — ABNORMAL HIGH (ref 0.00–0.07)
Basophils Absolute: 0 10*3/uL (ref 0.0–0.1)
Basophils Relative: 1 %
Eosinophils Absolute: 0.4 10*3/uL (ref 0.0–0.5)
Eosinophils Relative: 5 %
HCT: 30.9 % — ABNORMAL LOW (ref 39.0–52.0)
Hemoglobin: 9.8 g/dL — ABNORMAL LOW (ref 13.0–17.0)
Immature Granulocytes: 1 %
Lymphocytes Relative: 28 %
Lymphs Abs: 2.3 10*3/uL (ref 0.7–4.0)
MCH: 27.9 pg (ref 26.0–34.0)
MCHC: 31.7 g/dL (ref 30.0–36.0)
MCV: 88 fL (ref 80.0–100.0)
Monocytes Absolute: 1 10*3/uL (ref 0.1–1.0)
Monocytes Relative: 12 %
Neutro Abs: 4.3 10*3/uL (ref 1.7–7.7)
Neutrophils Relative %: 53 %
Platelets: 359 10*3/uL (ref 150–400)
RBC: 3.51 MIL/uL — ABNORMAL LOW (ref 4.22–5.81)
RDW: 15.1 % (ref 11.5–15.5)
WBC: 8.2 10*3/uL (ref 4.0–10.5)
nRBC: 0 % (ref 0.0–0.2)

## 2021-07-08 LAB — MAGNESIUM: Magnesium: 2.5 mg/dL — ABNORMAL HIGH (ref 1.7–2.4)

## 2021-07-08 LAB — BODY FLUID CULTURE W GRAM STAIN: Culture: NO GROWTH

## 2021-07-08 NOTE — Progress Notes (Signed)
?Progress Note ? ? ? ?Andre Casey.   ?ATF:[id number]  ?DOB: 09-06-1929  ?DOA: 07/03/2021     4 ?PCP: [name], MD ? ?Initial CC: AMS ? ?Hospital Course: ?Andre Casey is an 88 yo male with PMH CAD, HTN, HLD, arthritis, metastatic prostate cancer, right renal mass/pancreatic mass, physical deconditioning, chronic suprapubic catheter who presented with altered mentation and elevated blood pressure. ?There was concern for UTI and he was started on antibiotics and admitted for further work-up.  He also had been wearing a splint around his right wrist due to swelling and pain. ? ?Interval History:  ?No events overnight.  Fever was noted yesterday and this morning he was a little more lethargic but otherwise feeling okay.  Pain in wrist same as usual.  Denied any lower abdominal pain.  Urine remains clear in appearance. ? ?Assessment and Plan: ?UTI (urinary tract infection) due to urinary indwelling Foley catheter ([place]) ?- Patient had reported foul-smelling urine with red tinge.  He was started on antibiotics for empiric coverage. ?- Follow-up urine culture (insignificant growth), will complete an empiric 5 day course of cefepime (end date 5/14) ?-Suprapubic catheter exchanged on 07/05/2021 ?-Patient developed a fever on 07/07/2021.  He has no leukocytosis and fever has defervesced.  Urinalysis collected does show some evidence of possible infection although he is certainly colonized to some degree.  Follow-up urine culture and will decide at that time if clinically relevant to retreat; he remains nontoxic-appearing ? ?Right wrist pain ?- Afebrile with very mild leukocytosis on admission (10.7).  He has been wearing a splint due to pain and difficulty with ROM.  Ultrasound performed shows severe synovitis with effusion and complex fluid collection measuring 3.4 x 1.7 x 2.1 cm. This is favored to be inflammatory in nature. Uric acid is 4.9.  ?-WBC normalized after admission ?-Okay to DC  vancomycin ?-Aspiration performed on 07/05/2021 yielding 1 cc straw-colored fluid.  Possibly not enough for ordered testing but follow-up culture ?-Cultures remain negative ?- Continue pain control and supportive care ? ?Physical deconditioning ?- PT/OT recommending SNF.  Discussed with his granddaughter as well and all are amenable for rehab at discharge ?- [facility] consult placed for assistance ? ?Prostate cancer metastatic to multiple sites [facility]) ?- Followed by urology at [facility] ?- Appreciate palliative care evaluation ? ?Moderate late onset Alzheimer's dementia ([place]) ?- At baseline ? ?Hypokalemia ?- Replete as needed ? ?Essential hypertension ?- Blood pressure above goal ?- Resuming home medications ? ?Pancreatic mass ?- noted and followed by oncology  ? ?Atherosclerotic heart disease of native coronary artery with angina pectoris ([place]) ?- Continue aspirin and Coreg ? ?Urethral stricture ?- Continue suprapubic Foley; exchanging on 5/12 ? ? ?Old records reviewed in assessment of this patient ? ?Antimicrobials: ?Vanc 5/10 x 1 ?Cefepime 5/10 >> 07/07/2021 ? ? ?DVT prophylaxis:  ?SCDs Start: 07/03/21 2217 ? ? ?Code Status:   Code Status: DNR ? ?Disposition Plan: SNF ?Status is: Inpt ? ?Objective: ?Blood pressure (!) 112/56, pulse 60, temperature 98.1 ?F (36.7 ?C), temperature source Oral, resp. rate 16, height '5\' 7"'$  (1.702 m), weight 58 kg, SpO2 96 %.  ?Examination:  ?Physical Exam ?Constitutional:   ?   General: He is not in acute distress. ?HENT:  ?   Head: Normocephalic and atraumatic.  ?   Mouth/Throat:  ?   Mouth: Mucous membranes are moist.  ?Eyes:  ?   Extraocular Movements: Extraocular movements intact.  ?Cardiovascular:  ?   Rate and Rhythm: Normal rate and  regular rhythm.  ?   Heart sounds: Normal heart sounds.  ?Pulmonary:  ?   Effort: Pulmonary effort is normal. No respiratory distress.  ?   Breath sounds: Normal breath sounds. No wheezing.  ?Abdominal:  ?   General: Bowel sounds are normal.  ?   Palpations:  Abdomen is soft.  ?   Tenderness: There is no abdominal tenderness.  ?   Comments: Suprapubic catheter in place  ?Musculoskeletal:  ?   Cervical back: Normal range of motion and neck supple.  ?   Comments: Right wrist noted with improved TTP; no significant edema  ?Skin: ?   General: Skin is warm and dry.  ?Neurological:  ?   Mental Status: He is alert. Mental status is at baseline.  ?   Comments: Follows commands and moves all 4 extremities easily  ?Psychiatric:     ?   Mood and Affect: Mood normal.  ?  ? ?Consultants:  ?Palliative care ?Hand surgery ? ?Procedures:  ?Suprapubic catheter exchange, 07/05/2021 ? ?Data Reviewed: ?Results for orders placed or performed during the hospital encounter of 07/03/21 (from the past 24 hour(s))  ?Culture, blood (Routine X 2) w Reflex to ID Panel     Status: None (Preliminary result)  ? Collection Time: 07/07/21  4:52 PM  ? Specimen: BLOOD  ?Result Value Ref Range  ? Specimen Description    ?  BLOOD BLOOD RIGHT HAND ?Performed at [facility], [place] [address] [place], [place] [address] ?  ? Special Requests    ?  BOTTLES DRAWN AEROBIC ONLY Blood Culture results may not be optimal due to an inadequate volume of blood received in culture bottles ?Performed at [facility], [place] [address] [place], [place] [address] ?  ? Culture    ?  NO GROWTH < 24 HOURS ?Performed at [facility], [place] [address] [place], [place] [address] ?  ? Report Status PENDING   ?Culture, blood (Routine X 2) w Reflex to ID Panel     Status: None (Preliminary result)  ? Collection Time: 07/07/21  6:23 PM  ? Specimen: BLOOD  ?Result Value Ref Range  ? Specimen Description    ?  BLOOD BLOOD RIGHT HAND ?Performed at [facility], [place] [address], [place], [place] [address] ?  ? Special Requests    ?  BOTTLES DRAWN AEROBIC ONLY Blood Culture adequate volume ?Performed at [facility], [place] [address], [place], [place] [address] ?  ?  Culture    ?  NO GROWTH < 12 HOURS ?Performed at [facility], [place] [address], [place], [place] [address] ?  ? Report Status PENDING   ?Urinalysis, Routine w reflex microscopic Urine, Suprapubic     Status: Abnormal  ? Collection Time: 07/07/21  9:45 PM  ?Result Value Ref Range  ? Color, Urine YELLOW YELLOW  ? APPearance CLOUDY (A) CLEAR  ? Specific Gravity, Urine 1.021 1.005 - 1.030  ? pH 5.0 5.0 - 8.0  ? Glucose, UA NEGATIVE NEGATIVE mg/dL  ? Hgb urine dipstick NEGATIVE NEGATIVE  ? Bilirubin Urine NEGATIVE NEGATIVE  ? Ketones, ur 5 (A) NEGATIVE mg/dL  ? Protein, ur 100 (A) NEGATIVE mg/dL  ? Nitrite NEGATIVE NEGATIVE  ? Leukocytes,Ua LARGE (A) NEGATIVE  ? WBC, UA >50 (H) 0 - 5 WBC/hpf  ? Bacteria, UA RARE (A) NONE SEEN  ? Squamous Epithelial / LPF 0-5 0 - 5  ? WBC Clumps PRESENT   ?  Mucus PRESENT   ?CBC with Differential/Platelet     Status: Abnormal  ? Collection Time: 07/08/21  8:24 AM  ?Result Value Ref Range  ? WBC 8.2 4.0 - 10.5 K/uL  ? RBC 3.51 (L) 4.22 - 5.81 MIL/uL  ? Hemoglobin 9.8 (L) 13.0 - 17.0 g/dL  ? HCT 30.9 (L) 39.0 - 52.0 %  ? MCV 88.0 80.0 - 100.0 fL  ? MCH 27.9 26.0 - 34.0 pg  ? MCHC 31.7 30.0 - 36.0 g/dL  ? RDW 15.1 11.5 - 15.5 %  ? Platelets 359 150 - 400 K/uL  ? nRBC 0.0 0.0 - 0.2 %  ? Neutrophils Relative % 53 %  ? Neutro Abs 4.3 1.7 - 7.7 K/uL  ? Lymphocytes Relative 28 %  ? Lymphs Abs 2.3 0.7 - 4.0 K/uL  ? Monocytes Relative 12 %  ? Monocytes Absolute 1.0 0.1 - 1.0 K/uL  ? Eosinophils Relative 5 %  ? Eosinophils Absolute 0.4 0.0 - 0.5 K/uL  ? Basophils Relative 1 %  ? Basophils Absolute 0.0 0.0 - 0.1 K/uL  ? Immature Granulocytes 1 %  ? Abs Immature Granulocytes 0.10 (H) 0.00 - 0.07 K/uL  ?Basic metabolic panel     Status: Abnormal  ? Collection Time: 07/08/21  8:24 AM  ?Result Value Ref Range  ? Sodium 135 135 - 145 mmol/L  ? Potassium 4.2 3.5 - 5.1 mmol/L  ? Chloride 104 98 - 111 mmol/L  ? CO2 24 22 - 32 mmol/L  ? Glucose, Bld 108 (H) 70 - 99 mg/dL  ? BUN 34 (H) 8 - 23 mg/dL  ?  Creatinine, Ser 1.07 0.61 - 1.24 mg/dL  ? Calcium 8.6 (L) 8.9 - 10.3 mg/dL  ? GFR, Estimated >60 >60 mL/min  ? Anion gap 7 5 - 15  ?Magnesium     Status: Abnormal  ? Collection Time: 07/08/21  8:24 AM  ?Result Value Ref Range

## 2021-07-08 NOTE — Progress Notes (Signed)
Occupational Therapy Treatment ?Patient Details ?Name: Andre Casey. ?MRN: [id number] ?DOB: 27-Nov-1929 ?Today's Date: 07/08/2021 ? ? ?History of present illness Andre Casey. is a 88 y.o. male with medical history significant of metastatic prostate cancer, right renal mass/pancreatic mass, physical deconditioning, urinary incontinence with indwelling suprapubic catheter, penile prosthesis implant with recent removal due to urinary retention on 05/16/21) CKD, Alzheimers, HTN, recent Covid 19 infection (04/15/21), normocytic anemia and CAD. ?  ?OT comments ? Patient is a pleasant 88 year old male who was noted to have had incontinence episode of BM with minimal awareness of incident. Patient was mod A for hygiene and transfers off commode with reports of increased pain and stiffness in L knee with standing. Patient required increased assistance for hand hygiene prior to completing oral care seated in recliner secondary to knee pain. Patient is not at a safe level in ADLs to transition home alone at this time successfully. Patient would continue to benefit from skilled OT services at this time while admitted and after d/c to address noted deficits in order to improve overall safety and independence in ADLs.  ?  ? ?Recommendations for follow up therapy are one component of a multi-disciplinary discharge planning process, led by the attending physician.  Recommendations may be updated based on patient status, additional functional criteria and insurance authorization. ?   ?Follow Up Recommendations ? Skilled nursing-short term rehab (<3 hours/day)  ?  ?Assistance Recommended at Discharge Frequent or constant Supervision/Assistance  ?Patient can return home with the following ? A little help with walking and/or transfers;A little help with bathing/dressing/bathroom;Assistance with cooking/housework;Assist for transportation;Help with stairs or ramp for entrance ?  ?Equipment Recommendations ? None  recommended by OT  ?  ?Recommendations for Other Services   ? ?  ?Precautions / Restrictions Precautions ?Precaution Comments: suprapubic catheter ?Required Braces or Orthoses: Splint/Cast ?Splint/Cast: R wrist immobilizer ?Splint/Cast - Date Prophylactic Dressing Applied (if applicable): 78/29/56 ?Restrictions ?Weight Bearing Restrictions: No  ? ? ?  ? ?Mobility Bed Mobility ?Overal bed mobility: Needs Assistance ?Bed Mobility: Supine to Sit ?  ?  ?Supine to sit: Min assist, HOB elevated ?  ?  ?General bed mobility comments: with increased time and cues for scooting to the edge ?  ? ?Transfers ?  ?  ?  ?  ?  ?  ?  ?  ?  ?  ?  ?  ?Balance Overall balance assessment: Mild deficits observed, not formally tested ?  ?  ?  ?  ?  ?  ?  ?  ?  ?  ?  ?  ?  ?  ?  ?  ?  ?  ?   ? ?ADL either performed or assessed with clinical judgement  ? ?ADL Overall ADL's : Needs assistance/impaired ?  ?  ?Grooming: Wash/dry face;Oral care;Set up;Sitting ?Grooming Details (indicate cue type and reason): patietn unable to participate in tasks in standing with L knee pain. patient was able to complete brushing teeth with MI after min A for set up of task. patient was mod A to wash hands with noted soiled nails at this time for increased hygiene while using hands prior to completing any ADL tasks. ?  ?  ?  ?  ?  ?  ?  ?  ?Toilet Transfer: Moderate assistance;Regular Toilet;Ambulation ?Toilet Transfer Details (indicate cue type and reason): patient was mod A for sit to stand from regular toilet in bathroom with increased cues and use of grab  bar. patient reporting increased pain in L knee with reports that it feels stiff. ?Toileting- Water quality scientist and Hygiene: Moderate assistance ?Toileting - Clothing Manipulation Details (indicate cue type and reason): patient was noted to have been incontint of BM in bed with patient having no awareness with increased assistance for hygiene. ?  ?  ?  ?  ?  ? ?Extremity/Trunk Assessment   ?  ?  ?  ?  ?   ? ?Vision   ?  ?  ?Perception   ?  ?Praxis   ?  ? ?Cognition Arousal/Alertness: Awake/alert ?Behavior During Therapy: [facility] for tasks assessed/performed ?  ?  ?  ?  ?  ?  ?  ?  ?  ?  ?  ?  ?  ?  ?  ?  ?  ?General Comments: patient was plesant but needed increased encouragement to participate in time out of bed. ?  ?  ?   ?Exercises   ? ?  ?Shoulder Instructions   ? ? ?  ?General Comments    ? ? ?Pertinent Vitals/ Pain       Pain Assessment ?Pain Assessment: Faces ?Faces Pain Scale: Hurts little more ?Pain Location: right wrist and L knee ?Pain Descriptors / Indicators: Discomfort, Grimacing ?Pain Intervention(s): Monitored during session, Repositioned ? ?Home Living   ?  ?  ?  ?  ?  ?  ?  ?  ?  ?  ?  ?  ?  ?  ?  ?  ?  ?  ? ?  ?Prior Functioning/Environment    ?  ?  ?  ?   ? ?Frequency ? Min 2X/week  ? ? ? ? ?  ?Progress Toward Goals ? ?OT Goals(current goals can now be found in the care plan section) ? Progress towards OT goals: Progressing toward goals ? ?   ?Plan Discharge plan remains appropriate   ? ?Co-evaluation ? ? ?   ?  ?  ?  ?  ? ?  ?AM-PAC OT "6 Clicks" Daily Activity     ?Outcome Measure ? ? Help from another person eating meals?: A Little ?Help from another person taking care of personal grooming?: A Little ?Help from another person toileting, which includes using toliet, bedpan, or urinal?: A Lot ?Help from another person bathing (including washing, rinsing, drying)?: A Little ?Help from another person to put on and taking off regular upper body clothing?: A Little ?Help from another person to put on and taking off regular lower body clothing?: A Little ?6 Click Score: 17 ? ?  ?End of Session Equipment Utilized During Treatment: Gait belt;Rolling walker (2 wheels) ? ?OT Visit Diagnosis: Pain;Muscle weakness (generalized) (M62.81) ?Pain - Right/Left: Left ?Pain - part of body: Knee ?  ?Activity Tolerance Patient tolerated treatment well ?  ?Patient Left in chair;with call bell/phone within reach;with  chair alarm set ?  ?Nurse Communication Mobility status ?  ? ?   ? ?Time: 6568-1275 ?OT Time Calculation (min): 26 min ? ?Charges: OT General Charges ?$OT Visit: 1 Visit ?OT Treatments ?$Self Care/Home Management : 23-37 mins ? ?Nanea Jared OTR/L, MS ?Acute Rehabilitation Department ?Office# [phone] ?Pager# [phone] ? ? ?[place] ?07/08/2021, 3:53 PM ?

## 2021-07-08 NOTE — Care Management Important Message (Signed)
Important Message ? ?Patient Details IM Letter placed in Patients room. ?Name: Andre Casey. ?MRN: [id number] ?Date of Birth: 18-Dec-1929 ? ? ?Medicare Important Message Given:  Yes ? ? ? ? ?Kerin Salen ?07/08/2021, 1:24 PM ?

## 2021-07-09 DIAGNOSIS — R5381 Other malaise: Secondary | ICD-10-CM | POA: Diagnosis not present

## 2021-07-09 LAB — CBC WITH DIFFERENTIAL/PLATELET
Abs Immature Granulocytes: 0.1 10*3/uL — ABNORMAL HIGH (ref 0.00–0.07)
Basophils Absolute: 0.1 10*3/uL (ref 0.0–0.1)
Basophils Relative: 1 %
Eosinophils Absolute: 0.5 10*3/uL (ref 0.0–0.5)
Eosinophils Relative: 5 %
HCT: 31.4 % — ABNORMAL LOW (ref 39.0–52.0)
Hemoglobin: 9.4 g/dL — ABNORMAL LOW (ref 13.0–17.0)
Immature Granulocytes: 1 %
Lymphocytes Relative: 25 %
Lymphs Abs: 2.2 10*3/uL (ref 0.7–4.0)
MCH: 26.9 pg (ref 26.0–34.0)
MCHC: 29.9 g/dL — ABNORMAL LOW (ref 30.0–36.0)
MCV: 90 fL (ref 80.0–100.0)
Monocytes Absolute: 1.3 10*3/uL — ABNORMAL HIGH (ref 0.1–1.0)
Monocytes Relative: 15 %
Neutro Abs: 4.8 10*3/uL (ref 1.7–7.7)
Neutrophils Relative %: 53 %
Platelets: 376 10*3/uL (ref 150–400)
RBC: 3.49 MIL/uL — ABNORMAL LOW (ref 4.22–5.81)
RDW: 14.9 % (ref 11.5–15.5)
WBC: 8.9 10*3/uL (ref 4.0–10.5)
nRBC: 0 % (ref 0.0–0.2)

## 2021-07-09 LAB — BASIC METABOLIC PANEL
Anion gap: 8 (ref 5–15)
BUN: 33 mg/dL — ABNORMAL HIGH (ref 8–23)
CO2: 25 mmol/L (ref 22–32)
Calcium: 8.5 mg/dL — ABNORMAL LOW (ref 8.9–10.3)
Chloride: 102 mmol/L (ref 98–111)
Creatinine, Ser: 0.93 mg/dL (ref 0.61–1.24)
GFR, Estimated: >60 mL/min (ref >60)
Glucose, Bld: 101 mg/dL — ABNORMAL HIGH (ref 70–99)
Potassium: 4 mmol/L (ref 3.5–5.1)
Sodium: 135 mmol/L (ref 135–145)

## 2021-07-09 LAB — URINE CULTURE: Culture: 50000 — AB

## 2021-07-09 LAB — MAGNESIUM: Magnesium: 2.3 mg/dL (ref 1.7–2.4)

## 2021-07-09 NOTE — Progress Notes (Signed)
Physical Therapy Treatment ?Patient Details ?Name: Andre Casey. ?MRN: [id number] ?DOB: December 26, 1929 ?Today's Date: 07/09/2021 ? ? ?History of Present Illness Cayden Granholm. is a 88 y.o. male with medical history significant of metastatic prostate cancer, right renal mass/pancreatic mass, physical deconditioning, urinary incontinence with indwelling suprapubic catheter, penile prosthesis implant with recent removal due to urinary retention on 05/16/21) CKD, Alzheimers, HTN, recent Covid 19 infection (04/15/21), normocytic anemia and CAD. ? ?  ?PT Comments  ? ? 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  Has a Graddaughter who helps with groceries. ?Assisted OOB to amb was difficult.  General bed mobility comments: required increased assist this session esp with difficulty scooting to EOB and required increased assist to support B LE up onto bed. General transfer comment: required increased assist even from elevated bed and even more assist off lower level toilet.  "My knees are weak" stated pt.  During stand to sit onto toilet, pt's knees buckled and pt "plop" onto toilet seat. General Gait Details: limited amb distance to and from bathroom due to c/o weak B LE and general fatigue.  Very unsteady gait with L knee instability and poor flex posture.  HIGH FALL RISK. ?Pt will need ST Rehab at SNF prior to safely returning home.   ?Recommendations for follow up therapy are one component of a multi-disciplinary discharge planning process, led by the attending physician.  Recommendations may be updated based on patient status, additional functional criteria and insurance authorization. ? ?Follow Up Recommendations ? Skilled nursing-short term rehab (<3 hours/day) ?  ?  ?Assistance Recommended at Discharge Frequent or constant Supervision/Assistance  ?Patient can return home with the following A little  help with walking and/or transfers;A little help with bathing/dressing/bathroom;Assist for transportation;Help with stairs or ramp for entrance;Assistance with cooking/housework ?  ?Equipment Recommendations ? None recommended by PT  ?  ?Recommendations for Other Services   ? ? ?  ?Precautions / Restrictions Precautions ?Precautions: Fall ?Precaution Comments: suprapubic catheter, "bad" left knee, "bad" right wrist ?Required Braces or Orthoses: Splint/Cast ?Splint/Cast: R wrist immobilizer ?Restrictions ?Weight Bearing Restrictions: No  ?  ? ?Mobility ? Bed Mobility ?Overal bed mobility: Needs Assistance ?Bed Mobility: Supine to Sit, Sit to Supine ?  ?  ?Supine to sit: Mod assist ?Sit to supine: Mod assist, Max assist ?  ?General bed mobility comments: required increased assist this session esp with difficulty scooting to EOB and required increased assist to support B LE up onto bed. ?  ? ?Transfers ?Overall transfer level: Needs assistance ?Equipment used: Rolling walker (2 wheels) ?Transfers: Sit to/from Stand ?Sit to Stand: Max assist, Mod assist ?  ?  ?  ?  ?  ?General transfer comment: required increased assist even from elevated bed and even more assist off lower level toilet.  "My knees are weak" stated pt.  During stand to sit onto toilet, pt's knees buckled and pt "plop" onto toilet seat. ?  ? ?Ambulation/Gait ?Ambulation/Gait assistance: Min assist, Mod assist ?Gait Distance (Feet): 24 Feet (12 feet x 2 to and from bathroom) ?Assistive device: Rolling walker (2 wheels) ?Gait Pattern/deviations: Step-through pattern, Decreased stride length ?Gait velocity: decreased ?  ?  ?General Gait Details: limited amb distance to and from bathroom due to c/o weak B LE and general fatigue.  Very unsteady gait with L knee instability and poor flex posture.  HIGH FALL RISK. ? ? ?  Stairs ?  ?  ?  ?  ?  ? ? ?Wheelchair Mobility ?  ? ?Modified Rankin (Stroke Patients Only) ?  ? ? ?  ?Balance   ?  ?  ?  ?  ?  ?  ?  ?  ?  ?  ?   ?  ?  ?  ?  ?  ?  ?  ?  ? ?  ?Cognition Arousal/Alertness: Awake/alert ?Behavior During Therapy: [facility] for tasks assessed/performed ?Overall Cognitive Status: Within Functional Limits for tasks assessed ?  ?  ?  ?  ?  ?  ?  ?  ?  ?  ?  ?  ?  ?  ?  ?  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  Has a Graddaughter who helps with groceries. ?  ?  ? ?  ?Exercises   ? ?  ?General Comments   ?  ?  ? ?Pertinent Vitals/Pain Pain Assessment ?Pain Assessment: Faces ?Faces Pain Scale: Hurts little more ?Pain Location: right wrist and L knee ?Pain Descriptors / Indicators: Discomfort, Grimacing ?Pain Intervention(s): Monitored during session, Repositioned  ? ? ?Home Living   ?  ?  ?  ?  ?  ?  ?  ?  ?  ?   ?  ?Prior Function    ?  ?  ?   ? ?PT Goals (current goals can now be found in the care plan section) Progress towards PT goals: Progressing toward goals ? ?  ?Frequency ? ? ? Min 2X/week ? ? ? ?  ?PT Plan Current plan remains appropriate  ? ? ?Co-evaluation   ?  ?  ?  ?  ? ?  ?AM-PAC PT "6 Clicks" Mobility   ?Outcome Measure ? Help needed turning from your back to your side while in a flat bed without using bedrails?: A Lot ?Help needed moving from lying on your back to sitting on the side of a flat bed without using bedrails?: A Lot ?Help needed moving to and from a bed to a chair (including a wheelchair)?: A Lot ?Help needed standing up from a chair using your arms (e.g., wheelchair or bedside chair)?: A Lot ?Help needed to walk in hospital room?: A Lot ?Help needed climbing 3-5 steps with a railing? : Total ?6 Click Score: 11 ? ?  ?End of Session Equipment Utilized During Treatment: Gait belt ?Activity Tolerance: Patient limited by fatigue ?Patient left: in bed;with call bell/phone within reach;with bed alarm set ?Nurse Communication: Mobility status ?PT Visit Diagnosis: Unsteadiness on feet (R26.81);Muscle weakness  (generalized) (M62.81) ?  ? ? ?Time: 4268-3419 ?PT Time Calculation (min) (ACUTE ONLY): 26 min ? ?Charges:  $Gait Training: 8-22 mins ?$Therapeutic Activity: 8-22 mins          ?          ? ?{Maddock Finigan  PTA ?Acute  Rehabilitation Services ?Pager      [phone] ?Office      [phone] ? ?

## 2021-07-09 NOTE — Progress Notes (Signed)
?Progress Note ? ? ? ?Andre Casey.   ?AJG:[id number]  ?DOB: Jan 15, 1930  ?DOA: 07/03/2021     5 ?PCP: [name], MD ? ?Initial CC: AMS ? ?Hospital Course: ?Andre Casey is an 88 yo male with PMH CAD, HTN, HLD, arthritis, metastatic prostate cancer, right renal mass/pancreatic mass, physical deconditioning, chronic suprapubic catheter who presented with altered mentation and elevated blood pressure. ?There was concern for UTI and he was started on antibiotics and admitted for further work-up.  He also had been wearing a splint around his right wrist due to swelling and pain. ? ?Interval History:  ?No events overnight.  Right wrist is actually feeling a little better even this morning he says.  He has a little bit more range of motion.  Otherwise no fevers and continues to feel okay.  Awaiting rehab placement. ? ?Assessment and Plan: ?UTI (urinary tract infection) due to urinary indwelling Foley catheter ([place]) ?- Patient had reported foul-smelling urine with red tinge.  He was started on antibiotics for empiric coverage. ?- Follow-up urine culture (insignificant growth), will complete an empiric 5 day course of cefepime (end date 5/14) ?-Suprapubic catheter exchanged on 07/05/2021 ?-Patient developed a fever on 07/07/2021.  He has no leukocytosis and fever has defervesced.  Urinalysis collected does show some evidence of possible infection although he is certainly colonized to some degree. He remained stable/non-toxic with no further fevers and no leukocytosis therefore no further abx initiated after previous course ? ?Right wrist pain ?- Afebrile with very mild leukocytosis on admission (10.7).  He has been wearing a splint due to pain and difficulty with ROM.  Ultrasound performed shows severe synovitis with effusion and complex fluid collection measuring 3.4 x 1.7 x 2.1 cm. This is favored to be inflammatory in nature. Uric acid is 4.9.  ?-WBC normalized after admission ?-Okay to DC  vancomycin ?-Aspiration performed on 07/05/2021 yielding 1 cc straw-colored fluid.  Possibly not enough for ordered testing but follow-up culture ?-Cultures remain negative ?- Continue pain control and supportive care ? ?Physical deconditioning ?- PT/OT recommending SNF.  Discussed with his granddaughter as well and all are amenable for rehab at discharge ?-Awaiting placement ? ?Prostate cancer metastatic to multiple sites [facility]) ?- Followed by urology at [facility] ?- Appreciate palliative care evaluation ? ?Moderate late onset Alzheimer's dementia ([place]) ?- At baseline ? ?Hypokalemia ?- Replete as needed ? ?Essential hypertension ?- Blood pressure above goal ?- Resuming home medications ? ?Pancreatic mass ?- noted and followed by oncology  ? ?Atherosclerotic heart disease of native coronary artery with angina pectoris ([place]) ?- Continue aspirin and Coreg ? ?Urethral stricture ?- Continue suprapubic Foley; exchanging on 5/12 ? ? ?Old records reviewed in assessment of this patient ? ?Antimicrobials: ?Vanc 5/10 x 1 ?Cefepime 5/10 >> 07/07/2021 ? ? ?DVT prophylaxis:  ?SCDs Start: 07/03/21 2217 ? ? ?Code Status:   Code Status: DNR ? ?Disposition Plan: SNF ?Status is: Inpt ? ?Objective: ?Blood pressure (!) 112/59, pulse 63, temperature 99 ?F (37.2 ?C), temperature source Oral, resp. rate (!) 24, height '5\' 7"'$  (1.702 m), weight 58 kg, SpO2 96 %.  ?Examination:  ?Physical Exam ?Constitutional:   ?   General: He is not in acute distress. ?HENT:  ?   Head: Normocephalic and atraumatic.  ?   Mouth/Throat:  ?   Mouth: Mucous membranes are moist.  ?Eyes:  ?   Extraocular Movements: Extraocular movements intact.  ?Cardiovascular:  ?   Rate and Rhythm: Normal rate and regular rhythm.  ?  Heart sounds: Normal heart sounds.  ?Pulmonary:  ?   Effort: Pulmonary effort is normal. No respiratory distress.  ?   Breath sounds: Normal breath sounds. No wheezing.  ?Abdominal:  ?   General: Bowel sounds are normal.  ?   Palpations: Abdomen is  soft.  ?   Tenderness: There is no abdominal tenderness.  ?   Comments: Suprapubic catheter in place  ?Musculoskeletal:  ?   Cervical back: Normal range of motion and neck supple.  ?   Comments: Right wrist noted with improved TTP; no significant edema  ?Skin: ?   General: Skin is warm and dry.  ?Neurological:  ?   Mental Status: He is alert. Mental status is at baseline.  ?   Comments: Follows commands and moves all 4 extremities easily  ?Psychiatric:     ?   Mood and Affect: Mood normal.  ?  ? ?Consultants:  ?Palliative care ?Hand surgery ? ?Procedures:  ?Suprapubic catheter exchange, 07/05/2021 ? ?Data Reviewed: ?Results for orders placed or performed during the hospital encounter of 07/03/21 (from the past 24 hour(s))  ?Basic metabolic panel     Status: Abnormal  ? Collection Time: 07/09/21  3:44 AM  ?Result Value Ref Range  ? Sodium 135 135 - 145 mmol/L  ? Potassium 4.0 3.5 - 5.1 mmol/L  ? Chloride 102 98 - 111 mmol/L  ? CO2 25 22 - 32 mmol/L  ? Glucose, Bld 101 (H) 70 - 99 mg/dL  ? BUN 33 (H) 8 - 23 mg/dL  ? Creatinine, Ser 0.93 0.61 - 1.24 mg/dL  ? Calcium 8.5 (L) 8.9 - 10.3 mg/dL  ? GFR, Estimated >60 >60 mL/min  ? Anion gap 8 5 - 15  ?CBC with Differential/Platelet     Status: Abnormal  ? Collection Time: 07/09/21  3:44 AM  ?Result Value Ref Range  ? WBC 8.9 4.0 - 10.5 K/uL  ? RBC 3.49 (L) 4.22 - 5.81 MIL/uL  ? Hemoglobin 9.4 (L) 13.0 - 17.0 g/dL  ? HCT 31.4 (L) 39.0 - 52.0 %  ? MCV 90.0 80.0 - 100.0 fL  ? MCH 26.9 26.0 - 34.0 pg  ? MCHC 29.9 (L) 30.0 - 36.0 g/dL  ? RDW 14.9 11.5 - 15.5 %  ? Platelets 376 150 - 400 K/uL  ? nRBC 0.0 0.0 - 0.2 %  ? Neutrophils Relative % 53 %  ? Neutro Abs 4.8 1.7 - 7.7 K/uL  ? Lymphocytes Relative 25 %  ? Lymphs Abs 2.2 0.7 - 4.0 K/uL  ? Monocytes Relative 15 %  ? Monocytes Absolute 1.3 (H) 0.1 - 1.0 K/uL  ? Eosinophils Relative 5 %  ? Eosinophils Absolute 0.5 0.0 - 0.5 K/uL  ? Basophils Relative 1 %  ? Basophils Absolute 0.1 0.0 - 0.1 K/uL  ? Immature Granulocytes 1 %  ? Abs  Immature Granulocytes 0.10 (H) 0.00 - 0.07 K/uL  ?Magnesium     Status: None  ? Collection Time: 07/09/21  3:44 AM  ?Result Value Ref Range  ? Magnesium 2.3 1.7 - 2.4 mg/dL  ?  ?I have Reviewed nursing notes, Vitals, and Lab results since pt's last encounter. Pertinent lab results : see above ?I have ordered test including BMP, CBC, Mg ?I have reviewed the last note from staff over past 24 hours ?I have discussed pt's care plan and test results with nursing staff, case manager ? ? LOS: 5 days  ? ?Dwyane Dee, MD ?[facility] ?07/09/2021, 2:33 PM ?

## 2021-07-09 NOTE — TOC Progression Note (Signed)
Transition of Care (TOC) - Progression Note  ? ? ?Patient Details  ?Name: Andre Casey. ?MRN: [id number] ?Date of Birth: 1929-03-04 ? ?Transition of Care (TOC) CM/SW Contact  ?Purcell Mouton, RN ?Phone Number: ?07/09/2021, 3:19 PM ? ?Clinical Narrative:    ? ?[facility] was selected. Insurance approved pt to trans on 5/17 to SNF. ? ?Expected Discharge Plan: [place] ?Barriers to Discharge: No Barriers Identified ? ?Expected Discharge Plan and Services ?Expected Discharge Plan: [place] ?  ?  ?Post Acute Care Choice: [place] ?Living arrangements for the past 2 months: [place] ?                ?  ?  ?  ?  ?  ?  ?  ?  ?  ?  ? ? ?Social Determinants of Health (SDOH) Interventions ?  ? ?Readmission Risk Interventions ?   ? View : No data to display.  ?  ?  ?  ? ? ?

## 2021-07-10 DIAGNOSIS — T83510A Infection and inflammatory reaction due to cystostomy catheter, initial encounter: Secondary | ICD-10-CM | POA: Diagnosis not present

## 2021-07-10 LAB — CBC WITH DIFFERENTIAL/PLATELET
Abs Immature Granulocytes: 0.14 10*3/uL — ABNORMAL HIGH (ref 0.00–0.07)
Basophils Absolute: 0.1 10*3/uL (ref 0.0–0.1)
Basophils Relative: 1 %
Eosinophils Absolute: 0.4 10*3/uL (ref 0.0–0.5)
Eosinophils Relative: 5 %
HCT: 32.7 % — ABNORMAL LOW (ref 39.0–52.0)
Hemoglobin: 10.1 g/dL — ABNORMAL LOW (ref 13.0–17.0)
Immature Granulocytes: 1 %
Lymphocytes Relative: 22 %
Lymphs Abs: 2.2 10*3/uL (ref 0.7–4.0)
MCH: 27.3 pg (ref 26.0–34.0)
MCHC: 30.9 g/dL (ref 30.0–36.0)
MCV: 88.4 fL (ref 80.0–100.0)
Monocytes Absolute: 1.2 10*3/uL — ABNORMAL HIGH (ref 0.1–1.0)
Monocytes Relative: 12 %
Neutro Abs: 5.8 10*3/uL (ref 1.7–7.7)
Neutrophils Relative %: 59 %
Platelets: 431 10*3/uL — ABNORMAL HIGH (ref 150–400)
RBC: 3.7 MIL/uL — ABNORMAL LOW (ref 4.22–5.81)
RDW: 14.7 % (ref 11.5–15.5)
WBC: 9.8 10*3/uL (ref 4.0–10.5)
nRBC: 0 % (ref 0.0–0.2)

## 2021-07-10 LAB — BASIC METABOLIC PANEL
Anion gap: 8 (ref 5–15)
BUN: 32 mg/dL — ABNORMAL HIGH (ref 8–23)
CO2: 25 mmol/L (ref 22–32)
Calcium: 8.8 mg/dL — ABNORMAL LOW (ref 8.9–10.3)
Chloride: 105 mmol/L (ref 98–111)
Creatinine, Ser: 0.97 mg/dL (ref 0.61–1.24)
GFR, Estimated: >60 mL/min (ref >60)
Glucose, Bld: 119 mg/dL — ABNORMAL HIGH (ref 70–99)
Potassium: 4.5 mmol/L (ref 3.5–5.1)
Sodium: 138 mmol/L (ref 135–145)

## 2021-07-10 LAB — MAGNESIUM: Magnesium: 2.6 mg/dL — ABNORMAL HIGH (ref 1.7–2.4)

## 2021-07-10 MED ORDER — POLYETHYLENE GLYCOL 3350 17 G PO PACK
17.0000 g | PACK | Freq: Every day | ORAL | 0 refills | Status: DC
Start: 2021-07-11 — End: 2022-04-25

## 2021-07-10 NOTE — Plan of Care (Signed)
  Problem: Clinical Measurements: Goal: Ability to avoid or minimize complications of infection will improve Outcome: Adequate for Discharge   Problem: Skin Integrity: Goal: Skin integrity will improve Outcome: Adequate for Discharge   Problem: Education: Goal: Knowledge of General Education information will improve Description: Including pain rating scale, medication(s)/side effects and non-pharmacologic comfort measures Outcome: Adequate for Discharge   Problem: Health Behavior/Discharge Planning: Goal: Ability to manage health-related needs will improve Outcome: Adequate for Discharge   Problem: Clinical Measurements: Goal: Ability to maintain clinical measurements within normal limits will improve Outcome: Adequate for Discharge Goal: Will remain free from infection Outcome: Adequate for Discharge Goal: Diagnostic test results will improve Outcome: Adequate for Discharge Goal: Respiratory complications will improve Outcome: Adequate for Discharge Goal: Cardiovascular complication will be avoided Outcome: Adequate for Discharge   Problem: Activity: Goal: Risk for activity intolerance will decrease Outcome: Adequate for Discharge   Problem: Nutrition: Goal: Adequate nutrition will be maintained Outcome: Adequate for Discharge   Problem: Coping: Goal: Level of anxiety will decrease Outcome: Adequate for Discharge   Problem: Elimination: Goal: Will not experience complications related to bowel motility Outcome: Adequate for Discharge Goal: Will not experience complications related to urinary retention Outcome: Adequate for Discharge   Problem: Pain Managment: Goal: General experience of comfort will improve Outcome: Adequate for Discharge   Problem: Safety: Goal: Ability to remain free from injury will improve Outcome: Adequate for Discharge   Problem: Skin Integrity: Goal: Risk for impaired skin integrity will decrease Outcome: Adequate for Discharge   

## 2021-07-10 NOTE — Progress Notes (Addendum)
[facility] here to transport patient to [facility].  All belongings sent with patient.  Room double checked.  All questions and concerns addressed.  Will call son Hristopher Missildine) and make him aware.  Son, [place] in [place]. Notified and spoke with directly.  ?

## 2021-07-10 NOTE — TOC Progression Note (Signed)
Transition of Care (TOC) - Progression Note  ? ? ?Patient Details  ?Name: Andre Casey. ?MRN: [id number] ?Date of Birth: 1929/11/15 ? ?Transition of Care (TOC) CM/SW Contact  ?Purcell Mouton, RN ?Phone Number: ?07/10/2021, 3:05 PM ? ?Clinical Narrative:    ? ?A call to pt's son [name] and granddaughter Thad Ranger was made to inform them that pt would transport to [facility] today. PTAR was called.  ? ?Expected Discharge Plan: [place] ?Barriers to Discharge: No Barriers Identified ? ?Expected Discharge Plan and Services ?Expected Discharge Plan: [place] ?  ?  ?Post Acute Care Choice: [place] ?Living arrangements for the past 2 months: [place] ?Expected Discharge Date: 07/10/21               ?  ?  ?  ?  ?  ?  ?  ?  ?  ?  ? ? ?Social Determinants of Health (SDOH) Interventions ?  ? ?Readmission Risk Interventions ?   ? View : No data to display.  ?  ?  ?  ? ? ?

## 2021-07-10 NOTE — Assessment & Plan Note (Signed)
Noted, follow outpatient ?

## 2021-07-10 NOTE — Discharge Summary (Addendum)
Physician Discharge Summary  ?Andre Casey. RDE:[id number] DOB: 06-May-1929 DOA: 07/03/2021 ? ?PCP: [name], MD ? ?Admit date: 07/03/2021 ?Discharge date: 07/10/2021 ? ?Time spent: 40 minutes ? ?Recommendations for Outpatient Follow-up:  ?Follow outpatient CBC/CMP  ?Follow with orthopedics outpatient for right wrist swelling/effusion - Dr. Caralyn Guile at  [facility] [phone]) ?Follow with urology outpatient for presumed UTI as well as his prostate cancer with mets and renal mass ?Follow new sclerotic focus within L4 as well as 2.5 cm right renal mass and pancreatic hypodensity outpatient  ? ?Discharge Diagnoses:  ?Active Problems: ?  Infection of bladder catheter ([place]) ?  Right wrist pain ?  Prostate cancer metastatic to multiple sites [facility]) ?  Physical deconditioning ?  Urethral stricture ?  Moderate late onset Alzheimer's dementia ([place]) ?  Atherosclerotic heart disease of native coronary artery with angina pectoris ([place]) ?  Protein-calorie malnutrition, severe ?  Pancreatic mass ?  Essential hypertension ?  Hypokalemia ? ? ?Discharge Condition: stable ? ?Diet recommendation: heart healthy ? ?Filed Weights  ? 07/04/21 0900  ?Weight: 58 kg  ? ? ?History of present illness:  ?Andre Casey is an 88 yo male with PMH CAD, HTN, HLD, arthritis, metastatic prostate cancer, right renal mass/pancreatic mass, physical deconditioning, chronic suprapubic catheter who presented with altered mentation and elevated blood pressure. ? ?There was concern for UTI and he was started on antibiotics and admitted for further work-up.  He also had been wearing Andre Casey around his right wrist due to swelling and pain.  His urine culture was insignificant growth, but he was treated empirically with 5 days of cefepime.  For his right wrist swelling, he was seen by orthopedics and his right wrist was aspirated.  His wrist culture is no growth, but no cell count or crystal analysis done.  His wrist tenderness has gradually  improved.  Recommending outpatient follow up with orthopedics as well as urology. ? ?See below for additional details ? ?Hospital Course:  ?Assessment and Plan: ?Infection of bladder catheter ([place]) ?- Patient had reported foul-smelling urine with red tinge.  He was started on antibiotics for empiric coverage. ?- Follow-up urine culture (insignificant growth).  Repeat culture had 50,000 yeast.  He's completed 5 days of cefepime.  Will hold off on treatment of yeast, suspect this is more likely contaminant.  Follow outpatient.   ?-Suprapubic catheter exchanged on 07/05/2021 ?-Patient developed Andre Casey fever on 07/07/2021.  He has no leukocytosis and fever has defervesced.  Repeat UA/Cx showed yeast as above. He remained stable/non-toxic with no further fevers and no leukocytosis therefore no further anti infectives initiated after previous course ? ?Right wrist pain ?- Afebrile with very mild leukocytosis on admission (10.7).  He has been wearing Andre Casey due to pain and difficulty with ROM.  Ultrasound performed shows severe synovitis with effusion and complex fluid collection measuring 3.4 x 1.7 x 2.1 cm. This is favored to be inflammatory arthritis/CPPD arthropathy (infection thought less likely). Uric acid is 4.9.  ?- 5/10 plain film with widened scapholunate interval with SLAC configuration of wrist ?-WBC normalized after admission ?-Okay to DC vancomycin ?-Aspiration performed on 07/05/2021 yielding 1 cc straw-colored fluid.   ?- culture no growth, no organisms seen ?- no crystal analysis or cell count done ?- needs outpatient orthopedics follow up with Andre Casey  ?- seen by ortho, didn't think c/w septic arthritis or tenosynovitis, Casey for comfort, recommended aspiration by IR ? ?Physical deconditioning ?- PT/OT recommending SNF.   ?-Awaiting placement ? ?  Prostate cancer metastatic to multiple sites [facility]) ?- Followed by urology at [facility] ?- CT with new sclerotic focus within posterior L4 vertebra concerning for  metastatic disease, also 2.5 cm right renal mass concerning for RCC, stable pancreatic head hypodensity ?- Appreciate palliative care evaluation ? ?Moderate late onset Alzheimer's dementia ([place]) ?- At baseline ? ?Urethral stricture ?- Continue suprapubic Foley; exchanging on 5/12 ?- flush foley as needed ? ?Protein-calorie malnutrition, severe ?Noted, follow outpatient ? ?Atherosclerotic heart disease of native coronary artery with angina pectoris ([place]) ?- Continue aspirin and Coreg ? ?Pancreatic mass ?- noted and followed by oncology  ? ?Essential hypertension ?Continue home meds ? ?Hypokalemia ?- Replete as needed ? ? ? ? ?Procedures: ?5/12 ?IMPRESSION: ?Technically successful ultrasound-guided aspiration of right dorsal ?wrist yielding trace synovial fluid. Size of fluid collection has ?significantly decreased from recent comparison  ? ?Consultations: ?Orthopedics ?IR ? ?Discharge Exam: ?Vitals:  ? 07/09/21 2115 07/10/21 0618  ?BP: (!) 114/57 (!) 146/67  ?Pulse: 77 (!) 59  ?Resp: 15 18  ?Temp: 98.4 ?F (36.9 ?C) 98.7 ?F (37.1 ?C)  ?SpO2: 96% 95%  ? ?No complaints ?Feels well ? ?General: No acute distress. ?Cardiovascular: RRR ?Lungs: unlabored ?Abdomen: Soft, nontender, nondistended - suprapubic catheter - RN called regarding tenderness after my initial examination -> mild TTP noted around catheter, no concerning redness, will continue to monitor outpatient as planned.  ?Neurological: Alert. Moves all extremities ?4. Cranial nerves II through XII grossly intact. ?Skin: Warm and dry. No rashes or lesions. ?Extremities: No clubbing or cyanosis. No edema. ? ?Discharge Instructions ? ? ?Discharge Instructions   ? ? Call MD for:  difficulty breathing, headache or visual disturbances   Complete by: As directed ?  ? Call MD for:  extreme fatigue   Complete by: As directed ?  ? Call MD for:  hives   Complete by: As directed ?  ? Call MD for:  persistant dizziness or light-headedness   Complete by: As directed ?  ? Call MD  for:  persistant nausea and vomiting   Complete by: As directed ?  ? Call MD for:  redness, tenderness, or signs of infection (pain, swelling, redness, odor or green/yellow discharge around incision site)   Complete by: As directed ?  ? Call MD for:  severe uncontrolled pain   Complete by: As directed ?  ? Call MD for:  temperature >100.4   Complete by: As directed ?  ? Diet - low sodium heart healthy   Complete by: As directed ?  ? Discharge instructions   Complete by: As directed ?  ? You were seen for Andre Casey UTI (urine infection).  There was no definitive growth from your urine (yeast grew in Benzion Mesta subsequent culture, but this is often Dalan Cowger contaminant).  You completed 5 days of antibiotics with cefepime and your suprapubic catheter was exchanged.  Follow outpatient for new signs or symptoms of infection. ? ?You also had right wrist pain.  The aspiration did not have any evidence of infection.  In the future, if this is aspirated, it should be checked for crystals (to help rule out gout and pseudogout).  You should follow up with orthopedics as an outpatient. ? ?The CT scan showed Andre Casey sclerotic focus at L4 concerning for metastatic disease.  You also had Andre Casey 2.5 cm right renal mass concerning for renal cell carcinoma.   ? ?You should follow up with your PCP, urologist, and orthopedist.  ? ?Return for new, recurrent, or worsening symptoms. ? ?  Please ask your PCP to request records from this hospitalization so they know what was done and what the next steps will be.  ? Increase activity slowly   Complete by: As directed ?  ? ?  ? ?Allergies as of 07/10/2021   ? ?   Reactions  ? Aricept [donepezil] Nausea Only  ? Report upset stomach - unable to tolerate.  ? Namzaric [memantine Hcl-donepezil Hcl] Other (See Comments)  ? Stomach upset  ?Pt able to take Memantine by itself.  ? ?  ? ?  ?Medication List  ?  ? ?STOP taking these medications   ? ?carvedilol 6.25 MG tablet ?Commonly known as: COREG ?  ? ?  ? ?TAKE these medications    ? ?acetaminophen 500 MG tablet ?Commonly known as: TYLENOL ?Take 1,000 mg by mouth daily with lunch. ?  ?amLODipine 2.5 MG tablet ?Commonly known as: NORVASC ?Take 2.5 mg by mouth at bedtime. ?  ?aspirin EC 81

## 2021-07-10 NOTE — Progress Notes (Signed)
PIV and cardiac monitoring removed.  Report given to nurse Claiborne Billings at [facility].  Discharge paperwork printed and ready for [facility]AR upon pickup. ? ?Angie Fava, RN  ?

## 2021-07-10 NOTE — Progress Notes (Signed)
Occupational Therapy Treatment ?Patient Details ?Name: Andre Casey. ?MRN: [id number] ?DOB: [birth year] ?Today's Date: 07/10/2021 ? ? ?History of present illness Andre Casey. is a 88 y.o. male with medical history significant of metastatic prostate cancer, right renal mass/pancreatic mass, physical deconditioning, urinary incontinence with indwelling suprapubic catheter, penile prosthesis implant with recent removal due to urinary retention on 05/16/21) CKD, Alzheimers, HTN, recent Covid 19 infection (04/15/21), normocytic anemia and CAD. ?  ?OT comments ? Patient up in chair and needing to have a BM when therapist entered the room. He required min assist to power up from recliner and toilet today but otherwise min guard with walker. He exhibits a more flexed posture compared to when therapist saw him last. He needed min assistance to don mesh underwear and able to stand at sink to wah his hands and clean his dentures. Still recommend short term rehab at discharge to maximize his strength, safety and independence prior to return home.  ? ?Recommendations for follow up therapy are one component of a multi-disciplinary discharge planning process, led by the attending physician.  Recommendations may be updated based on patient status, additional functional criteria and insurance authorization. ?   ?Follow Up Recommendations ? Skilled nursing-short term rehab (<3 hours/day)  ?  ?Assistance Recommended at Discharge Frequent or constant Supervision/Assistance  ?Patient can return home with the following ? A little help with walking and/or transfers;A little help with bathing/dressing/bathroom;Assistance with cooking/housework;Assist for transportation;Help with stairs or ramp for entrance ?  ?Equipment Recommendations ? Other (comment) (Defer to next venue)  ?  ?Recommendations for Other Services   ? ?  ?Precautions / Restrictions Precautions ?Precautions: Fall ?Precaution Comments: suprapubic catheter,  "bad" left knee, "bad" right wrist, incontinent of BM ?Required Braces or Orthoses: Splint/Cast ?Splint/Cast: R wrist immobilizer ?Splint/Cast - Date Prophylactic Dressing Applied (if applicable): 17/40/81 ?Restrictions ?Weight Bearing Restrictions: No  ? ? ?  ? ?Mobility Bed Mobility ?  ?  ?  ?  ?  ?  ?  ?  ?  ? ?Transfers ?  ?  ?  ?  ?  ?  ?  ?  ?  ?  ?  ?  ?Balance Overall balance assessment: Needs assistance ?Sitting-balance support: No upper extremity supported ?Sitting balance-Leahy Scale: Good ?  ?  ?Standing balance support: During functional activity ?Standing balance-Leahy Scale: Fair ?Standing balance comment: can take hands off of walker for ADLs ?  ?  ?  ?  ?  ?  ?  ?  ?  ?  ?  ?   ? ?ADL either performed or assessed with clinical judgement  ? ?ADL Overall ADL's : Needs assistance/impaired ?  ?  ?Grooming: Oral care;Supervision/safety;Standing;Wash/dry hands ?Grooming Details (indicate cue type and reason): standing at sink ?  ?  ?  ?  ?  ?  ?Lower Body Dressing: Minimal assistance ?Lower Body Dressing Details (indicate cue type and reason): min assis to don mesh underwear ?Toilet Transfer: Rolling walker (2 wheels);Grab bars;Regular Toilet;Minimal assistance ?Toilet Transfer Details (indicate cue type and reason): min assist to power up from toilet but transferred on to toilet with min guard ?Toileting- Clothing Manipulation and Hygiene: Supervision/safety;Sitting/lateral lean ?Toileting - Clothing Manipulation Details (indicate cue type and reason): able to wipe himself with leaning, increased time for quality care ?  ?  ?Functional mobility during ADLs: Minimal assistance;Rolling walker (2 wheels) ?General ADL Comments: Patient up in chair when therapist entered room. Min assist to power up from recliner  and toilet. Min assist to don underwear and supervision for toileting. Able to stand at sink to perform grooming with supervision. ?  ? ?Extremity/Trunk Assessment Upper Extremity Assessment ?Upper  Extremity Assessment: Overall WFL for tasks assessed ?  ?  ?  ?  ?  ? ?Vision Patient Visual Report: No change from baseline ?  ?  ?Perception   ?  ?Praxis   ?  ? ?Cognition Arousal/Alertness: Awake/alert ?Behavior During Therapy: [facility] for tasks assessed/performed ?Overall Cognitive Status: Within Functional Limits for tasks assessed ?  ?  ?  ?  ?  ?  ?  ?  ?  ?  ?  ?  ?  ?  ?  ?  ?General Comments: alert and pleasant. Has memory deficits ?  ?  ?   ?Exercises   ? ?  ?Shoulder Instructions   ? ? ?  ?General Comments    ? ? ?Pertinent Vitals/ Pain       Pain Assessment ?Pain Assessment: No/denies pain ? ?Home Living   ?  ?  ?  ?  ?  ?  ?  ?  ?  ?  ?  ?  ?  ?  ?  ?  ?  ?  ? ?  ?Prior Functioning/Environment    ?  ?  ?  ?   ? ?Frequency ? Min 2X/week  ? ? ? ? ?  ?Progress Toward Goals ? ?OT Goals(current goals can now be found in the care plan section) ? Progress towards OT goals: Progressing toward goals ? ?Acute Rehab OT Goals ?Patient Stated Goal: feel stronger ?OT Goal Formulation: With patient ?Time For Goal Achievement: 07/18/21 ?Potential to Achieve Goals: Good  ?Plan Discharge plan remains appropriate   ? ?Co-evaluation ? ? ?   ?  ?  ?  ?  ? ?  ?AM-PAC OT "6 Clicks" Daily Activity     ?Outcome Measure ? ? Help from another person eating meals?: A Little ?Help from another person taking care of personal grooming?: A Little ?Help from another person toileting, which includes using toliet, bedpan, or urinal?: A Little ?Help from another person bathing (including washing, rinsing, drying)?: A Little ?Help from another person to put on and taking off regular upper body clothing?: A Little ?Help from another person to put on and taking off regular lower body clothing?: A Little ?6 Click Score: 18 ? ?  ?End of Session Equipment Utilized During Treatment: Rolling walker (2 wheels) ? ?OT Visit Diagnosis: Pain;Muscle weakness (generalized) (M62.81) ?Pain - Right/Left: Left ?Pain - part of body: Knee ?  ?Activity Tolerance  Patient tolerated treatment well ?  ?Patient Left in chair;with call bell/phone within reach;with chair alarm set;with nursing/sitter in room ?  ?Nurse Communication Mobility status ?  ? ?   ? ?Time: 0900-0919 ?OT Time Calculation (min): 19 min ? ?Charges: OT General Charges ?$OT Visit: 1 Visit ?OT Treatments ?$Self Care/Home Management : 8-22 mins ? ?Andre Casey, Andre Casey ?Acute Care Rehab Services  ?Office [phone] ?Pager: [phone]  ? ?Andre Casey ?07/10/2021, 9:34 AM ?

## 2021-07-12 LAB — CULTURE, BLOOD (ROUTINE X 2)
Culture: NO GROWTH
Culture: NO GROWTH
Special Requests: ADEQUATE

## 2021-07-30 ENCOUNTER — Encounter (HOSPITAL_COMMUNITY): Payer: Self-pay | Admitting: Internal Medicine

## 2021-07-30 ENCOUNTER — Emergency Department (HOSPITAL_COMMUNITY): Payer: Medicare HMO

## 2021-07-30 ENCOUNTER — Inpatient Hospital Stay (HOSPITAL_COMMUNITY)
Admission: EM | Admit: 2021-07-30 | Discharge: 2021-08-08 | DRG: 871 | Disposition: A | Discharge status: Skilled Nursing Facility | Payer: Medicare HMO | Attending: Internal Medicine | Admitting: Internal Medicine

## 2021-07-30 DIAGNOSIS — N320 Bladder-neck obstruction: Secondary | ICD-10-CM | POA: Diagnosis present

## 2021-07-30 DIAGNOSIS — E43 Unspecified severe protein-calorie malnutrition: Secondary | ICD-10-CM | POA: Diagnosis present

## 2021-07-30 DIAGNOSIS — R6520 Severe sepsis without septic shock: Secondary | ICD-10-CM

## 2021-07-30 DIAGNOSIS — N2889 Other specified disorders of kidney and ureter: Secondary | ICD-10-CM | POA: Diagnosis present

## 2021-07-30 DIAGNOSIS — D75839 Thrombocytosis, unspecified: Secondary | ICD-10-CM | POA: Diagnosis present

## 2021-07-30 DIAGNOSIS — I2510 Atherosclerotic heart disease of native coronary artery without angina pectoris: Secondary | ICD-10-CM | POA: Diagnosis present

## 2021-07-30 DIAGNOSIS — D638 Anemia in other chronic diseases classified elsewhere: Secondary | ICD-10-CM | POA: Diagnosis present

## 2021-07-30 DIAGNOSIS — Z87891 Personal history of nicotine dependence: Secondary | ICD-10-CM

## 2021-07-30 DIAGNOSIS — R4189 Other symptoms and signs involving cognitive functions and awareness: Secondary | ICD-10-CM | POA: Diagnosis present

## 2021-07-30 DIAGNOSIS — N390 Urinary tract infection, site not specified: Secondary | ICD-10-CM

## 2021-07-30 DIAGNOSIS — E8809 Other disorders of plasma-protein metabolism, not elsewhere classified: Secondary | ICD-10-CM | POA: Diagnosis present

## 2021-07-30 DIAGNOSIS — A4902 Methicillin resistant Staphylococcus aureus infection, unspecified site: Secondary | ICD-10-CM | POA: Diagnosis not present

## 2021-07-30 DIAGNOSIS — Z85828 Personal history of other malignant neoplasm of skin: Secondary | ICD-10-CM

## 2021-07-30 DIAGNOSIS — Z923 Personal history of irradiation: Secondary | ICD-10-CM

## 2021-07-30 DIAGNOSIS — Z8546 Personal history of malignant neoplasm of prostate: Secondary | ICD-10-CM

## 2021-07-30 DIAGNOSIS — F0390 Unspecified dementia without behavioral disturbance: Secondary | ICD-10-CM | POA: Diagnosis present

## 2021-07-30 DIAGNOSIS — R509 Fever, unspecified: Secondary | ICD-10-CM

## 2021-07-30 DIAGNOSIS — E872 Acidosis, unspecified: Secondary | ICD-10-CM | POA: Diagnosis present

## 2021-07-30 DIAGNOSIS — B9562 Methicillin resistant Staphylococcus aureus infection as the cause of diseases classified elsewhere: Secondary | ICD-10-CM | POA: Diagnosis present

## 2021-07-30 DIAGNOSIS — M1990 Unspecified osteoarthritis, unspecified site: Secondary | ICD-10-CM | POA: Diagnosis present

## 2021-07-30 DIAGNOSIS — Z1612 Extended spectrum beta lactamase (ESBL) resistance: Secondary | ICD-10-CM | POA: Diagnosis present

## 2021-07-30 DIAGNOSIS — K219 Gastro-esophageal reflux disease without esophagitis: Secondary | ICD-10-CM | POA: Diagnosis present

## 2021-07-30 DIAGNOSIS — B961 Klebsiella pneumoniae [K. pneumoniae] as the cause of diseases classified elsewhere: Secondary | ICD-10-CM | POA: Diagnosis present

## 2021-07-30 DIAGNOSIS — Z602 Problems related to living alone: Secondary | ICD-10-CM | POA: Diagnosis present

## 2021-07-30 DIAGNOSIS — H9193 Unspecified hearing loss, bilateral: Secondary | ICD-10-CM | POA: Diagnosis present

## 2021-07-30 DIAGNOSIS — Z66 Do not resuscitate: Secondary | ICD-10-CM | POA: Diagnosis present

## 2021-07-30 DIAGNOSIS — Z79899 Other long term (current) drug therapy: Secondary | ICD-10-CM

## 2021-07-30 DIAGNOSIS — I10 Essential (primary) hypertension: Secondary | ICD-10-CM

## 2021-07-30 DIAGNOSIS — E7800 Pure hypercholesterolemia, unspecified: Secondary | ICD-10-CM | POA: Diagnosis present

## 2021-07-30 DIAGNOSIS — I252 Old myocardial infarction: Secondary | ICD-10-CM | POA: Diagnosis not present

## 2021-07-30 DIAGNOSIS — Z9079 Acquired absence of other genital organ(s): Secondary | ICD-10-CM

## 2021-07-30 DIAGNOSIS — Z681 Body mass index (BMI) 19 or less, adult: Secondary | ICD-10-CM

## 2021-07-30 DIAGNOSIS — Z8249 Family history of ischemic heart disease and other diseases of the circulatory system: Secondary | ICD-10-CM

## 2021-07-30 DIAGNOSIS — Z515 Encounter for palliative care: Secondary | ICD-10-CM | POA: Diagnosis not present

## 2021-07-30 DIAGNOSIS — Z955 Presence of coronary angioplasty implant and graft: Secondary | ICD-10-CM

## 2021-07-30 DIAGNOSIS — C61 Malignant neoplasm of prostate: Secondary | ICD-10-CM | POA: Diagnosis not present

## 2021-07-30 DIAGNOSIS — A419 Sepsis, unspecified organism: Secondary | ICD-10-CM | POA: Diagnosis present

## 2021-07-30 DIAGNOSIS — Z7982 Long term (current) use of aspirin: Secondary | ICD-10-CM

## 2021-07-30 DIAGNOSIS — Z96611 Presence of right artificial shoulder joint: Secondary | ICD-10-CM | POA: Diagnosis present

## 2021-07-30 DIAGNOSIS — Z79891 Long term (current) use of opiate analgesic: Secondary | ICD-10-CM

## 2021-07-30 DIAGNOSIS — N179 Acute kidney failure, unspecified: Secondary | ICD-10-CM | POA: Diagnosis present

## 2021-07-30 DIAGNOSIS — G4733 Obstructive sleep apnea (adult) (pediatric): Secondary | ICD-10-CM | POA: Diagnosis present

## 2021-07-30 DIAGNOSIS — E876 Hypokalemia: Secondary | ICD-10-CM | POA: Diagnosis present

## 2021-07-30 DIAGNOSIS — N393 Stress incontinence (female) (male): Secondary | ICD-10-CM | POA: Diagnosis present

## 2021-07-30 DIAGNOSIS — Z888 Allergy status to other drugs, medicaments and biological substances status: Secondary | ICD-10-CM

## 2021-07-30 DIAGNOSIS — N3000 Acute cystitis without hematuria: Principal | ICD-10-CM

## 2021-07-30 DIAGNOSIS — Z7189 Other specified counseling: Secondary | ICD-10-CM | POA: Diagnosis not present

## 2021-07-30 LAB — URINALYSIS, ROUTINE W REFLEX MICROSCOPIC
Bilirubin Urine: NEGATIVE
Glucose, UA: NEGATIVE mg/dL
Hgb urine dipstick: NEGATIVE
Ketones, ur: 5 mg/dL — AB
Nitrite: NEGATIVE
Protein, ur: 100 mg/dL — AB
Specific Gravity, Urine: 1.02 (ref 1.005–1.030)
WBC, UA: >50 WBC/hpf — ABNORMAL HIGH (ref 0–5)
pH: 5 (ref 5.0–8.0)

## 2021-07-30 LAB — CBC WITH DIFFERENTIAL/PLATELET
Abs Immature Granulocytes: 0.07 10*3/uL (ref 0.00–0.07)
Basophils Absolute: 0 10*3/uL (ref 0.0–0.1)
Basophils Relative: 0 %
Eosinophils Absolute: 0 10*3/uL (ref 0.0–0.5)
Eosinophils Relative: 0 %
HCT: 37.9 % — ABNORMAL LOW (ref 39.0–52.0)
Hemoglobin: 11.7 g/dL — ABNORMAL LOW (ref 13.0–17.0)
Immature Granulocytes: 1 %
Lymphocytes Relative: 15 %
Lymphs Abs: 2 10*3/uL (ref 0.7–4.0)
MCH: 26.7 pg (ref 26.0–34.0)
MCHC: 30.9 g/dL (ref 30.0–36.0)
MCV: 86.3 fL (ref 80.0–100.0)
Monocytes Absolute: 1.9 10*3/uL — ABNORMAL HIGH (ref 0.1–1.0)
Monocytes Relative: 15 %
Neutro Abs: 9.1 10*3/uL — ABNORMAL HIGH (ref 1.7–7.7)
Neutrophils Relative %: 69 %
Platelets: 386 10*3/uL (ref 150–400)
RBC: 4.39 MIL/uL (ref 4.22–5.81)
RDW: 14.7 % (ref 11.5–15.5)
WBC: 13.1 10*3/uL — ABNORMAL HIGH (ref 4.0–10.5)
nRBC: 0 % (ref 0.0–0.2)

## 2021-07-30 LAB — COMPREHENSIVE METABOLIC PANEL
ALT: 9 U/L (ref 0–44)
AST: 20 U/L (ref 15–41)
Albumin: 3.4 g/dL — ABNORMAL LOW (ref 3.5–5.0)
Alkaline Phosphatase: 67 U/L (ref 38–126)
Anion gap: 13 (ref 5–15)
BUN: 24 mg/dL — ABNORMAL HIGH (ref 8–23)
CO2: 22 mmol/L (ref 22–32)
Calcium: 9.1 mg/dL (ref 8.9–10.3)
Chloride: 101 mmol/L (ref 98–111)
Creatinine, Ser: 1.36 mg/dL — ABNORMAL HIGH (ref 0.61–1.24)
GFR, Estimated: 50 mL/min — ABNORMAL LOW (ref >60)
Glucose, Bld: 167 mg/dL — ABNORMAL HIGH (ref 70–99)
Potassium: 3.2 mmol/L — ABNORMAL LOW (ref 3.5–5.1)
Sodium: 136 mmol/L (ref 135–145)
Total Bilirubin: 1 mg/dL (ref 0.3–1.2)
Total Protein: 7.5 g/dL (ref 6.5–8.1)

## 2021-07-30 LAB — SODIUM, URINE, RANDOM: Sodium, Ur: 23 mmol/L

## 2021-07-30 LAB — CREATININE, URINE, RANDOM: Creatinine, Urine: 253.66 mg/dL

## 2021-07-30 LAB — LACTIC ACID, PLASMA
Lactic Acid, Venous: 5.1 mmol/L (ref 0.5–1.9)
Lactic Acid, Venous: 2.5 mmol/L (ref 0.5–1.9)

## 2021-07-30 LAB — PROTIME-INR
INR: 1.2 (ref 0.8–1.2)
Prothrombin Time: 15.2 seconds (ref 11.4–15.2)

## 2021-07-30 LAB — APTT: aPTT: 35 seconds (ref 24–36)

## 2021-07-30 MED ORDER — LACTATED RINGERS IV SOLN: INTRAVENOUS | Status: DC

## 2021-07-30 MED ORDER — LACTATED RINGERS IV BOLUS (SEPSIS)
500.0000 mL | Freq: Once | INTRAVENOUS | Status: AC
  Administered 2021-07-30: 500 mL via INTRAVENOUS

## 2021-07-30 MED ORDER — TRAMADOL HCL 50 MG PO TABS
50.0000 mg | ORAL_TABLET | Freq: Two times a day (BID) | ORAL | Status: DC | PRN
  Administered 2021-07-30: 50 mg via ORAL
  Filled 2021-07-30: qty 1

## 2021-07-30 MED ORDER — POTASSIUM CHLORIDE CRYS ER 20 MEQ PO TBCR
40.0000 meq | EXTENDED_RELEASE_TABLET | Freq: Once | ORAL | Status: AC
  Administered 2021-07-30: 40 meq via ORAL
  Filled 2021-07-30: qty 2

## 2021-07-30 MED ORDER — LACTATED RINGERS IV BOLUS (SEPSIS)
250.0000 mL | Freq: Once | INTRAVENOUS | Status: AC
  Administered 2021-07-30: 250 mL via INTRAVENOUS

## 2021-07-30 MED ORDER — SODIUM CHLORIDE 0.9 % IV SOLN
2.0000 g | INTRAVENOUS | Status: DC
  Administered 2021-07-30 – 2021-07-31 (×2): 2 g via INTRAVENOUS
  Filled 2021-07-30 (×2): qty 20

## 2021-07-30 MED ORDER — ASPIRIN 81 MG PO TBEC
81.0000 mg | DELAYED_RELEASE_TABLET | Freq: Every day | ORAL | Status: DC
  Administered 2021-07-30 – 2021-08-07 (×9): 81 mg via ORAL
  Filled 2021-07-30 (×9): qty 1

## 2021-07-30 MED ORDER — ACETAMINOPHEN 325 MG PO TABS
650.0000 mg | ORAL_TABLET | Freq: Four times a day (QID) | ORAL | Status: DC | PRN
Start: 2021-07-30 — End: 2021-07-30
  Administered 2021-07-30: 650 mg via ORAL
  Filled 2021-07-30: qty 2

## 2021-07-30 MED ORDER — LACTATED RINGERS IV BOLUS (SEPSIS)
1000.0000 mL | Freq: Once | INTRAVENOUS | Status: AC
  Administered 2021-07-30: 1000 mL via INTRAVENOUS

## 2021-07-30 MED ORDER — ACETAMINOPHEN 650 MG RE SUPP: 650.0000 mg | Freq: Four times a day (QID) | RECTAL | Status: DC | PRN

## 2021-07-30 MED ORDER — DICLOFENAC SODIUM 1 % EX GEL
2.0000 g | Freq: Two times a day (BID) | CUTANEOUS | Status: DC | PRN
  Administered 2021-08-07: 2 g via TOPICAL
  Filled 2021-07-30: qty 100

## 2021-07-30 MED ORDER — MEMANTINE HCL 10 MG PO TABS
10.0000 mg | ORAL_TABLET | Freq: Two times a day (BID) | ORAL | Status: DC
  Administered 2021-07-30 – 2021-08-08 (×18): 10 mg via ORAL
  Filled 2021-07-30 (×3): qty 1
  Filled 2021-07-30: qty 2
  Filled 2021-07-30 (×4): qty 1
  Filled 2021-07-30: qty 2
  Filled 2021-07-30 (×4): qty 1
  Filled 2021-07-30: qty 2
  Filled 2021-07-30 (×4): qty 1

## 2021-07-30 MED ORDER — ACETAMINOPHEN 325 MG PO TABS: 650.0000 mg | ORAL_TABLET | Freq: Four times a day (QID) | ORAL | Status: DC | PRN

## 2021-07-30 NOTE — Assessment & Plan Note (Signed)
(  please see severe sepsis) 

## 2021-07-30 NOTE — Assessment & Plan Note (Signed)
 #)   Hypokalemia: Presenting serum potassium level noted to be 3.2.  In the context of concomitant acute kidney injury, will be slightly less aggressive in associated repletion of the patient's serum potassium level, will closely monitoring in setting renal function.  Plan: Potassium chloride 40 mEq p.o. x1 dose now.  Add on serum magnesium level.  Repeat CMP and serum magnesium level in the morning.  Monitor on telemetry.

## 2021-07-30 NOTE — Assessment & Plan Note (Signed)
 #)   Anemia of chronic disease: Documented history of such, associated baseline hemoglobin range of 9-12, presenting alone consistent with this baseline range, in the absence of any overt evidence of active bleed.  Associated presenting normocytic/normochromic findings as well as an elevated RDW also appear consistent with underlying diagnosis of anemia of chronic disease.  Plan: Repeat CBC in the morning.

## 2021-07-30 NOTE — ED Provider Notes (Addendum)
[facility] DEPT Provider Note   CSN: [id number] Arrival date & time: 07/30/21  1620     History  No chief complaint on file.   Andre Casey. is a 88 y.o. male.  Patient complains of a fever.   Patient reports he does not have a very good memory but thinks he may have a urinary tract infection.  Patient reports he lives at home alone.  Patient states he has a granddaughter that checks on him regularly patient denies any cough or congestion.  Patient states he does not feel well today.   The history is provided by the patient. No language interpreter was used.  Dysuria Relieved by:  Nothing Worsened by:  Nothing Ineffective treatments:  None tried Associated symptoms: fever   Associated symptoms: no abdominal pain   Risk factors: no kidney stones       Home Medications Prior to Admission medications   Medication Sig Start Date End Date Taking? Authorizing Provider  acetaminophen (TYLENOL) 500 MG tablet Take 1,000 mg by mouth daily with lunch.    [provider]  amLODipine (NORVASC) 2.5 MG tablet Take 2.5 mg by mouth at bedtime.    [provider]  Ascorbic Acid (VITAMIN C PO) Take 1 tablet by mouth every morning.    [provider]  aspirin EC 81 MG tablet Take 1 tablet (81 mg total) by mouth daily. Swallow whole. Patient taking differently: Take 81 mg by mouth at bedtime. 03/17/21   Antonieta Pert, MD  MAGNESIUM PO Take 1 tablet by mouth every Monday, Wednesday, and Friday. Take one tablet by mouth every Monday, Wednesday, Friday night    [provider]  Melatonin 3 MG CAPS Take 1 capsule by mouth at bedtime.    [provider]  memantine (NAMENDA) 10 MG tablet Take 1 tablet (10 mg total) by mouth 2 (two) times daily. 04/08/21   Marcial Pacas, MD  mirtazapine (REMERON) 7.5 MG tablet Take 7.5 mg by mouth at bedtime.    [provider]  Multiple Vitamins-Minerals (PRESERVISION AREDS 2) CAPS  Take 1 capsule by mouth 2 (two) times daily.    [provider]  polyethylene glycol (MIRALAX / GLYCOLAX) 17 g packet Take 17 g by mouth daily. 07/11/21   Elodia Florence., MD  Probiotic Product (PROBIOTIC PO) Take 1 tablet by mouth daily with lunch.    [provider]  senna-docusate (SENOKOT-S) 8.6-50 MG tablet Take 1 tablet by mouth at bedtime as needed for mild constipation. 04/28/21   Mercy Riding, MD  traMADol (ULTRAM) 50 MG tablet Take 1 tablet (50 mg total) by mouth every 6 (six) hours as needed. Patient taking differently: Take 50 mg by mouth 2 (two) times daily as needed for moderate pain. 04/29/20   Veryl Speak, MD      Allergies    Aricept [donepezil] and Namzaric [memantine hcl-donepezil hcl]    Review of Systems   Review of Systems  Constitutional:  Positive for fatigue and fever. Negative for diaphoresis.  HENT:  Negative for sore throat.   Eyes:  Negative for visual disturbance.  Respiratory:  Negative for cough.   Cardiovascular:  Negative for chest pain.  Gastrointestinal:  Negative for abdominal pain.  Psychiatric/Behavioral:  Positive for confusion.   All other systems reviewed and are negative.  Physical Exam Updated Vital Signs BP 132/69   Pulse 99   Temp (!) 102.7 F (39.3 C) (Rectal)   Resp (!) 23  SpO2 95%  Physical Exam Vitals and nursing note reviewed.  Constitutional:      Appearance: He is well-developed.  HENT:     Head: Normocephalic.     Right Ear: Tympanic membrane normal.     Left Ear: Tympanic membrane normal.     Mouth/Throat:     Mouth: Mucous membranes are moist.  Eyes:     Pupils: Pupils are equal, round, and reactive to light.  Cardiovascular:     Rate and Rhythm: Normal rate.  Pulmonary:     Effort: Pulmonary effort is normal.  Abdominal:     General: Abdomen is flat. There is no distension.     Comments: Purulent drainage around suprapubic site.   Musculoskeletal:        General: Normal range of motion.      Cervical back: Normal range of motion.  Skin:    General: Skin is warm.  Neurological:     General: No focal deficit present.     Mental Status: He is alert and oriented to person, place, and time.  Psychiatric:        Mood and Affect: Mood normal.    ED Results / Procedures / Treatments   Labs (all labs ordered are listed, but only abnormal results are displayed) Labs Reviewed  CBC WITH DIFFERENTIAL/PLATELET - Abnormal; Notable for the following components:      Result Value   WBC 13.1 (*)    Hemoglobin 11.7 (*)    HCT 37.9 (*)    Neutro Abs 9.1 (*)    Monocytes Absolute 1.9 (*)    All other components within normal limits  COMPREHENSIVE METABOLIC PANEL - Abnormal; Notable for the following components:   Potassium 3.2 (*)    Glucose, Bld 167 (*)    BUN 24 (*)    Creatinine, Ser 1.36 (*)    Albumin 3.4 (*)    GFR, Estimated 50 (*)    All other components within normal limits  LACTIC ACID, PLASMA - Abnormal; Notable for the following components:   Lactic Acid, Venous 5.1 (*)    All other components within normal limits  URINALYSIS, ROUTINE W REFLEX MICROSCOPIC - Abnormal; Notable for the following components:   APPearance CLOUDY (*)    Ketones, ur 5 (*)    Protein, ur 100 (*)    Leukocytes,Ua MODERATE (*)    WBC, UA >50 (*)    Bacteria, UA MANY (*)    All other components within normal limits  CULTURE, BLOOD (ROUTINE X 2)  CULTURE, BLOOD (ROUTINE X 2)  URINE CULTURE  PROTIME-INR  APTT  LACTIC ACID, PLASMA    EKG None  Radiology DG Chest Port 1 View  Result Date: 07/30/2021 CLINICAL DATA:  UTI no EXAM: PORTABLE CHEST 1 VIEW COMPARISON:  None Available. FINDINGS: The heart size and mediastinal contours are within normal limits. Both lungs are clear. No pleural effusion or pneumothorax. Partially imaged right shoulder reverse arthroplasty. IMPRESSION: No acute process in the chest. Electronically Signed   By: Macy Mis M.D.   On: 07/30/2021 17:36     Procedures .Critical Care Performed by: Fransico Meadow, PA-C Authorized by: Fransico Meadow, PA-C   Critical care provider statement:    Critical care time (minutes):  45   Critical care start time:  07/30/2021 5:45 PM   Critical care end time:  07/30/2021 8:00 PM   Critical care was time spent personally by me on the following activities:  Development of treatment plan  with patient or surrogate, discussions with consultants, evaluation of patient's response to treatment, examination of patient, ordering and review of laboratory studies, ordering and review of radiographic studies, ordering and performing treatments and interventions, pulse oximetry, re-evaluation of patient's condition and review of old charts   Care discussed with: admitting provider      Medications Ordered in ED Medications  cefTRIAXone (ROCEPHIN) 2 g in sodium chloride 0.9 % 100 mL IVPB (0 g Intravenous Stopped 07/30/21 1801)  acetaminophen (TYLENOL) tablet 650 mg (650 mg Oral Given 07/30/21 1841)  lactated ringers bolus 1,000 mL (0 mLs Intravenous Stopped 07/30/21 1937)    And  lactated ringers bolus 500 mL (0 mLs Intravenous Stopped 07/30/21 1937)    And  lactated ringers bolus 250 mL (0 mLs Intravenous Stopped 07/30/21 1937)    ED Course/ Medical Decision Making/ A&P                           Medical Decision Making Patient reports he has had a fever today he has had some discomfort with urination.  Problems Addressed: Fever, unspecified fever cause: acute illness or injury    Details: Pt has a temperature of 103.  Amount and/or Complexity of Data Reviewed Independent Historian: EMS    Details: Patient brought in by EMS External Data Reviewed: notes.    Details: Hospitalist notes from previous admission reviewed.  Urology notes from Dr. Amalia Hailey at [facility] reviewed.  Patient has a history of infection around suprapubic surgical site. Pt has a history of prostate infection. Labs: ordered. Decision-making details  documented in ED Course.    Details: Labs ordered. reviewedReviewed and interpreted, Reviewed and interpreted, UA shows white blood cell count of 50 bacteria many patient has an elevated lactic acid of 1.5 Radiology: ordered and independent interpretation performed. Decision-making details documented in ED Course.    Details: Chest x-ray obtained reviewed and interpreted chest x-ray shows no acute process Discussion of management or test interpretation with external provider(s): [facility] consulted for admission.  Dr, Eugenia Pancoast will see and admit  Risk OTC drugs. Decision regarding hospitalization. Risk Details: Differential diagnosis patient presents with symptoms of sepsis he has an elevated temperature, he does not have any cough, symptoms most likely secondary to urinary tract infection.  Patient is given IV fluid bolus x30 milligrams per kilogram.  Patient is given Rocephin 2 g IV.  I will obtain a second lactic acid.  Patient has had a decreased temperature to 102.7.  Patient reevaluated and is improving. Sepsis - Repeat Assessment  Performed at:      Vitals     Blood pressure (!) 106/58, pulse 82, temperature (!) 102.7 F (39.3 C), temperature source Rectal, resp. rate 19, SpO2 95 %.  Heart:     Regular rate and rhythm  Lungs:    CTA  Capillary Refill:   <2 sec  Peripheral Pulse:   Radial pulse palpable  Skin:     Normal Color      Critical Care Total time providing critical care: 45 minutes          Final Clinical Impression(s) / ED Diagnoses Final diagnoses:  Sepsis, due to unspecified organism, unspecified whether acute organ dysfunction present [facility])  Urinary tract infection without hematuria, site unspecified  Fever, unspecified fever cause    Rx / DC Orders ED Discharge Orders     None         Fransico Meadow, [place] 07/30/21 2054  Sidney Ace 07/30/21 2117    Varney Biles, MD 07/30/21 2322

## 2021-07-30 NOTE — Assessment & Plan Note (Signed)
 #)   Severe sepsis due to urinary tract infection: In the context of presenting 1 day of objective fever associated with chills, patient's urine, within the context of a suprapubic catheter in the setting of a history of prostate cancer, appears to be the source, with associated new development of cloudy appearing specimen notable for greater than 50 white blood cells many bacteria and no squamous epithelial cells.   SIRS criteria met via presenting objective fever, with temperature max of 103.2 in the ED, tachycardia, tachypnea, leukocytosis. Lactic acid level: Initially 5.1, which subsequently decreased to 2.5 following, administration of lactated Ringer's x1750 cc bolus (consistent with 30 mL/kg IVF bolus in the context of his most recently documented weight of 58 kg).   Of note, given the associated presence of suspected end organ damage in the form of concominant presenting lactic acidosis as well as acute kidney injury, criteria are met for pt's sepsis to be considered severe in nature.  While the patient has.  Hemodynamically stable throughout his ED course, including no objective hypotension, criteria are technically met for septic shock on the basis of lactate greater than 4.0.   Additional ED work-up/management notable for: Blood cultures x2 as well as urine culture collected prior to initiation of Rocephin, which will be continued.  No e/o additional infectious process at this time, including chest x-ray which showed no evidence of acute cardiopulmonary process, CT abdomen/pelvis showed no overt evidence of additional underlying infectious process, including no evidence of abscess around his suprapubic catheter, as above.   Plan: CBC w/ diff and CMP in AM.  Follow for results of blood cx's x 2.  Follow-up result urine culture.  Abx: Continue Rocephin.  Continuous LR at 100 cc/h.  Repeat lactate in the morning.

## 2021-07-30 NOTE — Progress Notes (Signed)
[facility] is following code sepsis 

## 2021-07-30 NOTE — Assessment & Plan Note (Signed)
  #)   Essential Hypertension: documented h/o such, with outpatient antihypertensive regimen including amlodipine.  SBP's in the ED today: Normotensive to mildly hypertensive, with systolic blood pressures at times into the 160s.  Most recent systolic blood pressures noted to be in the 120s mmHg. in the context of presenting severe sepsis, will hold home antihypertensive medications for now.  Plan: Close monitoring of subsequent BP via routine VS. hold home antihypertensive medications for now.

## 2021-07-30 NOTE — ED Triage Notes (Signed)
Ems bring pt in from home for suspected UTI. Family is concerned about pt's dark urine.
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
## 2021-07-30 NOTE — ED Notes (Signed)
Leg bag drained and replaced with standard drainage bag.

## 2021-07-30 NOTE — Assessment & Plan Note (Signed)
 #)   Acute Kidney Injury: In context of the patient having a suprapubic Foley catheter catheter, without evidence of associated obstruction, presenting serum creatinine noted to be 1.36 relative to most recent prior value of 0.97 on 07/10/2021.  Suspect.  All contribution stemming from intravascular ablation as a consequence of presenting severe sepsis, as above.  Presenting urinalysis with microscopy was notable for significant pyuria, 100 protein and 11-20 red blood cells.  Known right renal mass noted on CT abdomen/pelvis, as above.   Plan: monitor strict I's & O's and daily weights. Attempt to avoid nephrotoxic agents. Refrain from NSAIDs. Repeat CMP in the morning. Check serum magnesium level. Add-on random urine sodium and random urine creatinine.  Continuous LR, as above, in addition to further evaluation management of presenting severe sepsis, as above.

## 2021-07-30 NOTE — ED Notes (Signed)
Pt was leaking urine from penis, so bed linens became wet. Linens changed, peri care provided. Pt placed on male pure wick catheter to keep dry. Pt repositioned for comfort. Pt requested voltaren gel for knee pain - provider notified. Call light in reach. Pt states no further needs.

## 2021-07-31 ENCOUNTER — Other Ambulatory Visit: Payer: Self-pay

## 2021-07-31 DIAGNOSIS — C61 Malignant neoplasm of prostate: Secondary | ICD-10-CM

## 2021-07-31 DIAGNOSIS — D638 Anemia in other chronic diseases classified elsewhere: Secondary | ICD-10-CM | POA: Diagnosis not present

## 2021-07-31 DIAGNOSIS — I10 Essential (primary) hypertension: Secondary | ICD-10-CM | POA: Diagnosis not present

## 2021-07-31 DIAGNOSIS — Z515 Encounter for palliative care: Secondary | ICD-10-CM | POA: Diagnosis not present

## 2021-07-31 DIAGNOSIS — A419 Sepsis, unspecified organism: Secondary | ICD-10-CM | POA: Diagnosis not present

## 2021-07-31 DIAGNOSIS — Z7189 Other specified counseling: Secondary | ICD-10-CM | POA: Diagnosis not present

## 2021-07-31 DIAGNOSIS — Z66 Do not resuscitate: Secondary | ICD-10-CM

## 2021-07-31 DIAGNOSIS — N179 Acute kidney failure, unspecified: Secondary | ICD-10-CM | POA: Diagnosis not present

## 2021-07-31 LAB — CBC WITH DIFFERENTIAL/PLATELET
Abs Immature Granulocytes: 0.05 10*3/uL (ref 0.00–0.07)
Basophils Absolute: 0 10*3/uL (ref 0.0–0.1)
Basophils Relative: 0 %
Eosinophils Absolute: 0.2 10*3/uL (ref 0.0–0.5)
Eosinophils Relative: 2 %
HCT: 32.2 % — ABNORMAL LOW (ref 39.0–52.0)
Hemoglobin: 10.3 g/dL — ABNORMAL LOW (ref 13.0–17.0)
Immature Granulocytes: 1 %
Lymphocytes Relative: 25 %
Lymphs Abs: 2.3 10*3/uL (ref 0.7–4.0)
MCH: 27.6 pg (ref 26.0–34.0)
MCHC: 32 g/dL (ref 30.0–36.0)
MCV: 86.3 fL (ref 80.0–100.0)
Monocytes Absolute: 1.5 10*3/uL — ABNORMAL HIGH (ref 0.1–1.0)
Monocytes Relative: 16 %
Neutro Abs: 5.2 10*3/uL (ref 1.7–7.7)
Neutrophils Relative %: 56 %
Platelets: 256 10*3/uL (ref 150–400)
RBC: 3.73 MIL/uL — ABNORMAL LOW (ref 4.22–5.81)
RDW: 14.6 % (ref 11.5–15.5)
WBC: 9.3 10*3/uL (ref 4.0–10.5)
nRBC: 0 % (ref 0.0–0.2)

## 2021-07-31 LAB — COMPREHENSIVE METABOLIC PANEL
ALT: 8 U/L (ref 0–44)
AST: 13 U/L — ABNORMAL LOW (ref 15–41)
Albumin: 2.6 g/dL — ABNORMAL LOW (ref 3.5–5.0)
Alkaline Phosphatase: 57 U/L (ref 38–126)
Anion gap: 10 (ref 5–15)
BUN: 16 mg/dL (ref 8–23)
CO2: 26 mmol/L (ref 22–32)
Calcium: 8.8 mg/dL — ABNORMAL LOW (ref 8.9–10.3)
Chloride: 100 mmol/L (ref 98–111)
Creatinine, Ser: 0.79 mg/dL (ref 0.61–1.24)
GFR, Estimated: >60 mL/min (ref >60)
Glucose, Bld: 112 mg/dL — ABNORMAL HIGH (ref 70–99)
Potassium: 4.2 mmol/L (ref 3.5–5.1)
Sodium: 136 mmol/L (ref 135–145)
Total Bilirubin: 0.7 mg/dL (ref 0.3–1.2)
Total Protein: 6.2 g/dL — ABNORMAL LOW (ref 6.5–8.1)

## 2021-07-31 LAB — MAGNESIUM: Magnesium: 2 mg/dL (ref 1.7–2.4)

## 2021-07-31 LAB — LACTIC ACID, PLASMA: Lactic Acid, Venous: 1.4 mmol/L (ref 0.5–1.9)

## 2021-07-31 MED ORDER — ACETAMINOPHEN 500 MG PO TABS
1000.0000 mg | ORAL_TABLET | Freq: Three times a day (TID) | ORAL | Status: DC
  Administered 2021-07-31 – 2021-08-08 (×24): 1000 mg via ORAL
  Filled 2021-07-31 (×24): qty 2

## 2021-07-31 NOTE — ED Notes (Signed)
DNR purple sticker applied to pt's armband on the left wrist

## 2021-07-31 NOTE — ED Notes (Addendum)
No urine output noted from pt's suprapubic catheter since bag was changed around 2200, but about 450 in canister from male pure wick cath. Attempted to irrigate suprapubic catheter with NS and apply negative pressure, but each time saline was instilled into catheter, it leaked out of penis. Provider notified.

## 2021-07-31 NOTE — ED Notes (Signed)
Pt appears to be resting with TV on, reports no needs or concerns, even RR and unlabored, NAD noted, call bell in reach, side rails up x2 for safety, care on going, will continue to monitor.

## 2021-07-31 NOTE — Progress Notes (Signed)
PROGRESS NOTE    Andre Casey.  BSJ:[id number] DOB: 1929/09/17 DOA: 07/30/2021 PCP: [name], MD     Brief Narrative:   88 y.o. WM PMHx prostate cancer status post radiation and Lupron injections, essential HTN, CAD s/p PCI with stent placement x2 in November 2017, anemia of chronic disease associated baseline globin 9-12, Renal mass,   Admitted to [facility] on 07/30/2021 with severe sepsis due to urinary tract infection after presenting from home to [facility] ED complaining of fever.    The patient, lives at home, reports 1 day of objective fever, noting temperature max of 103 at home over that timeframe.  He notes associated chills in the absence of full body rigors or generalized myalgias.   Denies any recent headache, neck stiffness, rhinitis, rhinorrhea, sore throat, sob, wheezing, cough, nausea, vomiting, abdominal pain, diarrhea, or rash. No recent traveling or known COVID-19 exposures.  In the context of a history of prostate cancer he underwent suprapubic catheter placement on 05/16/2021.  Patient remains worsening cloudy appearance of associated urine over the last few days.   Denies any recent chest pain, diaphoresis, or palpitations.  No recent lower extremity erythema, calf tenderness, or worsening peripheral edema.   Of note, the patient reportedly recently had an superficial abscess surrounding his suprapubic catheter, for which she underwent IR aspiration on 07/05/2021.   Subjective: A/O x1 (does not know where, when, why) follows all commands   Assessment & Plan: Covid vaccination;   Principal Problem:   Severe sepsis ([place]) Active Problems:   Essential hypertension   Hypokalemia   Acute lower UTI   AKI (acute kidney injury) ([place])   Anemia of chronic disease  Severe sepsis/Complicated UTI -Continue current antibiotic x14 days - 6/7 patient unsure of last time suprapubic catheter changed.  Research EMR, appears quite old. -LR  150m/hr  Hypokalemia -  AKI (baseline Cr 0.97 on 5/17) -See sepsis  Lab Results  Component Value Date   CREATININE 0.79 07/31/2021   CREATININE 1.36 (H) 07/30/2021   CREATININE 0.97 07/10/2021   CREATININE 0.93 07/09/2021   CREATININE 1.07 07/08/2021  -6/7 resolved  Essential HTN   Anemia of chronic disease (baseline HgB 9-12)             Mobility Assessment (last 72 hours)     Mobility Assessment   No documentation.             Interdisciplinary Goals of Care Family Meeting   Date carried out: 07/31/2021  Location of the meeting:   Member's involved:   Durable Power of ATour manager     Discussion: We discussed goals of care for [employer] .    Code status:   Disposition:   Time spent for the meeting:     WAllie Bossier MD  07/31/2021, 4:05 PM         DVT prophylaxis: SCD Code Status: DNR Family Communication:  Status is: Inpatient    Dispo: The patient is from: Home              Anticipated d/c is to: Home              Anticipated d/c date is: 3 days              Patient currently is not medically stable to d/c.      Consultants:    Procedures/Significant Events:    I have personally reviewed and interpreted all  radiology studies and my findings are as above.  VENTILATOR SETTINGS:    Cultures   Antimicrobials: Anti-infectives (From admission, onward)    Start     Ordered Stop   07/30/21 1730  cefTRIAXone (ROCEPHIN) 2 g in sodium chloride 0.9 % 100 mL IVPB        07/30/21 1719 08/06/21 1729         Devices    LINES / TUBES:      Continuous Infusions:  cefTRIAXone (ROCEPHIN)  IV Stopped (07/30/21 1801)   lactated ringers Stopped (07/31/21 1336)     Objective: Vitals:   07/31/21 0714 07/31/21 0800 07/31/21 1100 07/31/21 1400  BP:  (!) 162/82 (!) 170/82 (!) 147/89  Pulse:  76 74 83  Resp:  18 (!) 22 15  Temp:      TempSrc:      SpO2:  98% 100%  100%  Weight: 51.3 kg     Height: '5\' 7"'$  (1.702 m)       Intake/Output Summary (Last 24 hours) at 07/31/2021 1605 Last data filed at 07/31/2021 0602 Gross per 24 hour  Intake 3232 ml  Output 600 ml  Net 2632 ml   Filed Weights   07/31/21 0714  Weight: 51.3 kg    Examination:  General: A/O x1 (does not know where, when, why) follows all commands, No acute respiratory distress Eyes: negative scleral hemorrhage, negative anisocoria, negative icterus ENT: Negative Runny nose, negative gingival bleeding, Neck:  Negative scars, masses, torticollis, lymphadenopathy, JVD Lungs: Clear to auscultation bilaterally without wheezes or crackles Cardiovascular: Regular rate and rhythm without murmur gallop or rub normal S1 and S2 Abdomen: negative abdominal pain, nondistended, positive soft, bowel sounds, no rebound, no ascites, no appreciable mass, suprapubic cath present appears old.  Negative frank pus around suprapubic cath. Extremities: No significant cyanosis, clubbing, or edema bilateral lower extremities Skin: Negative rashes, lesions, ulcers Psychiatric:  Negative depression, negative anxiety, negative fatigue, negative mania  Central nervous system:  Cranial nerves II through XII intact, tongue/uvula midline, all extremities muscle strength 5/5, sensation intact throughout, negative dysarthria, negative expressive aphasia, negative receptive aphasia.  .     Data Reviewed: Care during the described time interval was provided by me .  I have reviewed this patient's available data, including medical history, events of note, physical examination, and all test results as part of my evaluation.  CBC: Recent Labs  Lab 07/30/21 1718 07/31/21 0555  WBC 13.1* 9.3  NEUTROABS 9.1* 5.2  HGB 11.7* 10.3*  HCT 37.9* 32.2*  MCV 86.3 86.3  PLT 386 505   Basic Metabolic Panel: Recent Labs  Lab 07/30/21 1718 07/31/21 0555  NA 136 136  K 3.2* 4.2  CL 101 100  CO2 22 26  GLUCOSE 167* 112*   BUN 24* 16  CREATININE 1.36* 0.79  CALCIUM 9.1 8.8*  MG  --  2.0   GFR: Estimated Creatinine Clearance: 45.4 mL/min (by C-G formula based on SCr of 0.79 mg/dL). Liver Function Tests: Recent Labs  Lab 07/30/21 1718 07/31/21 0555  AST 20 13*  ALT 9 8  ALKPHOS 67 57  BILITOT 1.0 0.7  PROT 7.5 6.2*  ALBUMIN 3.4* 2.6*   No results for input(s): LIPASE, AMYLASE in the last 168 hours. No results for input(s): AMMONIA in the last 168 hours. Coagulation Profile: Recent Labs  Lab 07/30/21 1718  INR 1.2   Cardiac Enzymes: No results for input(s): CKTOTAL, CKMB, CKMBINDEX, TROPONINI in the last 168 hours. BNP (last 3  results) No results for input(s): PROBNP in the last 8760 hours. HbA1C: No results for input(s): HGBA1C in the last 72 hours. CBG: No results for input(s): GLUCAP in the last 168 hours. Lipid Profile: No results for input(s): CHOL, HDL, LDLCALC, TRIG, CHOLHDL, LDLDIRECT in the last 72 hours. Thyroid Function Tests: No results for input(s): TSH, T4TOTAL, FREET4, T3FREE, THYROIDAB in the last 72 hours. Anemia Panel: No results for input(s): VITAMINB12, FOLATE, FERRITIN, TIBC, IRON, RETICCTPCT in the last 72 hours. Sepsis Labs: Recent Labs  Lab 07/30/21 1718 07/30/21 1918 07/31/21 0555  LATICACIDVEN 5.1* 2.5* 1.4    Recent Results (from the past 240 hour(s))  Culture, blood (x 2)     Status: None (Preliminary result)   Collection Time: 07/30/21  4:45 PM   Specimen: BLOOD  Result Value Ref Range Status   Specimen Description   Final    BLOOD RIGHT ANTECUBITAL Performed at [facility], [place] [address] [place], [place] [address]    Special Requests   Final    BOTTLES DRAWN AEROBIC AND ANAEROBIC Blood Culture results may not be optimal due to an excessive volume of blood received in culture bottles Performed at [place] [address] [place], [place] [address]    Culture   Final    NO GROWTH < 12  HOURS Performed at [place] [address] [place], [place] [address]    Report Status PENDING  Incomplete  Culture, blood (x 2)     Status: None (Preliminary result)   Collection Time: 07/30/21  5:00 PM   Specimen: BLOOD RIGHT ARM  Result Value Ref Range Status   Specimen Description   Final    BLOOD RIGHT ARM Performed at [place] [address] [place], [place] [address]    Special Requests   Final    BOTTLES DRAWN AEROBIC AND ANAEROBIC Blood Culture results may not be optimal due to an excessive volume of blood received in culture bottles Performed at [place] [address], [place], [place] [address]    Culture   Final    NO GROWTH < 12 HOURS Performed at [place] [address], [place], [place] [address]    Report Status PENDING  Incomplete  Aerobic Culture w Gram Stain (superficial specimen)     Status: None (Preliminary result)   Collection Time: 07/30/21  7:56 PM   Specimen: Skin, Cyst/Tag/Debridement  Result Value Ref Range Status   Specimen Description   Final    SKIN ABDOMINAL WALL SUPRAPUBIC CATHETER INSERTION SITE Performed at [place] [address], [place], [place] [address]    Special Requests   Final    NONE Performed at [facility], [place] [address] [place], [place] [address]    Gram Stain   Final    NO SQUAMOUS EPITHELIAL CELLS SEEN FEW GRAM POSITIVE RODS FEW GRAM NEGATIVE RODS FEW GRAM POSITIVE COCCI NO WBC SEEN    Culture   Final    TOO YOUNG TO READ Performed at [facility], [place] [address], [place],  [address]    Report Status PENDING  Incomplete         Radiology Studies: CT ABDOMEN PELVIS WO CONTRAST  Result Date: 07/30/2021 CLINICAL DATA:  Abdominal pain, acute, nonlocalized pus drainage from supra pubic cath site EXAM: CT ABDOMEN AND PELVIS WITHOUT CONTRAST TECHNIQUE: Multidetector CT imaging of the abdomen  and pelvis  was performed following the standard protocol without IV contrast. RADIATION DOSE REDUCTION: This exam was performed according to the departmental dose-optimization program which includes automated exposure control, adjustment of the mA and/or kV according to patient size and/or use of iterative reconstruction technique. COMPARISON:  CT abdomen pelvis 07/03/2021 FINDINGS: Lower chest: No acute abnormality. Hepatobiliary: No focal liver abnormality. No gallstones, gallbladder wall thickening, or pericholecystic fluid. No biliary dilatation. Pancreas: Difficult to visualize possibly increased in size 1.6 cm (from 1.3 cm) pancreatic head lesion (2:24). Normal pancreatic contour. No surrounding inflammatory changes. No main pancreatic ductal dilatation. Spleen: Normal in size without focal abnormality. Adrenals/Urinary Tract: No adrenal nodule bilaterally. No nephrolithiasis and no hydronephrosis. Interval increase in size of a right renal 3 x 2 cm mass. No ureterolithiasis or hydroureter. Urinary bladder is decompressed with suprapubic catheter the noted to terminate within the urinary bladder wall lumen. Stomach/Bowel: Stomach is within normal limits. No evidence of bowel wall thickening or dilatation. Diffuse sigmoid diverticulosis. Appendix appears normal. Vascular/Lymphatic: No abdominal aorta or iliac aneurysm. Mild atherosclerotic plaque of the aorta and its branches. Status post pelvic lymph node dissection. Slightly limited evaluation of the pelvis due to streak artifact. Decreased in size 0.9 cm left periaortic lymph node (2:31). Stable minimally enlarged left inguinal lymph node (1.6 cm.) Reproductive: Status post prostatectomy Other: No intraperitoneal free fluid. No intraperitoneal free gas. No organized fluid collection. Musculoskeletal: No abdominal wall hernia or abnormality. Similar-appearing L4 sclerotic lesion. No acute displaced fracture. Multilevel degenerative changes of the spine. Grade  1 anterolisthesis of L5 on S1. Mild retrolisthesis of L2 on L3 and L3 on L4. IMPRESSION: 1. Decompressed urinary bladder in the setting of a suprapubic catheter. Markedly limited evaluation on this noncontrast study. 2. Interval increase in size of a right renal 3 x 2 cm mass. Finding concerning for renal cell carcinoma. Recommend MRI renal protocol for further evaluation. 3. Difficult to visualize possibly increased in size 1.6 cm (from 1.3 cm) pancreatic head lesion. This can further be evaluated on MRI. 4. Decreased in size 0.9 cm left periaortic lymph node. Stable minimally enlarged left inguinal lymph node. 5. Similar-appearing L4 sclerotic lesion. 6. Status post prostatectomy and pelvic lymph node dissection. 7. Diffuse sigmoid diverticulosis with no findings of acute diverticulitis. Electronically Signed   By: Iven Finn M.D.   On: 07/30/2021 20:24   DG Chest Port 1 View  Result Date: 07/30/2021 CLINICAL DATA:  UTI no EXAM: PORTABLE CHEST 1 VIEW COMPARISON:  None Available. FINDINGS: The heart size and mediastinal contours are within normal limits. Both lungs are clear. No pleural effusion or pneumothorax. Partially imaged right shoulder reverse arthroplasty. IMPRESSION: No acute process in the chest. Electronically Signed   By: Macy Mis M.D.   On: 07/30/2021 17:36        Scheduled Meds:  aspirin EC  81 mg Oral QHS   memantine  10 mg Oral BID   Continuous Infusions:  cefTRIAXone (ROCEPHIN)  IV Stopped (07/30/21 1801)   lactated ringers Stopped (07/31/21 1336)     LOS: 1 day    Time spent:40 min    Amere Bricco, Geraldo Docker, MD [facility]   If 7PM-7AM, please contact night-coverage 07/31/2021, 4:05 PM
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
## 2021-07-31 NOTE — ED Notes (Signed)
Pt has eaten his meal tray for the evening, NAD noted, no needs voiced at this time

## 2021-07-31 NOTE — ED Notes (Signed)
Secure message sent to inpatient RN Versie Starks, room currently being clean, pt is stable for transfer to inpatient bed once cleaned and RN accepts pt.

## 2021-08-01 ENCOUNTER — Telehealth: Payer: Self-pay | Admitting: Oncology

## 2021-08-01 DIAGNOSIS — N179 Acute kidney failure, unspecified: Secondary | ICD-10-CM | POA: Diagnosis not present

## 2021-08-01 DIAGNOSIS — I10 Essential (primary) hypertension: Secondary | ICD-10-CM | POA: Diagnosis not present

## 2021-08-01 DIAGNOSIS — D638 Anemia in other chronic diseases classified elsewhere: Secondary | ICD-10-CM | POA: Diagnosis not present

## 2021-08-01 DIAGNOSIS — A419 Sepsis, unspecified organism: Secondary | ICD-10-CM | POA: Diagnosis not present

## 2021-08-01 LAB — COMPREHENSIVE METABOLIC PANEL
ALT: 7 U/L (ref 0–44)
AST: 13 U/L — ABNORMAL LOW (ref 15–41)
Albumin: 2.4 g/dL — ABNORMAL LOW (ref 3.5–5.0)
Alkaline Phosphatase: 57 U/L (ref 38–126)
Anion gap: 8 (ref 5–15)
BUN: 13 mg/dL (ref 8–23)
CO2: 25 mmol/L (ref 22–32)
Calcium: 8.8 mg/dL — ABNORMAL LOW (ref 8.9–10.3)
Chloride: 102 mmol/L (ref 98–111)
Creatinine, Ser: 0.83 mg/dL (ref 0.61–1.24)
GFR, Estimated: >60 mL/min (ref >60)
Glucose, Bld: 114 mg/dL — ABNORMAL HIGH (ref 70–99)
Potassium: 3.6 mmol/L (ref 3.5–5.1)
Sodium: 135 mmol/L (ref 135–145)
Total Bilirubin: 0.6 mg/dL (ref 0.3–1.2)
Total Protein: 5.8 g/dL — ABNORMAL LOW (ref 6.5–8.1)

## 2021-08-01 LAB — CBC WITH DIFFERENTIAL/PLATELET
Abs Immature Granulocytes: 0.03 10*3/uL (ref 0.00–0.07)
Basophils Absolute: 0 10*3/uL (ref 0.0–0.1)
Basophils Relative: 0 %
Eosinophils Absolute: 0.2 10*3/uL (ref 0.0–0.5)
Eosinophils Relative: 3 %
HCT: 32 % — ABNORMAL LOW (ref 39.0–52.0)
Hemoglobin: 9.9 g/dL — ABNORMAL LOW (ref 13.0–17.0)
Immature Granulocytes: 0 %
Lymphocytes Relative: 21 %
Lymphs Abs: 1.5 10*3/uL (ref 0.7–4.0)
MCH: 26.6 pg (ref 26.0–34.0)
MCHC: 30.9 g/dL (ref 30.0–36.0)
MCV: 86 fL (ref 80.0–100.0)
Monocytes Absolute: 1 10*3/uL (ref 0.1–1.0)
Monocytes Relative: 14 %
Neutro Abs: 4.4 10*3/uL (ref 1.7–7.7)
Neutrophils Relative %: 62 %
Platelets: 263 10*3/uL (ref 150–400)
RBC: 3.72 MIL/uL — ABNORMAL LOW (ref 4.22–5.81)
RDW: 14.5 % (ref 11.5–15.5)
WBC: 7.2 10*3/uL (ref 4.0–10.5)
nRBC: 0 % (ref 0.0–0.2)

## 2021-08-01 LAB — URINE CULTURE: Culture: >=100000 — AB

## 2021-08-01 LAB — PHOSPHORUS: Phosphorus: 3.1 mg/dL (ref 2.5–4.6)

## 2021-08-01 LAB — MAGNESIUM: Magnesium: 2 mg/dL (ref 1.7–2.4)

## 2021-08-01 MED ORDER — SODIUM CHLORIDE 0.9 % IV SOLN
1.0000 g | Freq: Two times a day (BID) | INTRAVENOUS | Status: DC
  Administered 2021-08-01 (×2): 1 g via INTRAVENOUS
  Filled 2021-08-01 (×3): qty 20

## 2021-08-01 MED ORDER — AMLODIPINE BESYLATE 5 MG PO TABS
2.5000 mg | ORAL_TABLET | Freq: Every day | ORAL | Status: DC
  Administered 2021-08-01: 2.5 mg via ORAL
  Filled 2021-08-01: qty 1

## 2021-08-01 MED ORDER — ENSURE ENLIVE PO LIQD
237.0000 mL | Freq: Two times a day (BID) | ORAL | Status: DC
  Administered 2021-08-02 – 2021-08-08 (×12): 237 mL via ORAL

## 2021-08-01 MED ORDER — SODIUM CHLORIDE 0.9 % IV SOLN: INTRAVENOUS | Status: DC

## 2021-08-01 MED ORDER — POTASSIUM CHLORIDE CRYS ER 20 MEQ PO TBCR
50.0000 meq | EXTENDED_RELEASE_TABLET | Freq: Once | ORAL | Status: AC
  Administered 2021-08-01: 50 meq via ORAL
  Filled 2021-08-01: qty 1

## 2021-08-01 NOTE — Progress Notes (Signed)
PROGRESS NOTE    Andre Casey.  ZOX:[id number] DOB: 1929/08/01 DOA: 07/30/2021 PCP: [name], MD     Brief Narrative:   88 y.o. WM PMHx prostate cancer status post radiation and Lupron injections, essential HTN, CAD s/p PCI with stent placement x2 in November 2017, anemia of chronic disease associated baseline globin 9-12, Renal mass,   Admitted to [facility] on 07/30/2021 with severe sepsis due to urinary tract infection after presenting from home to [facility] ED complaining of fever.    The patient, lives at home, reports 1 day of objective fever, noting temperature max of 103 at home over that timeframe.  He notes associated chills in the absence of full body rigors or generalized myalgias.   Denies any recent headache, neck stiffness, rhinitis, rhinorrhea, sore throat, sob, wheezing, cough, nausea, vomiting, abdominal pain, diarrhea, or rash. No recent traveling or known COVID-19 exposures.  In the context of a history of prostate cancer he underwent suprapubic catheter placement on 05/16/2021.  Patient remains worsening cloudy appearance of associated urine over the last few days.   Denies any recent chest pain, diaphoresis, or palpitations.  No recent lower extremity erythema, calf tenderness, or worsening peripheral edema.   Of note, the patient reportedly recently had an superficial abscess surrounding his suprapubic catheter, for which she underwent IR aspiration on 07/05/2021.   Subjective: 6/8 afebrile overnight A/O x1 (does not know where, when, why) follows all commands   Assessment & Plan: Covid vaccination;   Principal Problem:   Severe sepsis ([place]) Active Problems:   Essential hypertension   Hypokalemia   Acute lower UTI   AKI (acute kidney injury) ([place])   Anemia of chronic disease  Severe sepsis/Complicated UTI -Continue current antibiotic x14 days - 6/7 patient unsure of last time suprapubic catheter changed.  Research EMR, appears quite  old. -LR 137m/hr -6/8 DC LR,---> normal saline 1047mhr  Hypokalemia - Potassium goal> 4 - 6/8 K-Dur 50 mEq  AKI (baseline Cr 0.97 on 5/17) -See sepsis  Lab Results  Component Value Date   CREATININE 0.83 08/01/2021   CREATININE 0.79 07/31/2021   CREATININE 1.36 (H) 07/30/2021   CREATININE 0.97 07/10/2021   CREATININE 0.93 07/09/2021  -6/7 resolved  Essential HTN -6/8 amlodipine 2.5 mg daily  Anemia of chronic disease (baseline HgB 9-12) Lab Results  Component Value Date   HGB 9.9 (L) 08/01/2021   HGB 10.3 (L) 07/31/2021   HGB 11.7 (L) 07/30/2021   HGB 10.1 (L) 07/10/2021   HGB 9.4 (L) 07/09/2021  -Stable    Goals of care - 6/8 PT/OT consult:   Elderly patient with complicated UTI evaluate for CIR vs SNF.  Work with patient daily on strengthening and ROM -6/8 out of bed to chair every shift     Mobility Assessment (last 72 hours)     Mobility Assessment     Row Name 07/31/21 2200           Does patient have an order for bedrest or is patient medically unstable No - Continue assessment       What is the highest level of mobility based on the progressive mobility assessment? Level 5 (Walks with assist in room/hall) - Balance while stepping forward/back and can walk in room with assist - Complete                  Interdisciplinary Goals of Care Family Meeting   Date carried out: 08/01/2021  Location of the meeting:  Member's involved:   Durable Power of Tour manager:     Discussion: We discussed goals of care for [employer]. .    Code status:   Disposition:   Time spent for the meeting:     Allie Bossier, MD  08/01/2021, 2:59 PM         DVT prophylaxis: SCD Code Status: DNR Family Communication:  Status is: Inpatient    Dispo: The patient is from: Home              Anticipated d/c is to: Home              Anticipated d/c date is: 3 days              Patient currently is not  medically stable to d/c.      Consultants:    Procedures/Significant Events:    I have personally reviewed and interpreted all radiology studies and my findings are as above.  VENTILATOR SETTINGS:    Cultures   Antimicrobials: Anti-infectives (From admission, onward)    Start     Ordered Stop   07/30/21 1730  cefTRIAXone (ROCEPHIN) 2 g in sodium chloride 0.9 % 100 mL IVPB        07/30/21 1719 08/06/21 1729         Devices    LINES / TUBES:      Continuous Infusions:  lactated ringers 100 mL/hr at 08/01/21 0045   meropenem (MERREM) IV 1 g (08/01/21 1101)     Objective: Vitals:   07/31/21 2245 08/01/21 0336 08/01/21 0500 08/01/21 1328  BP: 127/72 (!) 154/85  (!) 141/84  Pulse: 73 70  80  Resp: 18 18    Temp: 98.2 F (36.8 C) 97.6 F (36.4 C)  98.8 F (37.1 C)  TempSrc: Oral Oral  Oral  SpO2: 98% 100%  97%  Weight:   55 kg   Height:        Intake/Output Summary (Last 24 hours) at 08/01/2021 1459 Last data filed at 08/01/2021 0550 Gross per 24 hour  Intake 1784.23 ml  Output 2550 ml  Net -765.77 ml    Filed Weights   07/31/21 0714 08/01/21 0500  Weight: 51.3 kg 55 kg    Examination:  General: A/O x1 (does not know where, when, why) follows all commands, No acute respiratory distress Eyes: negative scleral hemorrhage, negative anisocoria, negative icterus ENT: Negative Runny nose, negative gingival bleeding, Neck:  Negative scars, masses, torticollis, lymphadenopathy, JVD Lungs: Clear to auscultation bilaterally without wheezes or crackles Cardiovascular: Regular rate and rhythm without murmur gallop or rub normal S1 and S2 Abdomen: negative abdominal pain, nondistended, positive soft, bowel sounds, no rebound, no ascites, no appreciable mass, suprapubic cath present appears old.  Negative frank pus around suprapubic cath. Extremities: No significant cyanosis, clubbing, or edema bilateral lower extremities Skin: Negative rashes, lesions,  ulcers Psychiatric:  Negative depression, negative anxiety, negative fatigue, negative mania  Central nervous system:  Cranial nerves II through XII intact, tongue/uvula midline, all extremities muscle strength 5/5, sensation intact throughout, negative dysarthria, negative expressive aphasia, negative receptive aphasia.  .     Data Reviewed: Care during the described time interval was provided by me .  I have reviewed this patient's available data, including medical history, events of note, physical examination, and all test results as part of my evaluation.  CBC: Recent Labs  Lab 07/30/21 1718 07/31/21 0555 08/01/21 0505  WBC 13.1* 9.3  7.2  NEUTROABS 9.1* 5.2 4.4  HGB 11.7* 10.3* 9.9*  HCT 37.9* 32.2* 32.0*  MCV 86.3 86.3 86.0  PLT 386 256 701    Basic Metabolic Panel: Recent Labs  Lab 07/30/21 1718 07/31/21 0555 08/01/21 0505  NA 136 136 135  K 3.2* 4.2 3.6  CL 101 100 102  CO2 '22 26 25  '$ GLUCOSE 167* 112* 114*  BUN 24* 16 13  CREATININE 1.36* 0.79 0.83  CALCIUM 9.1 8.8* 8.8*  MG  --  2.0 2.0  PHOS  --   --  3.1    GFR: Estimated Creatinine Clearance: 46.9 mL/min (by C-G formula based on SCr of 0.83 mg/dL). Liver Function Tests: Recent Labs  Lab 07/30/21 1718 07/31/21 0555 08/01/21 0505  AST 20 13* 13*  ALT '9 8 7  '$ ALKPHOS 67 57 57  BILITOT 1.0 0.7 0.6  PROT 7.5 6.2* 5.8*  ALBUMIN 3.4* 2.6* 2.4*    No results for input(s): "LIPASE", "AMYLASE" in the last 168 hours. No results for input(s): "AMMONIA" in the last 168 hours. Coagulation Profile: Recent Labs  Lab 07/30/21 1718  INR 1.2    Cardiac Enzymes: No results for input(s): "CKTOTAL", "CKMB", "CKMBINDEX", "TROPONINI" in the last 168 hours. BNP (last 3 results) No results for input(s): "PROBNP" in the last 8760 hours. HbA1C: No results for input(s): "HGBA1C" in the last 72 hours. CBG: No results for input(s): "GLUCAP" in the last 168 hours. Lipid Profile: No results for input(s): "CHOL",  "HDL", "LDLCALC", "TRIG", "CHOLHDL", "LDLDIRECT" in the last 72 hours. Thyroid Function Tests: No results for input(s): "TSH", "T4TOTAL", "FREET4", "T3FREE", "THYROIDAB" in the last 72 hours. Anemia Panel: No results for input(s): "VITAMINB12", "FOLATE", "FERRITIN", "TIBC", "IRON", "RETICCTPCT" in the last 72 hours. Sepsis Labs: Recent Labs  Lab 07/30/21 1718 07/30/21 1918 07/31/21 0555  LATICACIDVEN 5.1* 2.5* 1.4     Recent Results (from the past 240 hour(s))  Culture, blood (x 2)     Status: None (Preliminary result)   Collection Time: 07/30/21  4:45 PM   Specimen: BLOOD  Result Value Ref Range Status   Specimen Description   Final    BLOOD RIGHT ANTECUBITAL Performed at [facility], [place] [address] [place], [place] [address]    Special Requests   Final    BOTTLES DRAWN AEROBIC AND ANAEROBIC Blood Culture results may not be optimal due to an excessive volume of blood received in culture bottles Performed at [place] [address], [place], [place] [address]    Culture   Final    NO GROWTH 2 DAYS Performed at [place] [address] [place], [place] [address]    Report Status PENDING  Incomplete  Culture, blood (x 2)     Status: None (Preliminary result)   Collection Time: 07/30/21  5:00 PM   Specimen: BLOOD RIGHT ARM  Result Value Ref Range Status   Specimen Description   Final    BLOOD RIGHT ARM Performed at [place] [address], [place], [place] [address]    Special Requests   Final    BOTTLES DRAWN AEROBIC AND ANAEROBIC Blood Culture results may not be optimal due to an excessive volume of blood received in culture bottles Performed at [place] [address], [place], [place] [address]    Culture   Final    NO GROWTH 2 DAYS Performed at [place] [address], [place], [place] [address]  Report Status PENDING  Incomplete  Urine Culture      Status: Abnormal   Collection Time: 07/30/21  6:58 PM   Specimen: Urine, Catheterized  Result Value Ref Range Status   Specimen Description   Final    URINE, CATHETERIZED Performed at [place] [address] [place], [place] [address]    Special Requests   Final    NONE Performed at [facility], [place] [address], [place], [place] [address]    Culture (A)  Final    >=100,000 COLONIES/mL KLEBSIELLA PNEUMONIAE Confirmed Extended Spectrum Beta-Lactamase Producer (ESBL).  In bloodstream infections from ESBL organisms, carbapenems are preferred over piperacillin/tazobactam. They are shown to have a lower risk of mortality.    Report Status 08/01/2021 FINAL  Final   Organism ID, Bacteria KLEBSIELLA PNEUMONIAE (A)  Final      Susceptibility   Klebsiella pneumoniae - MIC*    AMPICILLIN >=32 RESISTANT Resistant     CEFAZOLIN >=64 RESISTANT Resistant     CEFEPIME 0.5 SENSITIVE Sensitive     CEFTRIAXONE 8 RESISTANT Resistant     CIPROFLOXACIN 1 RESISTANT Resistant     GENTAMICIN >=16 RESISTANT Resistant     IMIPENEM <=0.25 SENSITIVE Sensitive     NITROFURANTOIN 64 INTERMEDIATE Intermediate     TRIMETH/SULFA >=320 RESISTANT Resistant     AMPICILLIN/SULBACTAM 8 SENSITIVE Sensitive     PIP/TAZO <=4 SENSITIVE Sensitive     * >=100,000 COLONIES/mL KLEBSIELLA PNEUMONIAE  Aerobic Culture w Gram Stain (superficial specimen)     Status: None (Preliminary result)   Collection Time: 07/30/21  7:56 PM   Specimen: Skin, Cyst/Tag/Debridement  Result Value Ref Range Status   Specimen Description   Final    SKIN ABDOMINAL WALL SUPRAPUBIC CATHETER INSERTION SITE Performed at [place] [address] [place], [place] [address]    Special Requests   Final    NONE Performed at [facility], [place] [address], [place], [place] [address]    Gram Stain   Final    NO SQUAMOUS EPITHELIAL CELLS SEEN FEW GRAM POSITIVE RODS FEW  GRAM NEGATIVE RODS FEW GRAM POSITIVE COCCI NO WBC SEEN Performed at [facility], [address], [place], [place] [address]    Culture FEW GRAM NEGATIVE RODS  Final   Report Status PENDING  Incomplete         Radiology Studies: CT ABDOMEN PELVIS WO CONTRAST  Result Date: 07/30/2021 CLINICAL DATA:  Abdominal pain, acute, nonlocalized pus drainage from supra pubic cath site EXAM: CT ABDOMEN AND PELVIS WITHOUT CONTRAST TECHNIQUE: Multidetector CT imaging of the abdomen and pelvis was performed following the standard protocol without IV contrast. RADIATION DOSE REDUCTION: This exam was performed according to the departmental dose-optimization program which includes automated exposure control, adjustment of the mA and/or kV according to patient size and/or use of iterative reconstruction technique. COMPARISON:  CT abdomen pelvis 07/03/2021 FINDINGS: Lower chest: No acute abnormality. Hepatobiliary: No focal liver abnormality. No gallstones, gallbladder wall thickening, or pericholecystic fluid. No biliary dilatation. Pancreas: Difficult to visualize possibly increased in size 1.6 cm (from 1.3 cm) pancreatic head lesion (2:24). Normal pancreatic contour. No surrounding inflammatory changes. No main pancreatic ductal dilatation. Spleen: Normal in size without focal abnormality. Adrenals/Urinary Tract: No adrenal nodule bilaterally. No nephrolithiasis and no hydronephrosis. Interval increase in size of a right renal 3 x 2 cm mass. No ureterolithiasis or hydroureter. Urinary bladder is decompressed with suprapubic catheter the noted to terminate  within the urinary bladder wall lumen. Stomach/Bowel: Stomach is within normal limits. No evidence of bowel wall thickening or dilatation. Diffuse sigmoid diverticulosis. Appendix appears normal. Vascular/Lymphatic: No abdominal aorta or iliac aneurysm. Mild atherosclerotic plaque of the aorta and its branches. Status post pelvic lymph node dissection. Slightly  limited evaluation of the pelvis due to streak artifact. Decreased in size 0.9 cm left periaortic lymph node (2:31). Stable minimally enlarged left inguinal lymph node (1.6 cm.) Reproductive: Status post prostatectomy Other: No intraperitoneal free fluid. No intraperitoneal free gas. No organized fluid collection. Musculoskeletal: No abdominal wall hernia or abnormality. Similar-appearing L4 sclerotic lesion. No acute displaced fracture. Multilevel degenerative changes of the spine. Grade 1 anterolisthesis of L5 on S1. Mild retrolisthesis of L2 on L3 and L3 on L4. IMPRESSION: 1. Decompressed urinary bladder in the setting of a suprapubic catheter. Markedly limited evaluation on this noncontrast study. 2. Interval increase in size of a right renal 3 x 2 cm mass. Finding concerning for renal cell carcinoma. Recommend MRI renal protocol for further evaluation. 3. Difficult to visualize possibly increased in size 1.6 cm (from 1.3 cm) pancreatic head lesion. This can further be evaluated on MRI. 4. Decreased in size 0.9 cm left periaortic lymph node. Stable minimally enlarged left inguinal lymph node. 5. Similar-appearing L4 sclerotic lesion. 6. Status post prostatectomy and pelvic lymph node dissection. 7. Diffuse sigmoid diverticulosis with no findings of acute diverticulitis. Electronically Signed   By: Iven Finn M.D.   On: 07/30/2021 20:24   DG Chest Port 1 View  Result Date: 07/30/2021 CLINICAL DATA:  UTI no EXAM: PORTABLE CHEST 1 VIEW COMPARISON:  None Available. FINDINGS: The heart size and mediastinal contours are within normal limits. Both lungs are clear. No pleural effusion or pneumothorax. Partially imaged right shoulder reverse arthroplasty. IMPRESSION: No acute process in the chest. Electronically Signed   By: Macy Mis M.D.   On: 07/30/2021 17:36        Scheduled Meds:  acetaminophen  1,000 mg Oral TID   aspirin EC  81 mg Oral QHS   feeding supplement  237 mL Oral BID BM   memantine   10 mg Oral BID   Continuous Infusions:  lactated ringers 100 mL/hr at 08/01/21 0045   meropenem (MERREM) IV 1 g (08/01/21 1101)     LOS: 2 days    Time spent:40 min    Azeem Poorman, Geraldo Docker, MD [facility]   If 7PM-7AM, please contact night-coverage 08/01/2021, 2:59 PM

## 2021-08-01 NOTE — Progress Notes (Signed)
Initial Nutrition Assessment  DOCUMENTATION CODES:   Severe malnutrition in context of chronic illness  INTERVENTION:  - will order Ensure Plus High Protein BID, each supplement provides 350 kcal and 20 grams of protein.  - will enter d/c nutrition smart phrase in AVS--outlines recommendation for oral nutrition supplement (such as Ensure Plus) BID for at least 1 month after d/c.    NUTRITION DIAGNOSIS:   Severe Malnutrition related to chronic illness, cancer and cancer related treatments as evidenced by severe fat depletion, severe muscle depletion.  GOAL:   Patient will meet greater than or equal to 90% of their needs  MONITOR:   PO intake, Supplement acceptance, Labs, Weight trends  REASON FOR ASSESSMENT:   Consult Assessment of nutrition requirement/status  ASSESSMENT:   88 y.o. male with medical history of prostate cancer s/p radiation and Lupron injections and s/p suprapubic catheter placement on 05/16/21, essential HTN, CAD s/p PCI with stent placement x2 in 11/ 2017, anemia of chronic disease, renal mass, hypercholesterolemia, memory impairment, and arthritis. He was admitted on 6/6 due to severe sepsis d/t UTI. He presented from home due to fever up to 103 degrees.  Patient sitting up in bed eating "brunch" of pineapple, potatoes, sausage, orange juice, and coffee with 2 packets of sugar. He is missing several bottom teeth. He denies any chewing or swallowing issues.  He lives alone and shares that his adult children express concern that he is not eating enough. Patient shares that appetite is not what it used to be and that since the passing of his wife he has not eaten as much. He shares that one of his granddaughters is very involved in his life and visits and helps him often.  He is able to walk independently without the assistance of cane or walker.   Patient is very agreeable to Ensure order.  Weight today is 121 lb and weight on 07/04/21 was 128 lb. This  indicates 7 lb weight loss (5.5% body weight) in the past 1 month.    Labs reviewed. Medications reviewed. IVF; LR @ 100 ml/hr.    NUTRITION - FOCUSED PHYSICAL EXAM:  Flowsheet Row Most Recent Value  Orbital Region Severe depletion  Upper Arm Region Severe depletion  Thoracic and Lumbar Region Unable to assess  Buccal Region Severe depletion  Temple Region Moderate depletion  Clavicle Bone Region Severe depletion  Clavicle and Acromion Bone Region Severe depletion  Scapular Bone Region Severe depletion  Dorsal Hand Severe depletion  Patellar Region Moderate depletion  Anterior Thigh Region Severe depletion  Posterior Calf Region Severe depletion  Edema (RD Assessment) Mild  [BLE]  Hair Reviewed  Eyes Reviewed  Mouth Reviewed  Skin Reviewed  Nails Reviewed       Diet Order:   Diet Order             Diet regular Room service appropriate? Yes; Fluid consistency: Thin  Diet effective now                   EDUCATION NEEDS:   Education needs have been addressed  Skin:  Skin Assessment: Reviewed RN Assessment  Last BM:  6/8 (type 3 x1, small amount)  Height:   Ht Readings from Last 1 Encounters:  07/31/21 '5\' 7"'$  (1.702 m)    Weight:   Wt Readings from Last 1 Encounters:  08/01/21 55 kg     BMI:  Body mass index is 18.99 kg/m.  Estimated Nutritional Needs:  Kcal:  1750-2000 kcal Protein:  85-100 grams Fluid:  >/= 2 L/day     Jarome Matin, MS, RD, LDN Registered Dietitian II Inpatient Clinical Nutrition RD pager # and on-call/weekend pager # available in [facility]

## 2021-08-01 NOTE — Discharge Instructions (Signed)
[facility] Stay Proper nutrition can help your body recover from illness and injury.   Foods and beverages high in protein, vitamins, and minerals help rebuild muscle loss, promote healing, & reduce fall risk.   In addition to eating healthy foods, a nutrition shake is an easy, delicious way to get the nutrition you need during and after your hospital stay  It is recommended that you continue to drink 2 bottles per day of: Ensure Plus or similar for at least 1 month (30 days) after your hospital stay   Tips for adding a nutrition shake into your routine: As allowed, drink one with vitamins or medications instead of water or juice Enjoy one as a tasty mid-morning or afternoon snack Drink cold or make a milkshake out of it Drink one instead of milk with cereal or snacks Use as a coffee creamer   Available at the following grocery stores and pharmacies:           * [place] [phone]            For COUPONS visit: www.ensure.com/join or [email or web address]-[email or web address]   Suggested Substitutions Ensure Plus = Boost Plus = Carnation Breakfast Essentials = Boost Compact Ensure Active Clear = Boost Breeze Glucerna Shake = Boost Glucose Control = Carnation Breakfast Essentials SUGAR FREE

## 2021-08-01 NOTE — Telephone Encounter (Signed)
Called patient regarding upcoming July appointment, patient has been called and notified. 

## 2021-08-02 DIAGNOSIS — D638 Anemia in other chronic diseases classified elsewhere: Secondary | ICD-10-CM | POA: Diagnosis not present

## 2021-08-02 DIAGNOSIS — N179 Acute kidney failure, unspecified: Secondary | ICD-10-CM | POA: Diagnosis not present

## 2021-08-02 DIAGNOSIS — I10 Essential (primary) hypertension: Secondary | ICD-10-CM | POA: Diagnosis not present

## 2021-08-02 DIAGNOSIS — A419 Sepsis, unspecified organism: Secondary | ICD-10-CM | POA: Diagnosis not present

## 2021-08-02 LAB — COMPREHENSIVE METABOLIC PANEL
ALT: 9 U/L (ref 0–44)
AST: 13 U/L — ABNORMAL LOW (ref 15–41)
Albumin: 2.3 g/dL — ABNORMAL LOW (ref 3.5–5.0)
Alkaline Phosphatase: 56 U/L (ref 38–126)
Anion gap: 6 (ref 5–15)
BUN: 13 mg/dL (ref 8–23)
CO2: 24 mmol/L (ref 22–32)
Calcium: 8.5 mg/dL — ABNORMAL LOW (ref 8.9–10.3)
Chloride: 111 mmol/L (ref 98–111)
Creatinine, Ser: 0.7 mg/dL (ref 0.61–1.24)
GFR, Estimated: >60 mL/min (ref >60)
Glucose, Bld: 97 mg/dL (ref 70–99)
Potassium: 4 mmol/L (ref 3.5–5.1)
Sodium: 141 mmol/L (ref 135–145)
Total Bilirubin: 0.5 mg/dL (ref 0.3–1.2)
Total Protein: 5.5 g/dL — ABNORMAL LOW (ref 6.5–8.1)

## 2021-08-02 LAB — CBC WITH DIFFERENTIAL/PLATELET
Abs Immature Granulocytes: 0.02 10*3/uL (ref 0.00–0.07)
Basophils Absolute: 0 10*3/uL (ref 0.0–0.1)
Basophils Relative: 0 %
Eosinophils Absolute: 0.3 10*3/uL (ref 0.0–0.5)
Eosinophils Relative: 5 %
HCT: 28.4 % — ABNORMAL LOW (ref 39.0–52.0)
Hemoglobin: 8.9 g/dL — ABNORMAL LOW (ref 13.0–17.0)
Immature Granulocytes: 0 %
Lymphocytes Relative: 24 %
Lymphs Abs: 1.4 10*3/uL (ref 0.7–4.0)
MCH: 26.9 pg (ref 26.0–34.0)
MCHC: 31.3 g/dL (ref 30.0–36.0)
MCV: 85.8 fL (ref 80.0–100.0)
Monocytes Absolute: 0.6 10*3/uL (ref 0.1–1.0)
Monocytes Relative: 11 %
Neutro Abs: 3.4 10*3/uL (ref 1.7–7.7)
Neutrophils Relative %: 60 %
Platelets: 348 10*3/uL (ref 150–400)
RBC: 3.31 MIL/uL — ABNORMAL LOW (ref 4.22–5.81)
RDW: 14.3 % (ref 11.5–15.5)
WBC: 5.8 10*3/uL (ref 4.0–10.5)
nRBC: 0 % (ref 0.0–0.2)

## 2021-08-02 LAB — MAGNESIUM: Magnesium: 2 mg/dL (ref 1.7–2.4)

## 2021-08-02 LAB — PHOSPHORUS: Phosphorus: 3 mg/dL (ref 2.5–4.6)

## 2021-08-02 MED ORDER — SODIUM CHLORIDE 0.9 % IV SOLN
1.0000 g | Freq: Three times a day (TID) | INTRAVENOUS | Status: DC
  Administered 2021-08-02 – 2021-08-08 (×19): 1 g via INTRAVENOUS
  Filled 2021-08-02 (×10): qty 20
  Filled 2021-08-02: qty 1
  Filled 2021-08-02 (×11): qty 20

## 2021-08-02 MED ORDER — AMLODIPINE BESYLATE 5 MG PO TABS
5.0000 mg | ORAL_TABLET | Freq: Every day | ORAL | Status: DC
  Administered 2021-08-02 – 2021-08-03 (×2): 5 mg via ORAL
  Filled 2021-08-02 (×2): qty 1

## 2021-08-02 NOTE — Progress Notes (Signed)
PHARMACY NOTE:  ANTIMICROBIAL RENAL DOSAGE ADJUSTMENT  Current antimicrobial regimen includes a mismatch between antimicrobial dosage and estimated renal function.  As per policy approved by the Pharmacy & Therapeutics and Medical Executive Committees, the antimicrobial dosage will be adjusted accordingly.  Current antimicrobial dosage:  Meropenem 1g IV q12h  Indication: ESBL infection  Renal Function:  Estimated Creatinine Clearance: 54 mL/min (by C-G formula based on SCr of 0.7 mg/dL). '[]'$      On intermittent HD, scheduled: '[]'$      On CRRT    Antimicrobial dosage has been changed to:  1g IV q8h  Additional comments:   Thank you for allowing pharmacy to be a part of this patient's care.  Peggyann Juba, PharmD, BCPS Pharmacy: [phone] 08/02/2021 8:37 AM

## 2021-08-02 NOTE — Care Management Important Message (Signed)
Important Message  Patient Details IM Letter placed in Patient room. Name: Andre Casey. MRN: [id number] Date of Birth: July 26, 1929   Medicare Important Message Given:  Yes     Kerin Salen 08/02/2021, 12:16 PM

## 2021-08-02 NOTE — Evaluation (Signed)
Physical Therapy Evaluation Patient Details Name: Andre Casey. MRN: [id number] DOB: [birth year] Today's Date: 08/02/2021  History of Present Illness  Andre Symmonds. is a 88 y.o. male admitted with sepsis due to UTI.  Medical history significant of metastatic prostate cancer, right renal mass/pancreatic mass, physical deconditioning, urinary incontinence with indwelling suprapubic catheter, penile prosthesis implant with recent removal due to urinary retention on 05/16/21) CKD, Alzheimers, HTN, recent Covid 19 infection (04/15/21), normocytic anemia and CAD   Clinical Impression  Pt presents to ED from home with mobility and balance deficits due to above HPI. He ambulated 60 ft in the hallway with a RW requiring Min Assist. PLOF pt was independent living and driving. Pt is limited by overall weakness and pain, needing Mod assist for transfers. Pt requires Bil UE support to maintain balance. Recommending continued skilled therapy so pt is safe upon returning home.      Recommendations for follow up therapy are one component of a multi-disciplinary discharge planning process, led by the attending physician.  Recommendations may be updated based on patient status, additional functional criteria and insurance authorization.  PT Recommendation   Follow Up Recommendations Skilled nursing-short term rehab (<3 hours/day) Filed 08/02/2021 1132  Assistance recommended at discharge Frequent or constant Supervision/Assistance Filed 08/02/2021 1132  Patient can return home with the following A lot of help with walking and/or transfers, A lot of help with bathing/dressing/bathroom, Assistance with cooking/housework, Direct supervision/assist for medications management, Assist for transportation, Help with stairs or ramp for entrance [facility] 08/02/2021 1132  Functional Status Assessment Patient has had a recent decline in their functional status and demonstrates the ability to make significant  improvements in function in a reasonable and predictable amount of time. Filed 08/02/2021 1132  PT equipment None recommended by PT Filed 08/02/2021 1132    Precautions / Restrictions Precautions Precautions: Fall Precaution Comments: Painful knees Required Braces or Orthoses: Splint/Cast Splint/Cast: Rt forearm brace Restrictions Weight Bearing Restrictions: No      Mobility  Bed Mobility                    Transfers Overall transfer level: Needs assistance Equipment used: Rolling walker (2 wheels) Transfers: Sit to/from Stand Sit to Stand: Mod assist           General transfer comment: Pt required trunk lift assist from therapist for standing and VC's for RW and hand placement for toliet to sink.    Ambulation/Gait Ambulation/Gait assistance: Min assist Gait Distance (Feet): 60 Feet Assistive device: Rolling walker (2 wheels) Gait Pattern/deviations: Step-through pattern, Decreased stride length, Trunk flexed, Narrow base of support Gait velocity: Fair     General Gait Details: Pt ambulated in hallway with Min assist, required VC's for safety with gait velocity and body mechanics- pt demonstrated significant flexed trunk.  Stairs            Wheelchair Mobility    Modified Rankin (Stroke Patients Only)       Balance Overall balance assessment: Needs assistance Sitting-balance support: No upper extremity supported Sitting balance-Leahy Scale: Good   Postural control:  (Forward lean) Standing balance support: Bilateral upper extremity supported, During functional activity, Reliant on assistive device for balance Standing balance-Leahy Scale: Fair Standing balance comment: Pt maintains balance in standing but flexes trunk significantly.                             Pertinent Vitals/Pain Pain Assessment  Pain Assessment: 0-10 Pain Score: 5  Pain Location: Rt knee Pain Descriptors / Indicators: Discomfort, Sharp Pain  Intervention(s): Monitored during session    Home Living Family/patient expects to be discharged to:: Private residence Living Arrangements: Alone Available Help at Discharge: Available PRN/intermittently;Family (grandauhter) Type of Home: House Home Access: Ramped entrance Entrance Stairs-Rails: Right;Left Entrance Stairs-Number of Steps: 4   Home Layout: One level Home Equipment: (P) Rolling Walker (2 wheels);Cane - single point;Wheelchair - manual;BSC/3in1;Grab bars - tub/shower;Grab bars - toilet;Shower seat - built in Additional Comments: has a granddaughter-in-law that checks on him at least almost daily either in person or by phone, brings meals for couple days. Eats frozen meals when meals not provided for him. Has neighbor 1 block away that he reports he can call on in emergency, they talk over the phone almost every day.    Prior Function Prior Level of Function : Needs assist       Physical Assist : ADLs (physical)   ADLs (physical): IADLs Mobility Comments: Denies fall in past 6 mo. Typically ambulated without AD ADLs Comments: Reprots granddaughter assists with meal prep and other IADLs as needed. Pt reports that he does drive but family has been asking him to stop.     Hand Dominance   Dominant Hand: Right    Extremity/Trunk Assessment   Upper Extremity Assessment Upper Extremity Assessment: Generalized weakness RUE Deficits / Details: WFL ROM, reports right wrist pain, has wrist splint LUE Deficits / Details: WFL ROM and strength grossly 4-/5    Lower Extremity Assessment Lower Extremity Assessment: Defer to PT evaluation RLE Deficits / Details: Rt knee pain, esp during knee extension.    Cervical / Trunk Assessment Cervical / Trunk Assessment: Kyphotic  Communication   Communication: HOH  Cognition Arousal/Alertness: Awake/alert Behavior During Therapy: WFL for tasks assessed/performed Overall Cognitive Status: Within Functional Limits for tasks  assessed                                 General Comments: Positive attitude, pt reports some memory loss        General Comments      Exercises     Assessment/Plan    PT Assessment Patient needs continued PT services  PT Problem List Decreased strength;Decreased activity tolerance;Decreased balance;Decreased mobility       PT Treatment Interventions      PT Goals (Current goals can be found in the Care Plan section)       Frequency  2x/week     End of Session Equipment Utilized During Treatment: Gait belt Activity Tolerance: Patient tolerated treatment well Patient left: in chair;with call bell/phone within reach;with chair alarm set Nurse Communication: Mobility status PT Visit Diagnosis: Unsteadiness on feet (R26.81);Other abnormalities of gait and mobility (R26.89);Muscle weakness (generalized) (M62.81);Pain Pain - Right/Left: Right Pain - part of body: Knee    Time: 1122-1150 PT Time Calculation (min) (ACUTE ONLY): 28 min   Charges:   PT Evaluation $PT Eval Moderate Complexity: 1 Mod PT Treatments $Gait Training: 8-22 mins        Margie Ege, SPT [place] 08/02/2021, 12:47 PM

## 2021-08-02 NOTE — Progress Notes (Signed)
PROGRESS NOTE    Andre Casey.  FYB:[id number] DOB: 12-Nov-1929 DOA: 07/30/2021 PCP: [name], MD     Brief Narrative:   88 y.o. WM PMHx prostate cancer status post radiation and Lupron injections, essential HTN, CAD s/p PCI with stent placement x2 in November 2017, anemia of chronic disease associated baseline globin 9-12, Renal mass,   Admitted to [facility] on 07/30/2021 with severe sepsis due to urinary tract infection after presenting from home to [facility] ED complaining of fever.    The patient, lives at home, reports 1 day of objective fever, noting temperature max of 103 at home over that timeframe.  He notes associated chills in the absence of full body rigors or generalized myalgias.   Denies any recent headache, neck stiffness, rhinitis, rhinorrhea, sore throat, sob, wheezing, cough, nausea, vomiting, abdominal pain, diarrhea, or rash. No recent traveling or known COVID-19 exposures.  In the context of a history of prostate cancer he underwent suprapubic catheter placement on 05/16/2021.  Patient remains worsening cloudy appearance of associated urine over the last few days.   Denies any recent chest pain, diaphoresis, or palpitations.  No recent lower extremity erythema, calf tenderness, or worsening peripheral edema.   Of note, the patient reportedly recently had an superficial abscess surrounding his suprapubic catheter, for which she underwent IR aspiration on 07/05/2021.   Subjective: 6/9 afebrile overnight sleeping comfortably but arousable.   Assessment & Plan: Covid vaccination;   Principal Problem:   Severe sepsis ([place]) Active Problems:   Essential hypertension   Hypokalemia   Acute lower UTI   AKI (acute kidney injury) ([place])   Anemia of chronic disease  Severe sepsis/Complicated UTI -Continue current antibiotic x14 days - 6/7 patient unsure of last time suprapubic catheter changed.  Research EMR, appears quite old. -LR 175m/hr -6/8 DC  LR,---> normal saline 1055mhr  Hypokalemia - Potassium goal> 4 - 6/8 K-Dur 50 mEq  AKI (baseline Cr 0.97 on 5/17) -See sepsis  Lab Results  Component Value Date   CREATININE 0.70 08/02/2021   CREATININE 0.83 08/01/2021   CREATININE 0.79 07/31/2021   CREATININE 1.36 (H) 07/30/2021   CREATININE 0.97 07/10/2021  -6/7 resolved  Essential HTN - 6/9 increase Amlodipine 5 mg daily  Anemia of chronic disease (baseline HgB 9-12) Lab Results  Component Value Date   HGB 8.9 (L) 08/02/2021   HGB 9.9 (L) 08/01/2021   HGB 10.3 (L) 07/31/2021   HGB 11.7 (L) 07/30/2021   HGB 10.1 (L) 07/10/2021  -Stable    Goals of care - 6/8 PT/OT consult:   Elderly patient with complicated UTI evaluate for CIR vs SNF.  Work with patient daily on strengthening and ROM -6/8 out of bed to chair every shift     Mobility Assessment (last 72 hours)     Mobility Assessment     Row Name 08/02/21 1217 08/02/21 1132 08/02/21 1028 08/01/21 2003 08/01/21 0800   Does patient have an order for bedrest or is patient medically unstable -- -- No - Continue assessment No - Continue assessment No - Continue assessment   What is the highest level of mobility based on the progressive mobility assessment? Level 3 (Stands with assist) - Balance while standing  and cannot march in place Level 4 (Walks with assist in room) - Balance while marching in place and cannot step forward and back - Complete Level 5 (Walks with assist in room/hall) - Balance while stepping forward/back and can walk in room with assist -  Complete Level 3 (Stands with assist) - Balance while standing  and cannot march in place Level 3 (Stands with assist) - Balance while standing  and cannot march in place   Is the above level different from baseline mobility prior to current illness? -- -- Yes - Recommend PT order Yes - Recommend PT order Yes - Recommend PT order    [place] Name 07/31/21 2200           Does patient have an order for bedrest or is  patient medically unstable No - Continue assessment       What is the highest level of mobility based on the progressive mobility assessment? Level 5 (Walks with assist in room/hall) - Balance while stepping forward/back and can walk in room with assist - Complete                  Interdisciplinary Goals of Care Family Meeting   Date carried out: 08/02/2021  Location of the meeting:   Member's involved:   Durable Power of Tour manager:     Discussion: We discussed goals of care for [employer]. .    Code status:   Disposition:   Time spent for the meeting:     Jatara Huettner, Geraldo Docker, MD  08/02/2021, 1:27 PM         DVT prophylaxis: SCD Code Status: DNR Family Communication:  Status is: Inpatient    Dispo: The patient is from: Home              Anticipated d/c is to: Home              Anticipated d/c date is: 3 days              Patient currently is not medically stable to d/c.      Consultants:    Procedures/Significant Events:    I have personally reviewed and interpreted all radiology studies and my findings are as above.  VENTILATOR SETTINGS:    Cultures   Antimicrobials: Anti-infectives (From admission, onward)    Start     Ordered Stop   07/30/21 1730  cefTRIAXone (ROCEPHIN) 2 g in sodium chloride 0.9 % 100 mL IVPB        07/30/21 1719 08/06/21 1729         Devices    LINES / TUBES:      Continuous Infusions:  sodium chloride 100 mL/hr at 08/01/21 2143   meropenem (MERREM) IV 1 g (08/02/21 1042)     Objective: Vitals:   08/01/21 1328 08/01/21 1910 08/02/21 0443 08/02/21 1307  BP: (!) 141/84 (!) 139/91 (!) 159/79 (!) 150/76  Pulse: 80 85 76 87  Resp:  '18 19 18  '$ Temp: 98.8 F (37.1 C) 98.2 F (36.8 C) 98.6 F (37 C) 98.2 F (36.8 C)  TempSrc: Oral Oral Oral Oral  SpO2: 97% 100% 100% 100%  Weight:   61 kg   Height:        Intake/Output Summary (Last 24 hours) at 08/02/2021  1327 Last data filed at 08/02/2021 0900 Gross per 24 hour  Intake 2083.16 ml  Output 2275 ml  Net -191.84 ml    Filed Weights   07/31/21 0714 08/01/21 0500 08/02/21 0443  Weight: 51.3 kg 55 kg 61 kg    Examination:  General: A/O x1 (does not know where, when, why) follows all commands, No acute respiratory distress Eyes: negative scleral hemorrhage, negative anisocoria, negative icterus  ENT: Negative Runny nose, negative gingival bleeding, Neck:  Negative scars, masses, torticollis, lymphadenopathy, JVD Lungs: Clear to auscultation bilaterally without wheezes or crackles Cardiovascular: Regular rate and rhythm without murmur gallop or rub normal S1 and S2 Abdomen: negative abdominal pain, nondistended, positive soft, bowel sounds, no rebound, no ascites, no appreciable mass, suprapubic cath present appears old.  Negative frank pus around suprapubic cath. Extremities: No significant cyanosis, clubbing, or edema bilateral lower extremities Skin: Negative rashes, lesions, ulcers Psychiatric:  Negative depression, negative anxiety, negative fatigue, negative mania  Central nervous system:  Cranial nerves II through XII intact, tongue/uvula midline, all extremities muscle strength 5/5, sensation intact throughout, negative dysarthria, negative expressive aphasia, negative receptive aphasia.  .     Data Reviewed: Care during the described time interval was provided by me .  I have reviewed this patient's available data, including medical history, events of note, physical examination, and all test results as part of my evaluation.  CBC: Recent Labs  Lab 07/30/21 1718 07/31/21 0555 08/01/21 0505 08/02/21 0520  WBC 13.1* 9.3 7.2 5.8  NEUTROABS 9.1* 5.2 4.4 3.4  HGB 11.7* 10.3* 9.9* 8.9*  HCT 37.9* 32.2* 32.0* 28.4*  MCV 86.3 86.3 86.0 85.8  PLT 386 256 263 008    Basic Metabolic Panel: Recent Labs  Lab 07/30/21 1718 07/31/21 0555 08/01/21 0505 08/02/21 0520  NA 136 136 135  141  K 3.2* 4.2 3.6 4.0  CL 101 100 102 111  CO2 '22 26 25 24  '$ GLUCOSE 167* 112* 114* 97  BUN 24* '16 13 13  '$ CREATININE 1.36* 0.79 0.83 0.70  CALCIUM 9.1 8.8* 8.8* 8.5*  MG  --  2.0 2.0 2.0  PHOS  --   --  3.1 3.0    GFR: Estimated Creatinine Clearance: 54 mL/min (by C-G formula based on SCr of 0.7 mg/dL). Liver Function Tests: Recent Labs  Lab 07/30/21 1718 07/31/21 0555 08/01/21 0505 08/02/21 0520  AST 20 13* 13* 13*  ALT '9 8 7 9  '$ ALKPHOS 67 57 57 56  BILITOT 1.0 0.7 0.6 0.5  PROT 7.5 6.2* 5.8* 5.5*  ALBUMIN 3.4* 2.6* 2.4* 2.3*    No results for input(s): "LIPASE", "AMYLASE" in the last 168 hours. No results for input(s): "AMMONIA" in the last 168 hours. Coagulation Profile: Recent Labs  Lab 07/30/21 1718  INR 1.2    Cardiac Enzymes: No results for input(s): "CKTOTAL", "CKMB", "CKMBINDEX", "TROPONINI" in the last 168 hours. BNP (last 3 results) No results for input(s): "PROBNP" in the last 8760 hours. HbA1C: No results for input(s): "HGBA1C" in the last 72 hours. CBG: No results for input(s): "GLUCAP" in the last 168 hours. Lipid Profile: No results for input(s): "CHOL", "HDL", "LDLCALC", "TRIG", "CHOLHDL", "LDLDIRECT" in the last 72 hours. Thyroid Function Tests: No results for input(s): "TSH", "T4TOTAL", "FREET4", "T3FREE", "THYROIDAB" in the last 72 hours. Anemia Panel: No results for input(s): "VITAMINB12", "FOLATE", "FERRITIN", "TIBC", "IRON", "RETICCTPCT" in the last 72 hours. Sepsis Labs: Recent Labs  Lab 07/30/21 1718 07/30/21 1918 07/31/21 0555  LATICACIDVEN 5.1* 2.5* 1.4     Recent Results (from the past 240 hour(s))  Culture, blood (x 2)     Status: None (Preliminary result)   Collection Time: 07/30/21  4:45 PM   Specimen: BLOOD  Result Value Ref Range Status   Specimen Description   Final    BLOOD RIGHT ANTECUBITAL Performed at [facility], [place] [address] [place], [place] [address]    Special Requests  Final     BOTTLES DRAWN AEROBIC AND ANAEROBIC Blood Culture results may not be optimal due to an excessive volume of blood received in culture bottles Performed at [place] [address] [place], [place] [address]    Culture   Final    NO GROWTH 2 DAYS Performed at [place] [address] [place], [place] [address]    Report Status PENDING  Incomplete  Culture, blood (x 2)     Status: None (Preliminary result)   Collection Time: 07/30/21  5:00 PM   Specimen: BLOOD RIGHT ARM  Result Value Ref Range Status   Specimen Description   Final    BLOOD RIGHT ARM Performed at [place] [address], [place], [place] [address]    Special Requests   Final    BOTTLES DRAWN AEROBIC AND ANAEROBIC Blood Culture results may not be optimal due to an excessive volume of blood received in culture bottles Performed at [place] [address], [place], [place] [address]    Culture   Final    NO GROWTH 2 DAYS Performed at [place] [address] [place], [place] [address]    Report Status PENDING  Incomplete  Urine Culture     Status: Abnormal   Collection Time: 07/30/21  6:58 PM   Specimen: Urine, Catheterized  Result Value Ref Range Status   Specimen Description   Final    URINE, CATHETERIZED Performed at [place] [address] [place], [place]  [address]    Special Requests   Final    NONE Performed at [facility], [place] [address] [place], [place] [address]    Culture (A)  Final    >=100,000 COLONIES/mL KLEBSIELLA PNEUMONIAE Confirmed Extended Spectrum Beta-Lactamase Producer (ESBL).  In bloodstream infections from ESBL organisms, carbapenems are preferred over piperacillin/tazobactam. They are shown to have a lower risk of mortality.    Report Status 08/01/2021 FINAL  Final   Organism ID, Bacteria KLEBSIELLA PNEUMONIAE (A)  Final      Susceptibility    Klebsiella pneumoniae - MIC*    AMPICILLIN >=32 RESISTANT Resistant     CEFAZOLIN >=64 RESISTANT Resistant     CEFEPIME 0.5 SENSITIVE Sensitive     CEFTRIAXONE 8 RESISTANT Resistant     CIPROFLOXACIN 1 RESISTANT Resistant     GENTAMICIN >=16 RESISTANT Resistant     IMIPENEM <=0.25 SENSITIVE Sensitive     NITROFURANTOIN 64 INTERMEDIATE Intermediate     TRIMETH/SULFA >=320 RESISTANT Resistant     AMPICILLIN/SULBACTAM 8 SENSITIVE Sensitive     PIP/TAZO <=4 SENSITIVE Sensitive     * >=100,000 COLONIES/mL KLEBSIELLA PNEUMONIAE  Aerobic Culture w Gram Stain (superficial specimen)     Status: None (Preliminary result)   Collection Time: 07/30/21  7:56 PM   Specimen: Skin, Cyst/Tag/Debridement  Result Value Ref Range Status   Specimen Description   Final    SKIN ABDOMINAL WALL SUPRAPUBIC CATHETER INSERTION SITE Performed at [place] [address] [place], [place] [address]    Special Requests   Final    NONE Performed at [facility], [place] [address] [place], [place] [address]    Gram Stain   Final    NO SQUAMOUS EPITHELIAL CELLS SEEN FEW GRAM POSITIVE RODS FEW GRAM NEGATIVE RODS FEW GRAM POSITIVE COCCI NO WBC SEEN    Culture   Final    FEW KLEBSIELLA PNEUMONIAE SUSCEPTIBILITIES  TO FOLLOW Performed at [facility], [place] [address], [place], [place] [address]    Report Status PENDING  Incomplete         Radiology Studies: No results found.      Scheduled Meds:  acetaminophen  1,000 mg Oral TID   amLODipine  2.5 mg Oral QHS   aspirin EC  81 mg Oral QHS   feeding supplement  237 mL Oral BID BM   memantine  10 mg Oral BID   Continuous Infusions:  sodium chloride 100 mL/hr at 08/01/21 2143   meropenem (MERREM) IV 1 g (08/02/21 1042)     LOS: 3 days    Time spent:40 min    Takera Rayl, Geraldo Docker, MD [facility]   If 7PM-7AM, please contact night-coverage 08/02/2021, 1:27 PM

## 2021-08-02 NOTE — Evaluation (Signed)
Occupational Therapy Evaluation Patient Details Name: Andre Casey. MRN: [id number] DOB: 10-21-1929 Today's Date: 08/02/2021   History of Present Illness Andre Casey. is a 88 y.o. male admitted with sepsis due to UTI.  Medical history significant of metastatic prostate cancer, right renal mass/pancreatic mass, physical deconditioning, urinary incontinence with indwelling suprapubic catheter, penile prosthesis implant with recent removal due to urinary retention on 05/16/21) CKD, Alzheimers, HTN, recent Covid 19 infection (04/15/21), normocytic anemia and CAD   Clinical Impression   Patient is currently requiring assistance with ADLs including moderate assist with seated Lower body ADLs and total assist with standing ADLs, setup assist with seated Upper body ADLs,  as well as  Minimal assist with bed mobility and moderate to max assist with functional transfers to toilet.  Pt also incontinent of bowel and unaware of this, require total assist for hygiene. Current level of function is below patient's typical baseline.  During this evaluation, patient was limited by generalized weakness, impaired activity tolerance, mild cognitive deficits with disorientation to month, and pain to LT knee, RT knee and RT wrist, all of which has the potential to impact patient's safety and independence during functional mobility, as well as performance for ADLs.  Patient lives at home alone, and has a granddaughter who is unable to provide daily supervision and assistance, but does provide meals and daily phone or in-person checks.  Patient demonstrates good rehab potential, and should benefit from continued skilled occupational therapy services while in acute care to maximize safety, independence and quality of life at home.  Continued occupational therapy services in a SNF setting prior to return home is recommended.  ?      Recommendations for follow up therapy are one component of a  multi-disciplinary discharge planning process, led by the attending physician.  Recommendations may be updated based on patient status, additional functional criteria and insurance authorization.   Follow Up Recommendations  Skilled nursing-short term rehab (<3 hours/day)    Assistance Recommended at Discharge Frequent or constant Supervision/Assistance  Patient can return home with the following Assistance with cooking/housework;Assist for transportation;Help with stairs or ramp for entrance;A lot of help with walking and/or transfers;A lot of help with bathing/dressing/bathroom    Functional Status Assessment  Patient has had a recent decline in their functional status and demonstrates the ability to make significant improvements in function in a reasonable and predictable amount of time.  Equipment Recommendations  Other (comment) (TBD)    Recommendations for Other Services       Precautions / Restrictions Precautions Precautions: Fall Precaution Comments: Painful knees Required Braces or Orthoses: Splint/Cast Splint/Cast: Rt forearm brace Restrictions Weight Bearing Restrictions: No      Mobility Bed Mobility Overal bed mobility: Needs Assistance Bed Mobility: Supine to Sit     Supine to sit: HOB elevated, Min assist     General bed mobility comments: Increased time and effort. Pt able to control trunk and advance LLE off bed. Pt required assist to scoot RT hip forward to complete transition due to pt unable to puish with RT UE due to wrist pain.    Transfers                          Balance Overall balance assessment: Needs assistance Sitting-balance support: No upper extremity supported Sitting balance-Leahy Scale: Good     Standing balance support: During functional activity, Reliant on assistive device for balance Standing balance-Leahy Scale: Poor  ADL either performed or assessed with clinical judgement    ADL Overall ADL's : Needs assistance/impaired Eating/Feeding: Set up   Grooming: Oral care;Supervision/safety;Wash/dry hands;Sitting;Set up Grooming Details (indicate cue type and reason): Could not tolerate standing at sink after standing for peri care. Asked to sit due to knee pain. Upper Body Bathing: Set up;Sitting   Lower Body Bathing: Moderate assistance;Sitting/lateral leans   Upper Body Dressing : Set up;Sitting   Lower Body Dressing: Moderate assistance;Total assistance;Sitting/lateral leans;Sit to/from stand Lower Body Dressing Details (indicate cue type and reason): Total Assist standing as pt unable to release RW. Toilet Transfer: Rolling walker (2 wheels);Moderate assistance Toilet Transfer Details (indicate cue type and reason): Pt stood from EOB with Moderate assist and used RW to pivot to chair with Mod-Max As and assist needed to negotiate walker and keep close enough to pt to support knees. Pt very stooped and unsteady during transfer. Toileting- Clothing Manipulation and Hygiene: Total assistance;Sitting/lateral lean Toileting - Clothing Manipulation Details (indicate cue type and reason): Pt found to be soiled once standing, and unaware of bowel movement. Pt able to stand and grasp RW with BUEs to receive total assist peri care.     Functional mobility during ADLs: Moderate assistance;Rolling walker (2 wheels);Maximal assistance       Vision Baseline Vision/History: 1 Wears glasses (readers) Patient Visual Report: No change from baseline Vision Assessment?: No apparent visual deficits     Perception     Praxis      Pertinent Vitals/Pain Pain Assessment Pain Assessment: 0-10 Pain Score: 5  Pain Location: L knee: 5/10; RT knee 3/10; wrist 5/10 Pain Descriptors / Indicators: Discomfort, Grimacing, Guarding Pain Intervention(s): Limited activity within patient's tolerance, Monitored during session, Repositioned     Hand Dominance Right   Extremity/Trunk  Assessment Upper Extremity Assessment Upper Extremity Assessment: Generalized weakness RUE Deficits / Details: WFL ROM, reports right wrist pain, has wrist splint LUE Deficits / Details: WFL ROM and strength grossly 4-/5   Lower Extremity Assessment Lower Extremity Assessment: Defer to PT evaluation   Cervical / Trunk Assessment Cervical / Trunk Assessment: Kyphotic   Communication Communication Communication: HOH   Cognition Arousal/Alertness: Awake/alert Behavior During Therapy: WFL for tasks assessed/performed Overall Cognitive Status: Within Functional Limits for tasks assessed                                 General Comments: alert and pleasant. Has memory deficits. Oriented to all but month stating "March".     General Comments       Exercises     Shoulder Instructions      Home Living Family/patient expects to be discharged to:: Private residence Living Arrangements: Alone Available Help at Discharge: Available PRN/intermittently;Family (grandauhter) Type of Home: House Home Access: Ramped entrance Entrance Stairs-Number of Steps: 4 Entrance Stairs-Rails: [place]: One level     Bathroom Shower/Tub: Occupational psychologist: Standard Bathroom Accessibility: Yes How Accessible: Accessible via walker Home Equipment: [place] (2 wheels);Cane - single point;Wheelchair - manual;BSC/3in1;Grab bars - tub/shower;Grab bars - toilet;Shower seat - built in   Additional Comments: has a granddaughter-in-law that checks on him at least almost daily either in person or by phone, brings meals for couple days. Eats frozen meals when meals not provided for him. Has neighbor 1 block away that he reports he can call on in emergency, they talk over the phone almost every day.  Prior Functioning/Environment Prior Level of Function : Needs assist       Physical Assist : ADLs (physical)   ADLs (physical): IADLs Mobility Comments:  Denies fall in past 6 mo. Typically ambulated without AD ADLs Comments: Reprots granddaughter assists with meal prep and other IADLs as needed. Pt reports that he does drive but family has been asking him to stop.        OT Problem List: Decreased strength;Decreased range of motion;Decreased activity tolerance;Impaired balance (sitting and/or standing);Decreased knowledge of precautions;Impaired UE functional use;Pain;Decreased knowledge of use of DME or AE      OT Treatment/Interventions: Self-care/ADL training;Therapeutic exercise;DME and/or AE instruction;Therapeutic activities;Balance training;Patient/family education    OT Goals(Current goals can be found in the care plan section) Acute Rehab OT Goals Patient Stated Goal: Pt requesting return to rehab facility OT Goal Formulation: With patient Time For Goal Achievement: 08/16/21 Potential to Achieve Goals: Good ADL Goals Pt Will Perform Grooming: standing;with modified independence (Tolerating at least 1 standing grooming task) Pt Will Perform Lower Body Bathing: with modified independence;sitting/lateral leans Pt Will Perform Lower Body Dressing: with modified independence;with adaptive equipment;sitting/lateral leans;sit to/from stand Pt Will Transfer to Toilet: with modified independence;ambulating Pt Will Perform Toileting - Clothing Manipulation and hygiene: with modified independence;sitting/lateral leans;sit to/from stand Pt/caregiver will Perform Home Exercise Program: Both right and left upper extremity;Increased strength;With Supervision  OT Frequency: Min 2X/week    Co-evaluation              AM-PAC OT "6 Clicks" Daily Activity     Outcome Measure Help from another person eating meals?: None Help from another person taking care of personal grooming?: A Little Help from another person toileting, which includes using toliet, bedpan, or urinal?: Total Help from another person bathing (including washing, rinsing,  drying)?: A Lot Help from another person to put on and taking off regular upper body clothing?: A Little Help from another person to put on and taking off regular lower body clothing?: A Lot 6 Click Score: 15   End of Session Equipment Utilized During Treatment: Rolling walker (2 wheels);Gait belt Nurse Communication: Mobility status  Activity Tolerance: Patient limited by pain Patient left: in chair;with call bell/phone within reach;with chair alarm set  OT Visit Diagnosis: Pain;Muscle weakness (generalized) (M62.81);Unsteadiness on feet (R26.81) Pain - Right/Left: Left Pain - part of body: Knee (and RT knee and RT wrist)                Time: 5035-4656 OT Time Calculation (min): 37 min Charges:  OT General Charges $OT Visit: 1 Visit OT Evaluation $OT Eval Moderate Complexity: 1 Mod OT Treatments $Self Care/Home Management : 8-22 mins  Anderson Malta, OT Acute Rehab Services Office: [phone] 08/02/2021  Julien Girt 08/02/2021, 12:20 PM

## 2021-08-03 DIAGNOSIS — I10 Essential (primary) hypertension: Secondary | ICD-10-CM | POA: Diagnosis not present

## 2021-08-03 DIAGNOSIS — D638 Anemia in other chronic diseases classified elsewhere: Secondary | ICD-10-CM | POA: Diagnosis not present

## 2021-08-03 DIAGNOSIS — N179 Acute kidney failure, unspecified: Secondary | ICD-10-CM | POA: Diagnosis not present

## 2021-08-03 DIAGNOSIS — A419 Sepsis, unspecified organism: Secondary | ICD-10-CM | POA: Diagnosis not present

## 2021-08-03 LAB — CBC WITH DIFFERENTIAL/PLATELET
Abs Immature Granulocytes: 0.05 10*3/uL (ref 0.00–0.07)
Basophils Absolute: 0 10*3/uL (ref 0.0–0.1)
Basophils Relative: 1 %
Eosinophils Absolute: 0.4 10*3/uL (ref 0.0–0.5)
Eosinophils Relative: 8 %
HCT: 32 % — ABNORMAL LOW (ref 39.0–52.0)
Hemoglobin: 9.8 g/dL — ABNORMAL LOW (ref 13.0–17.0)
Immature Granulocytes: 1 %
Lymphocytes Relative: 31 %
Lymphs Abs: 1.6 10*3/uL (ref 0.7–4.0)
MCH: 26.2 pg (ref 26.0–34.0)
MCHC: 30.6 g/dL (ref 30.0–36.0)
MCV: 85.6 fL (ref 80.0–100.0)
Monocytes Absolute: 0.5 10*3/uL (ref 0.1–1.0)
Monocytes Relative: 10 %
Neutro Abs: 2.5 10*3/uL (ref 1.7–7.7)
Neutrophils Relative %: 49 %
Platelets: 379 10*3/uL (ref 150–400)
RBC: 3.74 MIL/uL — ABNORMAL LOW (ref 4.22–5.81)
RDW: 14.5 % (ref 11.5–15.5)
WBC: 5.1 10*3/uL (ref 4.0–10.5)
nRBC: 0 % (ref 0.0–0.2)

## 2021-08-03 LAB — COMPREHENSIVE METABOLIC PANEL
ALT: 9 U/L (ref 0–44)
AST: 15 U/L (ref 15–41)
Albumin: 2.4 g/dL — ABNORMAL LOW (ref 3.5–5.0)
Alkaline Phosphatase: 56 U/L (ref 38–126)
Anion gap: 6 (ref 5–15)
BUN: 14 mg/dL (ref 8–23)
CO2: 24 mmol/L (ref 22–32)
Calcium: 8.8 mg/dL — ABNORMAL LOW (ref 8.9–10.3)
Chloride: 113 mmol/L — ABNORMAL HIGH (ref 98–111)
Creatinine, Ser: 0.75 mg/dL (ref 0.61–1.24)
GFR, Estimated: >60 mL/min (ref >60)
Glucose, Bld: 103 mg/dL — ABNORMAL HIGH (ref 70–99)
Potassium: 3.6 mmol/L (ref 3.5–5.1)
Sodium: 143 mmol/L (ref 135–145)
Total Bilirubin: 0.4 mg/dL (ref 0.3–1.2)
Total Protein: 5.8 g/dL — ABNORMAL LOW (ref 6.5–8.1)

## 2021-08-03 LAB — PHOSPHORUS: Phosphorus: 2.6 mg/dL (ref 2.5–4.6)

## 2021-08-03 LAB — MAGNESIUM: Magnesium: 2.1 mg/dL (ref 1.7–2.4)

## 2021-08-03 NOTE — Progress Notes (Signed)
PROGRESS NOTE    [name].  IEP:[id number] DOB: 01-02-1930 DOA: 07/30/2021 PCP: [name], MD     Brief Narrative:   88 y.o. WM PMHx prostate cancer status post radiation and Lupron injections, essential HTN, CAD s/p PCI with stent placement x2 in November 2017, anemia of chronic disease associated baseline globin 9-12, Renal mass,   Admitted to [facility] on 07/30/2021 with severe sepsis due to urinary tract infection after presenting from home to [facility] ED complaining of fever.    The patient, lives at home, reports 1 day of objective fever, noting temperature max of 103 at home over that timeframe.  He notes associated chills in the absence of full body rigors or generalized myalgias.   Denies any recent headache, neck stiffness, rhinitis, rhinorrhea, sore throat, sob, wheezing, cough, nausea, vomiting, abdominal pain, diarrhea, or rash. No recent traveling or known COVID-19 exposures.  In the context of a history of prostate cancer he underwent suprapubic catheter placement on 05/16/2021.  Patient remains worsening cloudy appearance of associated urine over the last few days.   Denies any recent chest pain, diaphoresis, or palpitations.  No recent lower extremity erythema, calf tenderness, or worsening peripheral edema.   Of note, the patient reportedly recently had an superficial abscess surrounding his suprapubic catheter, for which she underwent IR aspiration on 07/05/2021.   Subjective: 6/10 A/O x4.  States lives alone.  States daughter lives in [place], granddaughter lives near him.  Assessment & Plan: Covid vaccination;   Principal Problem:   Severe sepsis ([place]) Active Problems:   Essential hypertension   Hypokalemia   Acute lower UTI   AKI (acute kidney injury) ([place])   Anemia of chronic disease  Severe sepsis/Complicated UTI -Continue current antibiotic x14 days - 6/7 patient unsure of last time suprapubic catheter changed.  [employer] EMR,  appears quite old. -LR 159m/hr -6/8 DC LR,---> normal saline 1025mhr  Hypokalemia - Potassium goal> 4 - 6/8 K-Dur 50 mEq  AKI (baseline Cr 0.97 on 5/17) -See sepsis  Lab Results  Component Value Date   CREATININE 0.75 08/03/2021   CREATININE 0.70 08/02/2021   CREATININE 0.83 08/01/2021   CREATININE 0.79 07/31/2021   CREATININE 1.36 (H) 07/30/2021  -6/7 resolved  Essential HTN - 6/9 increase Amlodipine 5 mg daily  Anemia of chronic disease (baseline HgB 9-12) Lab Results  Component Value Date   HGB 9.8 (L) 08/03/2021   HGB 8.9 (L) 08/02/2021   HGB 9.9 (L) 08/01/2021   HGB 10.3 (L) 07/31/2021   HGB 11.7 (L) 07/30/2021  -Stable    Goals of care - 6/8 PT/OT consult:   Elderly patient with complicated UTI evaluate for CIR vs SNF.  Work with patient daily on strengthening and ROM: Recommend SNF -6/8 out of bed to chair every shift -6/10 TOC consult: SNF     Mobility Assessment (last 72 hours)     Mobility Assessment     Row Name 08/03/21 0849 08/02/21 2238 08/02/21 1217 08/02/21 1132 08/02/21 1028   Does patient have an order for bedrest or is patient medically unstable No - Continue assessment No - Continue assessment -- -- No - Continue assessment   What is the highest level of mobility based on the progressive mobility assessment? Level 5 (Walks with assist in room/hall) - Balance while stepping forward/back and can walk in room with assist - Complete Level 3 (Stands with assist) - Balance while standing  and cannot march in place Level 3 (Stands with  assist) - Balance while standing  and cannot march in place Level 4 (Walks with assist in room) - Balance while marching in place and cannot step forward and back - Complete Level 5 (Walks with assist in room/hall) - Balance while stepping forward/back and can walk in room with assist - Complete   Is the above level different from baseline mobility prior to current illness? Yes - Recommend PT order Yes - Recommend PT order  -- -- Yes - Recommend PT order    [place] Name 08/01/21 2003 08/01/21 0800 07/31/21 2200       Does patient have an order for bedrest or is patient medically unstable No - Continue assessment No - Continue assessment No - Continue assessment     What is the highest level of mobility based on the progressive mobility assessment? Level 3 (Stands with assist) - Balance while standing  and cannot march in place Level 3 (Stands with assist) - Balance while standing  and cannot march in place Level 5 (Walks with assist in room/hall) - Balance while stepping forward/back and can walk in room with assist - Complete     Is the above level different from baseline mobility prior to current illness? Yes - Recommend PT order Yes - Recommend PT order --                Interdisciplinary Goals of Care Family Meeting   Date carried out: 08/03/2021  Location of the meeting:   Member's involved:   Durable Power of Tour manager:     Discussion: We discussed goals of care for [employer]. .    Code status:   Disposition:   Time spent for the meeting:     Allie Bossier, MD  08/03/2021, 3:43 PM         DVT prophylaxis: SCD Code Status: DNR Family Communication:  Status is: Inpatient    Dispo: The patient is from: Home              Anticipated d/c is to: Home              Anticipated d/c date is: 3 days              Patient currently is not medically stable to d/c.      Consultants:    Procedures/Significant Events:    I have personally reviewed and interpreted all radiology studies and my findings are as above.  VENTILATOR SETTINGS:    Cultures   Antimicrobials: Anti-infectives (From admission, onward)    Start     Ordered Stop   07/30/21 1730  cefTRIAXone (ROCEPHIN) 2 g in sodium chloride 0.9 % 100 mL IVPB        07/30/21 1719 08/06/21 1729         Devices    LINES / TUBES:      Continuous Infusions:   sodium chloride 100 mL/hr at 08/02/21 1753   meropenem (MERREM) IV 1 g (08/03/21 1207)     Objective: Vitals:   08/02/21 0443 08/02/21 1307 08/02/21 2048 08/03/21 0556  BP: (!) 159/79 (!) 150/76 (!) 148/80 (!) 161/80  Pulse: 76 87 96 72  Resp: '19 18 19 19  '$ Temp: 98.6 F (37 C) 98.2 F (36.8 C) 98.1 F (36.7 C) 97.9 F (36.6 C)  TempSrc: Oral Oral Oral Oral  SpO2: 100% 100% 98% 100%  Weight: 61 kg   62.2 kg  Height:  Intake/Output Summary (Last 24 hours) at 08/03/2021 1543 Last data filed at 08/03/2021 1500 Gross per 24 hour  Intake 2702.23 ml  Output 5200 ml  Net -2497.77 ml    Filed Weights   08/01/21 0500 08/02/21 0443 08/03/21 0556  Weight: 55 kg 61 kg 62.2 kg    Examination:  General: A/O x1 (does not know where, when, why) follows all commands, No acute respiratory distress Eyes: negative scleral hemorrhage, negative anisocoria, negative icterus ENT: Negative Runny nose, negative gingival bleeding, Neck:  Negative scars, masses, torticollis, lymphadenopathy, JVD Lungs: Clear to auscultation bilaterally without wheezes or crackles Cardiovascular: Regular rate and rhythm without murmur gallop or rub normal S1 and S2 Abdomen: negative abdominal pain, nondistended, positive soft, bowel sounds, no rebound, no ascites, no appreciable mass, suprapubic cath present appears old.  Negative frank pus around suprapubic cath. Extremities: No significant cyanosis, clubbing, or edema bilateral lower extremities Skin: Negative rashes, lesions, ulcers Psychiatric:  Negative depression, negative anxiety, negative fatigue, negative mania  Central nervous system:  Cranial nerves II through XII intact, tongue/uvula midline, all extremities muscle strength 5/5, sensation intact throughout, negative dysarthria, negative expressive aphasia, negative receptive aphasia.  .     Data Reviewed: Care during the described time interval was provided by me .  I have reviewed this  patient's available data, including medical history, events of note, physical examination, and all test results as part of my evaluation.  CBC: Recent Labs  Lab 07/30/21 1718 07/31/21 0555 08/01/21 0505 08/02/21 0520 08/03/21 0540  WBC 13.1* 9.3 7.2 5.8 5.1  NEUTROABS 9.1* 5.2 4.4 3.4 2.5  HGB 11.7* 10.3* 9.9* 8.9* 9.8*  HCT 37.9* 32.2* 32.0* 28.4* 32.0*  MCV 86.3 86.3 86.0 85.8 85.6  PLT 386 256 263 348 825    Basic Metabolic Panel: Recent Labs  Lab 07/30/21 1718 07/31/21 0555 08/01/21 0505 08/02/21 0520 08/03/21 0540  NA 136 136 135 141 143  K 3.2* 4.2 3.6 4.0 3.6  CL 101 100 102 111 113*  CO2 '22 26 25 24 24  '$ GLUCOSE 167* 112* 114* 97 103*  BUN 24* '16 13 13 14  '$ CREATININE 1.36* 0.79 0.83 0.70 0.75  CALCIUM 9.1 8.8* 8.8* 8.5* 8.8*  MG  --  2.0 2.0 2.0 2.1  PHOS  --   --  3.1 3.0 2.6    GFR: Estimated Creatinine Clearance: 55.1 mL/min (by C-G formula based on SCr of 0.75 mg/dL). Liver Function Tests: Recent Labs  Lab 07/30/21 1718 07/31/21 0555 08/01/21 0505 08/02/21 0520 08/03/21 0540  AST 20 13* 13* 13* 15  ALT '9 8 7 9 9  '$ ALKPHOS 67 57 57 56 56  BILITOT 1.0 0.7 0.6 0.5 0.4  PROT 7.5 6.2* 5.8* 5.5* 5.8*  ALBUMIN 3.4* 2.6* 2.4* 2.3* 2.4*    No results for input(s): "LIPASE", "AMYLASE" in the last 168 hours. No results for input(s): "AMMONIA" in the last 168 hours. Coagulation Profile: Recent Labs  Lab 07/30/21 1718  INR 1.2    Cardiac Enzymes: No results for input(s): "CKTOTAL", "CKMB", "CKMBINDEX", "TROPONINI" in the last 168 hours. BNP (last 3 results) No results for input(s): "PROBNP" in the last 8760 hours. HbA1C: No results for input(s): "HGBA1C" in the last 72 hours. CBG: No results for input(s): "GLUCAP" in the last 168 hours. Lipid Profile: No results for input(s): "CHOL", "HDL", "LDLCALC", "TRIG", "CHOLHDL", "LDLDIRECT" in the last 72 hours. Thyroid Function Tests: No results for input(s): "TSH", "T4TOTAL", "FREET4", "T3FREE",  "THYROIDAB" in the last 72 hours.  Anemia Panel: No results for input(s): "VITAMINB12", "FOLATE", "FERRITIN", "TIBC", "IRON", "RETICCTPCT" in the last 72 hours. Sepsis Labs: Recent Labs  Lab 07/30/21 1718 07/30/21 1918 07/31/21 0555  LATICACIDVEN 5.1* 2.5* 1.4     Recent Results (from the past 240 hour(s))  Culture, blood (x 2)     Status: None (Preliminary result)   Collection Time: 07/30/21  4:45 PM   Specimen: BLOOD  Result Value Ref Range Status   Specimen Description   Final    BLOOD RIGHT ANTECUBITAL Performed at [facility], [place] [address], [place], [place] [address]    Special Requests   Final    BOTTLES DRAWN AEROBIC AND ANAEROBIC Blood Culture results may not be optimal due to an excessive volume of blood received in culture bottles Performed at [facility] [address] [place], [place] [address]    Culture   Final    NO GROWTH 4 DAYS Performed at [facility], [place] [address] [place], [place] [address]    Report Status PENDING  Incomplete  Culture, blood (x 2)     Status: None (Preliminary result)   Collection Time: 07/30/21  5:00 PM   Specimen: BLOOD RIGHT ARM  Result Value Ref Range Status   Specimen Description   Final    BLOOD RIGHT ARM Performed at [facility] [address] [place], [place] [address]    Special Requests   Final    BOTTLES DRAWN AEROBIC AND ANAEROBIC Blood Culture results may not be optimal due to an excessive volume of blood received in culture bottles Performed at [facility] [address], [place], [place] [address]    Culture   Final    NO GROWTH 4 DAYS Performed at [facility], [place] [address], [place], [place] [address]    Report Status PENDING  Incomplete  Urine Culture     Status: Abnormal   Collection Time: 07/30/21  6:58 PM   Specimen: Urine, Catheterized  Result Value Ref Range Status   Specimen Description   Final     URINE, CATHETERIZED Performed at [facility] [address], [place], [place] [address]    Special Requests   Final    NONE Performed at [facility], [place] [address], [place],  [address]    Culture (A)  Final    >=100,000 COLONIES/mL KLEBSIELLA PNEUMONIAE Confirmed Extended Spectrum Beta-Lactamase Producer (ESBL).  In bloodstream infections from ESBL organisms, carbapenems are preferred over piperacillin/tazobactam. They are shown to have a lower risk of mortality.    Report Status 08/01/2021 FINAL  Final   Organism ID, Bacteria KLEBSIELLA PNEUMONIAE (A)  Final      Susceptibility   Klebsiella pneumoniae - MIC*    AMPICILLIN >=32 RESISTANT Resistant     CEFAZOLIN >=64 RESISTANT Resistant     CEFEPIME 0.5 SENSITIVE Sensitive     CEFTRIAXONE 8 RESISTANT Resistant     CIPROFLOXACIN 1 RESISTANT Resistant     GENTAMICIN >=16 RESISTANT Resistant     IMIPENEM <=0.25 SENSITIVE Sensitive     NITROFURANTOIN 64 INTERMEDIATE Intermediate     TRIMETH/SULFA >=320 RESISTANT Resistant     AMPICILLIN/SULBACTAM 8 SENSITIVE Sensitive     PIP/TAZO <=4 SENSITIVE Sensitive     * >=100,000 COLONIES/mL KLEBSIELLA PNEUMONIAE  Aerobic Culture w Gram Stain (superficial specimen)     Status: None (Preliminary result)   Collection Time: 07/30/21  7:56 PM   Specimen:  Skin, Cyst/Tag/Debridement  Result Value Ref Range Status   Specimen Description   Final    SKIN ABDOMINAL WALL SUPRAPUBIC CATHETER INSERTION SITE Performed at [facility] [address] [place], [place] [address]    Special Requests   Final    NONE Performed at [facility], [place] [address] [place], [place] [address]    Gram Stain   Final    NO SQUAMOUS EPITHELIAL CELLS SEEN FEW GRAM POSITIVE RODS FEW GRAM NEGATIVE RODS FEW GRAM POSITIVE COCCI NO WBC SEEN    Culture   Final    FEW KLEBSIELLA PNEUMONIAE FEW STAPHYLOCOCCUS AUREUS SUSCEPTIBILITIES TO  FOLLOW Performed at [facility], [place] [address] [place],  [address]    Report Status PENDING  Incomplete         Radiology Studies: No results found.      Scheduled Meds:  acetaminophen  1,000 mg Oral TID   amLODipine  5 mg Oral QHS   aspirin EC  81 mg Oral QHS   feeding supplement  237 mL Oral BID BM   memantine  10 mg Oral BID   Continuous Infusions:  sodium chloride 100 mL/hr at 08/02/21 1753   meropenem (MERREM) IV 1 g (08/03/21 1207)     LOS: 4 days    Time spent:40 min    Katrese Shell, Geraldo Docker, MD [facility]   If 7PM-7AM, please contact night-coverage 08/03/2021, 3:43 PM

## 2021-08-04 DIAGNOSIS — I10 Essential (primary) hypertension: Secondary | ICD-10-CM | POA: Diagnosis not present

## 2021-08-04 DIAGNOSIS — A419 Sepsis, unspecified organism: Secondary | ICD-10-CM | POA: Diagnosis not present

## 2021-08-04 DIAGNOSIS — N179 Acute kidney failure, unspecified: Secondary | ICD-10-CM | POA: Diagnosis not present

## 2021-08-04 DIAGNOSIS — D638 Anemia in other chronic diseases classified elsewhere: Secondary | ICD-10-CM | POA: Diagnosis not present

## 2021-08-04 LAB — CULTURE, BLOOD (ROUTINE X 2)
Culture: NO GROWTH
Culture: NO GROWTH

## 2021-08-04 LAB — CBC WITH DIFFERENTIAL/PLATELET
Abs Immature Granulocytes: 0.05 10*3/uL (ref 0.00–0.07)
Basophils Absolute: 0.1 10*3/uL (ref 0.0–0.1)
Basophils Relative: 1 %
Eosinophils Absolute: 0.6 10*3/uL — ABNORMAL HIGH (ref 0.0–0.5)
Eosinophils Relative: 11 %
HCT: 31.1 % — ABNORMAL LOW (ref 39.0–52.0)
Hemoglobin: 9.6 g/dL — ABNORMAL LOW (ref 13.0–17.0)
Immature Granulocytes: 1 %
Lymphocytes Relative: 27 %
Lymphs Abs: 1.4 10*3/uL (ref 0.7–4.0)
MCH: 26.8 pg (ref 26.0–34.0)
MCHC: 30.9 g/dL (ref 30.0–36.0)
MCV: 86.9 fL (ref 80.0–100.0)
Monocytes Absolute: 0.6 10*3/uL (ref 0.1–1.0)
Monocytes Relative: 11 %
Neutro Abs: 2.6 10*3/uL (ref 1.7–7.7)
Neutrophils Relative %: 49 %
Platelets: 416 10*3/uL — ABNORMAL HIGH (ref 150–400)
RBC: 3.58 MIL/uL — ABNORMAL LOW (ref 4.22–5.81)
RDW: 14.4 % (ref 11.5–15.5)
WBC: 5.3 10*3/uL (ref 4.0–10.5)
nRBC: 0 % (ref 0.0–0.2)

## 2021-08-04 LAB — COMPREHENSIVE METABOLIC PANEL
ALT: 11 U/L (ref 0–44)
AST: 17 U/L (ref 15–41)
Albumin: 2.3 g/dL — ABNORMAL LOW (ref 3.5–5.0)
Alkaline Phosphatase: 49 U/L (ref 38–126)
Anion gap: 7 (ref 5–15)
BUN: 14 mg/dL (ref 8–23)
CO2: 24 mmol/L (ref 22–32)
Calcium: 8.6 mg/dL — ABNORMAL LOW (ref 8.9–10.3)
Chloride: 110 mmol/L (ref 98–111)
Creatinine, Ser: 0.64 mg/dL (ref 0.61–1.24)
GFR, Estimated: >60 mL/min (ref >60)
Glucose, Bld: 101 mg/dL — ABNORMAL HIGH (ref 70–99)
Potassium: 3.4 mmol/L — ABNORMAL LOW (ref 3.5–5.1)
Sodium: 141 mmol/L (ref 135–145)
Total Bilirubin: 0.3 mg/dL (ref 0.3–1.2)
Total Protein: 5.8 g/dL — ABNORMAL LOW (ref 6.5–8.1)

## 2021-08-04 LAB — PHOSPHORUS: Phosphorus: 2.4 mg/dL — ABNORMAL LOW (ref 2.5–4.6)

## 2021-08-04 LAB — MAGNESIUM: Magnesium: 2 mg/dL (ref 1.7–2.4)

## 2021-08-04 MED ORDER — SODIUM CHLORIDE 0.9% FLUSH: 10.0000 mL | INTRAVENOUS | Status: DC | PRN

## 2021-08-04 MED ORDER — AMLODIPINE BESYLATE 10 MG PO TABS
10.0000 mg | ORAL_TABLET | Freq: Every day | ORAL | Status: DC
  Administered 2021-08-04 – 2021-08-07 (×4): 10 mg via ORAL
  Filled 2021-08-04 (×4): qty 1

## 2021-08-04 NOTE — NC FL2 (Signed)
[place] LEVEL OF CARE SCREENING TOOL     IDENTIFICATION  Patient Name: Andre Casey. Birthdate: [birth year] Sex: male Admission Date (Current Location): 07/30/2021  [facility] and [place] Number:  Herbalist and Address:  [facility],  [place], [place]      Provider Number: [id number]  Attending Physician Name and Address:  Allie Bossier, MD  Relative Name and Phone Number:  Pranav, Lince [phone]  [phone]  Ivar Drape Granddaughter [phone]  [phone]  [place],([place])Suzanne Other [phone]  [phone]  [facility] Daughter   [phone]    Current Level of Care: Hospital Recommended Level of Care: [place] Prior Approval Number:    Date Approved/Denied:   PASRR Number: [id number]  Discharge Plan: SNF    Current Diagnoses: Patient Active Problem List   Diagnosis Date Noted   AKI (acute kidney injury) ([place]) 07/30/2021   Anemia of chronic disease 07/30/2021   Protein-calorie malnutrition, severe 07/06/2021   Right wrist pain 07/04/2021   UTI (urinary tract infection) 07/03/2021   Hypokalemia 07/03/2021   Scrotal abscess 06/18/2021   Palliative care encounter 06/18/2021   Physical deconditioning 04/28/2021   Bandemia 04/27/2021   Severe sepsis ([place]) 04/26/2021   Essential hypertension 04/26/2021   Infection of bladder catheter ([place]) 04/25/2021   AKI on CKD-3A 04/25/2021   Hyponatremia 04/25/2021   Normocytic anemia 04/25/2021   Pancreatic mass 04/25/2021   Lab test positive for detection of COVID-19 virus 04/15/2021   Prostate cancer metastatic to multiple sites ([place]) 04/15/2021   Chronic indwelling Foley catheter 04/15/2021   Moderate late onset Alzheimer's dementia ([place]) 04/08/2021   Gait abnormality 04/08/2021   Renal insufficiency 03/12/2021   Acute lower UTI 04/30/2020   Renal mass, right 12/04/2019   LBBB (left bundle branch block) 06/07/2019   Family  history of Alzheimer's disease 03/25/2018   Atherosclerotic heart disease of native coronary artery with angina pectoris ([place]) 01/15/2016   CAD-S/P PCI/DES 01/15/2016   Family history of sudden cardiac death 01/29/2016   Dyslipidemia, goal LDL below 70 01/08/2016   Paresthesia 07/18/2015   S/P shoulder replacement 04/20/2015   Malignant neoplasm of prostate ([place]) 02/09/2015   Urethral stricture 07/25/2013   History of prostate cancer 08/04/2011   ED (erectile dysfunction) of organic origin 02/03/2011   Male urinary stress incontinence 01/31/2011   HEMORRHOIDS-EXTERNAL 08/02/2007   INCONTINENCE, FECAL 08/02/2007   PERSONAL HX COLONIC POLYPS 08/02/2007    Orientation RESPIRATION BLADDER Height & Weight     Self, Time, Situation, Place  Normal Incontinent Weight: 137 lb 2 oz (62.2 kg) Height:  '5\' 7"'$  (170.2 cm)  BEHAVIORAL SYMPTOMS/MOOD NEUROLOGICAL BOWEL NUTRITION STATUS      Incontinent Diet (Regular diet)  AMBULATORY STATUS COMMUNICATION OF NEEDS Skin   Limited Assist Verbally Normal                       Personal Care Assistance Level of Assistance  Bathing, Feeding, Dressing Bathing Assistance: Limited assistance Feeding assistance: Independent Dressing Assistance: Limited assistance     Functional Limitations Info  Sight, Hearing, Speech Sight Info: Adequate Hearing Info: Adequate Speech Info: Adequate    SPECIAL CARE FACTORS FREQUENCY  PT (By licensed PT), OT (By licensed OT)     PT Frequency: Minimum 5x a week OT Frequency: Minimum 5x a week            Contractures Contractures Info: Not present    Additional Factors Info  Code  Status, Allergies, Isolation Precautions Code Status Info: DNR Allergies Info: Aricept (Donepezil)   Namzaric (Memantine Hcl-donepezil Hcl)     Isolation Precautions Info: Contact precautions due to ESBL     Current Medications (08/04/2021):  This is the current hospital active medication list Current  Facility-Administered Medications  Medication Dose Route Frequency Provider Last Rate Last Admin   0.9 %  sodium chloride infusion   Intravenous Continuous Allie Bossier, MD 100 mL/hr at 08/04/21 1514 New Bag at 08/04/21 1514   acetaminophen (TYLENOL) tablet 650 mg  650 mg Oral Q6H PRN Howerter, Justin B, DO       Or   acetaminophen (TYLENOL) suppository 650 mg  650 mg Rectal Q6H PRN Howerter, Justin B, DO       acetaminophen (TYLENOL) tablet 1,000 mg  1,000 mg Oral TID Philis Pique, NP   1,000 mg at 08/04/21 1706   amLODipine (NORVASC) tablet 10 mg  10 mg Oral QHS Allie Bossier, MD       aspirin EC tablet 81 mg  81 mg Oral QHS Howerter, Justin B, DO   81 mg at 08/03/21 2101   diclofenac Sodium (VOLTAREN) 1 % topical gel 2 g  2 g Topical BID PRN Howerter, Justin B, DO       feeding supplement (ENSURE ENLIVE / ENSURE PLUS) liquid 237 mL  237 mL Oral BID BM Allie Bossier, MD   237 mL at 08/04/21 1510   memantine (NAMENDA) tablet 10 mg  10 mg Oral BID Howerter, Justin B, DO   10 mg at 08/04/21 0947   meropenem (MERREM) 1 g in sodium chloride 0.9 % 100 mL IVPB  1 g Intravenous Q8H Allie Bossier, MD 200 mL/hr at 08/04/21 0946 1 g at 08/04/21 0946   traMADol (ULTRAM) tablet 50 mg  50 mg Oral BID PRN Howerter, Justin B, DO   50 mg at 07/30/21 2355     Discharge Medications: Please see discharge summary for a list of discharge medications.  Relevant Imaging Results:  Relevant Lab Results:   Additional Information SSN [id number]  Ross Ludwig, LCSW

## 2021-08-04 NOTE — TOC Progression Note (Addendum)
Transition of Care (TOC) - Progression Note    Patient Details  Name: Andre Casey. MRN: [id number] Date of Birth: February 11, 1930  Transition of Care [facility]) CM/SW Contact  Cecil Cobbs Phone Number: 08/04/2021, 6:58 PM  Clinical Narrative:    Per physician family is interested in SNF placement.  CSW to begin bed search for patient.        Expected Discharge Plan and Services                                                 Social Determinants of Health (SDOH) Interventions    Readmission Risk Interventions     No data to display

## 2021-08-04 NOTE — TOC Initial Note (Addendum)
Transition of Care (TOC) - Initial/Assessment Note    Patient Details  Name: Andre Casey. MRN: [id number] Date of Birth: March 21, 1929  Transition of Care [facility]) CM/SW Contact:    Ross Ludwig, LCSW Phone Number: 08/04/2021, 7:04 PM  Clinical Narrative:                  Patient is an 88 year old male who lives alone.  Patient is alert and oriented x4, CSW spoke to [facility] who is the main contact.  Assessment completed by chart review and talking with [name].  Patient was recently at [facility] in [facility] at [facility]).  CSW explained that patient may be in copay days depending how if he has met his out of pocket cost yet.  CSW informed [name] that the facilities will verify what benefits he has and then update CSW if they can offer a bed or not.    Patient's family does want him to go to SNF again.  CSW began bed search in [facility] which is where patient's daughter lives, and in [place] which is where patient's granddaughter lives.  CSW began bed search, patient will need bed offers and insurance authorization before he is able to go to a SNF.  CSW awaiting bed offers.  CSW emailed patient's granddaughter, [email or web address], list of facilities that do short term rehab for patient.  TOC to continue to follow patient's progress throughout discharge planning.  Expected Discharge Plan: Skilled Nursing Facility Barriers to Discharge: Continued Medical Work up, Ship broker   Patient Goals and CMS Choice Patient states their goals for this hospitalization and ongoing recovery are:: To return back home with home health after rehab. CMS Medicare.gov Compare Post Acute Care list provided to:: Patient Represenative (must comment)    Expected Discharge Plan and Services Expected Discharge Plan: [place] Choice: [place] arrangements for the past 2 months: Single Family Home                                       Prior Living Arrangements/Services Living arrangements for the past 2 months: Single Family Home Lives with:: Self Patient language and need for interpreter reviewed:: Yes Do you feel safe going back to the place where you live?: No   Patient's family would like him to go to SNF for rehab, before returning back home.  Need for Family Participation in Patient Care: Yes (Comment) Care giver support system in place?: No (comment)   Criminal Activity/Legal Involvement Pertinent to Current Situation/Hospitalization: No - Comment as needed  Activities of Daily Living Home Assistive Devices/Equipment: Other (Comment), Walker (specify type) (right wrist splint, upper full plate, partial lower plate) ADL Screening (condition at time of admission) Patient's cognitive ability adequate to safely complete daily activities?: No Is the patient deaf or have difficulty hearing?: Yes Does the patient have difficulty seeing, even when wearing glasses/contacts?: Yes (right eye worse) Does the patient have difficulty concentrating, remembering, or making decisions?: No Patient able to express need for assistance with ADLs?: Yes Does the patient have difficulty dressing or bathing?: No Independently performs ADLs?: No Communication: Independent Dressing (OT): Independent Grooming: Independent Feeding: Independent Bathing: Independent Toileting: Needs assistance Is this a change from baseline?: Pre-admission baseline In/Out Bed: Needs assistance Is this a change from baseline?: Pre-admission baseline Walks in Home: Needs assistance  Is this a change from baseline?: Pre-admission baseline Does the patient have difficulty walking or climbing stairs?: Yes Weakness of Legs: Both (both knees hurt) Weakness of Arms/Hands: Right (has right splint on for loss of use of right hand per pt . He can't remember how he hurt it.)  Permission Sought/Granted Permission sought to share  information with : Case Manager, Customer service manager, Family Supports Permission granted to share information with : Yes, Release of Information Signed  Share Information with NAME: Deaken, Jurgens [phone]  [phone]  Trula Ore [phone]  [phone]  [place],([place])Suzanne Other [phone]  [phone]  [facility] Daughter   [phone]  Permission granted to share info w AGENCY: SNF admissions        Emotional Assessment Appearance:: Appears stated age   Affect (typically observed): Calm, Appropriate, Accepting, Stable Orientation: : Oriented to Self, Oriented to Place, Oriented to  Time, Oriented to Situation Alcohol / Substance Use: Not Applicable Psych Involvement: No (comment)  Admission diagnosis:  Severe sepsis ([place]) [A41.9, R65.20] Fever, unspecified fever cause [R50.9] Urinary tract infection without hematuria, site unspecified [N39.0] Sepsis, due to unspecified organism, unspecified whether acute organ dysfunction present [facility]) [A41.9] Patient Active Problem List   Diagnosis Date Noted   AKI (acute kidney injury) ([place]) 07/30/2021   Anemia of chronic disease 07/30/2021   Protein-calorie malnutrition, severe 07/06/2021   Right wrist pain 07/04/2021   UTI (urinary tract infection) 07/03/2021   Hypokalemia 07/03/2021   Scrotal abscess 06/18/2021   Palliative care encounter 06/18/2021   Physical deconditioning 04/28/2021   Bandemia 04/27/2021   Severe sepsis ([place]) 04/26/2021   Essential hypertension 04/26/2021   Infection of bladder catheter ([place]) 04/25/2021   AKI on CKD-3A 04/25/2021   Hyponatremia 04/25/2021   Normocytic anemia 04/25/2021   Pancreatic mass 04/25/2021   Lab test positive for detection of COVID-19 virus 04/15/2021   Prostate cancer metastatic to multiple sites ([place]) 04/15/2021   Chronic indwelling Foley catheter 04/15/2021   Moderate late onset Alzheimer's dementia ([place]) 04/08/2021   Gait abnormality  04/08/2021   Renal insufficiency 03/12/2021   Acute lower UTI 04/30/2020   Renal mass, right 12/04/2019   LBBB (left bundle branch block) 06/07/2019   Family history of Alzheimer's disease 03/25/2018   Atherosclerotic heart disease of native coronary artery with angina pectoris ([place]) 01/15/2016   CAD-S/P PCI/DES 01/15/2016   Family history of sudden cardiac death January 17, 2016   Dyslipidemia, goal LDL below 70 01/08/2016   Paresthesia 07/18/2015   S/P shoulder replacement 04/20/2015   Malignant neoplasm of prostate ([place]) 02/09/2015   Urethral stricture 07/25/2013   History of prostate cancer 08/04/2011   ED (erectile dysfunction) of organic origin 02/03/2011   Male urinary stress incontinence 01/31/2011   HEMORRHOIDS-EXTERNAL 08/02/2007   INCONTINENCE, FECAL 08/02/2007   PERSONAL HX COLONIC POLYPS 08/02/2007   PCP:  [name], MD Pharmacy:   [id number] [place] [place] [id number]: [phone]: [phone]    Social Determinants of Health (SDOH) Interventions    Readmission Risk Interventions     No data to display

## 2021-08-04 NOTE — Progress Notes (Signed)
PROGRESS NOTE    [name].  DJT:[id number] DOB: 01/26/1930 DOA: 07/30/2021 PCP: [name], MD     Brief Narrative:   88 y.o. WM PMHx prostate cancer status post radiation and Lupron injections, essential HTN, CAD s/p PCI with stent placement x2 in November 2017, anemia of chronic disease associated baseline globin 9-12, Renal mass,   Admitted to [facility] on 07/30/2021 with severe sepsis due to urinary tract infection after presenting from home to [facility] ED complaining of fever.    The patient, lives at home, reports 1 day of objective fever, noting temperature max of 103 at home over that timeframe.  He notes associated chills in the absence of full body rigors or generalized myalgias.   Denies any recent headache, neck stiffness, rhinitis, rhinorrhea, sore throat, sob, wheezing, cough, nausea, vomiting, abdominal pain, diarrhea, or rash. No recent traveling or known COVID-19 exposures.  In the context of a history of prostate cancer he underwent suprapubic catheter placement on 05/16/2021.  Patient remains worsening cloudy appearance of associated urine over the last few days.   Denies any recent chest pain, diaphoresis, or palpitations.  No recent lower extremity erythema, calf tenderness, or worsening peripheral edema.   Of note, the patient reportedly recently had an superficial abscess surrounding his suprapubic catheter, for which she underwent IR aspiration on 07/05/2021.   Subjective: 6/11 A/O x1 (does not know where, when, why)    Assessment & Plan: Covid vaccination;   Principal Problem:   Severe sepsis ([place]) Active Problems:   Essential hypertension   Hypokalemia   Acute lower UTI   AKI (acute kidney injury) ([place])   Anemia of chronic disease  Severe sepsis/Complicated UTI -Continue current antibiotic x14 days - 6/7 patient unsure of last time suprapubic catheter changed.  Research EMR, appears quite old. -LR 164m/hr -6/8 DC LR,---> normal  saline 1047mhr  Hypokalemia - Potassium goal> 4 - 6/8 K-Dur 50 mEq  AKI (baseline Cr 0.97 on 5/17) -See sepsis  Lab Results  Component Value Date   CREATININE 0.64 08/04/2021   CREATININE 0.75 08/03/2021   CREATININE 0.70 08/02/2021   CREATININE 0.83 08/01/2021   CREATININE 0.79 07/31/2021  -6/7 resolved  Essential HTN - 6/11 increase Amlodipine 10 mg daily  Anemia of chronic disease (baseline HgB 9-12) Lab Results  Component Value Date   HGB 9.6 (L) 08/04/2021   HGB 9.8 (L) 08/03/2021   HGB 8.9 (L) 08/02/2021   HGB 9.9 (L) 08/01/2021   HGB 10.3 (L) 07/31/2021  -Stable    Goals of care - 6/8 PT/OT consult:   Elderly patient with complicated UTI evaluate for CIR vs SNF.  Work with patient daily on strengthening and ROM: Recommend SNF -6/8 out of bed to chair every shift -6/10 TOC consult: SNF     Mobility Assessment (last 72 hours)     Mobility Assessment     Row Name 08/04/21 0947 08/04/21 0124 08/03/21 0849 08/02/21 2238 08/02/21 1217   Does patient have an order for bedrest or is patient medically unstable No - Continue assessment No - Continue assessment No - Continue assessment No - Continue assessment --   What is the highest level of mobility based on the progressive mobility assessment? Level 4 (Walks with assist in room) - Balance while marching in place and cannot step forward and back - Complete Level 3 (Stands with assist) - Balance while standing  and cannot march in place Level 5 (Walks with assist in room/hall) -  Balance while stepping forward/back and can walk in room with assist - Complete Level 3 (Stands with assist) - Balance while standing  and cannot march in place Level 3 (Stands with assist) - Balance while standing  and cannot march in place   Is the above level different from baseline mobility prior to current illness? Yes - Recommend PT order Yes - Recommend PT order Yes - Recommend PT order Yes - Recommend PT order --    [place] Name 08/02/21 1132  08/02/21 1028 08/01/21 2003       Does patient have an order for bedrest or is patient medically unstable -- No - Continue assessment No - Continue assessment     What is the highest level of mobility based on the progressive mobility assessment? Level 4 (Walks with assist in room) - Balance while marching in place and cannot step forward and back - Complete Level 5 (Walks with assist in room/hall) - Balance while stepping forward/back and can walk in room with assist - Complete Level 3 (Stands with assist) - Balance while standing  and cannot march in place     Is the above level different from baseline mobility prior to current illness? -- Yes - Recommend PT order Yes - Recommend PT order                Interdisciplinary Goals of Care Family Meeting   Date carried out: 08/04/2021  Location of the meeting:   Member's involved:   Durable Power of Tour manager:     Discussion: We discussed goals of care for [employer]. .    Code status:   Disposition:   Time spent for the meeting:     Allie Bossier, MD  08/04/2021, 12:49 PM         DVT prophylaxis: SCD Code Status: DNR Family Communication:  Status is: Inpatient    Dispo: The patient is from: Home              Anticipated d/c is to: Home              Anticipated d/c date is: 3 days              Patient currently is not medically stable to d/c.      Consultants:    Procedures/Significant Events:    I have personally reviewed and interpreted all radiology studies and my findings are as above.  VENTILATOR SETTINGS:    Cultures   Antimicrobials: Anti-infectives (From admission, onward)    Start     Ordered Stop   07/30/21 1730  cefTRIAXone (ROCEPHIN) 2 g in sodium chloride 0.9 % 100 mL IVPB        07/30/21 1719 08/06/21 1729         Devices    LINES / TUBES:      Continuous Infusions:  sodium chloride 100 mL/hr at 08/03/21 1710    meropenem (MERREM) IV 1 g (08/04/21 0946)     Objective: Vitals:   08/03/21 0556 08/03/21 1711 08/03/21 2029 08/04/21 0434  BP: (!) 161/80 (!) 146/91 (!) 167/99 (!) 169/84  Pulse: 72 85 87 77  Resp: '19 18 19 18  '$ Temp: 97.9 F (36.6 C) 98.3 F (36.8 C) 97.6 F (36.4 C) 98.3 F (36.8 C)  TempSrc: Oral Oral Oral Oral  SpO2: 100% 98% 99% 99%  Weight: 62.2 kg     Height:  Intake/Output Summary (Last 24 hours) at 08/04/2021 1249 Last data filed at 08/04/2021 1200 Gross per 24 hour  Intake 3988.17 ml  Output 3800 ml  Net 188.17 ml    Filed Weights   08/01/21 0500 08/02/21 0443 08/03/21 0556  Weight: 55 kg 61 kg 62.2 kg    Examination:  General: A/O x1 (does not know where, when, why) follows all commands, No acute respiratory distress Eyes: negative scleral hemorrhage, negative anisocoria, negative icterus ENT: Negative Runny nose, negative gingival bleeding, Neck:  Negative scars, masses, torticollis, lymphadenopathy, JVD Lungs: Clear to auscultation bilaterally without wheezes or crackles Cardiovascular: Regular rate and rhythm without murmur gallop or rub normal S1 and S2 Abdomen: negative abdominal pain, nondistended, positive soft, bowel sounds, no rebound, no ascites, no appreciable mass, suprapubic cath present appears old.  Negative frank pus around suprapubic cath. Extremities: No significant cyanosis, clubbing, or edema bilateral lower extremities Skin: Negative rashes, lesions, ulcers Psychiatric:  Negative depression, negative anxiety, negative fatigue, negative mania  Central nervous system:  Cranial nerves II through XII intact, tongue/uvula midline, all extremities muscle strength 5/5, sensation intact throughout, negative dysarthria, negative expressive aphasia, negative receptive aphasia.  .     Data Reviewed: Care during the described time interval was provided by me .  I have reviewed this patient's available data, including medical history, events  of note, physical examination, and all test results as part of my evaluation.  CBC: Recent Labs  Lab 07/31/21 0555 08/01/21 0505 08/02/21 0520 08/03/21 0540 08/04/21 0539  WBC 9.3 7.2 5.8 5.1 5.3  NEUTROABS 5.2 4.4 3.4 2.5 2.6  HGB 10.3* 9.9* 8.9* 9.8* 9.6*  HCT 32.2* 32.0* 28.4* 32.0* 31.1*  MCV 86.3 86.0 85.8 85.6 86.9  PLT 256 263 348 379 416*    Basic Metabolic Panel: Recent Labs  Lab 07/31/21 0555 08/01/21 0505 08/02/21 0520 08/03/21 0540 08/04/21 0539  NA 136 135 141 143 141  K 4.2 3.6 4.0 3.6 3.4*  CL 100 102 111 113* 110  CO2 '26 25 24 24 24  '$ GLUCOSE 112* 114* 97 103* 101*  BUN '16 13 13 14 14  '$ CREATININE 0.79 0.83 0.70 0.75 0.64  CALCIUM 8.8* 8.8* 8.5* 8.8* 8.6*  MG 2.0 2.0 2.0 2.1 2.0  PHOS  --  3.1 3.0 2.6 2.4*    GFR: Estimated Creatinine Clearance: 55.1 mL/min (by C-G formula based on SCr of 0.64 mg/dL). Liver Function Tests: Recent Labs  Lab 07/31/21 0555 08/01/21 0505 08/02/21 0520 08/03/21 0540 08/04/21 0539  AST 13* 13* 13* 15 17  ALT '8 7 9 9 11  '$ ALKPHOS 57 57 56 56 49  BILITOT 0.7 0.6 0.5 0.4 0.3  PROT 6.2* 5.8* 5.5* 5.8* 5.8*  ALBUMIN 2.6* 2.4* 2.3* 2.4* 2.3*    No results for input(s): "LIPASE", "AMYLASE" in the last 168 hours. No results for input(s): "AMMONIA" in the last 168 hours. Coagulation Profile: Recent Labs  Lab 07/30/21 1718  INR 1.2    Cardiac Enzymes: No results for input(s): "CKTOTAL", "CKMB", "CKMBINDEX", "TROPONINI" in the last 168 hours. BNP (last 3 results) No results for input(s): "PROBNP" in the last 8760 hours. HbA1C: No results for input(s): "HGBA1C" in the last 72 hours. CBG: No results for input(s): "GLUCAP" in the last 168 hours. Lipid Profile: No results for input(s): "CHOL", "HDL", "LDLCALC", "TRIG", "CHOLHDL", "LDLDIRECT" in the last 72 hours. Thyroid Function Tests: No results for input(s): "TSH", "T4TOTAL", "FREET4", "T3FREE", "THYROIDAB" in the last 72 hours. Anemia Panel: No results for  input(s): "VITAMINB12", "FOLATE", "FERRITIN", "TIBC", "IRON", "RETICCTPCT" in the last 72 hours. Sepsis Labs: Recent Labs  Lab 07/30/21 1718 07/30/21 1918 07/31/21 0555  LATICACIDVEN 5.1* 2.5* 1.4     Recent Results (from the past 240 hour(s))  Culture, blood (x 2)     Status: None (Preliminary result)   Collection Time: 07/30/21  4:45 PM   Specimen: BLOOD  Result Value Ref Range Status   Specimen Description   Final    BLOOD RIGHT ANTECUBITAL Performed at [facility], [place] [address] [place], [place] [address]    Special Requests   Final    BOTTLES DRAWN AEROBIC AND ANAEROBIC Blood Culture results may not be optimal due to an excessive volume of blood received in culture bottles Performed at [place] [address], [place], [place] [address]    Culture   Final    NO GROWTH 4 DAYS Performed at [facility], [place] [address], [place], [place] [address]    Report Status PENDING  Incomplete  Culture, blood (x 2)     Status: None (Preliminary result)   Collection Time: 07/30/21  5:00 PM   Specimen: BLOOD RIGHT ARM  Result Value Ref Range Status   Specimen Description   Final    BLOOD RIGHT ARM Performed at [place] [address] [place], [place] [address]    Special Requests   Final    BOTTLES DRAWN AEROBIC AND ANAEROBIC Blood Culture results may not be optimal due to an excessive volume of blood received in culture bottles Performed at [place] [address], [place], [place] [address]    Culture   Final    NO GROWTH 4 DAYS Performed at [facility], [place] [address] [place], [place] [address]    Report Status PENDING  Incomplete  Urine Culture     Status: Abnormal   Collection Time: 07/30/21  6:58 PM   Specimen: Urine, Catheterized  Result Value Ref Range Status   Specimen Description   Final    URINE, CATHETERIZED Performed at [place] [address] [place], [place] [address]    Special Requests   Final    NONE Performed at [facility], [place] [address] [place], [place] [address]    Culture (A)  Final    >=100,000 COLONIES/mL KLEBSIELLA PNEUMONIAE Confirmed Extended Spectrum Beta-Lactamase Producer (ESBL).  In bloodstream infections from ESBL organisms, carbapenems are preferred over piperacillin/tazobactam. They are shown to have a lower risk of mortality.    Report Status 08/01/2021 FINAL  Final   Organism ID, Bacteria KLEBSIELLA PNEUMONIAE (A)  Final      Susceptibility   Klebsiella pneumoniae - MIC*    AMPICILLIN >=32 RESISTANT Resistant     CEFAZOLIN >=64 RESISTANT Resistant     CEFEPIME 0.5 SENSITIVE Sensitive     CEFTRIAXONE 8 RESISTANT Resistant     CIPROFLOXACIN 1 RESISTANT Resistant     GENTAMICIN >=16 RESISTANT Resistant     IMIPENEM <=0.25 SENSITIVE Sensitive     NITROFURANTOIN 64 INTERMEDIATE Intermediate     TRIMETH/SULFA >=320 RESISTANT Resistant     AMPICILLIN/SULBACTAM 8 SENSITIVE Sensitive     PIP/TAZO <=4 SENSITIVE Sensitive     * >=100,000 COLONIES/mL KLEBSIELLA PNEUMONIAE  Aerobic Culture w Gram Stain (superficial specimen)     Status: None (Preliminary result)   Collection Time: 07/30/21  7:56 PM   Specimen: Skin, Cyst/Tag/Debridement  Result Value  Ref Range Status   Specimen Description   Final    SKIN ABDOMINAL WALL SUPRAPUBIC CATHETER INSERTION SITE Performed at [place] [address], [place], [place] [address]    Special Requests   Final    NONE Performed at [facility], [place] [address], [place], [place] [address]    Gram Stain   Final    NO SQUAMOUS EPITHELIAL CELLS SEEN FEW GRAM POSITIVE RODS FEW GRAM NEGATIVE RODS FEW GRAM POSITIVE COCCI NO WBC SEEN    Culture   Final    FEW KLEBSIELLA PNEUMONIAE FEW STAPHYLOCOCCUS AUREUS SUSCEPTIBILITIES TO FOLLOW Performed at [facility], [place] [address],  [place], [place] [address]    Report Status PENDING  Incomplete         Radiology Studies: No results found.      Scheduled Meds:  acetaminophen  1,000 mg Oral TID   amLODipine  5 mg Oral QHS   aspirin EC  81 mg Oral QHS   feeding supplement  237 mL Oral BID BM   memantine  10 mg Oral BID   Continuous Infusions:  sodium chloride 100 mL/hr at 08/03/21 1710   meropenem (MERREM) IV 1 g (08/04/21 0946)     LOS: 5 days    Time spent:40 min    Maui Britten, Geraldo Docker, MD [facility]   If 7PM-7AM, please contact night-coverage 08/04/2021, 12:49 PM

## 2021-08-05 ENCOUNTER — Other Ambulatory Visit (HOSPITAL_COMMUNITY): Payer: Self-pay

## 2021-08-05 DIAGNOSIS — N179 Acute kidney failure, unspecified: Secondary | ICD-10-CM | POA: Diagnosis not present

## 2021-08-05 DIAGNOSIS — Z515 Encounter for palliative care: Secondary | ICD-10-CM | POA: Diagnosis not present

## 2021-08-05 DIAGNOSIS — D638 Anemia in other chronic diseases classified elsewhere: Secondary | ICD-10-CM | POA: Diagnosis not present

## 2021-08-05 DIAGNOSIS — R509 Fever, unspecified: Secondary | ICD-10-CM

## 2021-08-05 DIAGNOSIS — A419 Sepsis, unspecified organism: Secondary | ICD-10-CM | POA: Diagnosis not present

## 2021-08-05 DIAGNOSIS — Z66 Do not resuscitate: Secondary | ICD-10-CM | POA: Diagnosis not present

## 2021-08-05 DIAGNOSIS — Z7189 Other specified counseling: Secondary | ICD-10-CM | POA: Diagnosis not present

## 2021-08-05 DIAGNOSIS — A4902 Methicillin resistant Staphylococcus aureus infection, unspecified site: Secondary | ICD-10-CM

## 2021-08-05 DIAGNOSIS — B961 Klebsiella pneumoniae [K. pneumoniae] as the cause of diseases classified elsewhere: Secondary | ICD-10-CM

## 2021-08-05 DIAGNOSIS — I10 Essential (primary) hypertension: Secondary | ICD-10-CM | POA: Diagnosis not present

## 2021-08-05 LAB — COMPREHENSIVE METABOLIC PANEL
ALT: 14 U/L (ref 0–44)
AST: 23 U/L (ref 15–41)
Albumin: 2.4 g/dL — ABNORMAL LOW (ref 3.5–5.0)
Alkaline Phosphatase: 54 U/L (ref 38–126)
Anion gap: 6 (ref 5–15)
BUN: 17 mg/dL (ref 8–23)
CO2: 23 mmol/L (ref 22–32)
Calcium: 8.9 mg/dL (ref 8.9–10.3)
Chloride: 113 mmol/L — ABNORMAL HIGH (ref 98–111)
Creatinine, Ser: 0.73 mg/dL (ref 0.61–1.24)
GFR, Estimated: >60 mL/min (ref >60)
Glucose, Bld: 105 mg/dL — ABNORMAL HIGH (ref 70–99)
Potassium: 3.6 mmol/L (ref 3.5–5.1)
Sodium: 142 mmol/L (ref 135–145)
Total Bilirubin: 0.5 mg/dL (ref 0.3–1.2)
Total Protein: 5.8 g/dL — ABNORMAL LOW (ref 6.5–8.1)

## 2021-08-05 LAB — CBC WITH DIFFERENTIAL/PLATELET
Abs Immature Granulocytes: 0.1 10*3/uL — ABNORMAL HIGH (ref 0.00–0.07)
Basophils Absolute: 0 10*3/uL (ref 0.0–0.1)
Basophils Relative: 1 %
Eosinophils Absolute: 0.5 10*3/uL (ref 0.0–0.5)
Eosinophils Relative: 8 %
HCT: 31.8 % — ABNORMAL LOW (ref 39.0–52.0)
Hemoglobin: 9.8 g/dL — ABNORMAL LOW (ref 13.0–17.0)
Immature Granulocytes: 2 %
Lymphocytes Relative: 22 %
Lymphs Abs: 1.4 10*3/uL (ref 0.7–4.0)
MCH: 26.5 pg (ref 26.0–34.0)
MCHC: 30.8 g/dL (ref 30.0–36.0)
MCV: 85.9 fL (ref 80.0–100.0)
Monocytes Absolute: 0.7 10*3/uL (ref 0.1–1.0)
Monocytes Relative: 10 %
Neutro Abs: 3.9 10*3/uL (ref 1.7–7.7)
Neutrophils Relative %: 57 %
Platelets: 473 10*3/uL — ABNORMAL HIGH (ref 150–400)
RBC: 3.7 MIL/uL — ABNORMAL LOW (ref 4.22–5.81)
RDW: 14.3 % (ref 11.5–15.5)
WBC: 6.6 10*3/uL (ref 4.0–10.5)
nRBC: 0 % (ref 0.0–0.2)

## 2021-08-05 LAB — AEROBIC CULTURE W GRAM STAIN (SUPERFICIAL SPECIMEN): Gram Stain: NONE SEEN

## 2021-08-05 LAB — PHOSPHORUS: Phosphorus: 2.7 mg/dL (ref 2.5–4.6)

## 2021-08-05 LAB — MAGNESIUM: Magnesium: 2.2 mg/dL (ref 1.7–2.4)

## 2021-08-05 MED ORDER — VANCOMYCIN HCL IN DEXTROSE 1-5 GM/200ML-% IV SOLN
1000.0000 mg | INTRAVENOUS | Status: DC
  Administered 2021-08-06: 1000 mg via INTRAVENOUS
  Filled 2021-08-05: qty 200

## 2021-08-05 MED ORDER — VANCOMYCIN HCL 1250 MG/250ML IV SOLN
1250.0000 mg | Freq: Once | INTRAVENOUS | Status: AC
  Administered 2021-08-05: 1250 mg via INTRAVENOUS
  Filled 2021-08-05: qty 250

## 2021-08-05 NOTE — Care Management Important Message (Signed)
Important Message  Patient Details IM Letter placed in Patients room. Name: Andre Casey. MRN: [id number] Date of Birth: August 04, 1929   Medicare Important Message Given:  Yes     Kerin Salen 08/05/2021, 12:12 PM

## 2021-08-05 NOTE — Progress Notes (Signed)
PROGRESS NOTE    Andre Casey.  TIR:[id number] DOB: Aug 09, 1929 DOA: 07/30/2021 PCP: [name], MD     Brief Narrative:   88 y.o. WM PMHx prostate cancer status post radiation and Lupron injections, essential HTN, CAD s/p PCI with stent placement x2 in November 2017, anemia of chronic disease associated baseline globin 9-12, Renal mass,   Admitted to [facility] on 07/30/2021 with severe sepsis due to urinary tract infection after presenting from home to [facility] ED complaining of fever.    The patient, lives at home, reports 1 day of objective fever, noting temperature max of 103 at home over that timeframe.  He notes associated chills in the absence of full body rigors or generalized myalgias.   Denies any recent headache, neck stiffness, rhinitis, rhinorrhea, sore throat, sob, wheezing, cough, nausea, vomiting, abdominal pain, diarrhea, or rash. No recent traveling or known COVID-19 exposures.  In the context of a history of prostate cancer he underwent suprapubic catheter placement on 05/16/2021.  Patient remains worsening cloudy appearance of associated urine over the last few days.   Denies any recent chest pain, diaphoresis, or palpitations.  No recent lower extremity erythema, calf tenderness, or worsening peripheral edema.   Of note, the patient reportedly recently had an superficial abscess surrounding his suprapubic catheter, for which she underwent IR aspiration on 07/05/2021.   Subjective: 6/12 afebrile overnight A/O x1 (does not know where, when, why), cooperative   Assessment & Plan: Covid vaccination;   Principal Problem:   Severe sepsis ([place]) Active Problems:   Essential hypertension   Hypokalemia   Acute lower UTI   AKI (acute kidney injury) ([place])   Anemia of chronic disease  Severe sepsis/Complicated UTI/positive Klebsiella pneumonia (ESBL)/MRSA -Continue current antibiotic x14 days - 6/7 patient unsure of last time suprapubic catheter changed.   Research EMR, appears quite old. -LR 132m/hr -6/8 DC LR,---> normal saline 1034mhr -6/12 appears last time patient's suprapubic tube change was by Dr. EvAmalia Haileyn 05/16/2021.  Per office visit to Dr. RoTresa Endorologist on 07/18/2021 patient was supposed to have monthly tube changes.  (Appears these urologists are from [facility]-6/12 in [place] consult local urology to see if they will change out suprapubic catheter.  Hypokalemia - Potassium goal> 4 - 6/8 K-Dur 50 mEq  AKI (baseline Cr 0.97 on 5/17) -See sepsis  Lab Results  Component Value Date   CREATININE 0.73 08/05/2021   CREATININE 0.64 08/04/2021   CREATININE 0.75 08/03/2021   CREATININE 0.70 08/02/2021   CREATININE 0.83 08/01/2021  -6/7 resolved  Essential HTN - 6/11 increase Amlodipine 10 mg daily  Anemia of chronic disease (baseline HgB 9-12) Lab Results  Component Value Date   HGB 9.8 (L) 08/05/2021   HGB 9.6 (L) 08/04/2021   HGB 9.8 (L) 08/03/2021   HGB 8.9 (L) 08/02/2021   HGB 9.9 (L) 08/01/2021  -Stable    Goals of care - 6/8 PT/OT consult:   Elderly patient with complicated UTI evaluate for CIR vs SNF.  Work with patient daily on strengthening and ROM: Recommend SNF -6/8 out of bed to chair every shift -6/10 TOC consult: SNF:Bed offers to be given to grand dtr-[name]-attempted to email -undeliverable;left vm for call back to inform of bed offers prior auth.    -1:18p-Grand dtr [name] chose [employer] to start auth-await auth.     Mobility Assessment (last 72 hours)     Mobility Assessment     Row Name 08/05/21 0849 08/05/21 0746 08/05/21  0745 08/04/21 2020 08/04/21 0947   Does patient have an order for bedrest or is patient medically unstable -- No - Continue assessment No - Continue assessment No - Continue assessment No - Continue assessment   What is the highest level of mobility based on the progressive mobility assessment? Level 5 (Walks with assist in room/hall) - Balance while  stepping forward/back and can walk in room with assist - Complete Level 4 (Walks with assist in room) - Balance while marching in place and cannot step forward and back - Complete Level 4 (Walks with assist in room) - Balance while marching in place and cannot step forward and back - Complete Level 5 (Walks with assist in room/hall) - Balance while stepping forward/back and can walk in room with assist - Complete Level 4 (Walks with assist in room) - Balance while marching in place and cannot step forward and back - Complete   Is the above level different from baseline mobility prior to current illness? -- Yes - Recommend PT order Yes - Recommend PT order -- Yes - Recommend PT order    [place] Name 08/04/21 0124 08/03/21 0849 08/02/21 2238 08/02/21 1217 08/02/21 1132   Does patient have an order for bedrest or is patient medically unstable No - Continue assessment No - Continue assessment No - Continue assessment -- --   What is the highest level of mobility based on the progressive mobility assessment? Level 3 (Stands with assist) - Balance while standing  and cannot march in place Level 5 (Walks with assist in room/hall) - Balance while stepping forward/back and can walk in room with assist - Complete Level 3 (Stands with assist) - Balance while standing  and cannot march in place Level 3 (Stands with assist) - Balance while standing  and cannot march in place Level 4 (Walks with assist in room) - Balance while marching in place and cannot step forward and back - Complete   Is the above level different from baseline mobility prior to current illness? Yes - Recommend PT order Yes - Recommend PT order Yes - Recommend PT order -- --    [place] Name 08/02/21 1028           Does patient have an order for bedrest or is patient medically unstable No - Continue assessment       What is the highest level of mobility based on the progressive mobility assessment? Level 5 (Walks with assist in room/hall) - Balance while  stepping forward/back and can walk in room with assist - Complete       Is the above level different from baseline mobility prior to current illness? Yes - Recommend PT order                  Interdisciplinary Goals of Care Family Meeting   Date carried out: 08/05/2021  Location of the meeting:   Member's involved:   Durable Power of Tour manager:     Discussion: We discussed goals of care for [employer]. .    Code status:   Disposition:   Time spent for the meeting:     Allie Bossier, MD  08/05/2021, 10:13 AM         DVT prophylaxis: SCD Code Status: DNR Family Communication:  Status is: Inpatient    Dispo: The patient is from: Home              Anticipated d/c is to: Home  Anticipated d/c date is: 3 days              Patient currently is not medically stable to d/c.      Consultants:    Procedures/Significant Events:    I have personally reviewed and interpreted all radiology studies and my findings are as above.  VENTILATOR SETTINGS:    Cultures 6/6 urine positive Klebsiella pneumonia (ESBL)/MRSA 6/6 blood right AC negative 6/6 blood right arm negative    Antimicrobials: Anti-infectives (From admission, onward)    Start     Ordered Stop   08/06/21 1130  vancomycin (VANCOCIN) IVPB 1000 mg/200 mL premix       See Hyperspace for full Linked Orders Report.   08/05/21 1030     08/05/21 1130  vancomycin (VANCOREADY) IVPB 1250 mg/250 mL       See Hyperspace for full Linked Orders Report.   08/05/21 1030 08/05/21 1341   08/02/21 1000  meropenem (MERREM) 1 g in sodium chloride 0.9 % 100 mL IVPB        08/02/21 0837     08/01/21 1000  meropenem (MERREM) 1 g in sodium chloride 0.9 % 100 mL IVPB  Status:  Discontinued        08/01/21 0905 08/02/21 0837   07/30/21 1730  cefTRIAXone (ROCEPHIN) 2 g in sodium chloride 0.9 % 100 mL IVPB  Status:  Discontinued        07/30/21 1719 08/01/21  0905           Devices    LINES / TUBES:      Continuous Infusions:  sodium chloride 100 mL/hr at 08/05/21 0556   meropenem (MERREM) IV 1 g (08/05/21 1007)     Objective: Vitals:   08/04/21 1452 08/04/21 2146 08/05/21 0500 08/05/21 0507  BP: (!) 144/82 (!) 144/81  (!) 147/80  Pulse: 81 74  87  Resp: '16 20  18  '$ Temp: 98.4 F (36.9 C) 98.1 F (36.7 C)  98.2 F (36.8 C)  TempSrc: Oral Oral  Oral  SpO2: 98% 98%  97%  Weight:   58.2 kg   Height:        Intake/Output Summary (Last 24 hours) at 08/05/2021 1013 Last data filed at 08/05/2021 1000 Gross per 24 hour  Intake 3821.52 ml  Output 4600 ml  Net -778.48 ml    Filed Weights   08/02/21 0443 08/03/21 0556 08/05/21 0500  Weight: 61 kg 62.2 kg 58.2 kg    Examination:  General: A/O x1 (does not know where, when, why) follows all commands, No acute respiratory distress Eyes: negative scleral hemorrhage, negative anisocoria, negative icterus ENT: Negative Runny nose, negative gingival bleeding, Neck:  Negative scars, masses, torticollis, lymphadenopathy, JVD Lungs: Clear to auscultation bilaterally without wheezes or crackles Cardiovascular: Regular rate and rhythm without murmur gallop or rub normal S1 and S2 Abdomen: negative abdominal pain, nondistended, positive soft, bowel sounds, no rebound, no ascites, no appreciable mass, suprapubic cath present appears old.  Negative frank pus around suprapubic cath. Extremities: No significant cyanosis, clubbing, or edema bilateral lower extremities Skin: Negative rashes, lesions, ulcers Psychiatric:  Negative depression, negative anxiety, negative fatigue, negative mania  Central nervous system:  Cranial nerves II through XII intact, tongue/uvula midline, all extremities muscle strength 5/5, sensation intact throughout, negative dysarthria, negative expressive aphasia, negative receptive aphasia.  .     Data Reviewed: Care during the described time interval was  provided by me .  I have reviewed this patient's available data,  including medical history, events of note, physical examination, and all test results as part of my evaluation.  CBC: Recent Labs  Lab 08/01/21 0505 08/02/21 0520 08/03/21 0540 08/04/21 0539 08/05/21 0358  WBC 7.2 5.8 5.1 5.3 6.6  NEUTROABS 4.4 3.4 2.5 2.6 3.9  HGB 9.9* 8.9* 9.8* 9.6* 9.8*  HCT 32.0* 28.4* 32.0* 31.1* 31.8*  MCV 86.0 85.8 85.6 86.9 85.9  PLT 263 348 379 416* 473*    Basic Metabolic Panel: Recent Labs  Lab 08/01/21 0505 08/02/21 0520 08/03/21 0540 08/04/21 0539 08/05/21 0358  NA 135 141 143 141 142  K 3.6 4.0 3.6 3.4* 3.6  CL 102 111 113* 110 113*  CO2 '25 24 24 24 23  '$ GLUCOSE 114* 97 103* 101* 105*  BUN '13 13 14 14 17  '$ CREATININE 0.83 0.70 0.75 0.64 0.73  CALCIUM 8.8* 8.5* 8.8* 8.6* 8.9  MG 2.0 2.0 2.1 2.0 2.2  PHOS 3.1 3.0 2.6 2.4* 2.7    GFR: Estimated Creatinine Clearance: 51.5 mL/min (by C-G formula based on SCr of 0.73 mg/dL). Liver Function Tests: Recent Labs  Lab 08/01/21 0505 08/02/21 0520 08/03/21 0540 08/04/21 0539 08/05/21 0358  AST 13* 13* '15 17 23  '$ ALT '7 9 9 11 14  '$ ALKPHOS 57 56 56 49 54  BILITOT 0.6 0.5 0.4 0.3 0.5  PROT 5.8* 5.5* 5.8* 5.8* 5.8*  ALBUMIN 2.4* 2.3* 2.4* 2.3* 2.4*    No results for input(s): "LIPASE", "AMYLASE" in the last 168 hours. No results for input(s): "AMMONIA" in the last 168 hours. Coagulation Profile: Recent Labs  Lab 07/30/21 1718  INR 1.2    Cardiac Enzymes: No results for input(s): "CKTOTAL", "CKMB", "CKMBINDEX", "TROPONINI" in the last 168 hours. BNP (last 3 results) No results for input(s): "PROBNP" in the last 8760 hours. HbA1C: No results for input(s): "HGBA1C" in the last 72 hours. CBG: No results for input(s): "GLUCAP" in the last 168 hours. Lipid Profile: No results for input(s): "CHOL", "HDL", "LDLCALC", "TRIG", "CHOLHDL", "LDLDIRECT" in the last 72 hours. Thyroid Function Tests: No results for input(s): "TSH",  "T4TOTAL", "FREET4", "T3FREE", "THYROIDAB" in the last 72 hours. Anemia Panel: No results for input(s): "VITAMINB12", "FOLATE", "FERRITIN", "TIBC", "IRON", "RETICCTPCT" in the last 72 hours. Sepsis Labs: Recent Labs  Lab 07/30/21 1718 07/30/21 1918 07/31/21 0555  LATICACIDVEN 5.1* 2.5* 1.4     Recent Results (from the past 240 hour(s))  Culture, blood (x 2)     Status: None   Collection Time: 07/30/21  4:45 PM   Specimen: BLOOD  Result Value Ref Range Status   Specimen Description   Final    BLOOD RIGHT ANTECUBITAL Performed at [facility] [address] [place], [place] [address]    Special Requests   Final    BOTTLES DRAWN AEROBIC AND ANAEROBIC Blood Culture results may not be optimal due to an excessive volume of blood received in culture bottles Performed at [facility] [address] [place], [place] [address]    Culture   Final    NO GROWTH 5 DAYS Performed at [facility], [place] [address], [place], [place] [address]    Report Status 08/04/2021 FINAL  Final  Culture, blood (x 2)     Status: None   Collection Time: 07/30/21  5:00 PM   Specimen: BLOOD RIGHT ARM  Result Value Ref Range Status   Specimen Description   Final    BLOOD RIGHT ARM Performed at [facility], [address] [place], [place] [address]    Special Requests   Final    BOTTLES DRAWN AEROBIC AND ANAEROBIC Blood Culture results may not be optimal due to an excessive volume of blood received in culture bottles Performed at [facility] [address] [place], [place] [address]    Culture   Final    NO GROWTH 5 DAYS Performed at [facility], [place] [address], [place], [place] [address]    Report Status 08/04/2021 FINAL  Final  Urine Culture     Status: Abnormal   Collection Time: 07/30/21  6:58 PM   Specimen: Urine, Catheterized  Result Value Ref Range Status   Specimen Description   Final    URINE,  CATHETERIZED Performed at [facility] [address], [place], [place] [address]    Special Requests   Final    NONE Performed at [facility], [place] [address] [place], [place] [address]    Culture (A)  Final    >=100,000 COLONIES/mL KLEBSIELLA PNEUMONIAE Confirmed Extended Spectrum Beta-Lactamase Producer (ESBL).  In bloodstream infections from ESBL organisms, carbapenems are preferred over piperacillin/tazobactam. They are shown to have a lower risk of mortality.    Report Status 08/01/2021 FINAL  Final   Organism ID, Bacteria KLEBSIELLA PNEUMONIAE (A)  Final      Susceptibility   Klebsiella pneumoniae - MIC*    AMPICILLIN >=32 RESISTANT Resistant     CEFAZOLIN >=64 RESISTANT Resistant     CEFEPIME 0.5 SENSITIVE Sensitive     CEFTRIAXONE 8 RESISTANT Resistant     CIPROFLOXACIN 1 RESISTANT Resistant     GENTAMICIN >=16 RESISTANT Resistant     IMIPENEM <=0.25 SENSITIVE Sensitive     NITROFURANTOIN 64 INTERMEDIATE Intermediate     TRIMETH/SULFA >=320 RESISTANT Resistant     AMPICILLIN/SULBACTAM 8 SENSITIVE Sensitive     PIP/TAZO <=4 SENSITIVE Sensitive     * >=100,000 COLONIES/mL KLEBSIELLA PNEUMONIAE  Aerobic Culture w Gram Stain (superficial specimen)     Status: None   Collection Time: 07/30/21  7:56 PM   Specimen: Skin, Cyst/Tag/Debridement  Result Value Ref Range Status   Specimen Description   Final    SKIN ABDOMINAL WALL SUPRAPUBIC CATHETER INSERTION SITE Performed at [facility] [address], [place], [place] [address]    Special Requests   Final    NONE Performed at [facility], [place] [address] [place], [place] [address]    Gram Stain   Final    NO SQUAMOUS EPITHELIAL CELLS SEEN FEW GRAM POSITIVE RODS FEW GRAM NEGATIVE RODS FEW GRAM POSITIVE COCCI NO WBC SEEN    Culture   Final    FEW KLEBSIELLA PNEUMONIAE Confirmed Extended Spectrum Beta-Lactamase Producer (ESBL).  In  bloodstream infections from ESBL organisms, carbapenems are preferred over piperacillin/tazobactam. They are shown to have a lower risk of mortality. FEW METHICILLIN RESISTANT STAPHYLOCOCCUS AUREUS ABUNDANT CORYNEBACTERIUM STRIATUM Standardized susceptibility testing for this organism is not available. Performed at [facility], [place] [address] [place], [place] [address]    Report Status 08/05/2021 FINAL  Final   Organism ID, Bacteria KLEBSIELLA PNEUMONIAE  Final   Organism ID, Bacteria METHICILLIN RESISTANT STAPHYLOCOCCUS AUREUS  Final      Susceptibility   Klebsiella pneumoniae - MIC*    AMPICILLIN >=32 RESISTANT Resistant     CEFAZOLIN >=64 RESISTANT Resistant     CEFEPIME 0.5 SENSITIVE Sensitive     CEFTAZIDIME RESISTANT Resistant  CEFTRIAXONE 32 RESISTANT Resistant     CIPROFLOXACIN 1 RESISTANT Resistant     GENTAMICIN >=16 RESISTANT Resistant     IMIPENEM 0.5 SENSITIVE Sensitive     TRIMETH/SULFA >=320 RESISTANT Resistant     AMPICILLIN/SULBACTAM 8 SENSITIVE Sensitive     PIP/TAZO <=4 SENSITIVE Sensitive     * FEW KLEBSIELLA PNEUMONIAE   Methicillin resistant staphylococcus aureus - MIC*    CIPROFLOXACIN >=8 RESISTANT Resistant     ERYTHROMYCIN >=8 RESISTANT Resistant     GENTAMICIN <=0.5 SENSITIVE Sensitive     OXACILLIN >=4 RESISTANT Resistant     TETRACYCLINE <=1 SENSITIVE Sensitive     VANCOMYCIN 1 SENSITIVE Sensitive     TRIMETH/SULFA <=10 SENSITIVE Sensitive     CLINDAMYCIN <=0.25 SENSITIVE Sensitive     RIFAMPIN <=0.5 SENSITIVE Sensitive     Inducible Clindamycin NEGATIVE Sensitive     * FEW METHICILLIN RESISTANT STAPHYLOCOCCUS AUREUS         Radiology Studies: No results found.      Scheduled Meds:  acetaminophen  1,000 mg Oral TID   amLODipine  10 mg Oral QHS   aspirin EC  81 mg Oral QHS   feeding supplement  237 mL Oral BID BM   memantine  10 mg Oral BID   Continuous Infusions:  sodium chloride 100 mL/hr at 08/05/21 0556   meropenem  (MERREM) IV 1 g (08/05/21 1007)     LOS: 6 days    Time spent:40 min    Olan Kurek, Geraldo Docker, MD [facility]   If 7PM-7AM, please contact night-coverage 08/05/2021, 10:13 AM

## 2021-08-05 NOTE — TOC Progression Note (Addendum)
Transition of Care (TOC) - Progression Note    Patient Details  Name: Andre Casey. MRN: [id number] Date of Birth: 21-May-1929  Transition of Care [facility]) CM/SW Contact  Aundray Cartlidge, Juliann Pulse, RN Phone Number: 08/05/2021, 10:40 AM  Clinical Narrative:  Bed offers to be given to grand dtr-[name]-attempted to email -undeliverable;left vm for call back to inform of bed offers prior auth.   -1:18p-Grand dtr [name] chose [employer] aware to start auth-await auth.  1. 1.5 mi [facility] [place], [place] [address] [phone] Overall rating Much below average 2. 1.9 mi [facility] [place] [address] [place], [place] [address] [phone] Overall rating Average 3. 2 mi [facility] [address] [place], [place] [address] [phone] Overall rating Below average 4. 2.4 mi [facility] at the [place], [place] [address] [phone] Overall rating Above average 5. 2.6 mi [facility] [place], [place] [address] [phone] Overall rating Much below average 6. 3.4 mi [place], [place] [address] [phone] Overall rating Much below average 7. 3.7 mi [facility] [place], [place] [address] [phone] Overall rating Much above average 8. 3.9 mi [facility] [place], [place] [address] [phone] Overall rating Much above average 9. [facility] [address] [place], [place] [address] [phone] Overall rating Average 10. 4.2 mi [facility] [address] [place], [place] [address] [phone] Overall rating Much  below average 11. 4.6 mi [facility] [address], [place] [address] [phone] Overall rating Much below average 12. 6.3 mi [facility] [address] [place], [place] [address] [phone] Overall rating Above average 13. 9.2 mi [facility] [place], [place] [address] [phone] Overall rating Below average 14. 9.6 [place], [place] [address] [phone] Overall rating Much above average 15. 9.8 mi [facility] [address], [place] [address] [phone] Overall rating Above average 16. 9.9 mi [facility] [address] [place], [place] [address] [phone] Overall rating Much above average 17. 10.7 mi [place] [facility] [address] [place], [place] [address] [phone] Overall rating Much above average 18. 13.3 [facility] [address] [place], [place] [address] [phone] Overall rating Much below average 19. 13.6 mi [facility] [address] [place], [place] [address] [phone] Overall rating Much below average 20. 14.2 mi [facility] and [facility] [place], [place] [address] [phone] Overall rating Much above average 21. 14.4 [facility] [address] [place], [place] [address] [phone] Overall rating Much below average 22. 15 mi [facility] [place],  [address] [phone] Overall rating Above average 23. 15.2 mi [facility] [address] [place],  [address] [phone] Overall rating Above average 24. 15.3 mi The [facility] [address] [place],  [address] [phone] Overall  rating Below average 25. 16.9 mi [facility] [place] [address]  [place], [place] [address] [phone] Overall rating Average 26. 18.1 mi [place], [place] [address] [phone] Overall rating Below average 27. 18.9 mi [facility] [place], [place] [address] [phone] Overall rating Much below average 28. 19.7 mi [facility] [place], [place] [address] [phone] Overall rating Much below average 29. 20.2 mi [address] at the [facility], [place] [address] [phone] Overall rating Much above average 30. 20.4 mi [facility] [address] [place], [place] [address] [phone] Overall rating Much below average 31. 20.4 mi [facility] [address] [place], [place] [address] [phone] Overall rating Below average 32. 20.6 [place], [place] [address] [phone] Overall rating Much above average 33. 21.2 [address] [place], [place] [address] [phone] Overall rating Below average 34. 22.7 [place] [address] [place], [place] [address] [phone] Overall rating Below average 35. 22.8 mi [employer] [address] [place], [place] [address] [phone] Overall rating Much above average 36. 23.1 mi [facility] [address] [place], [place] [address] [phone] Overall rating Below average 37. 23.5 mi [facility] [address] [place], [place] [address] [phone] Overall rating Above average 38. 23.7 [place], [place] [address] [phone] Overall rating Not available18 39. [place],  [address] [phone] Overall rating Much below average 40. 24.8 mi [place] [address] [place],  [address] [phone] Overall rating Above average To explore and download nursing home data,visit the data catalog on CMS.gov  Expected Discharge Plan: [place] Barriers to Discharge: Continued Medical Work up, Ship broker  Expected Discharge Plan and Services Expected Discharge Plan: [place] Choice: [place] arrangements for the past 2 months: Single Family Home                                       Social Determinants of Health (SDOH) Interventions    Readmission Risk Interventions     No data to display

## 2021-08-05 NOTE — Progress Notes (Signed)
Pharmacy Antibiotic Note  Andre Casey. is a 88 y.o. male admitted on 06/02/1854 with complicated UTI .  Pharmacy has been consulted for Vancomycin dosing.  Andre Casey is an 88 year-old male admitted with fevers. Status post suprapubic catheter placement and patient also has history of metastatic prostate cancer. Purulent drainage noted around catheter, CT with no evidence of abscess. On 5/12, had a superficial abscess surrounding suprapubic catheter and underwent IR aspiration. Cultures from insertion site growing ESBL Klebsiella pneumoniae, MRSA, and  Corynebacterium striatum.   Plan: Start Vancomycin 1250 mg IV x1 dose followed by Vancomycin 1000 mg IV q24h (eAUC 506, Scr 0.8) Meropenem 1g IV q8h per MD Monitor renal function, micro data, and clinical progression Vancomycin levels as indicated  Height: '5\' 7"'$  (170.2 cm) Weight: 58.2 kg (128 lb 4.9 oz) IBW/kg (Calculated) : 66.1  Temp (24hrs), Avg:98.2 F (36.8 C), Min:98.1 F (36.7 C), Max:98.4 F (36.9 C)  Recent Labs  Lab 07/30/21 1718 07/30/21 1918 07/31/21 0555 08/01/21 0505 08/02/21 0520 08/03/21 0540 08/04/21 0539 08/05/21 0358  WBC 13.1*  --  9.3 7.2 5.8 5.1 5.3 6.6  CREATININE 1.36*  --  0.79 0.83 0.70 0.75 0.64 0.73  LATICACIDVEN 5.1* 2.5* 1.4  --   --   --   --   --     Estimated Creatinine Clearance: 51.5 mL/min (by C-G formula based on SCr of 0.73 mg/dL).    Allergies  Allergen Reactions   Aricept [Donepezil] Nausea Only    Report upset stomach - unable to tolerate.   Namzaric [Memantine Hcl-Donepezil Hcl] Other (See Comments)    Stomach upset  Pt able to take Memantine by itself.    Antimicrobials this admission: Meropenem 6/8 >>  Vancomycin 6/12 >>   Microbiology results: 6/6 BCx: NGTD 6/6 UCx: ESBL Klebsiella pneumoniae 6/6 Wound Cx at site:ESBL Klebsiella pneumoniae, MRSA, Corynebacterium striatum   Thank you for allowing pharmacy to be a part of this patient's care.  Lestine Box, PharmD PGY2 Infectious Diseases Pharmacy Resident   Please check [email or web address] for unit-specific pharmacy phone numbers

## 2021-08-05 NOTE — Progress Notes (Signed)
Physical Therapy Treatment Patient Details Name: Andre Casey. MRN: [id number] DOB: 09-Nov-1929 Today's Date: 08/05/2021   History of Present Illness Andre Casey. is a 88 y.o. male admitted with sepsis due to UTI.  Medical history significant of metastatic prostate cancer, right renal mass/pancreatic mass, physical deconditioning, urinary incontinence with indwelling suprapubic catheter, penile prosthesis implant with recent removal due to urinary retention on 05/16/21) CKD, Alzheimers, HTN, recent Covid 19 infection (04/15/21), normocytic anemia and CAD    PT Comments    Pt is progressing well with therapy. Pt ambulated 180 ft needing Min assist with chair follow for safety Pt able to use BSC twice. Currently pt requires Min guard for bed mobility, and Min guard to [employer] for transfers and gait. VS stable, HR max of 131 during gait and recovered to 100 bpm at rest. Recommending continued skilled therapy while at hospital and SNF upon hospital DC. Will progress as able.   Recommendations for follow up therapy are one component of a multi-disciplinary discharge planning process, led by the attending physician.  Recommendations may be updated based on patient status, additional functional criteria and insurance authorization.  Follow Up Recommendations  Skilled nursing-short term rehab (<3 hours/day)     Assistance Recommended at Discharge Frequent or constant Supervision/Assistance  Patient can return home with the following A little help with walking and/or transfers;A little help with bathing/dressing/bathroom;Assistance with cooking/housework;Direct supervision/assist for medications management;Direct supervision/assist for financial management;Assist for transportation;Help with stairs or ramp for entrance   Equipment Recommendations  None recommended by PT    Recommendations for Other Services       Precautions / Restrictions Precautions Precautions:  Fall Precaution Comments: Painful knees Required Braces or Orthoses: Splint/Cast Splint/Cast: Rt forearm brace Splint/Cast - Date Prophylactic Dressing Applied (if applicable): 81/82/99 Restrictions Weight Bearing Restrictions: No     Mobility  Bed Mobility Overal bed mobility: Needs Assistance Bed Mobility: Supine to Sit     Supine to sit: HOB elevated, Min guard     General bed mobility comments: Bed lowered, pt needed min guard for VC's    Transfers Overall transfer level: Needs assistance Equipment used: Rolling walker (2 wheels) Transfers: Sit to/from Stand, Bed to chair/wheelchair/BSC Sit to Stand: Min assist, Min guard   Step pivot transfers: Min assist       General transfer comment: Pt started with Min guard assist, relying on RW and verbal cues, pt initiated power up with Min guard. Min assist needed to complete rise.     Ambulation/Gait Ambulation/Gait assistance: Min assist Gait Distance (Feet): 180 Feet Assistive device: Rolling walker (2 wheels) Gait Pattern/deviations: Step-through pattern, Decreased stride length, Trunk flexed, Narrow base of support Gait velocity: decr     General Gait Details: Pt ambulated with Min assist needing VC's and recliner rolled behind, cues for safe pace to deter pt from rushing. HR increased to max of 131 bpm after 60 ft, pt instructed to stop for standing rest break and HR decreased to 120 bpm. Pt asymptomatic.   Stairs             Wheelchair Mobility    Modified Rankin (Stroke Patients Only)       Balance Overall balance assessment: Needs assistance Sitting-balance support: No upper extremity supported Sitting balance-Leahy Scale: Good     Standing balance support: During functional activity, Reliant on assistive device for balance, Bilateral upper extremity supported Standing balance-Leahy Scale: Fair Standing balance comment: Pt demonstrates improvement with less trunk flexion  while standing.                             Cognition Arousal/Alertness: Awake/alert Behavior During Therapy: WFL for tasks assessed/performed Overall Cognitive Status: Within Functional Limits for tasks assessed                                 General Comments: Pt is pleasant, completed all tasks instructed.        Exercises      General Comments        Pertinent Vitals/Pain Pain Assessment Pain Assessment: No/denies pain    Home Living                          Prior Function            PT Goals (current goals can now be found in the care plan section) Acute Rehab PT Goals Patient Stated Goal: Feel better PT Goal Formulation: With patient Time For Goal Achievement: 08/16/21 Potential to Achieve Goals: Good Progress towards PT goals: Progressing toward goals    Frequency    Min 2X/week      PT Plan Current plan remains appropriate    Co-evaluation              AM-PAC PT "6 Clicks" Mobility   Outcome Measure  Help needed turning from your back to your side while in a flat bed without using bedrails?: None Help needed moving from lying on your back to sitting on the side of a flat bed without using bedrails?: A Little Help needed moving to and from a bed to a chair (including a wheelchair)?: A Little Help needed standing up from a chair using your arms (e.g., wheelchair or bedside chair)?: A Little Help needed to walk in hospital room?: A Little Help needed climbing 3-5 steps with a railing? : A Little 6 Click Score: 19    End of Session Equipment Utilized During Treatment: Gait belt Activity Tolerance: Patient tolerated treatment well Patient left: in chair;with call bell/phone within reach;with chair alarm set Nurse Communication: Mobility status PT Visit Diagnosis: Unsteadiness on feet (R26.81);Muscle weakness (generalized) (M62.81)     Time: 2440-1027 PT Time Calculation (min) (ACUTE ONLY): 35 min  Charges:  $Gait Training:  8-22 mins $Therapeutic Activity: 8-22 mins                     Margie Ege, SPT [place] 08/05/2021, 10:13 AM

## 2021-08-05 NOTE — Progress Notes (Signed)
  Daily Progress Note   Patient Name: Andre Casey.       Date: 08/05/2021 DOB: 1929/12/28  Age: 88 y.o. MRN#: [id number] Attending Physician: Allie Bossier, MD Primary Care Physician: [name], MD Admit Date: 07/30/2021 Length of Stay: 6 days  Reason for Consultation/Follow-up: {Reason for Consult:23484}  HPI/Patient Profile:  ***  Subjective:   Subjective: Chart Reviewed. Updates received. Patient Assessed. Created space and opportunity for patient  and family to explore thoughts and feelings regarding current medical situation.  Today's Discussion: ***  Review of Systems  Objective:   Vital Signs:  BP (!) 158/82 (BP Location: Right Arm)   Pulse 84   Temp 98.2 F (36.8 C) (Oral)   Resp 20   Ht '5\' 7"'$  (1.702 m)   Wt 58.2 kg   SpO2 97%   BMI 20.10 kg/m   Physical Exam: Physical Exam  Palliative Assessment/Data: ***   Assessment & Plan:   Impression: Present on Admission:  Severe sepsis ([place])  Acute lower UTI  Hypokalemia  AKI (acute kidney injury) ([place])  Essential hypertension  Anemia of chronic disease  ***  SUMMARY OF RECOMMENDATIONS   ***  Symptom Management:  ***  Code Status: {Palliative Code status:23503}  Prognosis: {Palliative Care Prognosis:23504}  Discharge Planning: {Palliative dispostion:23505}  Discussed with: ***  Thank you for allowing [place] to participate in the care of [employer]. PMT will continue to support holistically.  Time Total: ***  Visit consisted of counseling and education dealing with the complex and emotionally intense issues of symptom management and palliative care in the setting of serious and potentially life-threatening illness. Greater than 50%  of this time was spent counseling and coordinating care related to the above assessment and plan.  Walden Field, NP Palliative Medicine Team  Team Phone # [phone] (Nights/Weekends)  10/23/2020, 8:17 AM

## 2021-08-06 DIAGNOSIS — N179 Acute kidney failure, unspecified: Secondary | ICD-10-CM | POA: Diagnosis not present

## 2021-08-06 DIAGNOSIS — A419 Sepsis, unspecified organism: Secondary | ICD-10-CM | POA: Diagnosis not present

## 2021-08-06 DIAGNOSIS — D638 Anemia in other chronic diseases classified elsewhere: Secondary | ICD-10-CM | POA: Diagnosis not present

## 2021-08-06 DIAGNOSIS — I10 Essential (primary) hypertension: Secondary | ICD-10-CM | POA: Diagnosis not present

## 2021-08-06 LAB — COMPREHENSIVE METABOLIC PANEL
ALT: 21 U/L (ref 0–44)
AST: 29 U/L (ref 15–41)
Albumin: 2.6 g/dL — ABNORMAL LOW (ref 3.5–5.0)
Alkaline Phosphatase: 71 U/L (ref 38–126)
Anion gap: 7 (ref 5–15)
BUN: 20 mg/dL (ref 8–23)
CO2: 22 mmol/L (ref 22–32)
Calcium: 8.7 mg/dL — ABNORMAL LOW (ref 8.9–10.3)
Chloride: 110 mmol/L (ref 98–111)
Creatinine, Ser: 0.82 mg/dL (ref 0.61–1.24)
GFR, Estimated: >60 mL/min (ref >60)
Glucose, Bld: 112 mg/dL — ABNORMAL HIGH (ref 70–99)
Potassium: 3.5 mmol/L (ref 3.5–5.1)
Sodium: 139 mmol/L (ref 135–145)
Total Bilirubin: 0.4 mg/dL (ref 0.3–1.2)
Total Protein: 6.1 g/dL — ABNORMAL LOW (ref 6.5–8.1)

## 2021-08-06 LAB — CBC WITH DIFFERENTIAL/PLATELET
Abs Immature Granulocytes: 0.1 10*3/uL — ABNORMAL HIGH (ref 0.00–0.07)
Basophils Absolute: 0 10*3/uL (ref 0.0–0.1)
Basophils Relative: 0 %
Eosinophils Absolute: 0.4 10*3/uL (ref 0.0–0.5)
Eosinophils Relative: 3 %
HCT: 31.6 % — ABNORMAL LOW (ref 39.0–52.0)
Hemoglobin: 9.9 g/dL — ABNORMAL LOW (ref 13.0–17.0)
Immature Granulocytes: 1 %
Lymphocytes Relative: 15 %
Lymphs Abs: 1.8 10*3/uL (ref 0.7–4.0)
MCH: 27 pg (ref 26.0–34.0)
MCHC: 31.3 g/dL (ref 30.0–36.0)
MCV: 86.1 fL (ref 80.0–100.0)
Monocytes Absolute: 1.2 10*3/uL — ABNORMAL HIGH (ref 0.1–1.0)
Monocytes Relative: 10 %
Neutro Abs: 8.3 10*3/uL — ABNORMAL HIGH (ref 1.7–7.7)
Neutrophils Relative %: 71 %
Platelets: 483 10*3/uL — ABNORMAL HIGH (ref 150–400)
RBC: 3.67 MIL/uL — ABNORMAL LOW (ref 4.22–5.81)
RDW: 14.6 % (ref 11.5–15.5)
WBC: 11.9 10*3/uL — ABNORMAL HIGH (ref 4.0–10.5)
nRBC: 0 % (ref 0.0–0.2)

## 2021-08-06 LAB — MAGNESIUM: Magnesium: 2.1 mg/dL (ref 1.7–2.4)

## 2021-08-06 LAB — PHOSPHORUS: Phosphorus: 2.9 mg/dL (ref 2.5–4.6)

## 2021-08-06 MED ORDER — LINEZOLID 600 MG PO TABS
600.0000 mg | ORAL_TABLET | Freq: Two times a day (BID) | ORAL | Status: DC
  Administered 2021-08-07 – 2021-08-08 (×3): 600 mg via ORAL
  Filled 2021-08-06 (×5): qty 1

## 2021-08-06 NOTE — Progress Notes (Addendum)
PROGRESS NOTE    Andre Casey.  JQB:[id number] DOB: December 03, 1929 DOA: 07/30/2021 PCP: [name], MD     Brief Narrative:   88 y.o. WM PMHx prostate cancer status post radiation and Lupron injections, essential HTN, CAD s/p PCI with stent placement x2 in November 2017, anemia of chronic disease associated baseline globin 9-12, Renal mass,   Admitted to [facility] on 07/30/2021 with severe sepsis due to urinary tract infection after presenting from home to [facility] ED complaining of fever.    The patient, lives at home, reports 1 day of objective fever, noting temperature max of 103 at home over that timeframe.  He notes associated chills in the absence of full body rigors or generalized myalgias.   Denies any recent headache, neck stiffness, rhinitis, rhinorrhea, sore throat, sob, wheezing, cough, nausea, vomiting, abdominal pain, diarrhea, or rash. No recent traveling or known COVID-19 exposures.  In the context of a history of prostate cancer he underwent suprapubic catheter placement on 05/16/2021.  Patient remains worsening cloudy appearance of associated urine over the last few days.   Denies any recent chest pain, diaphoresis, or palpitations.  No recent lower extremity erythema, calf tenderness, or worsening peripheral edema.   Of note, the patient reportedly recently had an superficial abscess surrounding his suprapubic catheter, for which she underwent IR aspiration on 07/05/2021.   Subjective: 6/13 A/O x1 (does not know where, when, why), cooperative   Assessment & Plan: Covid vaccination;   Principal Problem:   Severe sepsis ([place]) Active Problems:   Essential hypertension   Hypokalemia   Acute lower UTI   AKI (acute kidney injury) ([place])   Anemia of chronic disease  Severe sepsis/Complicated UTI/positive Klebsiella pneumonia (ESBL)/MRSA -Continue current antibiotic x14 days - 6/7 patient unsure of last time suprapubic catheter changed.  Research EMR,  appears quite old. -LR 167m/hr -6/8 DC LR,---> normal saline 1063mhr -6/12 appears last time patient's suprapubic tube change was by Dr. EvAmalia Haileyn 05/16/2021.  Per office visit to Dr. RoTresa Endorologist on 07/18/2021 patient was supposed to have monthly tube changes.  ([facility]-6/12 in [place] consult local urology to see if they will change out suprapubic catheter. -6/13 discussed case with pharmacy in order to prevent patient requiring additional mid line/PICC will change antibiotic course to the following;  Zyvox 600 mg BID x 5 days  Meropenem will be completed COB 6/14 - Patient will be ready for transport to SNF on 6/15. -6/13 for RN to change suprapubic cath prior to discharge, should occur today  Hypokalemia - Potassium goal> 4 - 6/8 K-Dur 50 mEq  AKI (baseline Cr 0.97 on 5/17) -See sepsis  Lab Results  Component Value Date   CREATININE 0.82 08/06/2021   CREATININE 0.73 08/05/2021   CREATININE 0.64 08/04/2021   CREATININE 0.75 08/03/2021   CREATININE 0.70 08/02/2021  -6/7 resolved  Essential HTN - 6/11 increase Amlodipine 10 mg daily  Anemia of chronic disease (baseline HgB 9-12) Lab Results  Component Value Date   HGB 9.9 (L) 08/06/2021   HGB 9.8 (L) 08/05/2021   HGB 9.6 (L) 08/04/2021   HGB 9.8 (L) 08/03/2021   HGB 8.9 (L) 08/02/2021  -Stable  Severe protein calorie malnutrition -6/13 supplements per dietitian    Goals of care - 6/8 PT/OT consult:   Elderly patient with complicated UTI evaluate for CIR vs SNF.  Work with patient daily on strengthening and ROM: Recommend SNF -6/8 out of bed to chair  every shift -6/10 TOC consult: SNF:Bed offers to be given to grand dtr-Andre Casey-attempted to email -undeliverable;left vm for call back to inform of bed offers prior auth.  -1:18p-Grand dtr Andre Casey chose [employer] aware to start auth-await auth. -6/13 TOC;- Patient will be ready for transport to SNF on 6/15.  See  severe sepsis     Mobility Assessment (last 72 hours)     Mobility Assessment     Row Name 08/05/21 1955 08/05/21 0849 08/05/21 0746 08/05/21 0745 08/04/21 2020   Does patient have an order for bedrest or is patient medically unstable No - Continue assessment -- No - Continue assessment No - Continue assessment No - Continue assessment   What is the highest level of mobility based on the progressive mobility assessment? Level 5 (Walks with assist in room/hall) - Balance while stepping forward/back and can walk in room with assist - Complete Level 5 (Walks with assist in room/hall) - Balance while stepping forward/back and can walk in room with assist - Complete Level 4 (Walks with assist in room) - Balance while marching in place and cannot step forward and back - Complete Level 4 (Walks with assist in room) - Balance while marching in place and cannot step forward and back - Complete Level 5 (Walks with assist in room/hall) - Balance while stepping forward/back and can walk in room with assist - Complete   Is the above level different from baseline mobility prior to current illness? -- -- Yes - Recommend PT order Yes - Recommend PT order --    Row Name 08/04/21 0947 08/04/21 0124         Does patient have an order for bedrest or is patient medically unstable No - Continue assessment No - Continue assessment      What is the highest level of mobility based on the progressive mobility assessment? Level 4 (Walks with assist in room) - Balance while marching in place and cannot step forward and back - Complete Level 3 (Stands with assist) - Balance while standing  and cannot march in place      Is the above level different from baseline mobility prior to current illness? Yes - Recommend PT order Yes - Recommend PT order                 Interdisciplinary Goals of Care Family Meeting   Date carried out: 08/06/2021  Location of the meeting:   Member's involved:   Durable Power of  Tour manager:     Discussion: We discussed goals of care for [employer]. .    Code status:   Disposition:   Time spent for the meeting:     Allie Bossier, MD  08/06/2021, 11:24 AM         DVT prophylaxis: SCD Code Status: DNR Family Communication:  Status is: Inpatient    Dispo: The patient is from: Home              Anticipated d/c is to: Home              Anticipated d/c date is: 3 days              Patient currently is not medically stable to d/c.      Consultants:    Procedures/Significant Events:    I have personally reviewed and interpreted all radiology studies and my findings are as above.  VENTILATOR SETTINGS:    Cultures 6/6 urine  positive Klebsiella pneumonia (ESBL)/MRSA 6/6 blood right AC negative 6/6 blood right arm negative    Antimicrobials: Anti-infectives (From admission, onward)    Start     Ordered Stop   08/06/21 1130  vancomycin (VANCOCIN) IVPB 1000 mg/200 mL premix       See Hyperspace for full Linked Orders Report.   08/05/21 1030     08/05/21 1130  vancomycin (VANCOREADY) IVPB 1250 mg/250 mL       See Hyperspace for full Linked Orders Report.   08/05/21 1030 08/05/21 1341   08/02/21 1000  meropenem (MERREM) 1 g in sodium chloride 0.9 % 100 mL IVPB        08/02/21 0837     08/01/21 1000  meropenem (MERREM) 1 g in sodium chloride 0.9 % 100 mL IVPB  Status:  Discontinued        08/01/21 0905 08/02/21 0837   07/30/21 1730  cefTRIAXone (ROCEPHIN) 2 g in sodium chloride 0.9 % 100 mL IVPB  Status:  Discontinued        07/30/21 1719 08/01/21 0905           Devices    LINES / TUBES:      Continuous Infusions:  sodium chloride 100 mL/hr at 08/05/21 1754   meropenem (MERREM) IV 1 g (08/06/21 1022)   vancomycin 1,000 mg (08/06/21 1118)     Objective: Vitals:   08/05/21 0507 08/05/21 1213 08/05/21 2206 08/06/21 0607  BP: (!) 147/80 (!) 158/82 (!) 159/99 (!) 147/81   Pulse: 87 84 91 82  Resp: '18 20 16 20  '$ Temp: 98.2 F (36.8 C) 98.2 F (36.8 C) 98.3 F (36.8 C) 98.7 F (37.1 C)  TempSrc: Oral Oral Oral Oral  SpO2: 97% 97% 98% 96%  Weight:      Height:        Intake/Output Summary (Last 24 hours) at 08/06/2021 1124 Last data filed at 08/06/2021 1118 Gross per 24 hour  Intake 3782.58 ml  Output 2250 ml  Net 1532.58 ml    Filed Weights   08/02/21 0443 08/03/21 0556 08/05/21 0500  Weight: 61 kg 62.2 kg 58.2 kg    Examination:  General: A/O x1 (does not know where, when, why) follows all commands, No acute respiratory distress Eyes: negative scleral hemorrhage, negative anisocoria, negative icterus ENT: Negative Runny nose, negative gingival bleeding, Neck:  Negative scars, masses, torticollis, lymphadenopathy, JVD Lungs: Clear to auscultation bilaterally without wheezes or crackles Cardiovascular: Regular rate and rhythm without murmur gallop or rub normal S1 and S2 Abdomen: negative abdominal pain, nondistended, positive soft, bowel sounds, no rebound, no ascites, no appreciable mass, suprapubic cath present appears old.  Negative frank pus around suprapubic cath. Extremities: No significant cyanosis, clubbing, or edema bilateral lower extremities Skin: Negative rashes, lesions, ulcers Psychiatric:  Negative depression, negative anxiety, negative fatigue, negative mania  Central nervous system:  Cranial nerves II through XII intact, tongue/uvula midline, all extremities muscle strength 5/5, sensation intact throughout, negative dysarthria, negative expressive aphasia, negative receptive aphasia.  .     Data Reviewed: Care during the described time interval was provided by me .  I have reviewed this patient's available data, including medical history, events of note, physical examination, and all test results as part of my evaluation.  CBC: Recent Labs  Lab 08/02/21 0520 08/03/21 0540 08/04/21 0539 08/05/21 0358 08/06/21 0324   WBC 5.8 5.1 5.3 6.6 11.9*  NEUTROABS 3.4 2.5 2.6 3.9 8.3*  HGB 8.9* 9.8* 9.6* 9.8* 9.9*  HCT 28.4* 32.0* 31.1* 31.8* 31.6*  MCV 85.8 85.6 86.9 85.9 86.1  PLT 348 379 416* 473* 483*    Basic Metabolic Panel: Recent Labs  Lab 08/02/21 0520 08/03/21 0540 08/04/21 0539 08/05/21 0358 08/06/21 0324  NA 141 143 141 142 139  K 4.0 3.6 3.4* 3.6 3.5  CL 111 113* 110 113* 110  CO2 '24 24 24 23 22  '$ GLUCOSE 97 103* 101* 105* 112*  BUN '13 14 14 17 20  '$ CREATININE 0.70 0.75 0.64 0.73 0.82  CALCIUM 8.5* 8.8* 8.6* 8.9 8.7*  MG 2.0 2.1 2.0 2.2 2.1  PHOS 3.0 2.6 2.4* 2.7 2.9    GFR: Estimated Creatinine Clearance: 50.3 mL/min (by C-G formula based on SCr of 0.82 mg/dL). Liver Function Tests: Recent Labs  Lab 08/02/21 0520 08/03/21 0540 08/04/21 0539 08/05/21 0358 08/06/21 0324  AST 13* '15 17 23 29  '$ ALT '9 9 11 14 21  '$ ALKPHOS 56 56 49 54 71  BILITOT 0.5 0.4 0.3 0.5 0.4  PROT 5.5* 5.8* 5.8* 5.8* 6.1*  ALBUMIN 2.3* 2.4* 2.3* 2.4* 2.6*    No results for input(s): "LIPASE", "AMYLASE" in the last 168 hours. No results for input(s): "AMMONIA" in the last 168 hours. Coagulation Profile: Recent Labs  Lab 07/30/21 1718  INR 1.2    Cardiac Enzymes: No results for input(s): "CKTOTAL", "CKMB", "CKMBINDEX", "TROPONINI" in the last 168 hours. BNP (last 3 results) No results for input(s): "PROBNP" in the last 8760 hours. HbA1C: No results for input(s): "HGBA1C" in the last 72 hours. CBG: No results for input(s): "GLUCAP" in the last 168 hours. Lipid Profile: No results for input(s): "CHOL", "HDL", "LDLCALC", "TRIG", "CHOLHDL", "LDLDIRECT" in the last 72 hours. Thyroid Function Tests: No results for input(s): "TSH", "T4TOTAL", "FREET4", "T3FREE", "THYROIDAB" in the last 72 hours. Anemia Panel: No results for input(s): "VITAMINB12", "FOLATE", "FERRITIN", "TIBC", "IRON", "RETICCTPCT" in the last 72 hours. Sepsis Labs: Recent Labs  Lab 07/30/21 1718 07/30/21 1918 07/31/21 0555   LATICACIDVEN 5.1* 2.5* 1.4     Recent Results (from the past 240 hour(s))  Culture, blood (x 2)     Status: None   Collection Time: 07/30/21  4:45 PM   Specimen: BLOOD  Result Value Ref Range Status   Specimen Description   Final    BLOOD RIGHT ANTECUBITAL Performed at [facility] [address], [place], [place] [address]    Special Requests   Final    BOTTLES DRAWN AEROBIC AND ANAEROBIC Blood Culture results may not be optimal due to an excessive volume of blood received in culture bottles Performed at [facility] [address] [place], [place] [address]    Culture   Final    NO GROWTH 5 DAYS Performed at [facility], [place] [address] [place], [place] [address]    Report Status 08/04/2021 FINAL  Final  Culture, blood (x 2)     Status: None   Collection Time: 07/30/21  5:00 PM   Specimen: BLOOD RIGHT ARM  Result Value Ref Range Status   Specimen Description   Final    BLOOD RIGHT ARM Performed at [facility] [address], [place], [place] [address]    Special Requests   Final    BOTTLES DRAWN AEROBIC AND ANAEROBIC Blood Culture results may not be optimal due to an excessive volume of blood received in culture bottles Performed at [facility] [address], [place],  [address]  Culture   Final    NO GROWTH 5 DAYS Performed at [facility], [place] [address], [place], [place] [address]    Report Status 08/04/2021 FINAL  Final  Urine Culture     Status: Abnormal   Collection Time: 07/30/21  6:58 PM   Specimen: Urine, Catheterized  Result Value Ref Range Status   Specimen Description   Final    URINE, CATHETERIZED Performed at  [facility] [address] [place], [place] [address]    Special Requests   Final    NONE Performed at [facility], [place] [address], [place], [place] [address]    Culture (A)  Final    >=100,000  COLONIES/mL KLEBSIELLA PNEUMONIAE Confirmed Extended Spectrum Beta-Lactamase Producer (ESBL).  In bloodstream infections from ESBL organisms, carbapenems are preferred over piperacillin/tazobactam. They are shown to have a lower risk of mortality.    Report Status 08/01/2021 FINAL  Final   Organism ID, Bacteria KLEBSIELLA PNEUMONIAE (A)  Final      Susceptibility   Klebsiella pneumoniae - MIC*    AMPICILLIN >=32 RESISTANT Resistant     CEFAZOLIN >=64 RESISTANT Resistant     CEFEPIME 0.5 SENSITIVE Sensitive     CEFTRIAXONE 8 RESISTANT Resistant     CIPROFLOXACIN 1 RESISTANT Resistant     GENTAMICIN >=16 RESISTANT Resistant     IMIPENEM <=0.25 SENSITIVE Sensitive     NITROFURANTOIN 64 INTERMEDIATE Intermediate     TRIMETH/SULFA >=320 RESISTANT Resistant     AMPICILLIN/SULBACTAM 8 SENSITIVE Sensitive     PIP/TAZO <=4 SENSITIVE Sensitive     * >=100,000 COLONIES/mL KLEBSIELLA PNEUMONIAE  Aerobic Culture w Gram Stain (superficial specimen)     Status: None   Collection Time: 07/30/21  7:56 PM   Specimen: Skin, Cyst/Tag/Debridement  Result Value Ref Range Status   Specimen Description   Final    SKIN ABDOMINAL WALL SUPRAPUBIC CATHETER INSERTION SITE Performed at [facility] [address] [place], [place] [address]    Special Requests   Final    NONE Performed at [facility], [place] [address] [place], [place] [address]    Gram Stain   Final    NO SQUAMOUS EPITHELIAL CELLS SEEN FEW GRAM POSITIVE RODS FEW GRAM NEGATIVE RODS FEW GRAM POSITIVE COCCI NO WBC SEEN    Culture   Final    FEW KLEBSIELLA PNEUMONIAE Confirmed Extended Spectrum Beta-Lactamase Producer (ESBL).  In bloodstream infections from ESBL organisms, carbapenems are preferred over piperacillin/tazobactam. They are shown to have a lower risk of mortality. FEW METHICILLIN RESISTANT STAPHYLOCOCCUS AUREUS ABUNDANT CORYNEBACTERIUM STRIATUM Standardized susceptibility testing for this  organism is not available. Performed at [facility], [place] [address], [place], [place] [address]    Report Status 08/05/2021 FINAL  Final   Organism ID, Bacteria KLEBSIELLA PNEUMONIAE  Final   Organism ID, Bacteria METHICILLIN RESISTANT STAPHYLOCOCCUS AUREUS  Final      Susceptibility   Klebsiella pneumoniae - MIC*    AMPICILLIN >=32 RESISTANT Resistant     CEFAZOLIN >=64 RESISTANT Resistant     CEFEPIME 0.5 SENSITIVE Sensitive     CEFTAZIDIME RESISTANT Resistant     CEFTRIAXONE 32 RESISTANT Resistant     CIPROFLOXACIN 1 RESISTANT Resistant     GENTAMICIN >=16 RESISTANT Resistant     IMIPENEM 0.5 SENSITIVE Sensitive     TRIMETH/SULFA >=320 RESISTANT Resistant     AMPICILLIN/SULBACTAM 8 SENSITIVE Sensitive     PIP/TAZO <=4 SENSITIVE Sensitive     *  FEW KLEBSIELLA PNEUMONIAE   Methicillin resistant staphylococcus aureus - MIC*    CIPROFLOXACIN >=8 RESISTANT Resistant     ERYTHROMYCIN >=8 RESISTANT Resistant     GENTAMICIN <=0.5 SENSITIVE Sensitive     OXACILLIN >=4 RESISTANT Resistant     TETRACYCLINE <=1 SENSITIVE Sensitive     VANCOMYCIN 1 SENSITIVE Sensitive     TRIMETH/SULFA <=10 SENSITIVE Sensitive     CLINDAMYCIN <=0.25 SENSITIVE Sensitive     RIFAMPIN <=0.5 SENSITIVE Sensitive     Inducible Clindamycin NEGATIVE Sensitive     * FEW METHICILLIN RESISTANT STAPHYLOCOCCUS AUREUS         Radiology Studies: No results found.      Scheduled Meds:  acetaminophen  1,000 mg Oral TID   amLODipine  10 mg Oral QHS   aspirin EC  81 mg Oral QHS   feeding supplement  237 mL Oral BID BM   memantine  10 mg Oral BID   Continuous Infusions:  sodium chloride 100 mL/hr at 08/05/21 1754   meropenem (MERREM) IV 1 g (08/06/21 1022)   vancomycin 1,000 mg (08/06/21 1118)     LOS: 7 days    Time spent:40 min    Larena Ohnemus, Geraldo Docker, MD [facility]   If 7PM-7AM, please contact night-coverage 08/06/2021, 11:24 AM

## 2021-08-06 NOTE — Progress Notes (Signed)
Occupational Therapy Treatment Patient Details Name: Andre Casey. MRN: [id number] DOB: Dec 24, 1929 Today's Date: 08/06/2021   History of present illness Andre Casey. is a 88 y.o. male admitted with sepsis due to UTI.  Medical history significant of metastatic prostate cancer, right renal mass/pancreatic mass, physical deconditioning, urinary incontinence with indwelling suprapubic catheter, penile prosthesis implant with recent removal due to urinary retention on 05/16/21) CKD, Alzheimers, HTN, recent Covid 19 infection (04/15/21), normocytic anemia and CAD   OT comments  Patient awoken for OT session, and motivated for out of bed activities. Pt progressing and showed improved tolerance to standing ADLs with no mention of knee pain, compared to previous session.  Pt able to perform toilet transfer with Min As, and stand at sink for hand hygiene prior to lunch with Min guard for balance without UE support. Patient remains limited by stooped posture, generalized weakness and decreased activity tolerance along with deficits noted below. Pt continues to demonstrate very good rehab potential and would benefit from continued skilled OT to increase safety and independence with ADLs and functional transfers to allow pt to return home safely and reduce caregiver burden and fall risk.    Recommendations for follow up therapy are one component of a multi-disciplinary discharge planning process, led by the attending physician.  Recommendations may be updated based on patient status, additional functional criteria and insurance authorization.    Follow Up Recommendations  Skilled nursing-short term rehab (<3 hours/day)    Assistance Recommended at Discharge Frequent or constant Supervision/Assistance  Patient can return home with the following  Assistance with cooking/housework;Assist for transportation;Help with stairs or ramp for entrance;A lot of help with walking and/or transfers;A lot  of help with bathing/dressing/bathroom   Equipment Recommendations       Recommendations for Other Services      Precautions / Restrictions Precautions Precautions: Fall Precaution Comments: Painful knees Required Braces or Orthoses: Splint/Cast Splint/Cast: Rt forearm brace Restrictions Weight Bearing Restrictions: No       Mobility Bed Mobility Overal bed mobility: Needs Assistance Bed Mobility: Supine to Sit     Supine to sit: HOB elevated, Supervision     General bed mobility comments: Increased time/effort for supine to sit but able to scoot anteriorly to complete full transition from supine->Sit with supevision.    Transfers                         Balance Overall balance assessment: Needs assistance Sitting-balance support: No upper extremity supported Sitting balance-Leahy Scale: Good     Standing balance support: During functional activity, No upper extremity supported Standing balance-Leahy Scale: Fair Standing balance comment: Able to stand for hand hygiene without UE support with Min guard.                           ADL either performed or assessed with clinical judgement   ADL Overall ADL's : Needs assistance/impaired Eating/Feeding: Set up;Sitting Eating/Feeding Details (indicate cue type and reason): Pt ended session in recliner with assist setting up lunch. Grooming: Wash/dry hands;Standing;Min guard Grooming Details (indicate cue type and reason): Pt stood at sink for hand hgyiene prior to lunch with Min guard for balance.                 Toilet Transfer: Minimal Teacher, English as a foreign language;Ambulation;Grab bars Toilet Transfer Details (indicate cue type and reason): Pt stood from EOB with Minimal assist and used RW  to ambulate ~10'x2 to/from bathroom with Min As. Pt descended to toilet with Min As and cues to use grab bar.  Pt stood from toilet with Min As. Pt very stooped during transfers and ambulation. Toileting-  Clothing Manipulation and Hygiene: Minimal assistance;Sitting/lateral lean       Functional mobility during ADLs: Minimal assistance;Rolling walker (2 wheels)      Extremity/Trunk Assessment Upper Extremity Assessment RUE Deficits / Details: WFL ROM, reports right wrist pain, has wrist splint LUE Deficits / Details: WFL ROM and strength grossly 4-/5       Cervical / Trunk Assessment Cervical / Trunk Assessment: Kyphotic    Vision       Perception     Praxis      Cognition Arousal/Alertness: Awake/alert Behavior During Therapy: WFL for tasks assessed/performed Overall Cognitive Status: Within Functional Limits for tasks assessed                                          Exercises      Shoulder Instructions       General Comments      Pertinent Vitals/ Pain       Pain Assessment Pain Assessment: No/denies pain  Home Living                                          Prior Functioning/Environment              Frequency  Min 2X/week        Progress Toward Goals  OT Goals(current goals can now be found in the care plan section)  Progress towards OT goals: Progressing toward goals  Acute Rehab OT Goals Patient Stated Goal: Return to rehab facility OT Goal Formulation: With patient Time For Goal Achievement: 08/16/21 Potential to Achieve Goals: Good  Plan Discharge plan remains appropriate    Co-evaluation                 AM-PAC OT "6 Clicks" Daily Activity     Outcome Measure   Help from another person eating meals?: None Help from another person taking care of personal grooming?: A Little Help from another person toileting, which includes using toliet, bedpan, or urinal?: A Little Help from another person bathing (including washing, rinsing, drying)?: A Lot Help from another person to put on and taking off regular upper body clothing?: A Little Help from another person to put on and taking off regular  lower body clothing?: A Lot 6 Click Score: 17    End of Session Equipment Utilized During Treatment: Rolling walker (2 wheels);Gait belt  OT Visit Diagnosis: Pain;Muscle weakness (generalized) (M62.81);Unsteadiness on feet (R26.81) Pain - part of body: Knee   Activity Tolerance Patient tolerated treatment well   Patient Left in chair;with call bell/phone within reach;with chair alarm set   Nurse Communication Mobility status;Other (comment) (IV beeping)        Time: 6468-0321 OT Time Calculation (min): 27 min  Charges: OT General Charges $OT Visit: 1 Visit OT Treatments $Self Care/Home Management : 8-22 mins $Therapeutic Activity: 8-22 mins  Anderson Malta, OT Acute Rehab Services Office: [phone] 08/06/2021  Andre Casey 08/06/2021, 1:16 PM

## 2021-08-06 NOTE — TOC Progression Note (Addendum)
Transition of Care (TOC) - Progression Note    Patient Details  Name: Andre Casey. MRN: [id number] Date of Birth: Feb 02, 1930  Transition of Care [facility]) CM/SW Contact  Arlina Sabina, Juliann Pulse, RN Phone Number: 08/06/2021, 11:19 AM  Clinical Narrative: Aida Puffer for [facility] SNF-awaiting medical stabiliity & d/c summary. -2:43p-facility can accept in am w/iv abx,midline. Await d/c in am. MD to sign DNR, & d/c summary in am if stable.      Expected Discharge Plan: [place] Barriers to Discharge: Continued Medical Work up, Ship broker  Expected Discharge Plan and Services Expected Discharge Plan: [place] Choice: [place] arrangements for the past 2 months: Single Family Home                                       Social Determinants of Health (SDOH) Interventions    Readmission Risk Interventions     No data to display

## 2021-08-07 DIAGNOSIS — I10 Essential (primary) hypertension: Secondary | ICD-10-CM | POA: Diagnosis not present

## 2021-08-07 DIAGNOSIS — A419 Sepsis, unspecified organism: Secondary | ICD-10-CM | POA: Diagnosis not present

## 2021-08-07 DIAGNOSIS — N179 Acute kidney failure, unspecified: Secondary | ICD-10-CM | POA: Diagnosis not present

## 2021-08-07 DIAGNOSIS — D638 Anemia in other chronic diseases classified elsewhere: Secondary | ICD-10-CM | POA: Diagnosis not present

## 2021-08-07 LAB — CBC WITH DIFFERENTIAL/PLATELET
Abs Immature Granulocytes: 0.14 10*3/uL — ABNORMAL HIGH (ref 0.00–0.07)
Basophils Absolute: 0 10*3/uL (ref 0.0–0.1)
Basophils Relative: 0 %
Eosinophils Absolute: 0.4 10*3/uL (ref 0.0–0.5)
Eosinophils Relative: 3 %
HCT: 30.9 % — ABNORMAL LOW (ref 39.0–52.0)
Hemoglobin: 9.6 g/dL — ABNORMAL LOW (ref 13.0–17.0)
Immature Granulocytes: 1 %
Lymphocytes Relative: 13 %
Lymphs Abs: 1.6 10*3/uL (ref 0.7–4.0)
MCH: 26.4 pg (ref 26.0–34.0)
MCHC: 31.1 g/dL (ref 30.0–36.0)
MCV: 84.9 fL (ref 80.0–100.0)
Monocytes Absolute: 1.3 10*3/uL — ABNORMAL HIGH (ref 0.1–1.0)
Monocytes Relative: 11 %
Neutro Abs: 8.6 10*3/uL — ABNORMAL HIGH (ref 1.7–7.7)
Neutrophils Relative %: 72 %
Platelets: 486 10*3/uL — ABNORMAL HIGH (ref 150–400)
RBC: 3.64 MIL/uL — ABNORMAL LOW (ref 4.22–5.81)
RDW: 14.7 % (ref 11.5–15.5)
WBC: 12.2 10*3/uL — ABNORMAL HIGH (ref 4.0–10.5)
nRBC: 0 % (ref 0.0–0.2)

## 2021-08-07 LAB — COMPREHENSIVE METABOLIC PANEL
ALT: 17 U/L (ref 0–44)
AST: 19 U/L (ref 15–41)
Albumin: 2.6 g/dL — ABNORMAL LOW (ref 3.5–5.0)
Alkaline Phosphatase: 71 U/L (ref 38–126)
Anion gap: 7 (ref 5–15)
BUN: 18 mg/dL (ref 8–23)
CO2: 24 mmol/L (ref 22–32)
Calcium: 8.7 mg/dL — ABNORMAL LOW (ref 8.9–10.3)
Chloride: 106 mmol/L (ref 98–111)
Creatinine, Ser: 0.78 mg/dL (ref 0.61–1.24)
GFR, Estimated: >60 mL/min (ref >60)
Glucose, Bld: 114 mg/dL — ABNORMAL HIGH (ref 70–99)
Potassium: 3 mmol/L — ABNORMAL LOW (ref 3.5–5.1)
Sodium: 137 mmol/L (ref 135–145)
Total Bilirubin: 0.5 mg/dL (ref 0.3–1.2)
Total Protein: 6.1 g/dL — ABNORMAL LOW (ref 6.5–8.1)

## 2021-08-07 LAB — PHOSPHORUS: Phosphorus: 2.7 mg/dL (ref 2.5–4.6)

## 2021-08-07 LAB — MAGNESIUM: Magnesium: 2.2 mg/dL (ref 1.7–2.4)

## 2021-08-07 MED ORDER — POTASSIUM CHLORIDE CRYS ER 20 MEQ PO TBCR
40.0000 meq | EXTENDED_RELEASE_TABLET | Freq: Two times a day (BID) | ORAL | Status: AC
Start: 2021-08-07 — End: 2021-08-07
  Administered 2021-08-07 (×2): 40 meq via ORAL
  Filled 2021-08-07 (×2): qty 2

## 2021-08-07 NOTE — Hospital Course (Addendum)
The patient is an 88 y.o. WM PMHx significant for but not limited to prostate cancer status post radiation and Lupron injections, essential HTN, CAD s/p PCI with stent placement x2 in November 2017, anemia of chronic disease associated baseline globin 9-12, Renal mass and other comorbidites.   The patient was Admitted to [facility] on 07/30/2021 with severe sepsis due to urinary tract infection after presenting from home to [facility] ED complaining of fever.    The patient, lives at home, reports 1 day of objective fever, noting temperature max of 103 at home over that timeframe.  He notes associated chills in the absence of full body rigors or generalized myalgias.   Denies any recent headache, neck stiffness, rhinitis, rhinorrhea, sore throat, sob, wheezing, cough, nausea, vomiting, abdominal pain, diarrhea, or rash. No recent traveling or known COVID-19 exposures.  In the context of a history of prostate cancer he underwent suprapubic catheter placement on 05/16/2021.  Patient remains worsening cloudy appearance of associated urine over the last few days.   Denies any recent chest pain, diaphoresis, or palpitations.  No recent lower extremity erythema, calf tenderness, or worsening peripheral edema.   Of note, the patient reportedly recently had an superficial abscess surrounding his suprapubic catheter, for which she underwent IR aspiration on 07/05/2021.  **Interim History Patient was admitted for severe sepsis and complicated UTI with positive Klebsiella pneumoniae which is ESBL and MRSA.  Patient was placed on antibiotics and his vancomycin was changed to Zyvox and meropenem and will continue treatment until 6/22.  WBC started worsening and he did spike another temperature so he may need to repeat blood cultures but improved and now stable for D/C.  PT OT recommending SNF and he is Medically stable to D/C to SNF with outpatient Urological Follow up.

## 2021-08-07 NOTE — Progress Notes (Signed)
PROGRESS NOTE    Andre Casey.  TFT:[id number] DOB: December 21, 1929 DOA: 07/30/2021 PCP: [name], MD   Brief Narrative:  The patient is an 88 y.o. WM PMHx significant for but not limited to prostate cancer status post radiation and Lupron injections, essential HTN, CAD s/p PCI with stent placement x2 in November 2017, anemia of chronic disease associated baseline globin 9-12, Renal mass and other comorbidites.   [facility] with severe sepsis due to urinary tract infection after presenting from home to [facility] ED complaining of fever.    The patient, lives at home, reports 1 day of objective fever, noting temperature max of 103 at home over that timeframe.  He notes associated chills in the absence of full body rigors or generalized myalgias.   Denies any recent headache, neck stiffness, rhinitis, rhinorrhea, sore throat, sob, wheezing, cough, nausea, vomiting, abdominal pain, diarrhea, or rash. No recent traveling or known COVID-19 exposures.  In the context of a history of prostate cancer he underwent suprapubic catheter placement on 05/16/2021.  Patient remains worsening cloudy appearance of associated urine over the last few days.   Denies any recent chest pain, diaphoresis, or palpitations.  No recent lower extremity erythema, calf tenderness, or worsening peripheral edema.   Of note, the patient reportedly recently had an superficial abscess surrounding his suprapubic catheter, for which she underwent IR aspiration on 07/05/2021.  **Interim History Patient was admitted for severe sepsis and complicated UTI with positive Klebsiella pneumoniae which is ESBL and MRSA.  Patient was placed on antibiotics and his vancomycin was changed to Zyvox and meropenem will be completed.  WBC started worsening and he did spike another temperature so he may need to repeat blood cultures.  PT OT recommending SNF     Assessment and Plan:   Severe  sepsis/Complicated UTI/positive Klebsiella pneumonia (ESBL)/MRSA -Continue current antibiotic x14 days - 6/7 patient unsure of last time suprapubic catheter changed.  Research EMR, appears quite old. -LR 184m/hr -6/8 DC LR,---> normal saline 1073mhr -6/12 appears last time patient's suprapubic tube change was by Dr. EvAmalia Haileyn 05/16/2021.  Per office visit to Dr. RoTresa Endorologist on 07/18/2021 patient was supposed to have monthly tube changes.  (Appears [facility]rolog[facility] are from [facility]-6/12 in [place] consult local urology to see if they will change out suprapubic catheter. -6/13 discussed case with pharmacy in order to prevent patient requiring additional mid line/PICC will change antibiotic course to the following;             Zyvox 600 mg BID x 5 days             Meropenem will be completed COB 6/14 - Patient's WBC is worsening and he had a temperature today so we need to continue to monitor carefully. -6/13 for RN to change suprapubic cath prior to discharge, and this was done on 08/06/2021 -If patient continues to spike temperatures and WBC continues worsen need to repeat cultures and consult urology versus ID -WBC is worsening and went from 5.1 -> 6.6 -> 11.9 -> 12.2    Hypokalemia -Potassium was 3.0 -Replete -Continue to monitor replete as necessary -Repeat CMP in the a.m.   AKI (baseline Cr 0.97 on 5/17) -AKI is improved and his BUNs/creatinine is now 18/0.78 -Avoid further nephrotoxic medications, contrast dyes, hypotension and dehydration and renally dose medications -Repeat CMP in a.m.   Essential HTN -6/11 increase Amlodipine 10 mg daily -Continue to monitor blood  pressures per protocol -Last blood pressure reading was 146/82   Anemia of chronic disease (baseline HgB 9-12) -Stable -Patient's hemoglobin/hematocrit is now stable at 9.6/30.9 -Check anemia panel in the AM -Continue to monitor for signs and symptoms of bleeding; no overt bleeding noted   Severe  protein calorie malnutrition Hypoalbuminemia -6/13 supplements per dietitian -Nutrition Status: Nutrition Problem: Severe Malnutrition Etiology: chronic illness, cancer and cancer related treatments Signs/Symptoms: severe fat depletion, severe muscle depletion Interventions: Ensure Enlive (each supplement provides 350kcal and 20 grams of protein)  Thrombocytosis -Platelet Count went from 348 -> 379 -> 416 -> 473 -> 483 -> 486 -Continue to Monitor and Trend -Repeat CBC in the AM     Goals of care - 6/8 PT/OT consult:   Elderly patient with complicated UTI evaluate for CIR vs SNF.  Work with patient daily on strengthening and ROM: Recommend SNF -6/8 out of bed to chair every shift -6/10 TOC consult: SNF:Bed offers to be given to grand dtr-[name]-attempted to email -undeliverable;left vm for call back to inform of bed offers prior auth.  -1:18p-Grand dtr [name] chose [employer] aware to start auth-await auth. -6/13 TOC assisting with placement    DVT prophylaxis: SCDs Start: 07/30/21 2121    Code Status: DNR Family Communication: No family currently at bedside  Disposition Plan:  Level of care: Med-Surg Status is: Inpatient Remains inpatient appropriate because: Needs SNF but his WBC is worsening and he spiked a temperature again today   Consultants:  None  Procedures:  Foley Cath Change  Antimicrobials:  Anti-infectives (From admission, onward)    Start     Dose/Rate Route Frequency Ordered Stop   08/07/21 1000  linezolid (ZYVOX) tablet 600 mg        600 mg Oral Every 12 hours 08/06/21 1758 08/12/21 0959   08/06/21 1130  vancomycin (VANCOCIN) IVPB 1000 mg/200 mL premix  Status:  Discontinued       See Hyperspace for full Linked Orders Report.   1,000 mg 200 mL/hr over 60 Minutes Intravenous Every 24 hours 08/05/21 1030 08/06/21 1758   08/05/21 1130  vancomycin (VANCOREADY) IVPB 1250 mg/250 mL       See Hyperspace for full Linked Orders Report.   1,250  mg 166.7 mL/hr over 90 Minutes Intravenous  Once 08/05/21 1030 08/05/21 1341   08/02/21 1000  meropenem (MERREM) 1 g in sodium chloride 0.9 % 100 mL IVPB        1 g 200 mL/hr over 30 Minutes Intravenous Every 8 hours 08/02/21 0837     08/01/21 1000  meropenem (MERREM) 1 g in sodium chloride 0.9 % 100 mL IVPB  Status:  Discontinued        1 g 200 mL/hr over 30 Minutes Intravenous Every 12 hours 08/01/21 0905 08/02/21 0837   07/30/21 1730  cefTRIAXone (ROCEPHIN) 2 g in sodium chloride 0.9 % 100 mL IVPB  Status:  Discontinued        2 g 200 mL/hr over 30 Minutes Intravenous Every 24 hours 07/30/21 1719 08/01/21 0905       Subjective: Seen and examined at bedside and he was doing okay but spiked a temperature.  No nausea or vomiting.  Feels okay.  No other concerns or plaints this time  Objective: Vitals:   08/06/21 2038 08/07/21 0959 08/07/21 1148 08/07/21 1713  BP: 133/88 (!) 151/80 (!) 150/70 (!) 146/82  Pulse: 76 92 80 72  Resp: '20 18 17 16  '$ Temp: 98.6 F (37 C) (!)  100.5 F (38.1 C) 98.6 F (37 C) 97.7 F (36.5 C)  TempSrc: Oral Oral    SpO2: 100% 98% 98% 98%  Weight:      Height:        Intake/Output Summary (Last 24 hours) at 08/07/2021 1928 Last data filed at 08/07/2021 1811 Gross per 24 hour  Intake 1540 ml  Output 2250 ml  Net -710 ml   Filed Weights   08/02/21 0443 08/03/21 0556 08/05/21 0500  Weight: 61 kg 62.2 kg 58.2 kg   Examination: Physical Exam:  Constitutional: Elderly chronically ill-appearing overweight Caucasian male currently no acute distress Respiratory: Used to auscultation bilaterally, no wheezing, rales, rhonchi or crackles. Normal respiratory effort and patient is not tachypenic. No accessory muscle use.  Cardiovascular: RRR, no murmurs / rubs / gallops. S1 and S2 auscultated. No extremity edema.  Abdomen: Soft, non-tender, distended secondary body habitus. Bowel sounds positive.  GU: Deferred. Suprapubic Cath in place Musculoskeletal: No  clubbing / cyanosis of digits/nails. No joint deformity upper and lower extremities.  Neurologic: CN 2-12 grossly intact with no focal deficits. Romberg sign and cerebellar reflexes not assessed.  Psychiatric: Normal judgment and insight. Alert and oriented x 3. Normal mood and appropriate affect.   Data Reviewed: I have personally reviewed following labs and imaging studies  CBC: Recent Labs  Lab 08/03/21 0540 08/04/21 0539 08/05/21 0358 08/06/21 0324 08/07/21 0422  WBC 5.1 5.3 6.6 11.9* 12.2*  NEUTROABS 2.5 2.6 3.9 8.3* 8.6*  HGB 9.8* 9.6* 9.8* 9.9* 9.6*  HCT 32.0* 31.1* 31.8* 31.6* 30.9*  MCV 85.6 86.9 85.9 86.1 84.9  PLT 379 416* 473* 483* 878*   Basic Metabolic Panel: Recent Labs  Lab 08/03/21 0540 08/04/21 0539 08/05/21 0358 08/06/21 0324 08/07/21 0422  NA 143 141 142 139 137  K 3.6 3.4* 3.6 3.5 3.0*  CL 113* 110 113* 110 106  CO2 '24 24 23 22 24  '$ GLUCOSE 103* 101* 105* 112* 114*  BUN '14 14 17 20 18  '$ CREATININE 0.75 0.64 0.73 0.82 0.78  CALCIUM 8.8* 8.6* 8.9 8.7* 8.7*  MG 2.1 2.0 2.2 2.1 2.2  PHOS 2.6 2.4* 2.7 2.9 2.7   GFR: Estimated Creatinine Clearance: 51.5 mL/min (by C-G formula based on SCr of 0.78 mg/dL). Liver Function Tests: Recent Labs  Lab 08/03/21 0540 08/04/21 0539 08/05/21 0358 08/06/21 0324 08/07/21 0422  AST '15 17 23 29 19  '$ ALT '9 11 14 21 17  '$ ALKPHOS 56 49 54 71 71  BILITOT 0.4 0.3 0.5 0.4 0.5  PROT 5.8* 5.8* 5.8* 6.1* 6.1*  ALBUMIN 2.4* 2.3* 2.4* 2.6* 2.6*   No results for input(s): "LIPASE", "AMYLASE" in the last 168 hours. No results for input(s): "AMMONIA" in the last 168 hours. Coagulation Profile: No results for input(s): "INR", "PROTIME" in the last 168 hours. Cardiac Enzymes: No results for input(s): "CKTOTAL", "CKMB", "CKMBINDEX", "TROPONINI" in the last 168 hours. BNP (last 3 results) No results for input(s): "PROBNP" in the last 8760 hours. HbA1C: No results for input(s): "HGBA1C" in the last 72 hours. CBG: No results  for input(s): "GLUCAP" in the last 168 hours. Lipid Profile: No results for input(s): "CHOL", "HDL", "LDLCALC", "TRIG", "CHOLHDL", "LDLDIRECT" in the last 72 hours. Thyroid Function Tests: No results for input(s): "TSH", "T4TOTAL", "FREET4", "T3FREE", "THYROIDAB" in the last 72 hours. Anemia Panel: No results for input(s): "VITAMINB12", "FOLATE", "FERRITIN", "TIBC", "IRON", "RETICCTPCT" in the last 72 hours. Sepsis Labs: No results for input(s): "PROCALCITON", "LATICACIDVEN" in the last 168 hours.  Recent Results (from the past 240 hour(s))  Culture, blood (x 2)     Status: None   Collection Time: 07/30/21  4:45 PM   Specimen: BLOOD  Result Value Ref Range Status   Specimen Description   Final    BLOOD RIGHT ANTECUBITAL Performed at [facility] [address], [place], [place] [address]    Special Requests   Final    BOTTLES DRAWN AEROBIC AND ANAEROBIC Blood Culture results may not be optimal due to an excessive volume of blood received in culture bottles Performed at [facility] [address], [place], [place] [address]    Culture   Final    NO GROWTH 5 DAYS Performed at [facility], [place] [address], [place], [place] [address]    Report Status 08/04/2021 FINAL  Final  Culture, blood (x 2)     Status: None   Collection Time: 07/30/21  5:00 PM   Specimen: BLOOD RIGHT ARM  Result Value Ref Range Status   Specimen Description   Final    BLOOD RIGHT ARM Performed at [facility] [address] [place], [place] [address]    Special Requests   Final    BOTTLES DRAWN AEROBIC AND ANAEROBIC Blood Culture results may not be optimal due to an excessive volume of blood received in culture bottles Performed at [facility] [address], [place], [place] [address]    Culture   Final    NO GROWTH 5 DAYS Performed at [facility], [place] [address] [place], [place] [address]    Report Status  08/04/2021 FINAL  Final  Urine Culture     Status: Abnormal   Collection Time: 07/30/21  6:58 PM   Specimen: Urine, Catheterized  Result Value Ref Range Status   Specimen Description   Final    URINE, CATHETERIZED Performed at [facility] [address], [place], [place] [address]    Special Requests   Final    NONE Performed at [facility], [place] [address] [place], [place] [address]    Culture (A)  Final    >=100,000 COLONIES/mL KLEBSIELLA PNEUMONIAE Confirmed Extended Spectrum Beta-Lactamase Producer (ESBL).  In bloodstream infections from ESBL organisms, carbapenems are preferred over piperacillin/tazobactam. They are shown to have a lower risk of mortality.    Report Status 08/01/2021 FINAL  Final   Organism ID, Bacteria KLEBSIELLA PNEUMONIAE (A)  Final      Susceptibility   Klebsiella pneumoniae - MIC*    AMPICILLIN >=32 RESISTANT Resistant     CEFAZOLIN >=64 RESISTANT Resistant     CEFEPIME 0.5 SENSITIVE Sensitive     CEFTRIAXONE 8 RESISTANT Resistant     CIPROFLOXACIN 1 RESISTANT Resistant     GENTAMICIN >=16 RESISTANT Resistant     IMIPENEM <=0.25 SENSITIVE Sensitive     NITROFURANTOIN 64 INTERMEDIATE Intermediate     TRIMETH/SULFA >=320 RESISTANT Resistant     AMPICILLIN/SULBACTAM 8 SENSITIVE Sensitive     PIP/TAZO <=4 SENSITIVE Sensitive     * >=100,000 COLONIES/mL KLEBSIELLA PNEUMONIAE  Aerobic Culture w Gram Stain (superficial specimen)     Status: None   Collection Time: 07/30/21  7:56 PM   Specimen: Skin, Cyst/Tag/Debridement  Result Value Ref Range Status   Specimen Description   Final    SKIN ABDOMINAL WALL SUPRAPUBIC CATHETER INSERTION SITE Performed at [facility] [address], [place], [place] [address]    Special  Requests   Final    NONE Performed at [facility], [place] [address] [place], [place] [address]    Gram Stain   Final    NO SQUAMOUS EPITHELIAL CELLS SEEN FEW  GRAM POSITIVE RODS FEW GRAM NEGATIVE RODS FEW GRAM POSITIVE COCCI NO WBC SEEN    Culture   Final    FEW KLEBSIELLA PNEUMONIAE Confirmed Extended Spectrum Beta-Lactamase Producer (ESBL).  In bloodstream infections from ESBL organisms, carbapenems are preferred over piperacillin/tazobactam. They are shown to have a lower risk of mortality. FEW METHICILLIN RESISTANT STAPHYLOCOCCUS AUREUS ABUNDANT CORYNEBACTERIUM STRIATUM Standardized susceptibility testing for this organism is not available. Performed at [facility], [place] [address], [place], [place] [address]    Report Status 08/05/2021 FINAL  Final   Organism ID, Bacteria KLEBSIELLA PNEUMONIAE  Final   Organism ID, Bacteria METHICILLIN RESISTANT STAPHYLOCOCCUS AUREUS  Final      Susceptibility   Klebsiella pneumoniae - MIC*    AMPICILLIN >=32 RESISTANT Resistant     CEFAZOLIN >=64 RESISTANT Resistant     CEFEPIME 0.5 SENSITIVE Sensitive     CEFTAZIDIME RESISTANT Resistant     CEFTRIAXONE 32 RESISTANT Resistant     CIPROFLOXACIN 1 RESISTANT Resistant     GENTAMICIN >=16 RESISTANT Resistant     IMIPENEM 0.5 SENSITIVE Sensitive     TRIMETH/SULFA >=320 RESISTANT Resistant     AMPICILLIN/SULBACTAM 8 SENSITIVE Sensitive     PIP/TAZO <=4 SENSITIVE Sensitive     * FEW KLEBSIELLA PNEUMONIAE   Methicillin resistant staphylococcus aureus - MIC*    CIPROFLOXACIN >=8 RESISTANT Resistant     ERYTHROMYCIN >=8 RESISTANT Resistant     GENTAMICIN <=0.5 SENSITIVE Sensitive     OXACILLIN >=4 RESISTANT Resistant     TETRACYCLINE <=1 SENSITIVE Sensitive     VANCOMYCIN 1 SENSITIVE Sensitive     TRIMETH/SULFA <=10 SENSITIVE Sensitive     CLINDAMYCIN <=0.25 SENSITIVE Sensitive     RIFAMPIN <=0.5 SENSITIVE Sensitive     Inducible Clindamycin NEGATIVE Sensitive     * FEW METHICILLIN RESISTANT STAPHYLOCOCCUS AUREUS    Radiology Studies: No results found.  Scheduled Meds:  acetaminophen  1,000 mg Oral TID   amLODipine  10 mg Oral QHS    aspirin EC  81 mg Oral QHS   feeding supplement  237 mL Oral BID BM   linezolid  600 mg Oral Q12H   memantine  10 mg Oral BID   potassium chloride  40 mEq Oral BID   Continuous Infusions:  sodium chloride Stopped (08/06/21 1249)   meropenem (MERREM) IV 1 g (08/07/21 1854)    LOS: 8 days   Raiford Noble, DO [facility] Available via Epic secure chat 7am-7pm After these hours, please refer to coverage provider listed on [email or web address] 08/07/2021, 7:28 PM

## 2021-08-07 NOTE — TOC Progression Note (Addendum)
Transition of Care (TOC) - Progression Note    Patient Details  Name: Andre Casey. MRN: [id number] Date of Birth: 04-02-1929  Transition of Care [facility]) CM/SW Contact  Domnick Chervenak, Juliann Pulse, RN Phone Number: 08/07/2021, 10:18 AM  Clinical Narrative: per attending-not medically stable.[facility] rep Tressa Busman has auth, & bed available.     Expected Discharge Plan: [place] Barriers to Discharge: Continued Medical Work up, Ship broker  Expected Discharge Plan and Services Expected Discharge Plan: [place] Choice: [place] arrangements for the past 2 months: Single Family Home                                       Social Determinants of Health (SDOH) Interventions    Readmission Risk Interventions     No data to display

## 2021-08-08 DIAGNOSIS — D638 Anemia in other chronic diseases classified elsewhere: Secondary | ICD-10-CM | POA: Diagnosis not present

## 2021-08-08 DIAGNOSIS — A419 Sepsis, unspecified organism: Secondary | ICD-10-CM | POA: Diagnosis not present

## 2021-08-08 DIAGNOSIS — N390 Urinary tract infection, site not specified: Secondary | ICD-10-CM | POA: Diagnosis not present

## 2021-08-08 DIAGNOSIS — N179 Acute kidney failure, unspecified: Secondary | ICD-10-CM | POA: Diagnosis not present

## 2021-08-08 LAB — CBC WITH DIFFERENTIAL/PLATELET
Abs Immature Granulocytes: 0.11 10*3/uL — ABNORMAL HIGH (ref 0.00–0.07)
Basophils Absolute: 0 10*3/uL (ref 0.0–0.1)
Basophils Relative: 0 %
Eosinophils Absolute: 0.5 10*3/uL (ref 0.0–0.5)
Eosinophils Relative: 6 %
HCT: 30 % — ABNORMAL LOW (ref 39.0–52.0)
Hemoglobin: 9.5 g/dL — ABNORMAL LOW (ref 13.0–17.0)
Immature Granulocytes: 1 %
Lymphocytes Relative: 17 %
Lymphs Abs: 1.4 10*3/uL (ref 0.7–4.0)
MCH: 26.8 pg (ref 26.0–34.0)
MCHC: 31.7 g/dL (ref 30.0–36.0)
MCV: 84.7 fL (ref 80.0–100.0)
Monocytes Absolute: 0.9 10*3/uL (ref 0.1–1.0)
Monocytes Relative: 10 %
Neutro Abs: 5.5 10*3/uL (ref 1.7–7.7)
Neutrophils Relative %: 66 %
Platelets: 492 10*3/uL — ABNORMAL HIGH (ref 150–400)
RBC: 3.54 MIL/uL — ABNORMAL LOW (ref 4.22–5.81)
RDW: 14.5 % (ref 11.5–15.5)
WBC: 8.3 10*3/uL (ref 4.0–10.5)
nRBC: 0 % (ref 0.0–0.2)

## 2021-08-08 LAB — COMPREHENSIVE METABOLIC PANEL
ALT: 18 U/L (ref 0–44)
AST: 23 U/L (ref 15–41)
Albumin: 2.6 g/dL — ABNORMAL LOW (ref 3.5–5.0)
Alkaline Phosphatase: 67 U/L (ref 38–126)
Anion gap: 7 (ref 5–15)
BUN: 18 mg/dL (ref 8–23)
CO2: 23 mmol/L (ref 22–32)
Calcium: 8.5 mg/dL — ABNORMAL LOW (ref 8.9–10.3)
Chloride: 109 mmol/L (ref 98–111)
Creatinine, Ser: 0.8 mg/dL (ref 0.61–1.24)
GFR, Estimated: >60 mL/min (ref >60)
Glucose, Bld: 114 mg/dL — ABNORMAL HIGH (ref 70–99)
Potassium: 3.8 mmol/L (ref 3.5–5.1)
Sodium: 139 mmol/L (ref 135–145)
Total Bilirubin: 0.5 mg/dL (ref 0.3–1.2)
Total Protein: 6.1 g/dL — ABNORMAL LOW (ref 6.5–8.1)

## 2021-08-08 LAB — MAGNESIUM: Magnesium: 2.2 mg/dL (ref 1.7–2.4)

## 2021-08-08 LAB — PHOSPHORUS: Phosphorus: 2.3 mg/dL — ABNORMAL LOW (ref 2.5–4.6)

## 2021-08-08 LAB — GLUCOSE, CAPILLARY: Glucose-Capillary: 121 mg/dL — ABNORMAL HIGH (ref 70–99)

## 2021-08-08 MED ORDER — ERTAPENEM IV (FOR PTA / DISCHARGE USE ONLY): 1.0000 g | INTRAVENOUS | 0 refills | Status: AC

## 2021-08-08 MED ORDER — AMLODIPINE BESYLATE 10 MG PO TABS: 10.0000 mg | ORAL_TABLET | Freq: Every day | ORAL | 0 refills | Status: DC

## 2021-08-08 MED ORDER — ACETAMINOPHEN 500 MG PO TABS: 1000.0000 mg | ORAL_TABLET | Freq: Three times a day (TID) | ORAL | 0 refills | Status: DC

## 2021-08-08 MED ORDER — K PHOS MONO-SOD PHOS DI & MONO 155-852-130 MG PO TABS
500.0000 mg | ORAL_TABLET | Freq: Two times a day (BID) | ORAL | Status: DC
  Administered 2021-08-08: 500 mg via ORAL
  Filled 2021-08-08 (×2): qty 2

## 2021-08-08 MED ORDER — DICLOFENAC SODIUM 1 % EX GEL: 2.0000 g | Freq: Two times a day (BID) | CUTANEOUS | 0 refills | Status: DC | PRN

## 2021-08-08 MED ORDER — ENSURE ENLIVE PO LIQD: 237.0000 mL | Freq: Two times a day (BID) | ORAL | 12 refills | Status: DC

## 2021-08-08 MED ORDER — LINEZOLID 600 MG PO TABS: 600.0000 mg | ORAL_TABLET | Freq: Two times a day (BID) | ORAL | 0 refills | Status: AC

## 2021-08-08 NOTE — TOC Transition Note (Addendum)
Transition of Care [facility]) - CM/SW Discharge Note   Patient Details  Name: Andre Casey. MRN: [id number] Date of Birth: March 13, 1929  Transition of Care [facility]) CM/SW Contact:  Lennart Pall, LCSW Phone Number: 08/08/2021, 4:00 PM   Clinical Narrative:    Pt medically cleared for dc today to [facility] SNF and ins auth received.  Pt and granddaughter aware and agreeable. PTAR called at 4:00pm.  RN to call report to [phone]. No further TOC needs.   Final next level of care: Skilled Nursing Facility Barriers to Discharge: Barriers Resolved   Patient Goals and CMS Choice Patient states their goals for this hospitalization and ongoing recovery are:: Rehab CMS Medicare.gov Compare Post Acute Care list provided to:: Patient Represenative (must comment)    Discharge Placement              Patient chooses bed at: [facility] Patient to be transferred to facility by: [place] Name of family member notified: granddaughter, [name] Patient and family notified of of transfer: 08/08/21  Discharge Plan and Services     Post Acute Care Choice: [place]                               Social Determinants of Health (SDOH) Interventions     Readmission Risk Interventions     No data to display

## 2021-08-08 NOTE — Progress Notes (Signed)
PHARMACY CONSULT NOTE FOR:  OUTPATIENT  PARENTERAL ANTIBIOTIC THERAPY (OPAT)  Indication: complicated UTI Regimen: ertapenem 1 gr IV q24h  End date: 6/22  IV antibiotic discharge orders are pended. To discharging provider:  please sign these orders via discharge navigator,  Select New Orders & click on the button choice - Manage This Unsigned Work.     Thank you for allowing pharmacy to be a part of this patient's care.    Royetta Asal, PharmD, BCPS 08/08/2021 11:33 AM

## 2021-08-08 NOTE — Discharge Summary (Signed)
Physician Discharge Summary   Patient: Andre Casey. MRN: [id number] DOB: 1929-11-20  Admit date:     07/30/2021  Discharge date: 08/08/21  Discharge Physician: Raiford Noble, DO   PCP: [name], MD   Recommendations at discharge:    Follow Up with PCP within 1-2 weeks and repeat CBC, CMP, Mag, Phos within 1 week Follow up with Urology within 1-2 weeks and have Dr. Amalia Hailey follow up for consideration of Cunningham clamp Follow up with Palliative Care in the outpatient setting   Discharge Diagnoses: Principal Problem:   Severe sepsis [facility]) Active Problems:   Essential hypertension   Hypokalemia   Acute lower UTI   AKI (acute kidney injury) ([place])   Anemia of chronic disease  Resolved Problems:   * No resolved hospital problems. [facility] Course: The patient is an 88 y.o. WM PMHx significant for but not limited to prostate cancer status post radiation and Lupron injections, essential HTN, CAD s/p PCI with stent placement x2 in November 2017, anemia of chronic disease associated baseline globin 9-12, Renal mass and other comorbidites.   The patient was Admitted to [facility] on 07/30/2021 with severe sepsis due to urinary tract infection after presenting from home to [facility] ED complaining of fever.    The patient, lives at home, reports 1 day of objective fever, noting temperature max of 103 at home over that timeframe.  He notes associated chills in the absence of full body rigors or generalized myalgias.   Denies any recent headache, neck stiffness, rhinitis, rhinorrhea, sore throat, sob, wheezing, cough, nausea, vomiting, abdominal pain, diarrhea, or rash. No recent traveling or known COVID-19 exposures.  In the context of a history of prostate cancer he underwent suprapubic catheter placement on 05/16/2021.  Patient remains worsening cloudy appearance of associated urine over the last few days.   Denies any recent chest pain, diaphoresis, or palpitations.  No  recent lower extremity erythema, calf tenderness, or worsening peripheral edema.   Of note, the patient reportedly recently had an superficial abscess surrounding his suprapubic catheter, for which she underwent IR aspiration on 07/05/2021.  **Interim History Patient was admitted for severe sepsis and complicated UTI with positive Klebsiella pneumoniae which is ESBL and MRSA.  Patient was placed on antibiotics and his vancomycin was changed to Zyvox and meropenem and will continue treatment until 6/22.  WBC started worsening and he did spike another temperature so he may need to repeat blood cultures but improved and now stable for D/C.  PT OT recommending SNF and he is Medically stable to D/C to SNF with outpatient Urological Follow up.     Assessment and Plan: Severe sepsis/Complicated UTI/positive Klebsiella pneumonia (ESBL)/MRSA -Continue current antibiotic x14 days - 6/7 patient unsure of last time suprapubic catheter changed.  Research EMR, appears quite old. -LR 139m/hr -6/8 DC LR,---> normal saline 1069mhr -6/12 appears last time patient's suprapubic tube change was by Dr. EvAmalia Haileyn 05/16/2021.  Per office visit to Dr. RoTresa Endorologist on 07/18/2021 patient was supposed to have monthly tube changes.  ([facility]12 in [place] consult local urology to see if they will change out suprapubic catheter. -6/13 discussed case with pharmacy in order to prevent patient requiring additional mid line/PICC will change antibiotic course to the following;             Zyvox 600 mg BID x 5 days and will finish 6/18  Meropenem will be completed 6/222 - Patient's WBC is worsening and he had a temperature today so we need to continue to monitor carefully. -6/13 for RN to change suprapubic cath prior to discharge, and this was done on 08/06/2021 -If patient continues to spike temperatures and WBC continues worsen need to repeat cultures and consult urology  versus ID -WBC is worsening and went from 5.1 -> 6.6 -> 11.9 -> 12.2 -> 8.3  -Urology evaluated and feel Suprapubic Cath is draining well. Patient is having some incontinence from Ureteral Stricture and can follow up as an outpatient within 1-2 weeks with Primary Urology team at [facility]    Hypokalemia -Potassium was 3.0 and improved to 3.8 -Continue to monitor replete as necessary -Repeat CMP in the a.m.  Hypophosphatemia -Patient's Phos Level was 2.3 -Replete with po K Phos Neutral 500 mg BID x2 -Continue to Monitor and Replete as Necessary -Repeat Phos Level within 1 weke    AKI (baseline Cr 0.97 on 5/17) -AKI is improved and his BUNs/creatinine is now 18/0.78 -> 18/0.80 -Avoid further nephrotoxic medications, contrast dyes, hypotension and dehydration and renally dose medications -Repeat CMP within 1 week    Essential HTN -6/11 increase Amlodipine 10 mg daily -Continue to monitor blood pressures per protocol -Last blood pressure reading was 128/79   Anemia of chronic disease (baseline HgB 9-12) -Stable -Patient's hemoglobin/hematocrit is now stable at 9.6/30.9 yesterday and now 9.5/30.0 -Check anemia panel within 1 week  -Continue to monitor for signs and symptoms of bleeding; no overt bleeding noted   Severe protein calorie malnutrition Hypoalbuminemia -6/13 supplements per dietitian -Nutrition Status: Nutrition Problem: Severe Malnutrition Etiology: chronic illness, cancer and cancer related treatments Signs/Symptoms: severe fat depletion, severe muscle depletion Interventions: Ensure Enlive (each supplement provides 350kcal and 20 grams of protein)   Thrombocytosis -Platelet Count went from 348 -> 379 -> 416 -> 473 -> 483 -> 486 -> 492 -Continue to Monitor and Trend -Repeat CBC within 1 week    Goals of care - 6/8 PT/OT consult:   Elderly patient with complicated UTI evaluate for CIR vs SNF.  Work with patient daily on strengthening and ROM: Recommend SNF -6/8 out  of bed to chair every shift -6/10 TOC consult: SNF:Bed offers to be given to grand dtr-[name]-attempted to email -undeliverable;left vm for call back to inform of bed offers prior auth.  -1:18p-Grand dtr [name] chose [employer] aware to start auth-await auth. -6/13 TOC assisting with placement and medically stable to Follow up   Nutrition Documentation    [facility] to Hosp-Admission (Current) from 07/30/2021 in [facility]  Nutrition Problem Severe Malnutrition  Etiology chronic illness, cancer and cancer related treatments  Nutrition Goal Patient will meet greater than or equal to 90% of their needs  Interventions Ensure Enlive (each supplement provides 350kcal and 20 grams of protein)      Consultants: Urology   Procedures performed: Suprapubic Cath change   Disposition: Skilled nursing facility Diet recommendation:  Regular diet DISCHARGE MEDICATION: Allergies as of 08/08/2021       Reactions   Aricept [donepezil] Nausea Only   Report upset stomach - unable to tolerate.   Namzaric [memantine Hcl-donepezil Hcl] Other (See Comments)   Stomach upset  Pt able to take Memantine by itself.        Medication List     STOP taking these medications    traMADol 50 MG tablet Commonly known as: ULTRAM       TAKE  these medications    acetaminophen 500 MG tablet Commonly known as: TYLENOL Take 2 tablets (1,000 mg total) by mouth 3 (three) times daily. What changed: when to take this   amLODipine 10 MG tablet Commonly known as: NORVASC Take 1 tablet (10 mg total) by mouth at bedtime. What changed:  medication strength how much to take   aspirin EC 81 MG tablet Take 1 tablet (81 mg total) by mouth daily. Swallow whole. What changed:  when to take this additional instructions   diclofenac Sodium 1 % Gel Commonly known as: VOLTAREN Apply 2 g topically 2 (two) times daily as needed (for knee pain).   ertapenem  IVPB Commonly known  as: INVANZ Inject 1 g into the vein daily for 7 days. Indication:  complicated UTI First Dose: Yes Last Day of Therapy:  08/15/21 Labs - Once weekly:  CBC/D and BMP, Labs - Every other week:  ESR and CRP Method of administration: Mini-Bag Plus / Gravity Method of administration may be changed at the discretion of home infusion pharmacist based upon assessment of the patient and/or caregiver's ability to self-administer the medication ordered.   feeding supplement Liqd Take 237 mLs by mouth 2 (two) times daily between meals.   linezolid 600 MG tablet Commonly known as: ZYVOX Take 1 tablet (600 mg total) by mouth every 12 (twelve) hours for 5 days.   MAGNESIUM PO Take 1 tablet by mouth every Monday, Wednesday, and Friday. Take one tablet by mouth every Monday, Wednesday, Friday night   Melatonin 3 MG Caps Take 1 capsule by mouth at bedtime.   memantine 10 MG tablet Commonly known as: Namenda Take 1 tablet (10 mg total) by mouth 2 (two) times daily.   mirtazapine 7.5 MG tablet Commonly known as: REMERON Take 7.5 mg by mouth at bedtime.   multivitamin with minerals Tabs tablet Take 1 tablet by mouth daily.   polyethylene glycol 17 g packet Commonly known as: MIRALAX / GLYCOLAX Take 17 g by mouth daily.   PreserVision AREDS 2 Caps Take 1 capsule by mouth 2 (two) times daily.   PROBIOTIC PO Take 1 tablet by mouth daily with lunch.   senna-docusate 8.6-50 MG tablet Commonly known as: Senokot-S Take 1 tablet by mouth at bedtime as needed for mild constipation.   vitamin B-12 100 MCG tablet Commonly known as: CYANOCOBALAMIN Take 100 mcg by mouth daily.   VITAMIN C PO Take 1 tablet by mouth every morning.               Discharge Care Instructions  (From admission, onward)           Start     Ordered   08/08/21 0000  Change dressing on IV access line weekly and PRN  (Home infusion instructions - [facility] )        08/08/21 1330             Contact information for after-discharge care     Destination     [facility] .   Service: Skilled Nursing Contact information: [place] [phone]                    Discharge Exam: [place] Weights   08/02/21 0443 08/03/21 0556 08/05/21 0500  Weight: 61 kg 62.2 kg 58.2 kg   Vitals:   08/08/21 0958 08/08/21 1404  BP: 134/80 128/79  Pulse: 81 77  Resp: 18 18  Temp: 98.4 F (36.9  C) 98.6 F (37 C)  SpO2: 99% 100%   Examination: Physical Exam:  Constitutional: Thin elderly demented Caucasian male in no acute distress appears calm but is upset that he is having some leakage Respiratory: Diminished to auscultation bilaterally, no wheezing, rales, rhonchi or crackles. Normal respiratory effort and patient is not tachypenic. No accessory muscle use.  Unlabored breathing Cardiovascular: RRR, no murmurs / rubs / gallops. S1 and S2 auscultated. No extremity edema.  Abdomen: Soft, non-tender, non-distended. Bowel sounds positive.  GU: Deferred.  Suprapubic tube in place has a condom cath on Musculoskeletal: No clubbing / cyanosis of digits/nails. No joint deformity upper and lower extremities.  Skin: No rashes, lesions, ulcers on limited skin evaluation. No induration; Warm and dry.  Neurologic: CN 2-12 grossly intact with no focal deficits.  Romberg sign and cerebellar reflexes not assessed.  Psychiatric: Normal judgment and insight. Alert and oriented x 3. Normal mood and appropriate affect.   Condition at discharge: stable  The results of significant diagnostics from this hospitalization (including imaging, microbiology, ancillary and laboratory) are listed below for reference.   Imaging Studies: CT ABDOMEN PELVIS WO CONTRAST  Result Date: 07/30/2021 CLINICAL DATA:  Abdominal pain, acute, nonlocalized pus drainage from supra pubic cath site EXAM: CT ABDOMEN AND PELVIS WITHOUT CONTRAST TECHNIQUE: Multidetector CT imaging  of the abdomen and pelvis was performed following the standard protocol without IV contrast. RADIATION DOSE REDUCTION: This exam was performed according to the departmental dose-optimization program which includes automated exposure control, adjustment of the mA and/or kV according to patient size and/or use of iterative reconstruction technique. COMPARISON:  CT abdomen pelvis 07/03/2021 FINDINGS: Lower chest: No acute abnormality. Hepatobiliary: No focal liver abnormality. No gallstones, gallbladder wall thickening, or pericholecystic fluid. No biliary dilatation. Pancreas: Difficult to visualize possibly increased in size 1.6 cm (from 1.3 cm) pancreatic head lesion (2:24). Normal pancreatic contour. No surrounding inflammatory changes. No main pancreatic ductal dilatation. Spleen: Normal in size without focal abnormality. Adrenals/Urinary Tract: No adrenal nodule bilaterally. No nephrolithiasis and no hydronephrosis. Interval increase in size of a right renal 3 x 2 cm mass. No ureterolithiasis or hydroureter. Urinary bladder is decompressed with suprapubic catheter the noted to terminate within the urinary bladder wall lumen. Stomach/Bowel: Stomach is within normal limits. No evidence of bowel wall thickening or dilatation. Diffuse sigmoid diverticulosis. Appendix appears normal. Vascular/Lymphatic: No abdominal aorta or iliac aneurysm. Mild atherosclerotic plaque of the aorta and its branches. Status post pelvic lymph node dissection. Slightly limited evaluation of the pelvis due to streak artifact. Decreased in size 0.9 cm left periaortic lymph node (2:31). Stable minimally enlarged left inguinal lymph node (1.6 cm.) Reproductive: Status post prostatectomy Other: No intraperitoneal free fluid. No intraperitoneal free gas. No organized fluid collection. Musculoskeletal: No abdominal wall hernia or abnormality. Similar-appearing L4 sclerotic lesion. No acute displaced fracture. Multilevel degenerative changes of  the spine. Grade 1 anterolisthesis of L5 on S1. Mild retrolisthesis of L2 on L3 and L3 on L4. IMPRESSION: 1. Decompressed urinary bladder in the setting of a suprapubic catheter. Markedly limited evaluation on this noncontrast study. 2. Interval increase in size of a right renal 3 x 2 cm mass. Finding concerning for renal cell carcinoma. Recommend MRI renal protocol for further evaluation. 3. Difficult to visualize possibly increased in size 1.6 cm (from 1.3 cm) pancreatic head lesion. This can further be evaluated on MRI. 4. Decreased in size 0.9 cm left periaortic lymph node. Stable minimally enlarged left inguinal lymph node. 5. Similar-appearing L4 sclerotic  lesion. 6. Status post prostatectomy and pelvic lymph node dissection. 7. Diffuse sigmoid diverticulosis with no findings of acute diverticulitis. Electronically Signed   By: Iven Finn M.D.   On: 07/30/2021 20:24   DG Chest Port 1 View  Result Date: 07/30/2021 CLINICAL DATA:  UTI no EXAM: PORTABLE CHEST 1 VIEW COMPARISON:  None Available. FINDINGS: The heart size and mediastinal contours are within normal limits. Both lungs are clear. No pleural effusion or pneumothorax. Partially imaged right shoulder reverse arthroplasty. IMPRESSION: No acute process in the chest. Electronically Signed   By: Macy Mis M.D.   On: 07/30/2021 17:36    Microbiology: Results for orders placed or performed during the hospital encounter of 07/30/21  Culture, blood (x 2)     Status: None   Collection Time: 07/30/21  4:45 PM   Specimen: BLOOD  Result Value Ref Range Status   Specimen Description   Final    BLOOD RIGHT ANTECUBITAL Performed at [facility] [address], [place], [place] [address]    Special Requests   Final    BOTTLES DRAWN AEROBIC AND ANAEROBIC Blood Culture results may not be optimal due to an excessive volume of blood received in culture bottles Performed at [facility] [address],  [place], [place] [address]    Culture   Final    NO GROWTH 5 DAYS Performed at [facility], [place] [address], [place], [place] [address]    Report Status 08/04/2021 FINAL  Final  Culture, blood (x 2)     Status: None   Collection Time: 07/30/21  5:00 PM   Specimen: BLOOD RIGHT ARM  Result Value Ref Range Status   Specimen Description   Final    BLOOD RIGHT ARM Performed at [facility] [address], [place], [place] [address]    Special Requests   Final    BOTTLES DRAWN AEROBIC AND ANAEROBIC Blood Culture results may not be optimal due to an excessive volume of blood received in culture bottles Performed at [facility] [address], [place], [place] [address]    Culture   Final    NO GROWTH 5 DAYS Performed at [facility], [place] [address], [place], [place] [address]    Report Status 08/04/2021 FINAL  Final  Urine Culture     Status: Abnormal   Collection Time: 07/30/21  6:58 PM   Specimen: Urine, Catheterized  Result Value Ref Range Status   Specimen Description   Final    URINE, CATHETERIZED Performed at [facility] [address] [place], [place] [address]    Special Requests   Final    NONE Performed at [facility], [place] [address], [place], [place] [address]    Culture (A)  Final    >=100,000 COLONIES/mL KLEBSIELLA PNEUMONIAE Confirmed Extended Spectrum Beta-Lactamase Producer (ESBL).  In bloodstream infections from ESBL organisms, carbapenems are preferred over piperacillin/tazobactam. They are shown to have a lower risk of mortality.    Report Status 08/01/2021 FINAL  Final   Organism ID, Bacteria KLEBSIELLA PNEUMONIAE (A)  Final      Susceptibility   Klebsiella pneumoniae - MIC*    AMPICILLIN >=32 RESISTANT Resistant     CEFAZOLIN >=64 RESISTANT Resistant     CEFEPIME 0.5 SENSITIVE Sensitive     CEFTRIAXONE 8 RESISTANT Resistant     CIPROFLOXACIN 1 RESISTANT Resistant      GENTAMICIN >=16 RESISTANT Resistant  IMIPENEM <=0.25 SENSITIVE Sensitive     NITROFURANTOIN 64 INTERMEDIATE Intermediate     TRIMETH/SULFA >=320 RESISTANT Resistant     AMPICILLIN/SULBACTAM 8 SENSITIVE Sensitive     PIP/TAZO <=4 SENSITIVE Sensitive     * >=100,000 COLONIES/mL KLEBSIELLA PNEUMONIAE  Aerobic Culture w Gram Stain (superficial specimen)     Status: None   Collection Time: 07/30/21  7:56 PM   Specimen: Skin, Cyst/Tag/Debridement  Result Value Ref Range Status   Specimen Description   Final    SKIN ABDOMINAL WALL SUPRAPUBIC CATHETER INSERTION SITE Performed at [facility] [address], [place], [place] [address]    Special Requests   Final    NONE Performed at [facility], [place] [address], [place], [place] [address]    Gram Stain   Final    NO SQUAMOUS EPITHELIAL CELLS SEEN FEW GRAM POSITIVE RODS FEW GRAM NEGATIVE RODS FEW GRAM POSITIVE COCCI NO WBC SEEN    Culture   Final    FEW KLEBSIELLA PNEUMONIAE Confirmed Extended Spectrum Beta-Lactamase Producer (ESBL).  In bloodstream infections from ESBL organisms, carbapenems are preferred over piperacillin/tazobactam. They are shown to have a lower risk of mortality. FEW METHICILLIN RESISTANT STAPHYLOCOCCUS AUREUS ABUNDANT CORYNEBACTERIUM STRIATUM Standardized susceptibility testing for this organism is not available. Performed at [facility], [place] [address] [place], [place] [address]    Report Status 08/05/2021 FINAL  Final   Organism ID, Bacteria KLEBSIELLA PNEUMONIAE  Final   Organism ID, Bacteria METHICILLIN RESISTANT STAPHYLOCOCCUS AUREUS  Final      Susceptibility   Klebsiella pneumoniae - MIC*    AMPICILLIN >=32 RESISTANT Resistant     CEFAZOLIN >=64 RESISTANT Resistant     CEFEPIME 0.5 SENSITIVE Sensitive     CEFTAZIDIME RESISTANT Resistant     CEFTRIAXONE 32 RESISTANT Resistant     CIPROFLOXACIN 1 RESISTANT Resistant     GENTAMICIN >=16 RESISTANT Resistant      IMIPENEM 0.5 SENSITIVE Sensitive     TRIMETH/SULFA >=320 RESISTANT Resistant     AMPICILLIN/SULBACTAM 8 SENSITIVE Sensitive     PIP/TAZO <=4 SENSITIVE Sensitive     * FEW KLEBSIELLA PNEUMONIAE   Methicillin resistant staphylococcus aureus - MIC*    CIPROFLOXACIN >=8 RESISTANT Resistant     ERYTHROMYCIN >=8 RESISTANT Resistant     GENTAMICIN <=0.5 SENSITIVE Sensitive     OXACILLIN >=4 RESISTANT Resistant     TETRACYCLINE <=1 SENSITIVE Sensitive     VANCOMYCIN 1 SENSITIVE Sensitive     TRIMETH/SULFA <=10 SENSITIVE Sensitive     CLINDAMYCIN <=0.25 SENSITIVE Sensitive     RIFAMPIN <=0.5 SENSITIVE Sensitive     Inducible Clindamycin NEGATIVE Sensitive     * FEW METHICILLIN RESISTANT STAPHYLOCOCCUS AUREUS    Labs: CBC: Recent Labs  Lab 08/04/21 0539 08/05/21 0358 08/06/21 0324 08/07/21 0422 08/08/21 0222  WBC 5.3 6.6 11.9* 12.2* 8.3  NEUTROABS 2.6 3.9 8.3* 8.6* 5.5  HGB 9.6* 9.8* 9.9* 9.6* 9.5*  HCT 31.1* 31.8* 31.6* 30.9* 30.0*  MCV 86.9 85.9 86.1 84.9 84.7  PLT 416* 473* 483* 486* 809*   Basic Metabolic Panel: Recent Labs  Lab 08/04/21 0539 08/05/21 0358 08/06/21 0324 08/07/21 0422 08/08/21 0222  NA 141 142 139 137 139  K 3.4* 3.6 3.5 3.0* 3.8  CL 110 113* 110 106 109  CO2 _0 GLUCOSE 101* 105* 112* 114* 114*  BUN _1 CREATININE 0.64 0.73 0.82 0.78 0.80  CALCIUM 8.6*  8.9 8.7* 8.7* 8.5*  MG 2.0 2.2 2.1 2.2 2.2  PHOS 2.4* 2.7 2.9 2.7 2.3*   Liver Function Tests: Recent Labs  Lab 08/04/21 0539 08/05/21 0358 08/06/21 0324 08/07/21 0422 08/08/21 0222  AST _0 ALT _1 ALKPHOS 49 54 71 71 67  BILITOT 0.3 0.5 0.4 0.5 0.5  PROT 5.8* 5.8* 6.1* 6.1* 6.1*  ALBUMIN 2.3* 2.4* 2.6* 2.6* 2.6*   CBG: Recent Labs  Lab 08/08/21 1201  GLUCAP 121*   Discharge time spent: greater than 30 minutes.  Signed: Raiford Noble, DO [facility] 08/08/2021
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
## 2021-08-08 NOTE — Progress Notes (Signed)
Physical Therapy Treatment Patient Details Name: Andre Casey. MRN: [id number] DOB: 18-Jun-1929 Today's Date: 08/08/2021   History of Present Illness Andre Casey. is a 88 y.o. male admitted with sepsis due to UTI.  Medical history significant of metastatic prostate cancer, right renal mass/pancreatic mass, physical deconditioning, urinary incontinence with indwelling suprapubic catheter, penile prosthesis implant with recent removal due to urinary retention on 05/16/21) CKD, Alzheimers, HTN, recent Covid 19 infection (04/15/21), normocytic anemia and CAD    PT Comments    Pt is progressing well with therapy. Pt ambulated 180 ft in hallway needing Min assist and completed 3 exercises from HEP. Currently pt requires Min guard for bed mobility and Min assist for transfers. Pt will benefit from skilled therapy. Recommending pt go to SNF upon DC from hospital. Will progress as able.   Recommendations for follow up therapy are one component of a multi-disciplinary discharge planning process, led by the attending physician.  Recommendations may be updated based on patient status, additional functional criteria and insurance authorization.  Follow Up Recommendations  Skilled nursing-short term rehab (<3 hours/day)     Assistance Recommended at Discharge Frequent or constant Supervision/Assistance  Patient can return home with the following A little help with walking and/or transfers;A little help with bathing/dressing/bathroom;Assistance with cooking/housework;Direct supervision/assist for medications management;Direct supervision/assist for financial management;Assist for transportation;Help with stairs or ramp for entrance   Equipment Recommendations  None recommended by PT    Recommendations for Other Services       Precautions / Restrictions Precautions Precautions: Fall Splint/Cast: Rt forearm brace Splint/Cast - Date Prophylactic Dressing Applied (if applicable):  10/08/46 Restrictions Weight Bearing Restrictions: No     Mobility  Bed Mobility Overal bed mobility: Needs Assistance Bed Mobility: Supine to Sit     Supine to sit: Min guard     General bed mobility comments: Pt needed VC's for hand and trunk placement to continue scooting to EOB until feet contacted the floor.    Transfers Overall transfer level: Needs assistance Equipment used: Rolling walker (2 wheels) Transfers: Sit to/from Stand Sit to Stand: Min assist           General transfer comment: Pt relied on min assist to complete first sit to stand only, remainder of transfers he required VC's for hand placement <>sit/stand.    Ambulation/Gait Ambulation/Gait assistance: Min guard Gait Distance (Feet): 180 Feet Assistive device: Rolling walker (2 wheels) Gait Pattern/deviations: Step-through pattern, Decreased stride length, Trunk flexed, Narrow base of support Gait velocity: decr     General Gait Details: Pt ambulated well down hallway, needed VC's "stand up straight" to decrease trunk flexion during gait.   Stairs             Wheelchair Mobility    Modified Rankin (Stroke Patients Only)       Balance Overall balance assessment: Needs assistance Sitting-balance support: No upper extremity supported Sitting balance-Leahy Scale: Good Sitting balance - Comments: Pt demonstrates steady balance in sitting to EOB. Postural control:  (Forward lean) Standing balance support: Bilateral upper extremity supported, During functional activity, Reliant on assistive device for balance Standing balance-Leahy Scale: Fair Standing balance comment: Pt did well maintaining standing balance several times throughout session, required RW with Bil UE support.                            Cognition Arousal/Alertness: Awake/alert Behavior During Therapy: WFL for tasks assessed/performed Overall Cognitive Status: Within  Functional Limits for tasks assessed                                  General Comments: Pt is pleasant and demonstrated positive attitude toward session, completed all tasks we asked.        Exercises General Exercises - Lower Extremity Long Arc Quad: 10 reps, Both, Seated Hip Flexion/Marching: AROM, Strengthening, Both, 10 reps, Seated Heel Raises: Both, AROM, 10 reps, Standing    General Comments        Pertinent Vitals/Pain Pain Assessment Pain Assessment: No/denies pain    Home Living                          Prior Function            PT Goals (current goals can now be found in the care plan section) Acute Rehab PT Goals Patient Stated Goal: Feel better PT Goal Formulation: With patient Time For Goal Achievement: 08/16/21 Potential to Achieve Goals: Good Progress towards PT goals: Progressing toward goals    Frequency    Min 2X/week      PT Plan Current plan remains appropriate    Co-evaluation              AM-PAC PT "6 Clicks" Mobility   Outcome Measure  Help needed turning from your back to your side while in a flat bed without using bedrails?: None Help needed moving from lying on your back to sitting on the side of a flat bed without using bedrails?: None Help needed moving to and from a bed to a chair (including a wheelchair)?: A Little Help needed standing up from a chair using your arms (e.g., wheelchair or bedside chair)?: A Little Help needed to walk in hospital room?: A Little Help needed climbing 3-5 steps with a railing? : A Little 6 Click Score: 20    End of Session Equipment Utilized During Treatment: Gait belt Activity Tolerance: Patient tolerated treatment well Patient left: in chair;with call bell/phone within reach;with chair alarm set Nurse Communication: Mobility status PT Visit Diagnosis: Unsteadiness on feet (R26.81);Other abnormalities of gait and mobility (R26.89)     Time: 8882-8003 PT Time Calculation (min) (ACUTE ONLY): 28  min  Charges:  $Gait Training: 8-22 mins $Therapeutic Exercise: 8-22 mins                     Margie Ege, SPT [place] 08/08/2021, 12:41 PM

## 2021-08-28 ENCOUNTER — Inpatient Hospital Stay: Payer: Medicare HMO

## 2021-08-28 ENCOUNTER — Inpatient Hospital Stay: Payer: Medicare HMO | Admitting: Oncology

## 2021-09-05 ENCOUNTER — Other Ambulatory Visit: Payer: Self-pay

## 2021-09-05 ENCOUNTER — Inpatient Hospital Stay: Payer: Medicare HMO | Attending: Oncology

## 2021-09-05 ENCOUNTER — Inpatient Hospital Stay (HOSPITAL_BASED_OUTPATIENT_CLINIC_OR_DEPARTMENT_OTHER): Payer: Medicare HMO | Admitting: Oncology

## 2021-09-05 VITALS — BP 141/73 | HR 79 | Temp 97.9°F | Resp 17 | Ht 67.0 in | Wt 118.3 lb

## 2021-09-05 DIAGNOSIS — R4189 Other symptoms and signs involving cognitive functions and awareness: Secondary | ICD-10-CM | POA: Diagnosis not present

## 2021-09-05 DIAGNOSIS — Z9079 Acquired absence of other genital organ(s): Secondary | ICD-10-CM | POA: Diagnosis not present

## 2021-09-05 DIAGNOSIS — C61 Malignant neoplasm of prostate: Secondary | ICD-10-CM

## 2021-09-05 DIAGNOSIS — R633 Feeding difficulties, unspecified: Secondary | ICD-10-CM | POA: Insufficient documentation

## 2021-09-05 DIAGNOSIS — Z888 Allergy status to other drugs, medicaments and biological substances status: Secondary | ICD-10-CM | POA: Insufficient documentation

## 2021-09-05 DIAGNOSIS — R54 Age-related physical debility: Secondary | ICD-10-CM | POA: Insufficient documentation

## 2021-09-05 DIAGNOSIS — Z79899 Other long term (current) drug therapy: Secondary | ICD-10-CM | POA: Diagnosis not present

## 2021-09-05 DIAGNOSIS — R591 Generalized enlarged lymph nodes: Secondary | ICD-10-CM | POA: Diagnosis not present

## 2021-09-05 LAB — CMP (CANCER CENTER ONLY)
ALT: 8 U/L (ref 0–44)
AST: 14 U/L — ABNORMAL LOW (ref 15–41)
Albumin: 3.6 g/dL (ref 3.5–5.0)
Alkaline Phosphatase: 103 U/L (ref 38–126)
Anion gap: 9 (ref 5–15)
BUN: 14 mg/dL (ref 8–23)
CO2: 27 mmol/L (ref 22–32)
Calcium: 9.4 mg/dL (ref 8.9–10.3)
Chloride: 104 mmol/L (ref 98–111)
Creatinine: 1.03 mg/dL (ref 0.61–1.24)
GFR, Estimated: >60 mL/min (ref >60)
Glucose, Bld: 119 mg/dL — ABNORMAL HIGH (ref 70–99)
Potassium: 3.8 mmol/L (ref 3.5–5.1)
Sodium: 140 mmol/L (ref 135–145)
Total Bilirubin: 0.4 mg/dL (ref 0.3–1.2)
Total Protein: 6.9 g/dL (ref 6.5–8.1)

## 2021-09-05 LAB — CBC WITH DIFFERENTIAL (CANCER CENTER ONLY)
Abs Immature Granulocytes: 0.1 10*3/uL — ABNORMAL HIGH (ref 0.00–0.07)
Basophils Absolute: 0.1 10*3/uL (ref 0.0–0.1)
Basophils Relative: 1 %
Eosinophils Absolute: 0.4 10*3/uL (ref 0.0–0.5)
Eosinophils Relative: 3 %
HCT: 32.9 % — ABNORMAL LOW (ref 39.0–52.0)
Hemoglobin: 10.4 g/dL — ABNORMAL LOW (ref 13.0–17.0)
Immature Granulocytes: 1 %
Lymphocytes Relative: 26 %
Lymphs Abs: 3.1 10*3/uL (ref 0.7–4.0)
MCH: 26.9 pg (ref 26.0–34.0)
MCHC: 31.6 g/dL (ref 30.0–36.0)
MCV: 85.2 fL (ref 80.0–100.0)
Monocytes Absolute: 1.2 10*3/uL — ABNORMAL HIGH (ref 0.1–1.0)
Monocytes Relative: 10 %
Neutro Abs: 7.3 10*3/uL (ref 1.7–7.7)
Neutrophils Relative %: 59 %
Platelet Count: 461 10*3/uL — ABNORMAL HIGH (ref 150–400)
RBC: 3.86 MIL/uL — ABNORMAL LOW (ref 4.22–5.81)
RDW: 17.1 % — ABNORMAL HIGH (ref 11.5–15.5)
WBC Count: 12.1 10*3/uL — ABNORMAL HIGH (ref 4.0–10.5)
nRBC: 0 % (ref 0.0–0.2)

## 2021-09-05 NOTE — Progress Notes (Signed)
Hematology and Oncology Follow Up Visit  Andre Casey [id number] 23-Mar-1929 88 y.o. 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  He presented with localized disease in 1994.   Prior Therapy: He is status post prostatectomy in 1994.  He developed relapsed disease starting in 2015 and his PSA was up to 182 January 2023.  Current therapy: Eligard 45 mg every 6 months started in February 2023.  He is currently receiving that under the care of Dr. Rosana Hoes.  Interim History: Andre Casey returns today for a follow-up.  Since the last visit, he has been hospitalized in June 2023 for sepsis and urinary retention.  He was discharged on August 08, 2021.  Since his discharge, he has reported any major complaints.  He continues to be rather frail without any falls or syncope.  He is ambulating with the help of a walker but requiring assistance for all of his ADLs.  He is having difficulty with continence as well as bathing and feeding himself.  He is fully dependent on his family members.  He denies any bone pain or pathological fractures.     Medications: I have reviewed the patient's current medications.  Current Outpatient Medications  Medication Sig Dispense Refill   acetaminophen (TYLENOL) 500 MG tablet Take 2 tablets (1,000 mg total) by mouth 3 (three) times daily. 30 tablet 0   amLODipine (NORVASC) 10 MG tablet Take 1 tablet (10 mg total) by mouth at bedtime. 30 tablet 0   Ascorbic Acid (VITAMIN C PO) Take 1 tablet by mouth every morning.     aspirin EC 81 MG tablet Take 1 tablet (81 mg total) by mouth daily. Swallow whole. (Patient taking differently: Take 81 mg by mouth at bedtime.) 90 tablet 3   diclofenac Sodium (VOLTAREN) 1 % GEL Apply 2 g topically 2 (two) times daily as needed (for knee pain). 50 g 0   feeding supplement  (ENSURE ENLIVE / ENSURE PLUS) LIQD Take 237 mLs by mouth 2 (two) times daily between meals. 237 mL 12   MAGNESIUM PO Take 1 tablet by mouth every Monday, Wednesday, and Friday. Take one tablet by mouth every Monday, Wednesday, Friday night     Melatonin 3 MG CAPS Take 1 capsule by mouth at bedtime.     memantine (NAMENDA) 10 MG tablet Take 1 tablet (10 mg total) by mouth 2 (two) times daily. 180 tablet 4   mirtazapine (REMERON) 7.5 MG tablet Take 7.5 mg by mouth at bedtime.     Multiple Vitamin (MULTIVITAMIN WITH MINERALS) TABS tablet Take 1 tablet by mouth daily.     Multiple Vitamins-Minerals (PRESERVISION AREDS 2) CAPS Take 1 capsule by mouth 2 (two) times daily.     polyethylene glycol (MIRALAX / GLYCOLAX) 17 g packet Take 17 g by mouth daily. 14 each 0   Probiotic Product (PROBIOTIC PO) Take 1 tablet by mouth daily with lunch.     senna-docusate (SENOKOT-S) 8.6-50 MG tablet Take 1 tablet by mouth at bedtime as needed for mild constipation.     vitamin B-12 (CYANOCOBALAMIN) 100 MCG tablet Take 100 mcg by mouth daily.     No current facility-administered medications for this visit.     Allergies:  Allergies  Allergen Reactions   Aricept [Donepezil] Nausea Only    Report upset stomach - unable to tolerate.   Namzaric [Memantine Hcl-Donepezil Hcl] Other (See  Comments)    Stomach upset  Pt able to take Memantine by itself.    Past Medical History, Surgical history, Social history, and Family History were reviewed and updated.    Physical Exam: Blood pressure (!) 141/73, pulse 79, temperature 97.9 F (36.6 C), temperature source Temporal, resp. rate 17, height '5\' 7"'$  (1.702 m), weight 118 lb 4.8 oz (53.7 kg), SpO2 100 %.  ECOG: 2    General appearance: Comfortable appearing without any discomfort Head: Normocephalic without any trauma Oropharynx: Mucous membranes are moist and pink without any thrush or ulcers. Eyes: Pupils are equal and round reactive to light. Lymph nodes:  No cervical, supraclavicular, inguinal or axillary lymphadenopathy.   Heart:regular rate and rhythm.  S1 and S2 without leg edema. Lung: Clear without any rhonchi or wheezes.  No dullness to percussion. Abdomin: Soft, nontender, nondistended with good bowel sounds.  No hepatosplenomegaly. Musculoskeletal: No joint deformity or effusion.  Full range of motion noted. Neurological: No deficits noted on motor, sensory and deep tendon reflex exam. Skin: No petechial rash or dryness.  Appeared moist.      Lab Results: Lab Results  Component Value Date   WBC 12.1 (H) 09/05/2021   HGB 10.4 (L) 09/05/2021   HCT 32.9 (L) 09/05/2021   MCV 85.2 09/05/2021   PLT 461 (H) 09/05/2021     Chemistry      Component Value Date/Time   NA 139 08/08/2021 0222   NA 139 04/14/2017 1040   K 3.8 08/08/2021 0222   CL 109 08/08/2021 0222   CO2 23 08/08/2021 0222   BUN 18 08/08/2021 0222   BUN 20 04/14/2017 1040   CREATININE 0.80 08/08/2021 0222   CREATININE 1.21 (H) 07/01/2016 0942      Component Value Date/Time   CALCIUM 8.5 (L) 08/08/2021 0222   ALKPHOS 67 08/08/2021 0222   AST 23 08/08/2021 0222   ALT 18 08/08/2021 0222   BILITOT 0.5 08/08/2021 0222   BILITOT 0.8 04/14/2017 1040          Impression and Plan:  88 year old with:  1.  Advanced prostate cancer with lymphadenopathy diagnosed in 2023.  He has castration-sensitive disease with PSA 123.   The natural course of this disease was reviewed today with the patient and his family accompanying him.  He is currently receiving androgen deprivation therapy and he would not be a candidate for any additional treatments.  He is overall frail status as well as cognitive decline is likely prohibitive for any additional treatment if he has disease progression in the future that hospice would be his best option.   2.  Androgen deprivation therapy: This will be continued under the care of Dr. Rosana Hoes.   3.  Prognosis and goals of care: This was  discussed again in detail with the patient and his family.  Prognosis is overall poor given his age, comorbidities as well as cognitive decline.  He would benefit from palliative care involvement in attentionally hospice in the future.   4.  Follow-up: Will be as needed in the future.     30  minutes were spent on this encounter.  The time was dedicated to reviewing laboratory data, disease status update, treatment choices as well as discussing prognosis.        Zola Button, MD 7/13/20231:22 PM

## 2021-09-06 LAB — PROSTATE-SPECIFIC AG, SERUM (LABCORP): Prostate Specific Ag, Serum: 26.1 ng/mL — ABNORMAL HIGH (ref 0.0–4.0)

## 2021-09-18 ENCOUNTER — Telehealth: Payer: Self-pay

## 2021-09-18 NOTE — Telephone Encounter (Signed)
Attempted to contact patient's granddaughter [name] to schedule a Palliative Care consult appointment. No answer left a message to return call.

## 2021-09-26 ENCOUNTER — Telehealth: Payer: Medicare HMO | Admitting: Family Medicine

## 2021-09-26 ENCOUNTER — Telehealth: Payer: Self-pay

## 2021-09-26 ENCOUNTER — Encounter: Payer: Self-pay | Admitting: Family Medicine

## 2021-09-26 DIAGNOSIS — Z515 Encounter for palliative care: Secondary | ICD-10-CM

## 2021-09-26 DIAGNOSIS — E43 Unspecified severe protein-calorie malnutrition: Secondary | ICD-10-CM

## 2021-09-26 NOTE — Telephone Encounter (Signed)
Spoke with patient's granddaughter Asencion Partridge and scheduled a Mychart Palliative Consult for 09/26/21 @ 11 AM.   Consent obtained; updated Netsmart, Team List and Epic.

## 2021-09-26 NOTE — Progress Notes (Signed)
[facility] Consult Note Telephone: [phone]  Fax: [phone]    Date of encounter: 09/26/21 11:15 AM PATIENT NAME: Andre Casey [address] [place] [address]   [phone] (home) [phone] (work) DOB: Mar 18, 1929 MRN: [id number] PRIMARY CARE PROVIDER:    [name], MD,  [address] [place] [address] [phone]  REFERRING PROVIDER:   [name], MD [address] [place],  [place] [address] [phone]  RESPONSIBLE PARTY:    I connected with  [name]. And his granddaughter Asencion Partridge on 09/26/21 by a video enabled telemedicine application and verified that I am speaking with the correct person using two identifiers.   I discussed the limitations of evaluation and management by telemedicine. The patient expressed understanding and agreed to proceed.    Palliative Care was asked to follow this patient by consultation request of  [name], MD to address advance care planning and complex medical decision making. This is a follow up visit  ASSESSMENT, SYMPTOM MANAGEMENT AND PLAN / RECOMMENDATIONS:   Palliative Care Encounter Dementia Fast 7 score 6e complicated by prostate cancer with renal/pancreatic masses under surveillance by Oncology Possible intentional vs unintentional self neglect with recent loss of spouse in the last year. Granddaughter checking into long term care policy to help keep pt at home. May benefit from SW referral with grief counseling but will monitor for now and address at next visit.  2.  Protein Calorie Malnutrition/IDA Encouraged granddaughter to get MVI with iron to supplement daily until follow up per PCP and follow PCP recommendations for further iron supplementation (educated iron available over the counter). Encouraged to resume Ensure supplements to prevent further weight loss (17 lbs/4 months). Albumin  improved from 2.6 in June to 3.6 in July 2023.     Advance Care Planning/Goals of Care: Goals include to maximize quality of life and symptom management. Has a living will that designates [facility] POA  Identification of a healthcare agent-1st Curvin Hunger followed by Shona Simpson and if both preceding unavailable-Suzanne Troxler Review of an advance directive document-living will specifies if he has an incurable condition that will be shortly terminal, he becomes unconscious and doctors determine with a high degree of certainty that he will not regain consciousness or if he suffers from advanced dementia and doctors determine it is not likely to get better with a high degree of certainty that his life not be prolonged. He wants to be kept comfortable and that the document be used if need to withhold or withdraw life prolonging measures in the circumstances outlined above. Decision not to resuscitate or to de-escalate disease focused treatments due to poor prognosis.-Listed as a DNR but copy of paperwork unavailable CODE STATUS: DNR    Follow up Palliative Care Visit: Palliative care will continue to follow for complex medical decision making, advance care planning, and clarification of goals. Return 3-4 weeks or prn.   This visit was coded based on medical decision making (MDM).  PPS: 60%  HOSPICE ELIGIBILITY/DIAGNOSIS: TBD  Chief Complaint:  Palliative Care is following up for chronic medical management in setting of dementia for patient with prostate cancer.  HISTORY OF PRESENT ILLNESS:  Andre Casey. is a 88 y.o. year old male with hx of dementia, prostate cancer,  CAD, HTN, urethral stricture with permanent SP cath placement and hx of pancreatic and right renal masses.  He c/o painful lump in my neck, rate a  3/10 with movement otherwise no pain.  Numbness in fingertips on both hands because of the area in his neck.  Had a recent fall in [facility] while doing rehab 2 months ago.   He is supposed to use a walker.  He was working with PT but his balance remains off when he first stands and is walking.  He saw OT earlier this week.  Appetite and fluid intake are good.  Denies CP, SOB, nausea, vomiting, diarrhea or constipation.  He has a SP catheter that is to be cleaned 2 times a day but granddaughter is unsure if this is actually occurring.  He has SP cath changes monthly at [facility].  Healed abscess wound in scrotum but has d/c from area of catheter.  No coughing or choking after he eats or drinks.  Not been taking ensure in the last month.  He has gained a little weight.  He was on iron but has not had any since that prescription ran out or been rechecked.  Mood is good, "having a good day today".   History obtained from review of EMR, discussion with granddaughter and/or Mr. Holshouser.      Latest Ref Rng & Units 09/05/2021    1:10 PM 08/08/2021    2:22 AM 08/07/2021    4:22 AM  CMP  Glucose 70 - 99 mg/dL 119  114  114   BUN 8 - 23 mg/dL '14  18  18   '$ Creatinine 0.61 - 1.24 mg/dL 1.03  0.80  0.78   Sodium 135 - 145 mmol/L 140  139  137   Potassium 3.5 - 5.1 mmol/L 3.8  3.8  3.0   Chloride 98 - 111 mmol/L 104  109  106   CO2 22 - 32 mmol/L '27  23  24   '$ Calcium 8.9 - 10.3 mg/dL 9.4  8.5  8.7   Total Protein 6.5 - 8.1 g/dL 6.9  6.1  6.1   Total Bilirubin 0.3 - 1.2 mg/dL 0.4  0.5  0.5   Alkaline Phos 38 - 126 U/L 103  67  71   AST 15 - 41 U/L '14  23  19   '$ ALT 0 - 44 U/L '8  18  17        '$ Latest Ref Rng & Units 09/05/2021    1:10 PM 08/08/2021    2:22 AM 08/07/2021    4:22 AM  CBC  WBC 4.0 - 10.5 K/uL 12.1  8.3  12.2   Hemoglobin 13.0 - 17.0 g/dL 10.4  9.5  9.6   Hematocrit 39.0 - 52.0 % 32.9  30.0  30.9   Platelets 150 - 400 K/uL 461  492  486     PSA Serum 09/05/21 26.1 07/30/21 CT abd/pelvis without contrast: IMPRESSION: 1. Decompressed urinary bladder in the setting of a suprapubic catheter. Markedly limited evaluation on this noncontrast study. 2.  Interval increase in size of a right renal 3 x 2 cm mass. Finding concerning for renal cell carcinoma. Recommend MRI renal protocol for further evaluation. 3. Difficult to visualize possibly increased in size 1.6 cm (from 1.3 cm) pancreatic head lesion. This can further be evaluated on MRI. 4. Decreased in size 0.9 cm left periaortic lymph node. Stable minimally enlarged left inguinal lymph node. 5. Similar-appearing L4 sclerotic lesion. 6. Status post prostatectomy and pelvic lymph node dissection. 7. Diffuse sigmoid diverticulosis with no findings of acute diverticulitis.  Per review of EMR notes from Urology-Pt had complications  from his implanted penile prosthesis that required its removal and he subsequently developed a scrotal abscess.  He had Urosepsis in June + for ESBL Klebsiella pneumoniae and MRSA.  He was initially treated with Vancomycin then changed to Zyvox and Meropenem. PSA values were 6.56-7.74 in 2019 and 2020, was 183 on 03/11/21 thought to be related to acute clot retention.   I reviewed available labs, medications, imaging, studies and related documents from the EMR.  Records reviewed and summarized above.   ROS General: NAD ENMT: denies dysphagia Cardiovascular: denies chest pain, denies DOE Pulmonary: denies cough, denies SOB Abdomen: endorses good appetite, denies constipation, endorses fecal incontinence GU: d/c from SP cath ostomy site, SP cath MSK:  denies increased weakness but balance is worse, neck pain  Skin: scrotal abscess wound site healed Neurological: endorses pain with movement of his neck, denies insomnia Psych: Endorses positive mood    Physical Exam: Current and past weights:  118 lbs 4.8 ounces as of 09/05/21, 135 lbs on 05/16/21 Constitutional: NAD General: thin, WD CV: No visible central cyanosis, able to speak complete sentences without having to stop to breathe Pulmonary: No audible cough, rales, wheezes or dyspnea, no visible  respiratory distress Neuro:  some short term memory deficits-forgetting details Psych: non-anxious affect, A and O x 3   Thank you for the opportunity to participate in the care of Mr. Schmelzer.  The palliative care team will continue to follow. Please call our office at [phone] if we can be of additional assistance.   Marijo Conception, FNP -C  COVID-19 PATIENT SCREENING TOOL Asked and negative response unless otherwise noted:   Have you had symptoms of covid, tested positive or been in contact with someone with symptoms/positive test in the past 5-10 days?  NO

## 2021-10-16 ENCOUNTER — Other Ambulatory Visit: Payer: Medicare HMO | Admitting: Family Medicine

## 2021-10-16 VITALS — BP 138/76 | HR 83

## 2021-10-16 DIAGNOSIS — Z599 Problem related to housing and economic circumstances, unspecified: Secondary | ICD-10-CM

## 2021-10-16 DIAGNOSIS — E43 Unspecified severe protein-calorie malnutrition: Secondary | ICD-10-CM

## 2021-10-16 DIAGNOSIS — C61 Malignant neoplasm of prostate: Secondary | ICD-10-CM

## 2021-10-16 DIAGNOSIS — K8689 Other specified diseases of pancreas: Secondary | ICD-10-CM

## 2021-10-16 DIAGNOSIS — Z515 Encounter for palliative care: Secondary | ICD-10-CM

## 2021-10-16 DIAGNOSIS — N2889 Other specified disorders of kidney and ureter: Secondary | ICD-10-CM

## 2021-10-16 NOTE — Progress Notes (Unsigned)
[facility] Consult Note Telephone: [phone]  Fax: [phone]    Date of encounter: 10/16/21 2:05 PM PATIENT NAME: Andre Casey [address] [place] [address]   [phone] (home) [phone] (work) DOB: August 26, 1929 MRN: [id number] PRIMARY CARE PROVIDER:    [name], MD,  Andre Casey [place] [address] [phone]  REFERRING PROVIDER:   [name], MD [address] [place],  [place] [address] [phone]  RESPONSIBLE PARTY:    Contact Information     Name Relation Home Work Andre Casey Son [phone]  [phone]   Andre Casey [phone]  [phone]   Andre Casey Other [phone]  [phone]   Andre Casey Daughter   [phone]        I met face to face with patient and granddaughter Andre Casey in his home. Palliative Care was asked to follow this patient by consultation request of  Andre Neer, MD to address advance care planning and complex medical decision making. This is a follow up visit.  ASSESSMENT, SYMPTOM MANAGEMENT AND PLAN / RECOMMENDATIONS:    Palliative Care Encounter Created and uploaded DNR to [facility] per pt's wishes. Sent granddaughter Andre Casey information on Palliative Care, Caregiver Resources, Severe Protein Calorie Malnutrition from [email or web address] with resource for cachexia and anorexia. Also provided [place] copy of MOST form to share with family.  Reviewed options in depth for completion of MOST.   Metastatic Prostate Cancer, right renal mass and pancreatic  head lesion Has SP cath being changed at Urology office monthly with pt inability to clean SP cath. Continue Eligard (ADT) Q 6 months, next due in December at Urologist's. Right renal mass enlarging with features concerning for primary RCC. Oncologist has stated given pt's dementia, frailty and advanced prostate cancer the only current  treatment is to continue his ADT. Have encouraged granddaughter to discuss incidental findings of renal mass and pancreatic head mass along with lymphadenopathy and L4 sclerotic metastasis with Urologist to see if continued hormone therapy is very beneficial vs being able to refer to [facility] for additional in home services such as aide to help with bathing and SP cath changes/family support and symptom management.  3.    Severe protein calorie malnutrition Weight loss of 17 lbs in 4 months, underweight/cachectic.  Albumin has been 2.3-3.6 in the last 1-2 months Continue Ensure supplement TID  4.    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  Advance Care Planning/Goals of Care: Goals include to maximize quality of life and symptom management. Patient/health care surrogate gave their  permission to discuss. Our advance care planning conversation included a discussion about:    The value and importance of advance care planning  Exploration of goals of care in the event of a sudden injury or illness- granddaughter states pt is DNR but does not have a copy Identification of a healthcare agent-son Andre Casey and daughter Andre Casey are [facility] POA Review and updating or creation of an advance directive document-reviewed MOST form and will send electronic copy for review by [facility] POA via granddaughter's email.  Copy of DNR made and left for pt's refrigerator as DNR status  written in chart, uploaded to [facility]. Decision not to resuscitate or to de-escalate disease focused treatments due to poor prognosis. CODE STATUS: DNR   Follow up Palliative Care Visit: Palliative  care will continue to follow for complex medical decision making, advance care planning, and clarification of goals. Return 4 weeks or prn.    This visit was coded based on medical decision making (MDM).  PPS: 60%  HOSPICE ELIGIBILITY/DIAGNOSIS: TBD  Chief Complaint: Palliative Care is continuing to follow patient for chronic management in setting of metastatic prostate cancer and to help with advance care planning/goals of care discussions.  HISTORY OF PRESENT ILLNESS:  Andre Sar. is a 88 y.o. year old male with dementia who has prostate cancer metastatic to bone with sclerotic lesion in L4 vertebrae.  On review of CT of abd/pelvis done in May and June, it is also noteworthy that he has an enlarging right renal mass consistent with renal carcinoma and an enlarging pancreatic head lesion which has resulted in pancreatic duct dilatation, also concerning for cancer. He has retroperitoneal and inguinal lymphadenopathy on scans as well some of which may be relative to recent scrotal abscess.  Granddaughter states he has been released by his Oncologist Andre Casey stating "there was nothing more he could do."  Notes from most recent visit 09/05/21 from Andre Casey indicate that he has to have help with all ADLs including bathing and dressing, feeding that he is incontinent and that given his frailty, advanced cancer and cognitive decline that further treatment other than his androgen deprivation therapy is not advisable and if he has future disease progression that he would likely benefit from Hospice.  His follow up was set to "as needed".  Has pain in his neck due to severe cervical stenosis which is inoperable with numbness in fingertips of both hands.  Has to have help with buttons and zippers.  Had 2 falls at [facility] 6/15 and 08/25/21. Appetite is waxing and waning, granddaughter has to put food in front of him at times for him to eat or he won't eat. Has wheezing and DOE, not coughing  anything up. Rarely has trouble with constipation. No trouble with coughing or choking when eating or drinking. Has had recent issues with sepsis and scrotal abscess with urology provider wanting catheter changes for twice monthly.  They are looking for someone to change his catheters at home per granddaughter.  She is having difficulty as pt has a long term care policy but pt has been denied assistance and she is trying to resubmit the requested information. Pt endorses pain in his back and neck which can be very severe but he does not like to take pain meds.  Denies abd pain, flank pain and has indwelling SP catheter to gravity drainage with leg bag.  Granddaughter states he has had frank hematuria in the past and even recently.  He is currently continuing to receive hormone therapy in form of Eligard (leuprolide) 45 mg Q 6 months from Urologist, prostate cancer was listed as castration sensitive.   History obtained from review of EMR, discussion with primary team, and interview with family, facility staff/caregiver and/or Mr. Croson.   CT ABD/Pelvis with contrast 07/03/21: CLINICAL DATA:  Intra-abdominal abscess Concern for infection around suprapubic catheter   Abdominal pain.   EXAM: CT ABDOMEN AND PELVIS WITH CONTRAST   TECHNIQUE: Multidetector CT imaging of the abdomen and pelvis was performed using the standard protocol following bolus administration of intravenous contrast.   RADIATION  DOSE REDUCTION: This exam was performed according to the departmental dose-optimization program which includes automated exposure control, adjustment of the mA and/or kV according to patient size and/or use of iterative reconstruction technique.   CONTRAST:  26m OMNIPAQUE IOHEXOL 300 MG/ML  SOLN   COMPARISON:  CT 04/25/2021, additional priors.  MRI 02/15/2019   FINDINGS: Lower chest: Emphysema. No focal airspace disease or pleural effusion. Tiny hiatal hernia.   Hepatobiliary: No focal liver  abnormality is seen. No gallstones, gallbladder wall thickening, or biliary dilatation.   Pancreas: Again seen 13 mm hypodense lesion in the pancreatic head, stable from prior exams, including 02/15/2019 MRI. Stable mild pancreatic ductal dilatation. Mild parenchymal atrophy. No acute inflammation.   Spleen: Normal in size without focal abnormality.   Adrenals/Urinary Tract: Normal adrenal glands solid enhancing renal mass in the lateral right kidney measures 2.5 cm, series 2, image 29, 2.2 cm on 2020 MRI. There are tiny cortical hypodensities in the kidneys are too small to characterize, likely small cysts. No hydronephrosis. No significant perinephric edema. Homogeneous enhancement with symmetric excretion on delayed phase imaging.   Suprapubic tube decompresses the urinary bladder. There is soft tissue thickening along the catheter track extending from the skin surface to the balloon. No drainable or focal fluid collection. The bladder is collapsed around the Foley balloon.   Stomach/Bowel: Small hiatal hernia. The stomach is otherwise unremarkable there is no small bowel obstruction or inflammation. Colonic diverticulosis, prominent in the sigmoid colon, without focal diverticulitis or acute colonic inflammation. Normal appendix.   Vascular/Lymphatic: Aortic atherosclerosis and tortuosity. There is improving retroperitoneal adenopathy. Lymph node posterior to the aorta at the level of the lower kidneys measures 12 x 20 mm, series 2, image 35, previously 18 x 28 mm. Multiple additional retroperitoneal nodes have also diminished in size. Left external iliac node has diminished in size, 16 mm short axis series 2, image 58, previously 21 mm. Prominent but decreasing left inguinal adenopathy. No definite new or progressive adenopathy, streak artifact from surgical clips partially obscures detailed pelvic assessment.   Reproductive: Prostatectomy. Removal of prior penile  prosthesis from prior exam.   Other: No ascites or free air.   Musculoskeletal: There is a new sclerotic focus within posterior L4 vertebra suspicious for metastatic disease. Scoliosis and degenerative change throughout the spine.   IMPRESSION: 1. Suprapubic tube in place with soft tissue thickening along the catheter track extending from the skin surface to the balloon. No drainable or focal fluid collection. Urinary bladder is collapsed around the balloon. 2. Improved retroperitoneal and left inguinal adenopathy from March CT. 3. New sclerotic focus within posterior L4 vertebra suspicious for metastatic disease. 4. Unchanged solid enhancing 2.5 cm right renal mass, highly suspicious for renal cell carcinoma. 5. Stable pancreatic head hypodensity, previously characterized on MRI. 6. Colonic diverticulosis without acute inflammation. 7. Additional stable chronic findings as described.   Aortic Atherosclerosis (ICD10-I70.0) and Emphysema (ICD10-J43.9).  CT ABD/Pelvis without contrast 07/30/21: CLINICAL DATA:  Abdominal pain, acute, nonlocalized pus drainage from supra pubic cath site   EXAM: CT ABDOMEN AND PELVIS WITHOUT CONTRAST   TECHNIQUE: Multidetector CT imaging of the abdomen and pelvis was performed following the standard protocol without IV contrast.   RADIATION DOSE REDUCTION: This exam was performed according to the departmental dose-optimization program which includes automated exposure control, adjustment of the mA and/or kV according to patient size and/or use of iterative reconstruction technique.   COMPARISON:  CT abdomen pelvis 07/03/2021   FINDINGS: Lower chest: No acute  abnormality.   Hepatobiliary: No focal liver abnormality. No gallstones, gallbladder wall thickening, or pericholecystic fluid. No biliary dilatation.   Pancreas: Difficult to visualize possibly increased in size 1.6 cm (from 1.3 cm) pancreatic head lesion (2:24). Normal  pancreatic contour. No surrounding inflammatory changes. No main pancreatic ductal dilatation.   Spleen: Normal in size without focal abnormality.   Adrenals/Urinary Tract:   No adrenal nodule bilaterally.   No nephrolithiasis and no hydronephrosis. Interval increase in size of a right renal 3 x 2 cm mass.   No ureterolithiasis or hydroureter.   Urinary bladder is decompressed with suprapubic catheter the noted to terminate within the urinary bladder wall lumen.   Stomach/Bowel: Stomach is within normal limits. No evidence of bowel wall thickening or dilatation. Diffuse sigmoid diverticulosis. Appendix appears normal.   Vascular/Lymphatic:   No abdominal aorta or iliac aneurysm. Mild atherosclerotic plaque of the aorta and its branches.   Status post pelvic lymph node dissection. Slightly limited evaluation of the pelvis due to streak artifact. Decreased in size 0.9 cm left periaortic lymph node (2:31). Stable minimally enlarged left inguinal lymph node (1.6 cm.)   Reproductive: Status post prostatectomy   Other:   No intraperitoneal free fluid. No intraperitoneal free gas. No organized fluid collection.   Musculoskeletal:   No abdominal wall hernia or abnormality.   Similar-appearing L4 sclerotic lesion. No acute displaced fracture. Multilevel degenerative changes of the spine. Grade 1 anterolisthesis of L5 on S1. Mild retrolisthesis of L2 on L3 and L3 on L4.   IMPRESSION: 1. Decompressed urinary bladder in the setting of a suprapubic catheter. Markedly limited evaluation on this noncontrast study. 2. Interval increase in size of a right renal 3 x 2 cm mass. Finding concerning for renal cell carcinoma. Recommend MRI renal protocol for further evaluation. 3. Difficult to visualize possibly increased in size 1.6 cm (from 1.3 cm) pancreatic head lesion. This can further be evaluated on MRI. 4. Decreased in size 0.9 cm left periaortic lymph node. Stable minimally  enlarged left inguinal lymph node. 5. Similar-appearing L4 sclerotic lesion. 6. Status post prostatectomy and pelvic lymph node dissection. 7. Diffuse sigmoid diverticulosis with no findings of acute diverticulitis.   I reviewed available labs, medications, imaging, studies and related documents from the EMR.  Records reviewed and summarized above.   ROS  General: NAD ENMT: denies dysphagia Cardiovascular: denies chest pain, endorses DOE Pulmonary: denies cough, denies increased SOB Abdomen: endorses variable appetite, endorses intermittent constipation improved with Miralax GU: has SP cath draining to leg bag MSK:  endorses increased weakness with need to ambulate with rolling walker, no falls in the last month Skin: denies rashes or wounds Neurological: endorses back and neck pain, denies insomnia Psych: Endorses positive mood  Physical Exam: Current and past weights: 118 lbs 4.8 oz as of 09/05/21 decreased from 135 lbs on 05/16/21 Constitutional: NAD General: frail appearing, thin ENMT: hard of hearing CV: S1S2, RRR, no LE edema Pulmonary: CTAB, no increased work of breathing, no cough/wheeze on room air Abdomen:  normo-active BS + 4 quadrants, soft and non tender, no ascites GU: deferred MSK: noted sarcopenia, moves all extremities, ambulatory with walker Skin: warm and dry, no rashes or wounds on visible skin Neuro:  no generalized weakness,  short term memory deficits Psych: non-anxious affect, A and O x 3   Thank you for the opportunity to participate in the care of Mr. Imhoff.  The palliative care team will continue to follow. Please call our office at [phone]  if we can be of additional assistance.   Marijo Conception, FNP -C  COVID-19 PATIENT SCREENING TOOL Asked and negative response unless otherwise noted:   Have you had symptoms of covid, tested positive or been in contact with someone with symptoms/positive test in the past 5-10 days?  No

## 2021-10-17 ENCOUNTER — Encounter: Payer: Self-pay | Admitting: Family Medicine

## 2021-10-17 DIAGNOSIS — Z599 Problem related to housing and economic circumstances, unspecified: Secondary | ICD-10-CM | POA: Insufficient documentation

## 2021-11-13 ENCOUNTER — Encounter: Payer: Self-pay | Admitting: Family Medicine

## 2021-11-13 ENCOUNTER — Other Ambulatory Visit: Payer: Medicare HMO | Admitting: Family Medicine

## 2021-11-13 VITALS — BP 126/76 | HR 76 | Resp 20

## 2021-11-13 DIAGNOSIS — Z515 Encounter for palliative care: Secondary | ICD-10-CM

## 2021-11-13 DIAGNOSIS — T83510S Infection and inflammatory reaction due to cystostomy catheter, sequela: Secondary | ICD-10-CM

## 2021-11-13 DIAGNOSIS — E43 Unspecified severe protein-calorie malnutrition: Secondary | ICD-10-CM

## 2021-11-13 NOTE — Progress Notes (Signed)
[facility] Consult Note Telephone: [phone]  Fax: [phone]    Date of encounter: 11/13/21 4:33 PM PATIENT NAME: Andre Casey [address] [place] [address]   [phone] (home) [phone] (work) DOB: Dec 19, 1929 MRN: [id number] PRIMARY CARE PROVIDER:    [name], MD,  [place] [address] [place] [address] [phone]  REFERRING PROVIDER:   [name], MD [address] [place],   [address] [phone]  RESPONSIBLE PARTY:    Contact Information     Name Relation Home Work [place] Son [phone]  [phone]   Trula Ore [phone]  [phone]   Casey,(SD)Andre Other [phone]  [phone]   Shona Simpson Daughter   [phone]        I met face to face with patient and family in his home. Palliative Care was asked to follow this patient by consultation request of  [name], MD to address advance care planning and complex medical decision making. This is a follow up visit   ASSESSMENT , SYMPTOM MANAGEMENT AND PLAN / RECOMMENDATIONS:   Palliative Care Encounter Will have phone meeting with children at next visit to discuss goals of care.   Urinary tract infection of indwelling catheter Currently being treated with Cephalexin. Pt not following cleaning techniques due to dementia.  3.  Protein Calorie Malnutrition Remeron dose increased to 15 mg QHS to help stimulate appetite. Encouraged to try smoothies with protein powder, Ensure or Boost TID  Advance Care Planning/Goals of Care: Goals include to maximize quality of life and symptom management. Patient/health care surrogate gave his/her permission to discuss. Our advance care planning conversation included a discussion about:    The value and importance of advance care planning-Address MOST at next visit with children Exploration of goals of  care in the event of a sudden injury or illness-DNR Review of an advance directive document-DNR Decision not to resuscitate or to de-escalate disease focused treatments due to poor prognosis. CODE STATUS:  DNR    Follow up Palliative Care Visit: Palliative care will continue to follow for complex medical decision making, advance care planning, and clarification of goals. Return 4 weeks or prn.    This visit was coded based on medical decision making (MDM).  PPS: 50%  HOSPICE ELIGIBILITY/DIAGNOSIS: TBD  Chief Complaint: Palliative Care is following for medical management of metastatic prostate cancer in setting of dementia and also following to assist with refining goals of care.  HISTORY OF PRESENT ILLNESS:  Andre Casey. is a 88 y.o. year old male with metastatic prostate cancer in setting of dementia. He also has  CAD, HTN, LBBB, fecal incontinence, indwelling catheter with recurrent UTI, anemia, protein calorie malnutrition, hypokalemia/hyponatremia.  Denies CP.  Has a UTI started 2 weeks ago and has had trouble urinating.  Sending off for urine culture.  Sometimes urine is tea colored.  Missed medication doses Sat/Sun. Andre Casey is his primary caregiver and has to handle his household maintenance, provider appointments and his medication but she does not live with pt.  His children live remotely and have his [place] POA.  No trouble with constipation. Increased DOE, no increased SOB.  Has some stumbling and bruising.  Andre Casey states weight has decreased some so dose increased Mirtazapine to 15 mg QHS.  Will be on Cephalexin.  More confusion the last few weeks.  Still working on long term care at home as pt has long term care policy. No coughing  or choking after eating or drinking.  History obtained from review of EMR, discussion with primary caregiver and/or Andre Casey.   I reviewed EMR for available labs, medications, imaging, studies and related documents.  There are no new  records since last visit.   ROS General: NAD ENMT: denies dysphagia Cardiovascular: denies chest pain, endorses DOE Pulmonary: denies cough, endorses increased SOB Abdomen: endorses fair appetite, denies constipation, endorses continence of bowel GU: denies dysuria, endorses incontinence of urine-has catheter MSK:  denies increased weakness and stumbling Skin: denies rashes or wounds Neurological: denies pain, denies insomnia Psych: Endorses positive mood Heme/lymph/immuno: endorses scattered bruises, no abnormal bleeding  Physical Exam: Current and past weights: 118 lbs 4.8 oz as of 09/05/21 Constitutional: NAD General: frail appearing, thin ENMT: intact hearing, oral mucous membranes moist, dentition intact CV: S1S2, RRR, trace pedal edema bilat Pulmonary: CTAB, no increased work of breathing, no cough, room air Abdomen: normo-active BS + 4 quadrants, soft and non tender, no ascites GU: , has SP cath MSK: no sarcopenia, moves all extremities, ambulatory Skin: warm and dry, no rashes or wounds on visible skin Neuro:  no generalized weakness,  noted cognitive impairment Psych: non-anxious affect, A and O x 2 Hem/lymph/immuno: no widespread bruising   Thank you for the opportunity to participate in the care of Andre Casey.  The palliative care team will continue to follow. Please call our office at [phone] if we can be of additional assistance.   Marijo Conception, FNP -C  COVID-19 PATIENT SCREENING TOOL Asked and negative response unless otherwise noted:   Have you had symptoms of covid, tested positive or been in contact with someone with symptoms/positive test in the past 5-10 days?  no

## 2021-12-12 ENCOUNTER — Other Ambulatory Visit: Payer: Medicare HMO | Admitting: Family Medicine

## 2021-12-12 VITALS — BP 132/72 | HR 69 | Resp 18 | Wt 132.0 lb

## 2021-12-12 DIAGNOSIS — F02B3 Dementia in other diseases classified elsewhere, moderate, with mood disturbance: Secondary | ICD-10-CM

## 2021-12-12 DIAGNOSIS — Z515 Encounter for palliative care: Secondary | ICD-10-CM

## 2021-12-12 DIAGNOSIS — E43 Unspecified severe protein-calorie malnutrition: Secondary | ICD-10-CM

## 2021-12-12 NOTE — Progress Notes (Signed)
[facility] Consult Note Telephone: [phone]  Fax: [phone]    Date of encounter: 12/12/21 3:46 PM PATIENT NAME: Andre Casey [address] [place] [address]   [phone] (home) [phone] (work) DOB: 1929/05/31 MRN: [id number] PRIMARY CARE PROVIDER:    [name], MD,  [address] [place] [address] [phone]  REFERRING PROVIDER:   [name], MD [address] [place],  [place] [address] [phone]  RESPONSIBLE PARTY:    Contact Information     Name Relation Home Work [place] Son [phone]  [phone]   Trula Ore [phone]  [phone]   Andre Casey,(SD)Suzanne Other [phone]  [phone]   Andre Casey Daughter   [phone]        I met face to face with patient and family in his home. Palliative Care was asked to follow this patient by consultation request of  [name], MD to address advance care planning and complex medical decision making. This is a follow up visit   ASSESSMENT , SYMPTOM MANAGEMENT AND PLAN / RECOMMENDATIONS:   Palliative Care Encounter Both son and daughter want to review MOST form in written form  2.   Moderate late onset of Alzheimer's Dementia Educated family on stages of Dementia and cognitive age 88 along Pharmacist, community.  3.  Protein Calorie Malnutrition Eating increased sweets Encouraged to get protein every 2-3 hours even if small volume.  Advance Care Planning/Goals of Care: Goals include to maximize quality of life and symptom management. Patient/health care surrogate gave his/her permission to discuss. Our advance care planning conversation included a discussion about:    The value and importance of advance care planning-Has DNR and Advance Directive on file Exploration of goals of care in the event of a sudden injury or  illness-DNR Review of an advance directive document-DNR.  Reviewed MOST form, discussed normal progression of Dementia, options available for respiratory support.  Both son and daughter did not want to address MOST decisions today, they wanted document forwarded for review. Decision not to resuscitate or to de-escalate disease focused treatments due to poor prognosis. CODE STATUS:  DNR    Follow up Palliative Care Visit: Palliative care will continue to follow for complex medical decision making, advance care planning, and clarification of goals. Return 4 weeks or prn.    This visit was coded based on medical decision making (MDM).  PPS: 50%  HOSPICE ELIGIBILITY/DIAGNOSIS: TBD  Chief Complaint: Palliative Care is continuing to follow patient for chronic medical management in setting of dementia and to assist refining goals of care. Today we are meeting with pt's son and daughter to discuss MOST and goals of care.  HISTORY OF PRESENT ILLNESS:  Andre Casey. is a 88 y.o. year old male with metastatic prostate cancer in setting of dementia. He also has  CAD, HTN, LBBB, fecal incontinence, indwelling catheter with recurrent UTI, anemia, protein calorie malnutrition, hypokalemia/hyponatremia. Eating ok, has 2 meals per day.   Denies CP, nausea and vomiting, constipation.  Endorses DOE.  Had an occasional fall but without injury.  Andre Casey is his primary caregiver and has to handle his household maintenance, provider appointments and his medication but she does not live with pt.  His children live remotely and have his [place] POA.   Andre Casey states he is sleeping more during the day, being irritable and not understanding why someone needs to come in daily.  She states he has  started cursing at times. No coughing or choking after eating or drinking. Catheter doing ok.  History obtained from review of EMR, discussion with primary caregiver and/or Andre Casey.   I reviewed EMR for available  labs, medications, imaging, studies and related documents.  There are no new records since last visit.   ROS General: NAD ENMT: denies dysphagia Cardiovascular: denies chest pain, endorses DOE Pulmonary: denies cough, no increased SOB Abdomen: endorses good appetite, denies constipation, endorses continence of bowel GU: denies dysuria, endorses incontinence of urine-has catheter MSK:  denies weakness Skin: denies rashes or wounds Neurological: denies pain, denies insomnia Psych: Endorses positive mood but caregiver endorses increased agitation/irritability Heme/lymph/immuno:  no abnormal bleeding  Physical Exam: Current and past weights: no recent weight, 118 lbs 4.8 oz as of 09/05/21 Constitutional: NAD General: frail appearing, thin ENMT: hard of hearing, oral mucous membranes moist, dentition intact CV: S1S2, RRR, trace pedal edema bilat Pulmonary: CTAB, no increased work of breathing, no cough, room air Abdomen: normo-active BS + 4 quadrants, soft and non tender GU:  has SP cath MSK: no sarcopenia, moves all extremities, ambulatory Skin: warm and dry, no rashes or wounds on visible skin Neuro:  no generalized weakness,  noted cognitive impairment Psych: non-anxious affect, A and O x 2 Hem/lymph/immuno: no widespread bruising   Thank you for the opportunity to participate in the care of Andre Casey.  The palliative care team will continue to follow. Please call our office at [phone] if we can be of additional assistance.   Marijo Conception, FNP -C  COVID-19 PATIENT SCREENING TOOL Asked and negative response unless otherwise noted:   Have you had symptoms of covid, tested positive or been in contact with someone with symptoms/positive test in the past 5-10 days?  no

## 2021-12-30 ENCOUNTER — Encounter: Payer: Self-pay | Admitting: Family Medicine
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
## 2022-03-28 ENCOUNTER — Ambulatory Visit: Payer: Medicare HMO | Admitting: Cardiology
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
## 2022-04-24 ENCOUNTER — Telehealth: Payer: Self-pay

## 2022-04-24 NOTE — Telephone Encounter (Signed)
(  1:30 pm) PC SW completed follow-up with patient's son-Andre Casey regarding patient status. He advised that patient remains stable. He has in-home assistance which has helped tremendously. Patient will remain on inactive status. The family will call with changes or needs.

## 2022-04-25 ENCOUNTER — Encounter: Payer: Self-pay | Admitting: General Practice

## 2022-04-25 ENCOUNTER — Ambulatory Visit: Payer: Medicare HMO | Attending: Cardiology | Admitting: General Practice

## 2022-04-25 VITALS — BP 136/68 | HR 85 | Ht 67.0 in | Wt 128.6 lb

## 2022-04-25 DIAGNOSIS — Z9861 Coronary angioplasty status: Secondary | ICD-10-CM

## 2022-04-25 DIAGNOSIS — E785 Hyperlipidemia, unspecified: Secondary | ICD-10-CM | POA: Diagnosis not present

## 2022-04-25 DIAGNOSIS — I2510 Atherosclerotic heart disease of native coronary artery without angina pectoris: Secondary | ICD-10-CM

## 2022-04-25 DIAGNOSIS — C61 Malignant neoplasm of prostate: Secondary | ICD-10-CM

## 2022-04-25 DIAGNOSIS — R062 Wheezing: Secondary | ICD-10-CM

## 2022-04-25 DIAGNOSIS — I10 Essential (primary) hypertension: Secondary | ICD-10-CM | POA: Diagnosis not present

## 2022-04-25 NOTE — Patient Instructions (Signed)
Medication Instructions:  The current medical regimen is effective;  continue present plan and medications as directed. Please refer to the Current Medication list given to you today.  *If you need a refill on your cardiac medications before your next appointment, please call your pharmacy*  Lab Work: NONE If you have labs (blood work) drawn today and your tests are completely normal, you will receive your results only by: [place] (if you have MyChart) OR A paper copy in the mail If you have any lab test that is abnormal or we need to change your treatment, we will call you to review the results.  Testing/Procedures: NONE  Follow-Up: At [facility], you and your health needs are our priority.  As part of our continuing mission to provide you with exceptional heart care, we have created designated Provider Care Teams.  These Care Teams include your primary Cardiologist (physician) and Advanced Practice Providers (APPs -  Physician Assistants and Nurse Practitioners) who all work together to provide you with the care you need, when you need it.  Your next appointment:   12 month(s)  Provider:   Kirk Ruths, MD     Other Instructions MAKE SURE TO DISCUSS REFILL FOR INHALER WITH YOUR PRIMARY MD  CONTINUE TO DO YOUR COUGHING AND DEEP BREATHING EXERCISES

## 2022-05-01 ENCOUNTER — Other Ambulatory Visit: Payer: Self-pay | Admitting: Family Medicine

## 2022-05-01 ENCOUNTER — Ambulatory Visit
Admission: RE | Admit: 2022-05-01 | Discharge: 2022-05-01 | Disposition: A | Discharge status: Home / Self Care | Payer: Medicare HMO | Source: Ambulatory Visit | Attending: Family Medicine | Admitting: Family Medicine

## 2022-05-01 DIAGNOSIS — R062 Wheezing: Secondary | ICD-10-CM

## 2022-06-23 ENCOUNTER — Other Ambulatory Visit: Payer: Self-pay | Admitting: Neurology

## 2023-01-23 ENCOUNTER — Emergency Department (HOSPITAL_COMMUNITY): Payer: Medicare HMO

## 2023-01-23 ENCOUNTER — Other Ambulatory Visit: Payer: Self-pay

## 2023-01-23 ENCOUNTER — Encounter (HOSPITAL_COMMUNITY): Payer: Self-pay | Admitting: *Deleted

## 2023-01-23 ENCOUNTER — Inpatient Hospital Stay (HOSPITAL_COMMUNITY)
Admission: EM | Admit: 2023-01-23 | Discharge: 2023-01-30 | DRG: 698 | Disposition: A | Discharge status: Skilled Nursing Facility | Payer: Medicare HMO | Attending: Internal Medicine | Admitting: Internal Medicine

## 2023-01-23 DIAGNOSIS — Z825 Family history of asthma and other chronic lower respiratory diseases: Secondary | ICD-10-CM

## 2023-01-23 DIAGNOSIS — I252 Old myocardial infarction: Secondary | ICD-10-CM

## 2023-01-23 DIAGNOSIS — E860 Dehydration: Principal | ICD-10-CM | POA: Diagnosis present

## 2023-01-23 DIAGNOSIS — Z87891 Personal history of nicotine dependence: Secondary | ICD-10-CM

## 2023-01-23 DIAGNOSIS — Z1611 Resistance to penicillins: Secondary | ICD-10-CM | POA: Diagnosis present

## 2023-01-23 DIAGNOSIS — R627 Adult failure to thrive: Secondary | ICD-10-CM | POA: Diagnosis present

## 2023-01-23 DIAGNOSIS — C61 Malignant neoplasm of prostate: Secondary | ICD-10-CM | POA: Diagnosis present

## 2023-01-23 DIAGNOSIS — N179 Acute kidney failure, unspecified: Secondary | ICD-10-CM | POA: Diagnosis present

## 2023-01-23 DIAGNOSIS — B961 Klebsiella pneumoniae [K. pneumoniae] as the cause of diseases classified elsewhere: Secondary | ICD-10-CM | POA: Diagnosis present

## 2023-01-23 DIAGNOSIS — N2889 Other specified disorders of kidney and ureter: Secondary | ICD-10-CM | POA: Diagnosis present

## 2023-01-23 DIAGNOSIS — Z96611 Presence of right artificial shoulder joint: Secondary | ICD-10-CM | POA: Diagnosis present

## 2023-01-23 DIAGNOSIS — B9561 Methicillin susceptible Staphylococcus aureus infection as the cause of diseases classified elsewhere: Secondary | ICD-10-CM | POA: Diagnosis present

## 2023-01-23 DIAGNOSIS — Z955 Presence of coronary angioplasty implant and graft: Secondary | ICD-10-CM

## 2023-01-23 DIAGNOSIS — I38 Endocarditis, valve unspecified: Secondary | ICD-10-CM | POA: Diagnosis not present

## 2023-01-23 DIAGNOSIS — Z6820 Body mass index (BMI) 20.0-20.9, adult: Secondary | ICD-10-CM

## 2023-01-23 DIAGNOSIS — Z8249 Family history of ischemic heart disease and other diseases of the circulatory system: Secondary | ICD-10-CM

## 2023-01-23 DIAGNOSIS — I10 Essential (primary) hypertension: Secondary | ICD-10-CM | POA: Diagnosis present

## 2023-01-23 DIAGNOSIS — T83510A Infection and inflammatory reaction due to cystostomy catheter, initial encounter: Secondary | ICD-10-CM | POA: Diagnosis present

## 2023-01-23 DIAGNOSIS — N390 Urinary tract infection, site not specified: Secondary | ICD-10-CM | POA: Diagnosis present

## 2023-01-23 DIAGNOSIS — A419 Sepsis, unspecified organism: Secondary | ICD-10-CM

## 2023-01-23 DIAGNOSIS — R6520 Severe sepsis without septic shock: Secondary | ICD-10-CM | POA: Diagnosis present

## 2023-01-23 DIAGNOSIS — Z923 Personal history of irradiation: Secondary | ICD-10-CM

## 2023-01-23 DIAGNOSIS — A4101 Sepsis due to Methicillin susceptible Staphylococcus aureus: Secondary | ICD-10-CM | POA: Diagnosis present

## 2023-01-23 DIAGNOSIS — Z66 Do not resuscitate: Secondary | ICD-10-CM | POA: Diagnosis present

## 2023-01-23 DIAGNOSIS — E43 Unspecified severe protein-calorie malnutrition: Secondary | ICD-10-CM | POA: Diagnosis present

## 2023-01-23 DIAGNOSIS — R413 Other amnesia: Secondary | ICD-10-CM | POA: Diagnosis present

## 2023-01-23 DIAGNOSIS — E872 Acidosis, unspecified: Secondary | ICD-10-CM | POA: Diagnosis present

## 2023-01-23 DIAGNOSIS — E861 Hypovolemia: Secondary | ICD-10-CM | POA: Diagnosis present

## 2023-01-23 DIAGNOSIS — R531 Weakness: Secondary | ICD-10-CM | POA: Diagnosis not present

## 2023-01-23 DIAGNOSIS — E871 Hypo-osmolality and hyponatremia: Secondary | ICD-10-CM | POA: Diagnosis present

## 2023-01-23 DIAGNOSIS — M85812 Other specified disorders of bone density and structure, left shoulder: Secondary | ICD-10-CM | POA: Diagnosis present

## 2023-01-23 DIAGNOSIS — E7800 Pure hypercholesterolemia, unspecified: Secondary | ICD-10-CM | POA: Diagnosis present

## 2023-01-23 DIAGNOSIS — Z9359 Other cystostomy status: Secondary | ICD-10-CM

## 2023-01-23 DIAGNOSIS — R296 Repeated falls: Secondary | ICD-10-CM | POA: Diagnosis present

## 2023-01-23 DIAGNOSIS — Z1612 Extended spectrum beta lactamase (ESBL) resistance: Secondary | ICD-10-CM | POA: Diagnosis present

## 2023-01-23 DIAGNOSIS — Z87440 Personal history of urinary (tract) infections: Secondary | ICD-10-CM

## 2023-01-23 DIAGNOSIS — M25511 Pain in right shoulder: Secondary | ICD-10-CM | POA: Diagnosis present

## 2023-01-23 DIAGNOSIS — Z888 Allergy status to other drugs, medicaments and biological substances status: Secondary | ICD-10-CM

## 2023-01-23 DIAGNOSIS — J9811 Atelectasis: Secondary | ICD-10-CM | POA: Diagnosis present

## 2023-01-23 DIAGNOSIS — E876 Hypokalemia: Secondary | ICD-10-CM | POA: Diagnosis present

## 2023-01-23 DIAGNOSIS — Z85828 Personal history of other malignant neoplasm of skin: Secondary | ICD-10-CM

## 2023-01-23 DIAGNOSIS — I2510 Atherosclerotic heart disease of native coronary artery without angina pectoris: Secondary | ICD-10-CM | POA: Diagnosis present

## 2023-01-23 DIAGNOSIS — Z823 Family history of stroke: Secondary | ICD-10-CM

## 2023-01-23 DIAGNOSIS — Z818 Family history of other mental and behavioral disorders: Secondary | ICD-10-CM

## 2023-01-23 DIAGNOSIS — Z7982 Long term (current) use of aspirin: Secondary | ICD-10-CM

## 2023-01-23 DIAGNOSIS — Z8619 Personal history of other infectious and parasitic diseases: Secondary | ICD-10-CM

## 2023-01-23 DIAGNOSIS — M85811 Other specified disorders of bone density and structure, right shoulder: Secondary | ICD-10-CM | POA: Diagnosis present

## 2023-01-23 DIAGNOSIS — R7881 Bacteremia: Secondary | ICD-10-CM | POA: Diagnosis not present

## 2023-01-23 DIAGNOSIS — R9431 Abnormal electrocardiogram [ECG] [EKG]: Secondary | ICD-10-CM | POA: Diagnosis present

## 2023-01-23 DIAGNOSIS — B9620 Unspecified Escherichia coli [E. coli] as the cause of diseases classified elsewhere: Secondary | ICD-10-CM | POA: Diagnosis not present

## 2023-01-23 DIAGNOSIS — Z79899 Other long term (current) drug therapy: Secondary | ICD-10-CM

## 2023-01-23 LAB — CBC
HCT: 31.6 % — ABNORMAL LOW (ref 39.0–52.0)
Hemoglobin: 10.3 g/dL — ABNORMAL LOW (ref 13.0–17.0)
MCH: 28.9 pg (ref 26.0–34.0)
MCHC: 32.6 g/dL (ref 30.0–36.0)
MCV: 88.5 fL (ref 80.0–100.0)
Platelets: 285 10*3/uL (ref 150–400)
RBC: 3.57 MIL/uL — ABNORMAL LOW (ref 4.22–5.81)
RDW: 14.5 % (ref 11.5–15.5)
WBC: 13.8 10*3/uL — ABNORMAL HIGH (ref 4.0–10.5)
nRBC: 0 % (ref 0.0–0.2)

## 2023-01-23 LAB — COMPREHENSIVE METABOLIC PANEL
ALT: 18 U/L (ref 0–44)
AST: 37 U/L (ref 15–41)
Albumin: 2.2 g/dL — ABNORMAL LOW (ref 3.5–5.0)
Alkaline Phosphatase: 74 U/L (ref 38–126)
Anion gap: 12 (ref 5–15)
BUN: 37 mg/dL — ABNORMAL HIGH (ref 8–23)
CO2: 21 mmol/L — ABNORMAL LOW (ref 22–32)
Calcium: 8 mg/dL — ABNORMAL LOW (ref 8.9–10.3)
Chloride: 99 mmol/L (ref 98–111)
Creatinine, Ser: 2.54 mg/dL — ABNORMAL HIGH (ref 0.61–1.24)
GFR, Estimated: 23 mL/min — ABNORMAL LOW (ref >60)
Glucose, Bld: 124 mg/dL — ABNORMAL HIGH (ref 70–99)
Potassium: 2.8 mmol/L — ABNORMAL LOW (ref 3.5–5.1)
Sodium: 132 mmol/L — ABNORMAL LOW (ref 135–145)
Total Bilirubin: 1 mg/dL (ref <1.2)
Total Protein: 5.7 g/dL — ABNORMAL LOW (ref 6.5–8.1)

## 2023-01-23 LAB — URINALYSIS, ROUTINE W REFLEX MICROSCOPIC
Bilirubin Urine: NEGATIVE
Glucose, UA: NEGATIVE mg/dL
Ketones, ur: 5 mg/dL — AB
Nitrite: NEGATIVE
Protein, ur: >=300 mg/dL — AB
Specific Gravity, Urine: 1.013 (ref 1.005–1.030)
WBC, UA: >50 WBC/hpf (ref 0–5)
pH: 6 (ref 5.0–8.0)

## 2023-01-23 LAB — TROPONIN I (HIGH SENSITIVITY)
Troponin I (High Sensitivity): 55 ng/L — ABNORMAL HIGH (ref <18)
Troponin I (High Sensitivity): 35 ng/L — ABNORMAL HIGH (ref <18)

## 2023-01-23 LAB — I-STAT CG4 LACTIC ACID, ED: Lactic Acid, Venous: 1 mmol/L (ref 0.5–1.9)

## 2023-01-23 LAB — LIPASE, BLOOD: Lipase: 21 U/L (ref 11–51)

## 2023-01-23 MED ORDER — SODIUM CHLORIDE 0.9 % IV SOLN: 500.0000 mg | Freq: Three times a day (TID) | INTRAVENOUS | Status: DC

## 2023-01-23 MED ORDER — SODIUM CHLORIDE 0.9 % IV SOLN
500.0000 mg | Freq: Two times a day (BID) | INTRAVENOUS | Status: DC
  Administered 2023-01-23 – 2023-01-24 (×3): 500 mg via INTRAVENOUS
  Filled 2023-01-23 (×5): qty 10

## 2023-01-23 MED ORDER — POTASSIUM CHLORIDE 10 MEQ/100ML IV SOLN
10.0000 meq | INTRAVENOUS | Status: AC
  Administered 2023-01-23 – 2023-01-24 (×4): 10 meq via INTRAVENOUS
  Filled 2023-01-23 (×4): qty 100

## 2023-01-23 MED ORDER — ENOXAPARIN SODIUM 30 MG/0.3ML IJ SOSY
30.0000 mg | PREFILLED_SYRINGE | INTRAMUSCULAR | Status: DC
  Administered 2023-01-23 – 2023-01-29 (×7): 30 mg via SUBCUTANEOUS
  Filled 2023-01-23 (×7): qty 0.3

## 2023-01-23 MED ORDER — LACTATED RINGERS IV BOLUS
1000.0000 mL | Freq: Once | INTRAVENOUS | Status: AC
  Administered 2023-01-23: 1000 mL via INTRAVENOUS

## 2023-01-23 NOTE — ED Notes (Signed)
To ct

## 2023-01-23 NOTE — ED Notes (Signed)
Pt to CT

## 2023-01-23 NOTE — ED Notes (Signed)
1st lac 1.0 in normal range 2nd can be discontinued unless Dr says otherwise

## 2023-01-23 NOTE — ED Notes (Signed)
Granddaughter at the bedside.

## 2023-01-23 NOTE — ED Notes (Signed)
n
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
## 2023-01-23 NOTE — ED Notes (Signed)
Lr running at 130/hr due to the pot drip  cannot be run alone

## 2023-01-23 NOTE — ED Notes (Signed)
The pts urine is dark in color  scant amount

## 2023-01-23 NOTE — ED Triage Notes (Signed)
The pt arrived by [facility] from home he has fallen  a few days ago  lives alone has a granddaughter who checks on him   a and o x4     iv per [employer]
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
## 2023-01-24 DIAGNOSIS — R531 Weakness: Secondary | ICD-10-CM | POA: Diagnosis not present

## 2023-01-24 LAB — BLOOD CULTURE ID PANEL (REFLEXED) - BCID2

## 2023-01-24 LAB — BASIC METABOLIC PANEL
Anion gap: 12 (ref 5–15)
BUN: 38 mg/dL — ABNORMAL HIGH (ref 8–23)
CO2: 18 mmol/L — ABNORMAL LOW (ref 22–32)
Calcium: 7.9 mg/dL — ABNORMAL LOW (ref 8.9–10.3)
Chloride: 102 mmol/L (ref 98–111)
Creatinine, Ser: 2.29 mg/dL — ABNORMAL HIGH (ref 0.61–1.24)
GFR, Estimated: 26 mL/min — ABNORMAL LOW (ref >60)
Glucose, Bld: 107 mg/dL — ABNORMAL HIGH (ref 70–99)
Potassium: 3.2 mmol/L — ABNORMAL LOW (ref 3.5–5.1)
Sodium: 132 mmol/L — ABNORMAL LOW (ref 135–145)

## 2023-01-24 LAB — CBC
HCT: 29.3 % — ABNORMAL LOW (ref 39.0–52.0)
Hemoglobin: 9.7 g/dL — ABNORMAL LOW (ref 13.0–17.0)
MCH: 28.3 pg (ref 26.0–34.0)
MCHC: 33.1 g/dL (ref 30.0–36.0)
MCV: 85.4 fL (ref 80.0–100.0)
Platelets: 250 10*3/uL (ref 150–400)
RBC: 3.43 MIL/uL — ABNORMAL LOW (ref 4.22–5.81)
RDW: 14.6 % (ref 11.5–15.5)
WBC: 13.8 10*3/uL — ABNORMAL HIGH (ref 4.0–10.5)
nRBC: 0 % (ref 0.0–0.2)

## 2023-01-24 LAB — PHOSPHORUS: Phosphorus: 1.6 mg/dL — ABNORMAL LOW (ref 2.5–4.6)

## 2023-01-24 LAB — MAGNESIUM: Magnesium: 2.1 mg/dL (ref 1.7–2.4)

## 2023-01-24 MED ORDER — SODIUM CHLORIDE 0.9 % IV SOLN: INTRAVENOUS | Status: AC

## 2023-01-24 MED ORDER — POTASSIUM PHOSPHATES 15 MMOLE/5ML IV SOLN
30.0000 mmol | Freq: Once | INTRAVENOUS | Status: AC
  Administered 2023-01-24: 30 mmol via INTRAVENOUS
  Filled 2023-01-24: qty 10

## 2023-01-24 MED ORDER — POTASSIUM CHLORIDE 10 MEQ/100ML IV SOLN
10.0000 meq | INTRAVENOUS | Status: AC
  Administered 2023-01-24 (×3): 10 meq via INTRAVENOUS
  Filled 2023-01-24 (×3): qty 100

## 2023-01-24 MED ORDER — PROCHLORPERAZINE EDISYLATE 10 MG/2ML IJ SOLN: 5.0000 mg | Freq: Four times a day (QID) | INTRAMUSCULAR | Status: DC | PRN

## 2023-01-24 MED ORDER — POLYETHYLENE GLYCOL 3350 17 G PO PACK: 17.0000 g | PACK | Freq: Every day | ORAL | Status: DC | PRN

## 2023-01-24 MED ORDER — ACETAMINOPHEN 325 MG PO TABS
325.0000 mg | ORAL_TABLET | Freq: Once | ORAL | Status: DC
  Filled 2023-01-24: qty 1

## 2023-01-24 MED ORDER — POTASSIUM CHLORIDE CRYS ER 20 MEQ PO TBCR: 20.0000 meq | EXTENDED_RELEASE_TABLET | Freq: Two times a day (BID) | ORAL | Status: DC

## 2023-01-24 MED ORDER — ACETAMINOPHEN 325 MG PO TABS
650.0000 mg | ORAL_TABLET | Freq: Four times a day (QID) | ORAL | Status: DC | PRN
  Administered 2023-01-24 – 2023-01-30 (×6): 650 mg via ORAL
  Filled 2023-01-24 (×6): qty 2

## 2023-01-24 MED ORDER — ACETAMINOPHEN 500 MG PO TABS
1000.0000 mg | ORAL_TABLET | Freq: Once | ORAL | Status: AC
  Administered 2023-01-24: 1000 mg via ORAL
  Filled 2023-01-24: qty 2

## 2023-01-24 MED ORDER — MELATONIN 5 MG PO TABS: 5.0000 mg | ORAL_TABLET | Freq: Every evening | ORAL | Status: DC | PRN

## 2023-01-24 NOTE — Plan of Care (Signed)

## 2023-01-24 NOTE — Progress Notes (Addendum)
PHARMACY - PHYSICIAN COMMUNICATION CRITICAL VALUE ALERT - BLOOD CULTURE IDENTIFICATION (BCID)  Andre Vlad. is an 89 y.o. male who presented to [facility] on 01/23/2023 with generalized weakness and concern for complicated UTI infection.   Assessment: MSSA detected in 3/4 bottles. Patient was started meropenem due to history of ESBL klebsiella pnuemonia UTI. Discussed antibiotic plan with Dr. Elvera Lennox, would not like to de-escalate antibiotics at this time. Will defer to ID for further modifications to antibiotic therapy.  Name of physician (or Provider) Contacted: Dr. Elvera Lennox  Current antibiotics: Meropenem 500 mg IV every 12 hours  Changes to prescribed antibiotics recommended:  Recommendations declined by provider due to history of resistant organism  Results for orders placed or performed during the hospital encounter of 01/23/23  Blood Culture ID Panel (Reflexed) (Collected: 01/23/2023  9:17 PM)  Result Value Ref Range   Enterococcus faecalis NOT DETECTED NOT DETECTED   Enterococcus Faecium NOT DETECTED NOT DETECTED   Listeria monocytogenes NOT DETECTED NOT DETECTED   Staphylococcus species DETECTED (A) NOT DETECTED   Staphylococcus aureus (BCID) DETECTED (A) NOT DETECTED   Staphylococcus epidermidis NOT DETECTED NOT DETECTED   Staphylococcus lugdunensis NOT DETECTED NOT DETECTED   Streptococcus species NOT DETECTED NOT DETECTED   Streptococcus agalactiae NOT DETECTED NOT DETECTED   Streptococcus pneumoniae NOT DETECTED NOT DETECTED   Streptococcus pyogenes NOT DETECTED NOT DETECTED   A.calcoaceticus-baumannii NOT DETECTED NOT DETECTED   Bacteroides fragilis NOT DETECTED NOT DETECTED   Enterobacterales NOT DETECTED NOT DETECTED   Enterobacter cloacae complex NOT DETECTED NOT DETECTED   Escherichia coli NOT DETECTED NOT DETECTED   Klebsiella aerogenes NOT DETECTED NOT DETECTED   Klebsiella oxytoca NOT DETECTED NOT DETECTED   Klebsiella pneumoniae NOT DETECTED NOT  DETECTED   Proteus species NOT DETECTED NOT DETECTED   Salmonella species NOT DETECTED NOT DETECTED   Serratia marcescens NOT DETECTED NOT DETECTED   Haemophilus influenzae NOT DETECTED NOT DETECTED   Neisseria meningitidis NOT DETECTED NOT DETECTED   Pseudomonas aeruginosa NOT DETECTED NOT DETECTED   Stenotrophomonas maltophilia NOT DETECTED NOT DETECTED   Candida albicans NOT DETECTED NOT DETECTED   Candida auris NOT DETECTED NOT DETECTED   Candida glabrata NOT DETECTED NOT DETECTED   Candida krusei NOT DETECTED NOT DETECTED   Candida parapsilosis NOT DETECTED NOT DETECTED   Candida tropicalis NOT DETECTED NOT DETECTED   Cryptococcus neoformans/gattii NOT DETECTED NOT DETECTED   Meth resistant mecA/C and MREJ NOT DETECTED NOT DETECTED    Enos Fling, PharmD PGY-1 Acute Care Pharmacy Resident 01/24/2023 2:09 PM

## 2023-01-24 NOTE — Evaluation (Signed)
Occupational Therapy Evaluation Patient Details Name: Andre Casey. MRN: [id number] DOB: 1929-02-28 Today's Date: 01/24/2023   History of Present Illness 89 year old male who comes in from home due to generalized weakness and increased frequency of falls.  Urinalysis was positive for pyuria with increased fevers.  History of prostate cancer, chronic suprapubic catheter, HTN, history of ESBL UTI, CAD.   Clinical Impression   Pt currently at min assist level for selfcare tasks sit to stand and for toilet transfers and toileting using the RW for support.  Pt reporting chest pain which has been ongoing the last day but more as a general soreness vs acute attack.  HR in the low 80s with nursing made aware.  Pt lives alone and has no consistent assist.  Feel he will benefit from acute care OT at this time to progress ADLs to a safer level and improve balance.  Recommend continued rehab post acute stay inpatient follow up therapy, <3 hours/day prior to transfer home.          If plan is discharge home, recommend the following: A little help with walking and/or transfers;A little help with bathing/dressing/bathroom;Assistance with cooking/housework;Assist for transportation;Help with stairs or ramp for entrance    Functional Status Assessment  Patient has had a recent decline in their functional status and demonstrates the ability to make significant improvements in function in a reasonable and predictable amount of time.  Equipment Recommendations  Other (comment) (TBD next venue of care)    Recommendations for Other Services       Precautions / Restrictions Precautions Precautions: Fall Restrictions Weight Bearing Restrictions: No      Mobility Bed Mobility Overal bed mobility: Needs Assistance Bed Mobility: Supine to Sit     Supine to sit: Contact guard, HOB elevated, Used rails          Transfers Overall transfer level: Needs assistance Equipment used: Rolling  walker (2 wheels) Transfers: Sit to/from Stand, Bed to chair/wheelchair/BSC Sit to Stand: Contact guard assist     Step pivot transfers: Min assist            Balance Overall balance assessment: Needs assistance Sitting-balance support: Feet supported, No upper extremity supported Sitting balance-Leahy Scale: Fair     Standing balance support: Bilateral upper extremity supported, During functional activity Standing balance-Leahy Scale: Poor Standing balance comment: Pt needs UE support for balance                           ADL either performed or assessed with clinical judgement   ADL Overall ADL's : Needs assistance/impaired Eating/Feeding: Independent;Sitting   Grooming: Wash/dry hands;Wash/dry face;Set up;Sitting   Upper Body Bathing: Set up;Sitting   Lower Body Bathing: Sit to/from stand;Minimal assistance   Upper Body Dressing : Supervision/safety;Sitting   Lower Body Dressing: Minimal assistance;Sit to/from stand   Toilet Transfer: Minimal assistance;Ambulation;Rolling walker (2 wheels)   Toileting- Clothing Manipulation and Hygiene: Moderate assistance;Sit to/from stand       Functional mobility during ADLs: Minimal assistance;Rolling walker (2 wheels) General ADL Comments: Pt lives alone and has no consistent assist from family.  Agreeable to go to outside rehab if needed.     Vision Baseline Vision/History: 1 Wears glasses (reading only per report) Ability to See in Adequate Light: 0 Adequate Patient Visual Report: No change from baseline Vision Assessment?: No apparent visual deficits     Perception Perception: Within Functional Limits       Praxis  Praxis: WFL       Pertinent Vitals/Pain Pain Assessment Pain Intervention(s): Monitored during session, Repositioned, Other (comment) (RN notified)     Extremity/Trunk Assessment Upper Extremity Assessment Upper Extremity Assessment: LUE deficits/detail LUE Deficits / Details:  shoulder strenght 3+/5, grip strength 4/5 LUE Sensation: decreased light touch (pt reports bilateral digit numbness in fingertips) LUE Coordination: decreased fine motor   Lower Extremity Assessment Lower Extremity Assessment: Defer to PT evaluation   Cervical / Trunk Assessment Cervical / Trunk Assessment: Normal   Communication Communication Cueing Techniques: Verbal cues   Cognition Arousal: Alert Behavior During Therapy: WFL for tasks assessed/performed Overall Cognitive Status: No family/caregiver present to determine baseline cognitive functioning                                 General Comments: Pt pleasant and cooperative.  Slight confusion noted with orientation as he stated "Today is not Thanksgiving is it?"                Home Living Family/patient expects to be discharged to:: Private residence Living Arrangements: Alone Available Help at Discharge: Available PRN/intermittently;Family (Limited support at home. Granddaughter can help but doesnt live close) Type of Home: House Home Access: Stairs to enter [employer] of Steps: 5 Entrance Stairs-Rails: Left Home Layout: One level     Bathroom Shower/Tub: Producer, television/film/video: Standard Bathroom Accessibility: Yes   Home Equipment: Shower seat - built in;Rollator (4 wheels);Cane - single point;BSC/3in1;Grab bars - tub/shower;Grab bars - toilet;Hand held shower head;Wheelchair - manual;Hospital bed          Prior Functioning/Environment Prior Level of Function : Independent/Modified Independent;History of Falls (last six months) (a couple of falls per pt)             Mobility Comments: Mod I furniture walker household distances. ADLs Comments: Ind        OT Problem List: Decreased strength;Impaired balance (sitting and/or standing);Pain;Decreased activity tolerance;Decreased knowledge of use of DME or AE;Decreased cognition      OT Treatment/Interventions:  Self-care/ADL training;DME and/or AE instruction;Therapeutic activities;Balance training;Patient/family education;Neuromuscular education;Therapeutic exercise    OT Goals(Current goals can be found in the care plan section) Acute Rehab OT Goals Patient Stated Goal: Pt did not state this session OT Goal Formulation: With patient Potential to Achieve Goals: Good  OT Frequency: Min 1X/week    Co-evaluation PT/OT/SLP Co-Evaluation/Treatment: Yes     OT goals addressed during session: ADL's and self-care      AM-PAC OT "6 Clicks" Daily Activity     Outcome Measure Help from another person eating meals?: None Help from another person taking care of personal grooming?: A Little Help from another person toileting, which includes using toliet, bedpan, or urinal?: A Little Help from another person bathing (including washing, rinsing, drying)?: A Little Help from another person to put on and taking off regular upper body clothing?: A Little Help from another person to put on and taking off regular lower body clothing?: A Little 6 Click Score: 19   End of Session Equipment Utilized During Treatment: Gait belt;Rolling walker (2 wheels) Nurse Communication: Mobility status  Activity Tolerance: Patient tolerated treatment well Patient left: in bed;with call bell/phone within reach;with bed alarm set  OT Visit Diagnosis: Unsteadiness on feet (R26.81);Other abnormalities of gait and mobility (R26.89);Repeated falls (R29.6);Pain Pain - Right/Left:  (chest pain)  Time: 1223-1259 OT Time Calculation (min): 36 min Charges:  OT General Charges $OT Visit: 1 Visit OT Evaluation $OT Eval Moderate Complexity: 1 Mod Perrin Maltese, OTR/L Acute Rehabilitation Services  Office [phone] 01/24/2023
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
## 2023-01-24 NOTE — Progress Notes (Signed)
TRH night cross cover note:   I was notified by RN that the patient is complaining of some shoulder discomfort, without any report of any additional new symptoms at this time.  Per my brief chart review, it does not appear that he has received any Tylenol since this morning.  I subsequently placed order for acetaminophen 1 g p.o. x 1 dose now to address his shoulder discomfort.   Andre Pigg, DO Hospitalist

## 2023-01-24 NOTE — Progress Notes (Signed)
TRH night cross cover note:   I was notified by RN that the patient's temperature continues to rise, now 102.8, in spite of interval acetaminophen 650 mg, in this patient who is hospitalized with complicated urinary tract infection on meropenem, complicated by acute kidney injury.  I have ordered an additional 325 mg of po acetaminophen x 1 dose now out of also placed nursing communication order requesting placement of ice packs under the patient's bilateral arms.  He does not appear to be a candidate for NSAIDs in the setting of his presenting acute kidney injury.     Newton Pigg, DO Hospitalist

## 2023-01-24 NOTE — Progress Notes (Addendum)
No charge note  Patient seen and examined this morning, admitted overnight, H&P reviewed and I agree with the assessment and plan.  89 year old male with prostate cancer, chronic suprapubic catheter, HTN, history of ESBL UTI who comes in from home due to generalized weakness and increased frequency of falls.  He was septic in the ED with fever of 101.3, tachycardic, and urinalysis positive for pyuria.  He was started on meropenem and admitted to the hospital  Is doing well this morning but still febrile.  Continue supportive care, continue meropenem. Exchange the suprapubic catheter today, discussed with RN. Leukocytosis unchanged at 13.8.  Has hypokalemia and hypophosphatemia, and will go ahead and replace.  Magnesium is normal.  Continue to monitor urine cultures.  PT has been consulted  Scheduled Meds:  acetaminophen  325 mg Oral Once   enoxaparin (LOVENOX) injection  30 mg Subcutaneous Q24H   Continuous Infusions:  sodium chloride 50 mL/hr at 01/24/23 0743   meropenem (MERREM) IV Stopped (01/23/23 2329)   potassium chloride 100 mL/hr at 01/24/23 0744   potassium PHOSPHATE IVPB (in mmol)     PRN Meds:.acetaminophen, melatonin, polyethylene glycol, prochlorperazine  Rosealee Recinos M. Elvera Lennox, MD, PhD [facility]  Between 7 am - 7 pm you can contact me via Amion (for emergencies) or Securechat (non urgent matters).  I am not available 7 pm - 7 am, please contact night coverage MD/APP via Amion

## 2023-01-24 NOTE — Evaluation (Signed)
Physical Therapy Evaluation Patient Details Name: Andre Casey. MRN: [id number] DOB: Mar 14, 1929 Today's Date: 01/24/2023  History of Present Illness  89 year old male who comes in from home due to generalized weakness and increased frequency of falls.  Urinalysis was positive for pyuria with increased fevers.  History of prostate cancer, chronic suprapubic catheter, HTN, history of ESBL UTI, CAD.  Clinical Impression  Pt presents with admitting diagnosis above. Co-treat with OT. Pt today was able to transfer to/from [facility] with CGA/Min A. Pt noted to be very fatigued following transfer. Pt reports that he currently lives alone with limited family support and was Mod I furniture walking household distances at baseline. Given limited family support, patient will benefit from continued inpatient follow up therapy, <3 hours/day. PT will continue to follow.        If plan is discharge home, recommend the following: A little help with walking and/or transfers;A little help with bathing/dressing/bathroom;Assistance with cooking/housework;Direct supervision/assist for medications management;Assist for transportation;Supervision due to cognitive status;Help with stairs or ramp for entrance   Can travel by private vehicle   No    Equipment Recommendations    Recommendations for Other Services       Functional Status Assessment Patient has had a recent decline in their functional status and demonstrates the ability to make significant improvements in function in a reasonable and predictable amount of time.     Precautions / Restrictions Precautions Precautions: Fall Restrictions Weight Bearing Restrictions: No      Mobility  Bed Mobility Overal bed mobility: Needs Assistance Bed Mobility: Supine to Sit     Supine to sit: Contact guard, HOB elevated, Used rails          Transfers Overall transfer level: Needs assistance Equipment used: Rolling walker (2 wheels) Transfers:  Sit to/from Stand, Bed to chair/wheelchair/BSC Sit to Stand: Contact guard assist   Step pivot transfers: Min assist            Ambulation/Gait               General Gait Details: Deferred  Stairs            Wheelchair Mobility     Tilt Bed    Modified Rankin (Stroke Patients Only)       Balance Overall balance assessment: Needs assistance Sitting-balance support: Feet supported, No upper extremity supported Sitting balance-Leahy Scale: Fair     Standing balance support: Bilateral upper extremity supported, During functional activity Standing balance-Leahy Scale: Poor Standing balance comment: Pt needs UE support for balance                             Pertinent Vitals/Pain Pain Assessment Pain Assessment: 0-10 Pain Score: 5  Pain Location: Chest pain Pain Descriptors / Indicators: Discomfort, Grimacing Pain Intervention(s): Monitored during session, Limited activity within patient's tolerance, Repositioned    Home Living Family/patient expects to be discharged to:: Private residence Living Arrangements: Alone Available Help at Discharge: Available PRN/intermittently;Family (Limited support at home. Granddaughter can help but doesnt live close) Type of Home: House Home Access: Stairs to enter Entrance Stairs-Rails: Left Entrance Stairs-Number of Steps: 5   Home Layout: One level Home Equipment: Shower seat - built in;Rollator (4 wheels);Cane - single point;BSC/3in1;Grab bars - tub/shower;Grab bars - toilet;Hand held shower head;Wheelchair - manual;Hospital bed      Prior Function Prior Level of Function : Independent/Modified Independent;History of Falls (last six months) (a couple of falls  per pt)             Mobility Comments: Mod I furniture walker household distances. ADLs Comments: Ind     Extremity/Trunk Assessment   Upper Extremity Assessment Upper Extremity Assessment: LUE deficits/detail LUE Deficits / Details:  shoulder strenght 3+/5, grip strength 4/5 LUE Sensation: decreased light touch (pt reports bilateral digit numbness in fingertips) LUE Coordination: decreased fine motor    Lower Extremity Assessment Lower Extremity Assessment: Generalized weakness    Cervical / Trunk Assessment Cervical / Trunk Assessment: Normal  Communication   Communication Communication: Difficulty communicating thoughts/reduced clarity of speech Cueing Techniques: Verbal cues;Tactile cues  Cognition Arousal: Alert Behavior During Therapy: WFL for tasks assessed/performed Overall Cognitive Status: No family/caregiver present to determine baseline cognitive functioning                                 General Comments: Pt pleasant and cooperative.  Slight confusion noted with orientation as he stated "Today is not Thanksgiving is it?". Pt also noted with some slow processing.        General Comments General comments (skin integrity, edema, etc.): VSS    Exercises     Assessment/Plan    PT Assessment Patient needs continued PT services  PT Problem List Decreased strength;Decreased range of motion;Decreased activity tolerance;Decreased balance;Decreased mobility;Decreased coordination;Decreased knowledge of use of DME;Decreased cognition;Decreased safety awareness;Decreased knowledge of precautions;Cardiopulmonary status limiting activity;Pain       PT Treatment Interventions DME instruction;Gait training;Stair training;Functional mobility training;Therapeutic activities;Therapeutic exercise;Balance training;Neuromuscular re-education;Patient/family education;Cognitive remediation    PT Goals (Current goals can be found in the Care Plan section)  Acute Rehab PT Goals Patient Stated Goal: to get better PT Goal Formulation: With patient Time For Goal Achievement: 02/07/23 Potential to Achieve Goals: Good    Frequency Min 1X/week     Co-evaluation PT/OT/SLP Co-Evaluation/Treatment:  Yes Reason for Co-Treatment: Complexity of the patient's impairments (multi-system involvement);For patient/therapist safety PT goals addressed during session: Mobility/safety with mobility;Proper use of DME OT goals addressed during session: ADL's and self-care       AM-PAC PT "6 Clicks" Mobility  Outcome Measure Help needed turning from your back to your side while in a flat bed without using bedrails?: A Little Help needed moving from lying on your back to sitting on the side of a flat bed without using bedrails?: A Little Help needed moving to and from a bed to a chair (including a wheelchair)?: A Little Help needed standing up from a chair using your arms (e.g., wheelchair or bedside chair)?: A Little Help needed to walk in hospital room?: A Lot Help needed climbing 3-5 steps with a railing? : A Lot 6 Click Score: 16    End of Session Equipment Utilized During Treatment: Gait belt Activity Tolerance: Patient tolerated treatment well;Patient limited by fatigue Patient left: in bed;with call bell/phone within reach;with bed alarm set Nurse Communication: Mobility status PT Visit Diagnosis: Other abnormalities of gait and mobility (R26.89)    Time: 1610-9604 PT Time Calculation (min) (ACUTE ONLY): 49 min   Charges:   PT Evaluation $PT Eval Moderate Complexity: 1 Mod PT Treatments $Therapeutic Activity: 8-22 mins PT General Charges $$ ACUTE PT VISIT: 1 Visit         Shela Nevin, PT, DPT Acute Rehab Services [phone]   Gladys Damme 01/24/2023, 4:16 PM

## 2023-01-24 NOTE — Progress Notes (Signed)
Concern for sepsis. Patient had HR 104, temp 101.3. PRN tylenol was given by Evlyn Clines, LPN. Newton Pigg, DO is aware.  Andre Casey

## 2023-01-24 NOTE — ED Notes (Signed)
2 west had okd this pt to come up

## 2023-01-24 NOTE — Progress Notes (Signed)
Suprapubic catheter exchanged by rehab nurse per order. Provider made aware via secure chat-confirmed understanding.

## 2023-01-25 ENCOUNTER — Inpatient Hospital Stay (HOSPITAL_COMMUNITY): Payer: Medicare HMO

## 2023-01-25 DIAGNOSIS — Z1612 Extended spectrum beta lactamase (ESBL) resistance: Secondary | ICD-10-CM | POA: Diagnosis not present

## 2023-01-25 DIAGNOSIS — B9620 Unspecified Escherichia coli [E. coli] as the cause of diseases classified elsewhere: Secondary | ICD-10-CM | POA: Diagnosis not present

## 2023-01-25 DIAGNOSIS — N390 Urinary tract infection, site not specified: Secondary | ICD-10-CM | POA: Diagnosis not present

## 2023-01-25 DIAGNOSIS — R531 Weakness: Secondary | ICD-10-CM | POA: Diagnosis not present

## 2023-01-25 LAB — BASIC METABOLIC PANEL
Anion gap: 15 (ref 5–15)
BUN: 28 mg/dL — ABNORMAL HIGH (ref 8–23)
CO2: 17 mmol/L — ABNORMAL LOW (ref 22–32)
Calcium: 8.2 mg/dL — ABNORMAL LOW (ref 8.9–10.3)
Chloride: 103 mmol/L (ref 98–111)
Creatinine, Ser: 1.83 mg/dL — ABNORMAL HIGH (ref 0.61–1.24)
GFR, Estimated: 34 mL/min — ABNORMAL LOW (ref >60)
Glucose, Bld: 103 mg/dL — ABNORMAL HIGH (ref 70–99)
Potassium: 3.2 mmol/L — ABNORMAL LOW (ref 3.5–5.1)
Sodium: 135 mmol/L (ref 135–145)

## 2023-01-25 MED ORDER — TRAMADOL HCL 50 MG PO TABS
50.0000 mg | ORAL_TABLET | Freq: Two times a day (BID) | ORAL | Status: DC | PRN
  Administered 2023-01-25 – 2023-01-30 (×2): 50 mg via ORAL
  Filled 2023-01-25 (×2): qty 1

## 2023-01-25 MED ORDER — LOPERAMIDE HCL 2 MG PO CAPS
2.0000 mg | ORAL_CAPSULE | ORAL | Status: DC | PRN
  Administered 2023-01-25: 2 mg via ORAL
  Filled 2023-01-25: qty 1

## 2023-01-25 MED ORDER — NALOXONE HCL 0.4 MG/ML IJ SOLN: 0.4000 mg | INTRAMUSCULAR | Status: DC | PRN

## 2023-01-25 MED ORDER — ERTAPENEM SODIUM 1 G IJ SOLR: 1.0000 g | INTRAMUSCULAR | Status: DC

## 2023-01-25 MED ORDER — SODIUM CHLORIDE 0.9 % IV SOLN
500.0000 mg | INTRAVENOUS | Status: DC
  Administered 2023-01-25: 500 mg via INTRAVENOUS
  Filled 2023-01-25 (×2): qty 500

## 2023-01-25 MED ORDER — SODIUM CHLORIDE 0.9 % IV SOLN
1.0000 g | INTRAVENOUS | Status: DC
  Filled 2023-01-25: qty 1000

## 2023-01-25 MED ORDER — CEFAZOLIN SODIUM-DEXTROSE 2-4 GM/100ML-% IV SOLN
2.0000 g | Freq: Two times a day (BID) | INTRAVENOUS | Status: DC
  Administered 2023-01-25 – 2023-01-30 (×11): 2 g via INTRAVENOUS
  Filled 2023-01-25 (×12): qty 100

## 2023-01-25 MED ORDER — ENSURE ENLIVE PO LIQD
237.0000 mL | Freq: Two times a day (BID) | ORAL | Status: DC
  Administered 2023-01-26 – 2023-01-30 (×9): 237 mL via ORAL

## 2023-01-25 MED ORDER — HYDROMORPHONE HCL 1 MG/ML IJ SOLN
0.5000 mg | Freq: Once | INTRAMUSCULAR | Status: AC
  Administered 2023-01-25: 0.5 mg via INTRAVENOUS
  Filled 2023-01-25: qty 0.5

## 2023-01-25 MED ORDER — DICLOFENAC SODIUM 1 % EX GEL
4.0000 g | Freq: Four times a day (QID) | CUTANEOUS | Status: DC
  Administered 2023-01-25 – 2023-01-29 (×14): 4 g via TOPICAL
  Filled 2023-01-25: qty 100

## 2023-01-25 MED ORDER — POTASSIUM CHLORIDE CRYS ER 20 MEQ PO TBCR
40.0000 meq | EXTENDED_RELEASE_TABLET | Freq: Once | ORAL | Status: AC
  Administered 2023-01-25: 40 meq via ORAL
  Filled 2023-01-25: qty 2

## 2023-01-25 NOTE — [place] FL2 (Signed)
[place] MEDICAID FL2 LEVEL OF CARE FORM     IDENTIFICATION  Patient Name: Andre Casey. Birthdate: [birth year] Sex: male Admission Date (Current Location): 01/23/2023  [facility] and [place] Number:  Producer, television/film/video and Address:  The . [facility], [address] [place], [place] [address]      Provider Number: [id number]  Attending Physician Name and Address:  Azucena Fallen, MD  Relative Name and Phone Number:  Barron Alvine (Daughter)  [phone]    Current Level of Care: Hospital Recommended Level of Care: Skilled Nursing Facility Prior Approval Number:    Date Approved/Denied:   PASRR Number: [id number]  Discharge Plan: SNF    Current Diagnoses: Patient Active Problem List   Diagnosis Date Noted   Generalized weakness 01/23/2023   Inadequate community resources 10/17/2021   AKI (acute kidney injury) (HCC) 07/30/2021   Anemia of chronic disease 07/30/2021   Protein-calorie malnutrition, severe 07/06/2021   Right wrist pain 07/04/2021   UTI (urinary tract infection) 07/03/2021   Hypokalemia 07/03/2021   Palliative care encounter 06/18/2021   Physical deconditioning 04/28/2021   Bandemia 04/27/2021   Severe sepsis (HCC) 04/26/2021   Essential hypertension 04/26/2021   Infection of bladder catheter (HCC) 04/25/2021   AKI on CKD-3A 04/25/2021   Hyponatremia 04/25/2021   Normocytic anemia 04/25/2021   Pancreatic mass 04/25/2021   Lab test positive for detection of COVID-19 virus 04/15/2021   Prostate cancer metastatic to multiple sites (HCC) 04/15/2021   Chronic indwelling Foley catheter 04/15/2021   Moderate late onset Alzheimer's dementia (HCC) 04/08/2021   Gait abnormality 04/08/2021   Renal insufficiency 03/12/2021   Acute lower UTI 04/30/2020   Renal mass, right 12/04/2019   LBBB (left bundle branch block) 06/07/2019   Family history of Alzheimer's disease 03/25/2018   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) 01/15/2016   CAD-S/P PCI/DES 01/15/2016   Family history of sudden cardiac death 01-20-16   Dyslipidemia, goal LDL below 70 01/08/2016   Paresthesia 07/18/2015   S/P shoulder replacement 04/20/2015   Malignant neoplasm of prostate (HCC) 02/09/2015   Urethral stricture 07/25/2013   History of prostate cancer 08/04/2011   ED (erectile dysfunction) of organic origin 02/03/2011   Male urinary stress incontinence 01/31/2011   External hemorrhoids 08/02/2007   Incontinence of feces 08/02/2007   History of colonic polyps 08/02/2007    Orientation RESPIRATION BLADDER Height & Weight     Self, Time, Situation, Place  Normal Incontinent, Indwelling catheter Weight: 130 lb 11.7 oz (59.3 kg) Height:  5\' 7"  (170.2 cm)  BEHAVIORAL SYMPTOMS/MOOD NEUROLOGICAL BOWEL NUTRITION STATUS      Incontinent Diet (see dc summary)  AMBULATORY STATUS COMMUNICATION OF NEEDS Skin   Limited Assist Verbally Other (Comment) (08/05/21 Irritant Dermatitis (Moisture Associated Skin Damage) Sacrum)                       Personal Care Assistance Level of Assistance  Bathing, Feeding, Dressing Bathing Assistance: Limited assistance Feeding assistance: Limited assistance Dressing Assistance: Limited assistance     Functional Limitations Info  Sight, Hearing, Speech Sight Info: Impaired (wears reading glasses) Hearing Info: Impaired Speech Info: Adequate    SPECIAL CARE FACTORS FREQUENCY  PT (By licensed PT), OT (By licensed OT)     PT Frequency: 5x/week OT Frequency: 5x/week            Contractures Contractures Info: Not present    Additional Factors Info  Code Status, Allergies Code Status Info: DNR Allergies Info: Aricept (Donepezil)  Namzaric (Memantine Hcl-donepezil Hcl)           Current Medications (01/25/2023):  This is the current hospital active medication list Current Facility-Administered Medications  Medication Dose Route Frequency  Provider Last Rate Last Admin   acetaminophen (TYLENOL) tablet 650 mg  650 mg Oral Q6H PRN Dow Adolph N, DO   650 mg at 01/25/23 1154   ceFAZolin (ANCEF) IVPB 2g/100 mL premix  2 g Intravenous Q12H Arma Heading, RPH 200 mL/hr at 01/25/23 1156 2 g at 01/25/23 1156   enoxaparin (LOVENOX) injection 30 mg  30 mg Subcutaneous Q24H Dow Adolph N, DO   30 mg at 01/24/23 2038   ertapenem [facility]) 500 mg in sodium chloride 0.9 % 50 mL IVPB  500 mg Intravenous Q24H Danelle Earthly, MD 100 mL/hr at 01/25/23 1000 500 mg at 01/25/23 1000   loperamide (IMODIUM) capsule 2 mg  2 mg Oral PRN Howerter, Justin B, DO   2 mg at 01/25/23 0056   melatonin tablet 5 mg  5 mg Oral QHS PRN Darlin Drop, DO       naloxone [facility]) injection 0.4 mg  0.4 mg Intravenous PRN Howerter, Justin B, DO       polyethylene glycol (MIRALAX / GLYCOLAX) packet 17 g  17 g Oral Daily PRN Dow Adolph N, DO       prochlorperazine (COMPAZINE) injection 5 mg  5 mg Intravenous Q6H PRN Darlin Drop, DO         Discharge Medications: Please see discharge summary for a list of discharge medications.  Relevant Imaging Results:  Relevant Lab Results:   Additional Information SSN: [id number] contact precautions 01/24/23 ESBL, MRSA  Raima Geathers A [place], LCSWA

## 2023-01-25 NOTE — TOC Initial Note (Addendum)
Transition of Care (TOC) - Initial/Assessment Note    Patient Details  Name: Andre Casey. MRN: [id number] Date of Birth: [birth year]  Transition of Care [facility]) CM/SW Contact:    Lakeem Rozo A [place], Theresia Majors Phone Number: 01/25/2023, 11:53 AM  Clinical Narrative:                  CSW met with pt at bedside, pt was sleeping soundly and unable to be roused. CSW reached out to pt's daughter, Hale Bogus, she said that pt would be agreeable to rehab. CSW to completed SNF workup, bed offers pending. CSW to follow up with pt regarding bed offers.   Assessment completed via chart review and pt's daughter.    TOC will continue to follow.  Expected Discharge Plan: Skilled Nursing Facility Barriers to Discharge: Continued Medical Work up, English as a second language teacher, SNF Pending bed offer   Patient Goals and CMS Choice            Expected Discharge Plan and Services In-house Referral: Clinical Social Work                                            Prior Living Arrangements/Services                       Activities of Daily Living   ADL Screening (condition at time of admission) Independently performs ADLs?: Yes (appropriate for developmental age) Is the patient deaf or have difficulty hearing?: Yes Does the patient have difficulty seeing, even when wearing glasses/contacts?: No Does the patient have difficulty concentrating, remembering, or making decisions?: Yes  Permission Sought/Granted                  Emotional Assessment Appearance:: Appears stated age Attitude/Demeanor/Rapport: Unable to Assess Affect (typically observed): Unable to Assess Orientation: : Oriented to Self, Oriented to Place, Oriented to  Time, Oriented to Situation Alcohol / Substance Use: Not Applicable Psych Involvement: No (comment)  Admission diagnosis:  Dehydration [E86.0] Hypokalemia [E87.6] Generalized weakness [R53.1] AKI (acute kidney injury) (HCC) [N17.9] Frequent falls  [R29.6] Patient Active Problem List   Diagnosis Date Noted   Generalized weakness 01/23/2023   Inadequate community resources 10/17/2021   AKI (acute kidney injury) (HCC) 07/30/2021   Anemia of chronic disease 07/30/2021   Protein-calorie malnutrition, severe 07/06/2021   Right wrist pain 07/04/2021   UTI (urinary tract infection) 07/03/2021   Hypokalemia 07/03/2021   Palliative care encounter 06/18/2021   Physical deconditioning 04/28/2021   Bandemia 04/27/2021   Severe sepsis (HCC) 04/26/2021   Essential hypertension 04/26/2021   Infection of bladder catheter (HCC) 04/25/2021   AKI on CKD-3A 04/25/2021   Hyponatremia 04/25/2021   Normocytic anemia 04/25/2021   Pancreatic mass 04/25/2021   Lab test positive for detection of COVID-19 virus 04/15/2021   Prostate cancer metastatic to multiple sites (HCC) 04/15/2021   Chronic indwelling Foley catheter 04/15/2021   Moderate late onset Alzheimer's dementia (HCC) 04/08/2021   Gait abnormality 04/08/2021   Renal insufficiency 03/12/2021   Acute lower UTI 04/30/2020   Renal mass, right 12/04/2019   LBBB (left bundle branch block) 06/07/2019   Family history of Alzheimer's disease 03/25/2018   Atherosclerotic heart disease of native coronary artery with angina pectoris (HCC) 01/15/2016   CAD-S/P PCI/DES 01/15/2016   Family history of sudden cardiac death 01/15/16   Dyslipidemia, goal LDL below  70 01/08/2016   Paresthesia 07/18/2015   S/P shoulder replacement 04/20/2015   Malignant neoplasm of prostate (HCC) 02/09/2015   Urethral stricture 07/25/2013   History of prostate cancer 08/04/2011   ED (erectile dysfunction) of organic origin 02/03/2011   Male urinary stress incontinence 01/31/2011   External hemorrhoids 08/02/2007   Incontinence of feces 08/02/2007   History of colonic polyps 08/02/2007   PCP:  Lupita Raider, MD Pharmacy:   [facility] [phone] - [place], [place] - [address] AT [address] [facility] [address] [place] [address] Phone: [phone] Fax: [phone]     Social Determinants of Health (SDOH) Social History: SDOH Screenings   Food Insecurity: Patient Declined (01/24/2023)  Transportation Needs: Patient Declined (01/24/2023)  Utilities: Patient Declined (01/24/2023)  Tobacco Use: Medium Risk (01/23/2023)   SDOH Interventions:     Readmission Risk Interventions     No data to display
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
## 2023-01-25 NOTE — Progress Notes (Signed)
TRH night cross cover note:   I was notified by RN of the patient/her family members request for resumption of outpatient tramadol, which she takes on a scheduled basis twice daily at home.  I subsequently resumed the patient's home tramadol, but on a as needed basis for now.      Newton Pigg, DO Hospitalist

## 2023-01-25 NOTE — Plan of Care (Signed)

## 2023-01-25 NOTE — Progress Notes (Signed)
Patient is refusing echo at this time, still wants to eat after being informed that he will not get his echo until tomorrow.  [facility] Tiffanie Blassingame RDCS

## 2023-01-25 NOTE — Progress Notes (Signed)
PROGRESS NOTE    Andre Casey.  ZOX:[id number] DOB: 03/24/1929 DOA: 01/23/2023 PCP: Lupita Raider, MD   Brief Narrative:  89 year old male with prostate cancer, chronic suprapubic catheter, HTN, history of ESBL UTI who comes in from home due to generalized weakness and increased frequency of falls. He was septic in the ED with fever of 101.3, tachycardic, and urinalysis positive for pyuria. He was started on meropenem and admitted to the hospital   Assessment & Plan:   Principal Problem:   Generalized weakness   Sepsis secondary to complicated UTI, POA Suprapubic catheter in place, notable fever and leukocytosis at intake ESBL Klebsiella per previous cultures, continue meropenem until recent cultures result  Generalized weakness secondary to above Moderate to severe protein caloric malnutrition Continue PT, fall precaution, discharge pending further evaluation Continue to advance diet as tolerated   Elevated troponin, not clinically relevant Troponin peak at 35 not consistent with ACS, no symptoms or complaints consistent with ACS Telemetry discontinued   AKI, prerenal in the setting of dehydration from poor oral intake Creatinine downtrending slowly, likely complicated by above, increase p.o. intake, no indication for IV fluids at this time   Hypokalemia Repleted, encourage p.o. intake   Mild hypovolemic hyponatremia Not clinically relevant, in the setting of dehydration   Mild non-anion gap metabolic acidosis in the setting of acute renal insufficiency Not clinically relevant, resolved   QTc prolongation EKG QTc 524 Avoid QTc prolonging agents  DVT prophylaxis: enoxaparin (LOVENOX) injection 30 mg Start: 01/23/23 2130   Code Status:   Code Status: Limited: Do not attempt resuscitation (DNR) -DNR-LIMITED -Do Not Intubate/DNI   Family Communication: None present  Status is: Inpatient  Dispo: The patient is from: Home              Anticipated d/c is  to: To be determined              Anticipated d/c date is: 24 to 48 hours              Patient currently not medically stable for discharge  Consultants:  None  Procedures:  None  Antimicrobials:  Cefazolin, ertapenem  Subjective: No acute issues or events overnight  Objective: Vitals:   01/25/23 0058 01/25/23 0411 01/25/23 0500 01/25/23 0532  BP:    (!) 109/54  Pulse:    78  Resp: 16 13  16   Temp:    98.5 F (36.9 C)  TempSrc:    Oral  SpO2:    96%  Weight:   59.3 kg   Height:        Intake/Output Summary (Last 24 hours) at 01/25/2023 0658 Last data filed at 01/25/2023 0610 Gross per 24 hour  Intake 1535.73 ml  Output 400 ml  Net 1135.73 ml   Filed Weights   01/23/23 1752 01/24/23 0501 01/25/23 0500  Weight: 58.3 kg 60.5 kg 59.3 kg    Examination:  General:  Pleasantly resting in bed, No acute distress. HEENT:  Normocephalic atraumatic.  Sclerae nonicteric, noninjected.  Extraocular movements intact bilaterally. Neck:  Without mass or deformity.  Trachea is midline. Lungs:  Clear to auscultate bilaterally without rhonchi, wheeze, or rales. Heart:  Regular rate and rhythm.  Without murmurs, rubs, or gallops. Abdomen:  Soft, nontender, nondistended.  Without guarding or rebound. Extremities: Without cyanosis, clubbing, edema, or obvious deformity. Skin:  Warm and dry, no erythema.  Data Reviewed: I have personally reviewed following labs and imaging studies  CBC: Recent Labs  Lab  01/23/23 1734 01/24/23 0553  WBC 13.8* 13.8*  HGB 10.3* 9.7*  HCT 31.6* 29.3*  MCV 88.5 85.4  PLT 285 250   Basic Metabolic Panel: Recent Labs  Lab 01/23/23 1734 01/24/23 0553  NA 132* 132*  K 2.8* 3.2*  CL 99 102  CO2 21* 18*  GLUCOSE 124* 107*  BUN 37* 38*  CREATININE 2.54* 2.29*  CALCIUM 8.0* 7.9*  MG  --  2.1  PHOS  --  1.6*   GFR: Estimated Creatinine Clearance: 17.6 mL/min (A) (by C-G formula based on SCr of 2.29 mg/dL (H)). Liver Function Tests: Recent  Labs  Lab 01/23/23 1734  AST 37  ALT 18  ALKPHOS 74  BILITOT 1.0  PROT 5.7*  ALBUMIN 2.2*   Recent Labs  Lab 01/23/23 1734  LIPASE 21   No results for input(s): "AMMONIA" in the last 168 hours. Coagulation Profile: No results for input(s): "INR", "PROTIME" in the last 168 hours. Cardiac Enzymes: No results for input(s): "CKTOTAL", "CKMB", "CKMBINDEX", "TROPONINI" in the last 168 hours. BNP (last 3 results) No results for input(s): "PROBNP" in the last 8760 hours. HbA1C: No results for input(s): "HGBA1C" in the last 72 hours. CBG: No results for input(s): "GLUCAP" in the last 168 hours. Lipid Profile: No results for input(s): "CHOL", "HDL", "LDLCALC", "TRIG", "CHOLHDL", "LDLDIRECT" in the last 72 hours. Thyroid Function Tests: No results for input(s): "TSH", "T4TOTAL", "FREET4", "T3FREE", "THYROIDAB" in the last 72 hours. Anemia Panel: No results for input(s): "VITAMINB12", "FOLATE", "FERRITIN", "TIBC", "IRON", "RETICCTPCT" in the last 72 hours. Sepsis Labs: Recent Labs  Lab 01/23/23 2021  LATICACIDVEN 1.0    Recent Results (from the past 240 hour(s))  Urine Culture     Status: Abnormal (Preliminary result)   Collection Time: 01/23/23  7:32 PM   Specimen: Urine, Suprapubic  Result Value Ref Range Status   Specimen Description URINE, SUPRAPUBIC  Final   Special Requests NONE  Final   Culture (A)  Final    20,000 COLONIES/mL GRAM NEGATIVE RODS CULTURE REINCUBATED FOR BETTER GROWTH IDENTIFICATION AND SUSCEPTIBILITIES TO FOLLOW Performed at [facility], [address] [place], [place] [address]    Report Status PENDING  Incomplete  Culture, blood (routine x 2)     Status: None (Preliminary result)   Collection Time: 01/23/23  9:17 PM   Specimen: BLOOD  Result Value Ref Range Status   Specimen Description BLOOD RIGHT ANTECUBITAL  Final   Special Requests   Final    BOTTLES DRAWN AEROBIC AND ANAEROBIC Blood Culture adequate volume   Culture  Setup Time    Final    GRAM POSITIVE COCCI IN BOTH AEROBIC AND ANAEROBIC BOTTLES CRITICAL RESULT CALLED TO, READ BACK BY AND VERIFIED WITH: PHARMD JESSICA MILLEN [phone] [id number] BY Berline Chough, MT Performed at [facility], [address] [place], [place] [address]    Culture GRAM POSITIVE COCCI  Final   Report Status PENDING  Incomplete  Culture, blood (routine x 2)     Status: None (Preliminary result)   Collection Time: 01/23/23  9:17 PM   Specimen: BLOOD LEFT HAND  Result Value Ref Range Status   Specimen Description BLOOD LEFT HAND  Final   Special Requests   Final    BOTTLES DRAWN AEROBIC AND ANAEROBIC Blood Culture adequate volume   Culture  Setup Time   Final    GRAM POSITIVE COCCI IN BOTH AEROBIC AND ANAEROBIC BOTTLES Performed at [facility], [address],  [place], [place] [address]    Culture GRAM POSITIVE COCCI  Final   Report Status PENDING  Incomplete  Blood Culture ID Panel (Reflexed)     Status: Abnormal   Collection Time: 01/23/23  9:17 PM  Result Value Ref Range Status   Enterococcus faecalis NOT DETECTED NOT DETECTED Final   Enterococcus Faecium NOT DETECTED NOT DETECTED Final   Listeria monocytogenes NOT DETECTED NOT DETECTED Final   Staphylococcus species DETECTED (A) NOT DETECTED Final    Comment: CRITICAL RESULT CALLED TO, READ BACK BY AND VERIFIED WITH: PHARMD JESSICA MILLEN [phone] [id number] BY J RAZZAK, MT    Staphylococcus aureus (BCID) DETECTED (A) NOT DETECTED Final    Comment: CRITICAL RESULT CALLED TO, READ BACK BY AND VERIFIED WITH: PHARMD JESSICA MILLEN [phone] [id number] BY J RAZZAK, MT    Staphylococcus epidermidis NOT DETECTED NOT DETECTED Final   Staphylococcus lugdunensis NOT DETECTED NOT DETECTED Final   Streptococcus species NOT DETECTED NOT DETECTED Final   Streptococcus agalactiae NOT DETECTED NOT DETECTED Final   Streptococcus pneumoniae NOT DETECTED NOT DETECTED Final   Streptococcus pyogenes NOT DETECTED NOT DETECTED Final    A.calcoaceticus-baumannii NOT DETECTED NOT DETECTED Final   Bacteroides fragilis NOT DETECTED NOT DETECTED Final   Enterobacterales NOT DETECTED NOT DETECTED Final   Enterobacter cloacae complex NOT DETECTED NOT DETECTED Final   Escherichia coli NOT DETECTED NOT DETECTED Final   Klebsiella aerogenes NOT DETECTED NOT DETECTED Final   Klebsiella oxytoca NOT DETECTED NOT DETECTED Final   Klebsiella pneumoniae NOT DETECTED NOT DETECTED Final   Proteus species NOT DETECTED NOT DETECTED Final   Salmonella species NOT DETECTED NOT DETECTED Final   Serratia marcescens NOT DETECTED NOT DETECTED Final   Haemophilus influenzae NOT DETECTED NOT DETECTED Final   Neisseria meningitidis NOT DETECTED NOT DETECTED Final   Pseudomonas aeruginosa NOT DETECTED NOT DETECTED Final   Stenotrophomonas maltophilia NOT DETECTED NOT DETECTED Final   Candida albicans NOT DETECTED NOT DETECTED Final   Candida auris NOT DETECTED NOT DETECTED Final   Candida glabrata NOT DETECTED NOT DETECTED Final   Candida krusei NOT DETECTED NOT DETECTED Final   Candida parapsilosis NOT DETECTED NOT DETECTED Final   Candida tropicalis NOT DETECTED NOT DETECTED Final   Cryptococcus neoformans/gattii NOT DETECTED NOT DETECTED Final   Meth resistant mecA/C and MREJ NOT DETECTED NOT DETECTED Final    Comment: Performed at [facility], [address] [place], [place] [address]         Radiology Studies: CT Head Wo Contrast  Result Date: 01/23/2023 CLINICAL DATA:  Minor head trauma. EXAM: CT HEAD WITHOUT CONTRAST TECHNIQUE: Contiguous axial images were obtained from the base of the skull through the vertex without intravenous contrast. RADIATION DOSE REDUCTION: This exam was performed according to the departmental dose-optimization program which includes automated exposure control, adjustment of the mA and/or kV according to patient size and/or use of iterative reconstruction technique. COMPARISON:  MRI brain 07/21/2015  FINDINGS: Brain: Diffuse cerebral atrophy. Ventricular dilatation consistent with central atrophy. Low-attenuation changes in the deep white matter consistent with small vessel ischemia. No abnormal extra-axial fluid collections. No mass effect or midline shift. Gray-white matter junctions are distinct. Basal cisterns are not effaced. No acute intracranial hemorrhage. Vascular: No hyperdense vessel or unexpected calcification. Skull: Normal. Negative for fracture or focal lesion. Sinuses/Orbits: No acute finding. Other: None. IMPRESSION: No acute intracranial abnormalities. Chronic atrophy and small vessel ischemic changes. Electronically Signed   By: Burman Nieves M.D.   On:  01/23/2023 21:05   DG Chest Portable 1 View  Result Date: 01/23/2023 CLINICAL DATA:  Fall this week with subsequent weakness. EXAM: PORTABLE CHEST 1 VIEW COMPARISON:  05/01/2022 FINDINGS: Slightly shallow inspiration. Linear atelectasis or infiltration in the left base. No pleural effusion. No pneumothorax. Heart size and pulmonary vascularity are normal. Mediastinal contours appear intact. Calcified and tortuous aorta. Degenerative changes in the spine and left shoulder. Postoperative change in the right shoulder. Previous resection or resorption of the left distal clavicle. Cortical irregularities in the anterolateral left seventh and eighth ribs probably represent nondisplaced fractures. IMPRESSION: Infiltration or atelectasis demonstrated in the left base. Probable nondisplaced left rib fractures. Electronically Signed   By: Burman Nieves M.D.   On: 01/23/2023 19:42        Scheduled Meds:  enoxaparin (LOVENOX) injection  30 mg Subcutaneous Q24H   ertapenem  1 g Intramuscular Q24H   Continuous Infusions:   LOS: 2 days   Time spent:  Azucena Fallen, DO [facility]  If 7PM-7AM, please contact night-coverage [email or web address]  01/25/2023, 6:58 AM

## 2023-01-25 NOTE — Progress Notes (Signed)
Pharmacy Antibiotic Note  Koren Iaquinto. is a 89 y.o. male admitted on 01/23/2023 with MSSA bacteremia and GNR UTI. Pharmacy has been consulted for cefazolin dosing.  Plan: Ertapenem 500 mg IV every 24 hours - per MD for history for ESBL UTI Cefazolin 2 gm IV every 12 hours Monitor culture results, susceptibilities, and clinical progression F/u LOT  Height: 5\' 7"  (170.2 cm) Weight: 59.3 kg (130 lb 11.7 oz) IBW/kg (Calculated) : 66.1  Temp (24hrs), Avg:98.4 F (36.9 C), Min:97.5 F (36.4 C), Max:99.6 F (37.6 C)  Recent Labs  Lab 01/23/23 1734 01/23/23 2021 01/24/23 0553  WBC 13.8*  --  13.8*  CREATININE 2.54*  --  2.29*  LATICACIDVEN  --  1.0  --     Estimated Creatinine Clearance: 17.6 mL/min (A) (by C-G formula based on SCr of 2.29 mg/dL (H)).    Allergies  Allergen Reactions   Aricept [Donepezil] Nausea Only    Report upset stomach - unable to tolerate.   Namzaric [Memantine Hcl-Donepezil Hcl] Other (See Comments)    Stomach upset  Pt able to take Memantine by itself.    Antimicrobials this admission: Meropenem 11/29 >> 11/30 Ertapenem 12/1 >>  Cefazolin 12/1 >>  Microbiology results: 11/29 BCx: MSSA 3/4 bottles 11/29 UCx: 20,000 GNRs  Thank you for allowing pharmacy to be a part of this patient's care.  Enos Fling, PharmD PGY-1 Acute Care Pharmacy Resident 01/25/2023 8:09 AM

## 2023-01-26 ENCOUNTER — Inpatient Hospital Stay (HOSPITAL_COMMUNITY): Payer: Medicare HMO

## 2023-01-26 DIAGNOSIS — R531 Weakness: Secondary | ICD-10-CM | POA: Diagnosis not present

## 2023-01-26 DIAGNOSIS — I38 Endocarditis, valve unspecified: Secondary | ICD-10-CM | POA: Diagnosis not present

## 2023-01-26 LAB — ECHOCARDIOGRAM COMPLETE
AR max vel: 1.78 cm2
AV Area VTI: 1.67 cm2
AV Area mean vel: 2.05 cm2
AV Mean grad: 3 mm[Hg]
AV Peak grad: 5.2 mm[Hg]
Ao pk vel: 1.14 m/s
Area-P 1/2: 3.1 cm2
Calc EF: 67.8 %
Height: 67 in
MV VTI: 1.98 cm2
S' Lateral: 2.9 cm
Single Plane A2C EF: 71.7 %
Single Plane A4C EF: 56.2 %
Weight: 2052.92 [oz_av]

## 2023-01-26 LAB — CULTURE, BLOOD (ROUTINE X 2)
Special Requests: ADEQUATE
Special Requests: ADEQUATE

## 2023-01-26 LAB — CBC
HCT: 32.7 % — ABNORMAL LOW (ref 39.0–52.0)
Hemoglobin: 10.4 g/dL — ABNORMAL LOW (ref 13.0–17.0)
MCH: 27.8 pg (ref 26.0–34.0)
MCHC: 31.8 g/dL (ref 30.0–36.0)
MCV: 87.4 fL (ref 80.0–100.0)
Platelets: 318 10*3/uL (ref 150–400)
RBC: 3.74 MIL/uL — ABNORMAL LOW (ref 4.22–5.81)
RDW: 14.6 % (ref 11.5–15.5)
WBC: 13.5 10*3/uL — ABNORMAL HIGH (ref 4.0–10.5)
nRBC: 0 % (ref 0.0–0.2)

## 2023-01-26 LAB — BASIC METABOLIC PANEL
Anion gap: 7 (ref 5–15)
BUN: 26 mg/dL — ABNORMAL HIGH (ref 8–23)
CO2: 19 mmol/L — ABNORMAL LOW (ref 22–32)
Calcium: 8.1 mg/dL — ABNORMAL LOW (ref 8.9–10.3)
Chloride: 109 mmol/L (ref 98–111)
Creatinine, Ser: 1.36 mg/dL — ABNORMAL HIGH (ref 0.61–1.24)
GFR, Estimated: 49 mL/min — ABNORMAL LOW (ref >60)
Glucose, Bld: 156 mg/dL — ABNORMAL HIGH (ref 70–99)
Potassium: 3.6 mmol/L (ref 3.5–5.1)
Sodium: 135 mmol/L (ref 135–145)

## 2023-01-26 LAB — URINE CULTURE: Culture: 20000 — AB

## 2023-01-26 MED ORDER — BUPIVACAINE HCL (PF) 0.5 % IJ SOLN
10.0000 mL | Freq: Once | INTRAMUSCULAR | Status: DC
  Filled 2023-01-26: qty 10

## 2023-01-26 NOTE — Plan of Care (Signed)

## 2023-01-26 NOTE — TOC Progression Note (Signed)
Transition of Care (TOC) - Progression Note    Patient Details  Name: Andre Casey. MRN: [id number] Date of Birth: January 01, 1930  Transition of Care [facility]) CM/SW Contact  Youssef Footman A [place], [place] Phone Number: 01/26/2023, 11:15 AM  Clinical Narrative:     CSW met with pt at bedside and provided bed offers to pt. CSW also contacted pt's daughter Hale Bogus to provide bed offers, she requested CSW follow up with Rosalita Chessman as she is currently traveling. CSW contacted Rosalita Chessman, left VM as there was no answer. CSW will follow up at another more opportune time.    TOC will continue to follow.   Expected Discharge Plan: Skilled Nursing Facility Barriers to Discharge: Continued Medical Work up, English as a second language teacher, SNF Pending bed offer  Expected Discharge Plan and Services In-house Referral: Clinical Social Work                                             Social Determinants of Health (SDOH) Interventions SDOH Screenings   Food Insecurity: Patient Declined (01/24/2023)  Transportation Needs: Patient Declined (01/24/2023)  Utilities: Patient Declined (01/24/2023)  Tobacco Use: Medium Risk (01/23/2023)    Readmission Risk Interventions     No data to display

## 2023-01-26 NOTE — Procedures (Signed)
Procedure: Right shoulder aspiration and injection   Indication: Right shoulder pain   Surgeon: Charma Igo, PA-C   Assist: None   Anesthesia: Topical refrigerant   EBL: None   Complications: Dry tap   Findings: After risks/benefits explained patient desires to undergo procedure. Consent obtained and time out performed. The right shoulder was sterilely prepped and aspirated. No fluid encountered. 6ml 0.5% Marcaine instilled. Pt tolerated the procedure well.       Freeman Caldron, PA-C Orthopedic Surgery [phone]
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
## 2023-01-26 NOTE — Progress Notes (Addendum)
Mobility Specialist Progress Note:    01/26/23 1500  Mobility  Activity Ambulated with assistance to bathroom  Level of Assistance Minimal assist, patient does 75% or more  Assistive Device Front wheel walker  Distance Ambulated (ft) 12 ft  Activity Response Tolerated well  Mobility Referral Yes  $Mobility charge 1 Mobility  Mobility Specialist Start Time (ACUTE ONLY) 1450  Mobility Specialist Stop Time (ACUTE ONLY) 1500  Mobility Specialist Time Calculation (min) (ACUTE ONLY) 10 min   Pt received in bed, responded to bed alarm, requesting assistance to bathroom, void unsuccessful. MinA required to stand with RW. Encouraged pt to pull call light when finished. MinA to stand from toilet, ambulated back to bed. Pt had sever SOB and wheezing, fatigued quickly, SpO2 97% on RA. Adjusted pt comfortably in bed, alarm on, call bell in reach, all needs met.   Feliciana Rossetti Mobility Specialist Please contact via Special educational needs teacher or  Rehab office at [phone]

## 2023-01-26 NOTE — Progress Notes (Signed)
  Echocardiogram 2D Echocardiogram has been performed.  Ocie Doyne RDCS 01/26/2023, 8:25 AM

## 2023-01-26 NOTE — Progress Notes (Signed)
Physical Therapy Treatment Patient Details Name: Andre Casey. MRN: [id number] DOB: [birth year] Today's Date: 01/26/2023   History of Present Illness 89 year old male who comes in from home due to generalized weakness and increased frequency of falls.  Urinalysis was positive for pyuria with increased fevers.  History of prostate cancer, chronic suprapubic catheter, HTN, history of ESBL UTI, CAD.    PT Comments  Patient alert and cooperative. Patient able to participate in LE exercises and deferred UE exercises due to incr Rt shoulder pain. Dr. Natale Milch in during session and reports orthopedics is to see patient due to ?joint infection related to sepsis. Patient able to stand x 3 reps with CGA, including transfer on/off BSC with pt having small bowel movement. He required [facility] as soon as he stood the first time and reported recent bowel incontinence and normally wears a brief to contain this. Recommend brief for attempts to ambulate.     If plan is discharge home, recommend the following: A little help with walking and/or transfers;A little help with bathing/dressing/bathroom;Assistance with cooking/housework;Direct supervision/assist for medications management;Assist for transportation;Supervision due to cognitive status;Help with stairs or ramp for entrance   Can travel by private vehicle     No  Equipment Recommendations       Recommendations for Other Services       Precautions / Restrictions Precautions Precautions: Fall Restrictions Weight Bearing Restrictions: No     Mobility  Bed Mobility Overal bed mobility: Needs Assistance Bed Mobility: Supine to Sit, Sit to Supine     Supine to sit: HOB elevated, Min assist Sit to supine: Min assist   General bed mobility comments: due to rt shoulder pain, exit bed to his left and required light min assist to raise torso; heavier min assist to raise legs up onto bed on return    Transfers Overall transfer level: Needs  assistance Equipment used: 1 person hand held assist Transfers: Sit to/from Stand, Bed to chair/wheelchair/BSC Sit to Stand: Contact guard assist   Step pivot transfers: Contact guard assist       General transfer comment: on initial sit to stand,  pt noted he has bowel incontinence and needed to use the BSC; returned to sit EOB and BSC brought to bedside; on/off BSC with CGA with only mild dyspnea    Ambulation/Gait               General Gait Details: Deferred due to bowel incontinence and no brief   Stairs             Wheelchair Mobility     Tilt Bed    Modified Rankin (Stroke Patients Only)       Balance Overall balance assessment: Needs assistance Sitting-balance support: Feet supported, No upper extremity supported Sitting balance-Leahy Scale: Fair     Standing balance support: Bilateral upper extremity supported, During functional activity Standing balance-Leahy Scale: Poor Standing balance comment: Pt needs UE support for balance                            Cognition Arousal: Alert Behavior During Therapy: WFL for tasks assessed/performed Overall Cognitive Status: No family/caregiver present to determine baseline cognitive functioning                                 General Comments: Pt pleasant and cooperative.  Oriented to person, place "hospital," and time "December  2024"        Exercises Low Level/ICU Exercises Ankle Circles/Pumps: AROM, Both, 10 reps Heel Slides: AROM, Both, 5 reps, Supine Stabilized Bridging: AROM, Both, 5 reps    General Comments        Pertinent Vitals/Pain Pain Assessment Pain Assessment: Faces Faces Pain Scale: Hurts little more Pain Location: rt shoulder Pain Descriptors / Indicators: Discomfort Pain Intervention(s): Limited activity within patient's tolerance, Monitored during session    Home Living                          Prior Function            PT Goals  (current goals can now be found in the care plan section) Acute Rehab PT Goals Patient Stated Goal: to get better Time For Goal Achievement: 02/07/23 Potential to Achieve Goals: Good Progress towards PT goals: Progressing toward goals    Frequency    Min 1X/week      PT Plan      Co-evaluation              AM-PAC PT "6 Clicks" Mobility   Outcome Measure  Help needed turning from your back to your side while in a flat bed without using bedrails?: A Little Help needed moving from lying on your back to sitting on the side of a flat bed without using bedrails?: A Little Help needed moving to and from a bed to a chair (including a wheelchair)?: A Little Help needed standing up from a chair using your arms (e.g., wheelchair or bedside chair)?: A Little Help needed to walk in hospital room?: A Lot Help needed climbing 3-5 steps with a railing? : A Lot 6 Click Score: 16    End of Session   Activity Tolerance: Patient tolerated treatment well Patient left: in bed;with call bell/phone within reach;with bed alarm set;with family/visitor present Nurse Communication: Mobility status PT Visit Diagnosis: Other abnormalities of gait and mobility (R26.89)     Time: 7829-5621 PT Time Calculation (min) (ACUTE ONLY): 35 min  Charges:    $Gait Training: 8-22 mins $Therapeutic Exercise: 8-22 mins PT General Charges $$ ACUTE PT VISIT: 1 Visit                      Jerolyn Center, PT Acute Rehabilitation Services  Office [phone]    Zena Amos 01/26/2023, 1:30 PM

## 2023-01-26 NOTE — Progress Notes (Signed)
PROGRESS NOTE    Andre Casey.  WUJ:[id number] DOB: 09-06-1929 DOA: 01/23/2023 PCP: Lupita Raider, MD  Brief Narrative:  89 year old male who presents from home (where he lives alone) with prostate cancer, chronic suprapubic catheter, HTN, history of ESBL UTI who comes in from home due to generalized weakness and increased frequency of falls. He was septic in the ED with fever of 101.3, tachycardic, and urinalysis positive for pyuria. He was started on meropenem and admitted to the hospital.  Assessment & Plan: Principal Problem:   Generalized weakness  Sepsis secondary to complicated UTI, POA Concurrent bacteremia Rule out septic joint (prosthetic, R shoulder) -Suprapubic catheter in place, notable fever and leukocytosis at intake -ESBL Klebsiella per previous cultures, consistent with current cultures, staff aureus blood cultures returned positive x 2 -DC ertapenem, continue cefazolin - 5 per sensitivities -TTE pending -CT chest abdomen pelvis and face without overt or clear infectious source -Plan for right shoulder aspiration per Ortho given prosthetic and increased pain -Repeat blood cultures to ensure clearance  Renal mass, unspecified  -Initially noted at [facility] in October, appears stable on repeat imaging here -Plan at that time was to follow-up with outpatient urology and oncology - unclear if this has been done  Generalized weakness secondary to above Moderate to severe protein caloric malnutrition Continue PT, fall precaution, discharge pending further evaluation Continue to advance diet as tolerated   Elevated troponin, not clinically relevant Troponin peak at 35 not consistent with ACS, no symptoms or complaints consistent with ACS Telemetry discontinued   AKI, prerenal in the setting of dehydration from poor oral intake Creatinine downtrending slowly, likely complicated by above, increase p.o. intake, no indication for IV fluids at this time    Hypokalemia Repleted, encourage p.o. intake   Mild hypovolemic hyponatremia Not clinically relevant, in the setting of dehydration   Mild non-anion gap metabolic acidosis in the setting of acute renal insufficiency Not clinically relevant, resolved   QTc prolongation EKG QTc 524 Avoid QTc prolonging agents  DVT prophylaxis: enoxaparin (LOVENOX) injection 30 mg Start: 01/23/23 2130 Code Status:   Code Status: Limited: Do not attempt resuscitation (DNR) -DNR-LIMITED -Do Not Intubate/DNI  Family Communication: Daughter updated over phone  Status is: Inpatient  Dispo: The patient is from: Home              Anticipated d/c is to: To be determined              Anticipated d/c date is: 24 to 48 hours              Patient currently not medically stable for discharge  Consultants:  None  Procedures:  None  Antimicrobials:  Cefazolin  Subjective: No acute issues or events overnight  Objective: Vitals:   01/25/23 2137 01/25/23 2317 01/25/23 2319 01/26/23 0542  BP:   (!) 154/89 110/71  Pulse:   98 79  Resp: (!) 30 20 17    Temp:   98.4 F (36.9 C) 98.3 F (36.8 C)  TempSrc:   Oral Oral  SpO2:   95% 95%  Weight:    58.2 kg  Height:        Intake/Output Summary (Last 24 hours) at 01/26/2023 9562 Last data filed at 01/26/2023 0600 Gross per 24 hour  Intake 1090 ml  Output 1850 ml  Net -760 ml   Filed Weights   01/24/23 0501 01/25/23 0500 01/26/23 0542  Weight: 60.5 kg 59.3 kg 58.2 kg    Examination:  General:  Pleasantly resting in bed, No acute distress. HEENT:  Normocephalic atraumatic.  Sclerae nonicteric, noninjected.  Extraocular movements intact bilaterally. Neck:  Without mass or deformity.  Trachea is midline. Lungs:  Clear to auscultate bilaterally without rhonchi, wheeze, or rales. Heart:  Regular rate and rhythm.  Without murmurs, rubs, or gallops. Abdomen:  Soft, nontender, nondistended.  Without guarding or rebound. Extremities: Without cyanosis,  clubbing, edema, or obvious deformity. Skin:  Warm and dry, no erythema.  Data Reviewed: I have personally reviewed following labs and imaging studies  CBC: Recent Labs  Lab 01/23/23 1734 01/24/23 0553 01/26/23 0428  WBC 13.8* 13.8* 13.5*  HGB 10.3* 9.7* 10.4*  HCT 31.6* 29.3* 32.7*  MCV 88.5 85.4 87.4  PLT 285 250 318   Basic Metabolic Panel: Recent Labs  Lab 01/23/23 1734 01/24/23 0553 01/25/23 0837 01/26/23 0428  NA 132* 132* 135 135  K 2.8* 3.2* 3.2* 3.6  CL 99 102 103 109  CO2 21* 18* 17* 19*  GLUCOSE 124* 107* 103* 156*  BUN 37* 38* 28* 26*  CREATININE 2.54* 2.29* 1.83* 1.36*  CALCIUM 8.0* 7.9* 8.2* 8.1*  MG  --  2.1  --   --   PHOS  --  1.6*  --   --    GFR: Estimated Creatinine Clearance: 29.1 mL/min (A) (by C-G formula based on SCr of 1.36 mg/dL (H)). Liver Function Tests: Recent Labs  Lab 01/23/23 1734  AST 37  ALT 18  ALKPHOS 74  BILITOT 1.0  PROT 5.7*  ALBUMIN 2.2*   Recent Labs  Lab 01/23/23 1734  LIPASE 21   No results for input(s): "AMMONIA" in the last 168 hours. Coagulation Profile: No results for input(s): "INR", "PROTIME" in the last 168 hours. Cardiac Enzymes: No results for input(s): "CKTOTAL", "CKMB", "CKMBINDEX", "TROPONINI" in the last 168 hours. BNP (last 3 results) No results for input(s): "PROBNP" in the last 8760 hours. HbA1C: No results for input(s): "HGBA1C" in the last 72 hours. CBG: No results for input(s): "GLUCAP" in the last 168 hours. Lipid Profile: No results for input(s): "CHOL", "HDL", "LDLCALC", "TRIG", "CHOLHDL", "LDLDIRECT" in the last 72 hours. Thyroid Function Tests: No results for input(s): "TSH", "T4TOTAL", "FREET4", "T3FREE", "THYROIDAB" in the last 72 hours. Anemia Panel: No results for input(s): "VITAMINB12", "FOLATE", "FERRITIN", "TIBC", "IRON", "RETICCTPCT" in the last 72 hours. Sepsis Labs: Recent Labs  Lab 01/23/23 2021  LATICACIDVEN 1.0    Recent Results (from the past 240 hour(s))   Urine Culture     Status: Abnormal (Preliminary result)   Collection Time: 01/23/23  7:32 PM   Specimen: Urine, Suprapubic  Result Value Ref Range Status   Specimen Description URINE, SUPRAPUBIC  Final   Special Requests   Final    NONE Performed at [facility], [address], [place], [place] [address]    Culture (A)  Final    20,000 COLONIES/mL KLEBSIELLA PNEUMONIAE >=100,000 COLONIES/mL STAPHYLOCOCCUS AUREUS AMONG MIXED ORGANISMS SUSCEPTIBILITIES TO FOLLOW STAPHYLOCOCCUS AUREUS    Report Status PENDING  Incomplete   Organism ID, Bacteria KLEBSIELLA PNEUMONIAE (A)  Final      Susceptibility   Klebsiella pneumoniae - MIC*    AMPICILLIN RESISTANT Resistant     CEFAZOLIN <=4 SENSITIVE Sensitive     CEFEPIME <=0.12 SENSITIVE Sensitive     CEFTRIAXONE <=0.25 SENSITIVE Sensitive     CIPROFLOXACIN <=0.25 SENSITIVE Sensitive     GENTAMICIN <=1 SENSITIVE Sensitive     IMIPENEM <=0.25 SENSITIVE Sensitive     NITROFURANTOIN  64 INTERMEDIATE Intermediate     TRIMETH/SULFA <=20 SENSITIVE Sensitive     AMPICILLIN/SULBACTAM 4 SENSITIVE Sensitive     PIP/TAZO <=4 SENSITIVE Sensitive ug/mL    * 20,000 COLONIES/mL KLEBSIELLA PNEUMONIAE  Culture, blood (routine x 2)     Status: Abnormal (Preliminary result)   Collection Time: 01/23/23  9:17 PM   Specimen: BLOOD  Result Value Ref Range Status   Specimen Description BLOOD RIGHT ANTECUBITAL  Final   Special Requests   Final    BOTTLES DRAWN AEROBIC AND ANAEROBIC Blood Culture adequate volume   Culture  Setup Time   Final    GRAM POSITIVE COCCI IN BOTH AEROBIC AND ANAEROBIC BOTTLES CRITICAL RESULT CALLED TO, READ BACK BY AND VERIFIED WITH: PHARMD JESSICA MILLEN [id number] BY J RAZZAK, [place]    Culture (A)  Final    STAPHYLOCOCCUS AUREUS SUSCEPTIBILITIES TO FOLLOW Performed at [facility], [address], [place], [place] [address]    Report Status PENDING  Incomplete  Culture, blood (routine x 2)     Status: Abnormal  (Preliminary result)   Collection Time: 01/23/23  9:17 PM   Specimen: BLOOD LEFT HAND  Result Value Ref Range Status   Specimen Description BLOOD LEFT HAND  Final   Special Requests   Final    BOTTLES DRAWN AEROBIC AND ANAEROBIC Blood Culture adequate volume   Culture  Setup Time   Final    GRAM POSITIVE COCCI IN BOTH AEROBIC AND ANAEROBIC BOTTLES Performed at [facility], [address], [place], [place] [address]    Culture STAPHYLOCOCCUS AUREUS (A)  Final   Report Status PENDING  Incomplete  Blood Culture ID Panel (Reflexed)     Status: Abnormal   Collection Time: 01/23/23  9:17 PM  Result Value Ref Range Status   Enterococcus faecalis NOT DETECTED NOT DETECTED Final   Enterococcus Faecium NOT DETECTED NOT DETECTED Final   Listeria monocytogenes NOT DETECTED NOT DETECTED Final   Staphylococcus species DETECTED (A) NOT DETECTED Final    Comment: CRITICAL RESULT CALLED TO, READ BACK BY AND VERIFIED WITH: PHARMD JESSICA MILLEN [id number] [id number] BY J RAZZAK, MT    Staphylococcus aureus (BCID) DETECTED (A) NOT DETECTED Final    Comment: CRITICAL RESULT CALLED TO, READ BACK BY AND VERIFIED WITH: PHARMD JESSICA MILLEN [id number] [id number] BY J RAZZAK, MT    Staphylococcus epidermidis NOT DETECTED NOT DETECTED Final   Staphylococcus lugdunensis NOT DETECTED NOT DETECTED Final   Streptococcus species NOT DETECTED NOT DETECTED Final   Streptococcus agalactiae NOT DETECTED NOT DETECTED Final   Streptococcus pneumoniae NOT DETECTED NOT DETECTED Final   Streptococcus pyogenes NOT DETECTED NOT DETECTED Final   A.calcoaceticus-baumannii NOT DETECTED NOT DETECTED Final   Bacteroides fragilis NOT DETECTED NOT DETECTED Final   Enterobacterales NOT DETECTED NOT DETECTED Final   Enterobacter cloacae complex NOT DETECTED NOT DETECTED Final   Escherichia coli NOT DETECTED NOT DETECTED Final   Klebsiella aerogenes NOT DETECTED NOT DETECTED Final   Klebsiella oxytoca NOT DETECTED NOT DETECTED Final    Klebsiella pneumoniae NOT DETECTED NOT DETECTED Final   Proteus species NOT DETECTED NOT DETECTED Final   Salmonella species NOT DETECTED NOT DETECTED Final   Serratia marcescens NOT DETECTED NOT DETECTED Final   Haemophilus influenzae NOT DETECTED NOT DETECTED Final   Neisseria meningitidis NOT DETECTED NOT DETECTED Final   Pseudomonas aeruginosa NOT DETECTED NOT DETECTED Final   Stenotrophomonas maltophilia NOT DETECTED NOT DETECTED Final   Candida albicans NOT DETECTED  NOT DETECTED Final   Candida auris NOT DETECTED NOT DETECTED Final   Candida glabrata NOT DETECTED NOT DETECTED Final   Candida krusei NOT DETECTED NOT DETECTED Final   Candida parapsilosis NOT DETECTED NOT DETECTED Final   Candida tropicalis NOT DETECTED NOT DETECTED Final   Cryptococcus neoformans/gattii NOT DETECTED NOT DETECTED Final   Meth resistant mecA/C and MREJ NOT DETECTED NOT DETECTED Final    Comment: Performed at [facility], [address], [place], [place] [address]         Radiology Studies: CT CHEST ABDOMEN PELVIS WO CONTRAST  Result Date: 01/26/2023 CLINICAL DATA:  Systemic infection. EXAM: CT CHEST, ABDOMEN AND PELVIS WITHOUT CONTRAST TECHNIQUE: Multidetector CT imaging of the chest, abdomen and pelvis was performed following the standard protocol without IV contrast. RADIATION DOSE REDUCTION: This exam was performed according to the departmental dose-optimization program which includes automated exposure control, adjustment of the mA and/or kV according to patient size and/or use of iterative reconstruction technique. COMPARISON:  CTA chest 01/14/2019, CT abdomen and pelvis without contrast 07/30/2021, CT abdomen pelvis with contrast 07/03/2021. FINDINGS: CT CHEST FINDINGS Cardiovascular: Normal caliber the pulmonary arteries and veins. Normal cardiac size with small chronic anterior pericardial effusion. Single-vessel calcifications and prior stenting LAD coronary artery. Aortic atherosclerosis  and tortuosity without aneurysm, with normal great vessel branching. Mediastinum/Nodes: No enlarged mediastinal, hilar, or axillary lymph nodes. Thyroid gland, trachea, and esophagus demonstrate no significant findings. Both main bronchi are clear. Lungs/Pleura: Paraseptal and centrilobular emphysematous changes. Minimal bilateral layering pleural effusions slightly greater fluid on the left. Mild bronchial thickening noted left upper and both lower lobes. Calcified granuloma left apex. Mild perifissural atelectasis is seen in both upper lobes but no active infiltrates. Mild chronic elevation right hemidiaphragm. The lungs are otherwise clear. Musculoskeletal: Spray artifact from old right shoulder arthroplasty. Chronic resection distal left clavicle and left acromiohumeral abutment consistent with chronic rotator cuff arthropathy. Osteopenia and degenerative change thoracic spine. No acute or other significant osseous findings or chest wall mass. CT ABDOMEN PELVIS FINDINGS Hepatobiliary: The liver is unremarkable without contrast. There is mild gallbladder dilatation without wall thickening, calcified stones or biliary dilatation. Pancreas: Hypodense pancreatic head lesion (Hounsfield density of 28) again noted anteriorly, measuring 2.9 x 2.5 cm, previously 2.4 x 2 cm on the last CT and has been described and reported previously. Again this abuts the anterior and right lateral wall of the SMV. Per report of an MRI from 02/16/2019 this has been slowly growing since 2007, and was thought to be a cystic lesion by MRI appearance compatible with a pseudocyst or side branch IPMN. There is no upstream pancreatic ductal dilatation, no inflammatory changes and no other masslike contour deformity. Spleen: No abnormality. Adrenals/Urinary Tract: There is no adrenal mass. Right renal mass slightly hyperdense to cortex again is noted in the interpolar area measuring 2.9 x 1.9 cm on 3:73, on the last CT similar in size, and on  previous imaging noted consistent with a renal cell carcinoma. There is no new contour deforming abnormality of either kidney. On the left, there is mild-to-moderate new left hydroureteronephrosis. Perinephric edema. Correlate clinically for infectious complication. There are no intrarenal stones currently or previously. Etiology of the findings of obstructive uropathy is not identified as the most distal left ureter and the UVJ are obscured by numerous pelvic surgical clips. Any number of obstructive etiologies could be present, from interval new stone disease to a distal ureteral stricture or mass. The bladder is contracted  around a suprapubic catheter balloon is not well evaluated, also mostly obscured by spray artifact from the surgical clips. Suprapubic catheter was also in place on both prior studies from last year. Stomach/Bowel: No dilatation, wall thickening or inflammatory change. This includes the appendix. Small hiatal hernia. Colonic diverticulosis noted heaviest in the sigmoid segment without acute diverticulitis. Vascular/Lymphatic: Aortic atherosclerosis. No enlarged abdominal or pelvic lymph nodes. There previously was a mildly prominent left inguinal chain nodes which is not seen today. There previously was a slightly prominent retroaortic lymph node which is also no longer seen. Reproductive: Radical prostatectomy. Allowing for spray artifact from surgical clips no recurrent mass is seen in the prostate bed. Surgical clips continue up the pelvic sidewalls consistent with lymph node dissection. Other: Small volume of posterior deep pelvic ascites. Not seen previously. There is no free hemorrhage, free air or incarcerated hernia, no focal inflammatory process. Musculoskeletal: Osteopenia and advanced degenerative change lumbar spine with mild levoscoliosis. L4 previously demonstrated a densely sclerotic vertebral body lesion, where currently there is trabecular coarsening and scattered cystic spaces  in the vertebral body. 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 No other focal bone lesion is seen. IMPRESSION: 1. New mild-to-moderate left hydroureteronephrosis with perinephric edema. Correlate clinically for infectious complication. Etiology of the obstructive uropathy is not identified as the most distal left ureter and the UVJ are obscured by numerous pelvic surgical clips. Any number of obstructive etiologies could be present, from interval new stone disease to a distal ureteral stricture or mass. 2. No intrarenal stones currently or previously. 3. 2.9 x 1.9 cm right renal mass consistent with a renal cell carcinoma, similar in size to the last CT. 4. Radical prostatectomy with no evidence of recurrent mass in the prostate bed. 5. Small volume of posterior deep pelvic ascites, new. 6. Minimal bilateral pleural effusions. 7. Emphysema. 8. 2.9 x 2.5 cm hypodense pancreatic head lesion, previously 2.4 x 2 cm on the last CT and has been described and reported previously. Per report of an MRI from 02/16/2019 this has been slowly growing since 2007, and was thought to be a cystic lesion by MRI appearance compatible with a pseudocyst or side branch IPMN. 9. Aortic and coronary artery atherosclerosis. 10. L4 previously demonstrated a densely sclerotic vertebral body lesion, where currently there is trabecular coarsening and scattered cystic spaces in the vertebral body. 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 11. Mild gallbladder dilatation without wall thickening, calcified stones or biliary dilatation. 12. Small hiatal hernia. 13. Colonic diverticulosis without evidence of acute diverticulitis. Aortic Atherosclerosis (ICD10-I70.0) and Emphysema (ICD10-J43.9). Electronically Signed   By: Almira Bar M.D.   On: 01/26/2023 00:18   CT MAXILLOFACIAL WO CONTRAST  Result Date: 01/25/2023 CLINICAL DATA:  Complicated UTI, recent falls. EXAM: CT MAXILLOFACIAL WITHOUT CONTRAST TECHNIQUE: Multidetector CT imaging of  the maxillofacial structures was performed. Multiplanar CT image reconstructions were also generated. RADIATION DOSE REDUCTION: This exam was performed according to the departmental dose-optimization program which includes automated exposure control, adjustment of the mA and/or kV according to patient size and/or use of iterative reconstruction technique. COMPARISON:  CT neck 04/15/2021, MRI head 07/21/2015, CT head 01/22/2022 FINDINGS: Osseous: No fracture or mandibular dislocation. No destructive process. Bilateral temporomandibular joint degenerative changes. Orbits: Similar-appearing bilateral enophthalmos. No traumatic or inflammatory finding. Sinuses: Clear. Soft tissues: Negative. Limited intracranial: No significant or unexpected finding. Other: Degenerative changes of the spine with grade 1 anterolisthesis of C3 on C4 and C4 on C5. Mild retrolisthesis of C5  on C6 and C6 on C7. IMPRESSION: No acute displaced facial fracture. Electronically Signed   By: Tish Frederickson M.D.   On: 01/25/2023 23:36   DG Shoulder Right  Result Date: 01/25/2023 CLINICAL DATA:  Generalized weakness and right shoulder pain for a few days. EXAM: RIGHT SHOULDER - 3 VIEW COMPARISON:  X-ray 04/20/2015. FINDINGS: Osteopenia. Reverse shoulder arthroplasty again identified. Stable alignment. No hardware failure. Press-Fit humeral component and screw fixated glenoid component. Hypertrophic degenerative changes of the [facility] joint. Global osteopenia. No fracture or dislocation. No evidence of hardware failure. IMPRESSION: Reverse shoulder arthroplasty.  Degenerative changes and osteopenia. Electronically Signed   By: Karen Kays M.D.   On: 01/25/2023 18:07        Scheduled Meds:  diclofenac Sodium  4 g Topical QID   enoxaparin (LOVENOX) injection  30 mg Subcutaneous Q24H   feeding supplement  237 mL Oral BID BM   Continuous Infusions:   ceFAZolin (ANCEF) IV Stopped (01/25/23 2211)     LOS: 3 days   Time spent:   Azucena Fallen, DO [facility]  If 7PM-7AM, please contact night-coverage [email or web address]  01/26/2023, 7:22 AM

## 2023-01-27 DIAGNOSIS — R7881 Bacteremia: Secondary | ICD-10-CM | POA: Diagnosis not present

## 2023-01-27 DIAGNOSIS — B9561 Methicillin susceptible Staphylococcus aureus infection as the cause of diseases classified elsewhere: Secondary | ICD-10-CM

## 2023-01-27 DIAGNOSIS — R531 Weakness: Secondary | ICD-10-CM | POA: Diagnosis not present

## 2023-01-27 NOTE — Progress Notes (Signed)
Occupational Therapy Treatment Patient Details Name: Andre Casey. MRN: [id number] DOB: October 07, 1929 Today's Date: 01/27/2023   History of present illness 89 year old male who comes in from home due to generalized weakness and increased frequency of falls.  Urinalysis was positive for pyuria with increased fevers.  History of prostate cancer, chronic suprapubic catheter, HTN, history of ESBL UTI, CAD.   OT comments  Pt still with increased confusion but able to easily re-direct to follow commands and participate in selfcare tasks.  Min assist for bed mobility, bathing, and toileting with use of the RW for support.  Will continue to benefit from acute care OT at this time to help progress  independence with selfcare tasks, toileting, functional transfers, and cognition.  Recommend continued inpatient follow up therapy, <3 hours/day post acute stay as pt lives alone and has no consistent assist.        If plan is discharge home, recommend the following:  A little help with walking and/or transfers;A little help with bathing/dressing/bathroom;Assistance with cooking/housework;Assist for transportation;Help with stairs or ramp for entrance   Equipment Recommendations  Other (comment) (TBD next venue of care)       Precautions / Restrictions Precautions Precautions: Fall Restrictions Weight Bearing Restrictions: No       Mobility Bed Mobility Overal bed mobility: Needs Assistance Bed Mobility: Supine to Sit, Sit to Supine     Supine to sit: HOB elevated, Min assist Sit to supine: Min assist   General bed mobility comments: Min assist with bringing trunk up to sitting and scooting right hip forward when sitting up.  Min assist for bringing LEs back in the bed with transition to supine.    Transfers Overall transfer level: Needs assistance Equipment used: Rolling walker (2 wheels) Transfers: Sit to/from Stand, Bed to chair/wheelchair/BSC Sit to Stand: Min assist      Step pivot transfers: Min assist     General transfer comment: Mod demonstrational cueing for hand placement with sit to stand.     Balance Overall balance assessment: Needs assistance Sitting-balance support: Feet supported, No upper extremity supported Sitting balance-Leahy Scale: Fair Sitting balance - Comments: Pt able to maintain balance while completing UB bathing tasks EOB   Standing balance support: Bilateral upper extremity supported, During functional activity Standing balance-Leahy Scale: Poor Standing balance comment: Pt needs use of the RW for support in standing.                           ADL either performed or assessed with clinical judgement   ADL Overall ADL's : Needs assistance/impaired     Grooming: Wash/dry hands;Wash/dry face;Sitting;Set up   Upper Body Bathing: Supervision/ safety;Sitting Upper Body Bathing Details (indicate cue type and reason): EOB unsupported Lower Body Bathing: Minimal assistance;Sit to/from stand   Upper Body Dressing : Minimal assistance;Sitting Upper Body Dressing Details (indicate cue type and reason): hospital gown     Toilet Transfer: Minimal assistance;Ambulation;Rolling walker (2 wheels);BSC/3in1   Toileting- Clothing Manipulation and Hygiene: Minimal assistance;Sit to/from stand       Functional mobility during ADLs: Minimal assistance;Rolling walker (2 wheels) (stand pivot transfer with use of the RW) General ADL Comments: Pt more confused this am.  Not oriented to place, but able to state month.  He was able to follow instructional commands for selfcare tasks but demonstrated limited endurance, not wanting to do much per his report.  Mod encouragement provided with completion of UB bathing and toileting tasks.  Vitals stable throughout      Cognition Arousal: Alert Behavior During Therapy: WFL for tasks assessed/performed Overall Cognitive Status: Impaired/Different from baseline Area of Impairment:  Orientation, Attention, Problem solving, Awareness, Memory                 Orientation Level: Disoriented to, Place, Time, Situation Current Attention Level: Sustained Memory: Decreased short-term memory     Awareness: Intellectual Problem Solving: Slow processing, Requires verbal cues General Comments: Pt not oriented to place and was unable to recall after being told 3-4 mins earlier.  Later in session he referred to therapist "What hotel are we at?"                   Pertinent Vitals/ Pain       Pain Assessment Pain Assessment: Faces Faces Pain Scale: Hurts little more Pain Location: back and posterior right shoulder blade Pain Descriptors / Indicators: Discomfort         Frequency  Min 1X/week        Progress Toward Goals  OT Goals(current goals can now be found in the care plan section)  Progress towards OT goals: Progressing toward goals  Acute Rehab OT Goals Patient Stated Goal: Pt did not state during session. OT Goal Formulation: With patient Potential to Achieve Goals: Good  Plan         AM-PAC OT "6 Clicks" Daily Activity     Outcome Measure   Help from another person eating meals?: None Help from another person taking care of personal grooming?: A Little Help from another person toileting, which includes using toliet, bedpan, or urinal?: A Little Help from another person bathing (including washing, rinsing, drying)?: A Little Help from another person to put on and taking off regular upper body clothing?: A Little Help from another person to put on and taking off regular lower body clothing?: A Little 6 Click Score: 19    End of Session Equipment Utilized During Treatment: Gait belt;Rolling walker (2 wheels)  OT Visit Diagnosis: Unsteadiness on feet (R26.81);Other abnormalities of gait and mobility (R26.89);Repeated falls (R29.6);Pain Pain - Right/Left: Right Pain - part of body: Shoulder   Activity Tolerance Patient limited by  fatigue   Patient Left in bed;with call bell/phone within reach;with bed alarm set   Nurse Communication Mobility status;Patient requests pain meds        Time: 0926-1009 OT Time Calculation (min): 43 min  Charges: OT General Charges $OT Visit: 1 Visit OT Treatments $Self Care/Home Management : 38-52 mins  Perrin Maltese, OTR/L Acute Rehabilitation Services  Office [phone] 01/27/2023

## 2023-01-27 NOTE — TOC Progression Note (Signed)
Transition of Care (TOC) - Progression Note    Patient Details  Name: Andre Casey. MRN: [id number] Date of Birth: 20-Feb-1930  Transition of Care [facility]) CM/SW Contact  Aryn Safran A [place], [place] Phone Number: 01/27/2023, 12:28 PM  Clinical Narrative:     CSW was contacted by pt's daughter Rosalita Chessman, CSW followed up and left VM as there was no answer. Family is still deciding on pt's placement. CSW will follow up at another time to get decision on facility.     TOC will continue to follow.   Expected Discharge Plan: Skilled Nursing Facility Barriers to Discharge: Continued Medical Work up, English as a second language teacher, SNF Pending bed offer  Expected Discharge Plan and Services In-house Referral: Clinical Social Work                                             Social Determinants of Health (SDOH) Interventions SDOH Screenings   Food Insecurity: Patient Declined (01/24/2023)  Transportation Needs: Patient Declined (01/24/2023)  Utilities: Patient Declined (01/24/2023)  Tobacco Use: Medium Risk (01/23/2023)    Readmission Risk Interventions     No data to display

## 2023-01-27 NOTE — Progress Notes (Signed)
PHARMACY CONSULT NOTE FOR:  OUTPATIENT  PARENTERAL ANTIBIOTIC THERAPY (OPAT)  Informational as the plan is to discharge to SNF  Indication: MSSA bacteremia Regimen: Cefazolin 2g IV every 12 hours End date: 02/22/23 (4 weeks from neg BCx 12/1)  IV antibiotic discharge orders are pended. To discharging provider:  please sign these orders via discharge navigator,  Select New Orders & click on the button choice - Manage This Unsigned Work.     Thank you for allowing pharmacy to be a part of this patient's care.  Georgina Pillion, PharmD, BCPS, BCIDP Infectious Diseases Clinical Pharmacist 01/27/2023 9:43 AM   **Pharmacist phone directory can now be found on [email or web address] ([email or web address]).  Listed under [facility].

## 2023-01-27 NOTE — Plan of Care (Signed)

## 2023-01-27 NOTE — Progress Notes (Signed)
Orthopedics Progress Note  Subjective: No complaints of shoulder pain today  Objective:  Vitals:   01/27/23 0555 01/27/23 0752  BP: 137/79 (!) 143/78  Pulse:  82  Resp:  18  Temp:  97.8 F (36.6 C)  SpO2:  97%    General: Awake and alert  Musculoskeletal: Right shoulder incision well healed with no signs of infection. No pain with PROM of the shoulder and no swelling in the shoulder or the arm Neurovascularly intact  Lab Results  Component Value Date   WBC 13.5 (H) 01/26/2023   HGB 10.4 (L) 01/26/2023   HCT 32.7 (L) 01/26/2023   MCV 87.4 01/26/2023   PLT 318 01/26/2023       Component Value Date/Time   NA 135 01/26/2023 0428   NA 139 04/14/2017 1040   K 3.6 01/26/2023 0428   CL 109 01/26/2023 0428   CO2 19 (L) 01/26/2023 0428   GLUCOSE 156 (H) 01/26/2023 0428   BUN 26 (H) 01/26/2023 0428   BUN 20 04/14/2017 1040   CREATININE 1.36 (H) 01/26/2023 0428   CREATININE 1.03 09/05/2021 1310   CREATININE 1.21 (H) 07/01/2016 0942   CALCIUM 8.1 (L) 01/26/2023 0428   GFRNONAA 49 (L) 01/26/2023 0428   GFRNONAA >60 09/05/2021 1310   GFRAA >60 03/22/2019 0253    Lab Results  Component Value Date   INR 1.2 07/30/2021   INR 1.2 04/25/2021   INR 0.9 03/11/2021    Assessment/Plan: Patient admitted with sepsis. No evidence of shoulder infection. Attempt at aspiration unsuccessful yesterday as dry tap. Activity as tolerated. Will sign off, thanks!  Almedia Balls. Ranell Patrick, MD 01/27/2023 12:21 PM

## 2023-01-27 NOTE — Progress Notes (Signed)
Mobility Specialist Progress Note:    01/27/23 1519  Mobility  Activity Transferred to/from [facility]  Level of Assistance Minimal assist, patient does 75% or more  Assistive Device Front wheel walker  Distance Ambulated (ft) 6 ft  Activity Response Tolerated well  Mobility Referral Yes  $Mobility charge 1 Mobility  Mobility Specialist Start Time (ACUTE ONLY) 1513  Mobility Specialist Stop Time (ACUTE ONLY) 1519  Mobility Specialist Time Calculation (min) (ACUTE ONLY) 6 min   Pt received in bed, agreeable to mobility session. Attempted to ambulate in room, however pt felt urge to defecate. Transferred to [facility] via RW, required MinA to stand. Void successful. Transferred back to bed, call bell in reach, all needs met.  Feliciana Rossetti Mobility Specialist Please contact via Special educational needs teacher or  Rehab office at [phone]

## 2023-01-27 NOTE — Progress Notes (Signed)
PROGRESS NOTE    Andre Casey.  AOZ:[id number] DOB: 1929-03-01 DOA: 01/23/2023 PCP: Lupita Raider, MD  Brief Narrative:  89 year old male who presents from home (where he lives alone) with prostate cancer, chronic suprapubic catheter, HTN, history of ESBL UTI who comes in from home due to generalized weakness and increased frequency of falls. He was septic in the ED with fever of 101.3, tachycardic, and urinalysis positive for pyuria. He was started on meropenem and admitted to the hospital.  Assessment & Plan: Principal Problem:   Generalized weakness  Sepsis secondary to complicated/multi-organism UTI, POA Concurrent bacteremia Rule out septic joint (prosthetic, R shoulder) -Suprapubic catheter in place and likely the source of his UTI, notable fever and leukocytosis at intake -ESBL Klebsiella noted on urine culture -Staff aureus blood cultures returned positive x 2 -ID following recommending PICC line placement with cefazolin 2 g IV every 12 hours x 4 weeks(end date 02/21/2023) -TTE discontinued as it will not change medical management -CT chest abdomen pelvis and face without overt or clear infectious source -Right shoulder aspiration per Ortho 12/2 -results pending -Repeat blood cultures to ensure clearance  Renal mass, unspecified  -Initially noted at [facility] in October, appears stable on repeat imaging here -Plan at that time was to follow-up with outpatient urology and oncology  Generalized weakness secondary to above Moderate to severe protein caloric malnutrition Continue PT, fall precautions; Continue to advance diet as tolerated   AKI, prerenal in the setting of dehydration from poor oral intake Creatinine downtrending slowly, likely complicated by above, increase p.o. intake, no indication for IV fluids at this time   Hypokalemia Repleted, encourage p.o. intake   Mild hypovolemic hyponatremia Not clinically relevant, in the setting of  dehydration   Mild non-anion gap metabolic acidosis in the setting of acute renal insufficiency Not clinically relevant, resolved   QTc prolongation EKG QTc 524 Avoid QTc prolonging agents  Elevated troponin, not clinically relevant  DVT prophylaxis: enoxaparin (LOVENOX) injection 30 mg Start: 01/23/23 2130 Code Status:   Code Status: Limited: Do not attempt resuscitation (DNR) -DNR-LIMITED -Do Not Intubate/DNI  Family Communication: Daughter updated over phone  Status is: Inpatient  Dispo: The patient is from: Home              Anticipated d/c is to: To be determined              Anticipated d/c date is: 24 to 48 hours              Patient currently not medically stable for discharge  Consultants:  None  Procedures:  None  Antimicrobials:  Cefazolin  Subjective: No acute issues or events overnight  Objective: Vitals:   01/26/23 2019 01/27/23 0418 01/27/23 0419 01/27/23 0555  BP: (!) 127/94 (!) 152/78  137/79  Pulse: 97 (!) 110    Resp: 18 (!) 0    Temp: 100 F (37.8 C) 98.6 F (37 C)    TempSrc: Oral Axillary    SpO2: 96% 97%    Weight:   59.1 kg   Height:        Intake/Output Summary (Last 24 hours) at 01/27/2023 0719 Last data filed at 01/27/2023 0500 Gross per 24 hour  Intake 100 ml  Output 2350 ml  Net -2250 ml   Filed Weights   01/25/23 0500 01/26/23 0542 01/27/23 0419  Weight: 59.3 kg 58.2 kg 59.1 kg    Examination:  General:  Pleasantly resting in bed, No acute distress. HEENT:  Normocephalic atraumatic.  Sclerae nonicteric, noninjected.  Extraocular movements intact bilaterally. Neck:  Without mass or deformity.  Trachea is midline. Lungs:  Clear to auscultate bilaterally without rhonchi, wheeze, or rales. Heart:  Regular rate and rhythm.  Without murmurs, rubs, or gallops. Abdomen:  Soft, nontender, nondistended.  Without guarding or rebound. Extremities: Without cyanosis, clubbing, edema, or obvious deformity. Skin:  Warm and dry, no  erythema.  Data Reviewed: I have personally reviewed following labs and imaging studies  CBC: Recent Labs  Lab 01/23/23 1734 01/24/23 0553 01/26/23 0428  WBC 13.8* 13.8* 13.5*  HGB 10.3* 9.7* 10.4*  HCT 31.6* 29.3* 32.7*  MCV 88.5 85.4 87.4  PLT 285 250 318   Basic Metabolic Panel: Recent Labs  Lab 01/23/23 1734 01/24/23 0553 01/25/23 0837 01/26/23 0428  NA 132* 132* 135 135  K 2.8* 3.2* 3.2* 3.6  CL 99 102 103 109  CO2 21* 18* 17* 19*  GLUCOSE 124* 107* 103* 156*  BUN 37* 38* 28* 26*  CREATININE 2.54* 2.29* 1.83* 1.36*  CALCIUM 8.0* 7.9* 8.2* 8.1*  MG  --  2.1  --   --   PHOS  --  1.6*  --   --    GFR: Estimated Creatinine Clearance: 29.6 mL/min (A) (by C-G formula based on SCr of 1.36 mg/dL (H)). Liver Function Tests: Recent Labs  Lab 01/23/23 1734  AST 37  ALT 18  ALKPHOS 74  BILITOT 1.0  PROT 5.7*  ALBUMIN 2.2*   Recent Labs  Lab 01/23/23 1734  LIPASE 21   No results for input(s): "AMMONIA" in the last 168 hours. Coagulation Profile: No results for input(s): "INR", "PROTIME" in the last 168 hours. Cardiac Enzymes: No results for input(s): "CKTOTAL", "CKMB", "CKMBINDEX", "TROPONINI" in the last 168 hours. BNP (last 3 results) No results for input(s): "PROBNP" in the last 8760 hours. HbA1C: No results for input(s): "HGBA1C" in the last 72 hours. CBG: No results for input(s): "GLUCAP" in the last 168 hours. Lipid Profile: No results for input(s): "CHOL", "HDL", "LDLCALC", "TRIG", "CHOLHDL", "LDLDIRECT" in the last 72 hours. Thyroid Function Tests: No results for input(s): "TSH", "T4TOTAL", "FREET4", "T3FREE", "THYROIDAB" in the last 72 hours. Anemia Panel: No results for input(s): "VITAMINB12", "FOLATE", "FERRITIN", "TIBC", "IRON", "RETICCTPCT" in the last 72 hours. Sepsis Labs: Recent Labs  Lab 01/23/23 2021  LATICACIDVEN 1.0    Recent Results (from the past 240 hour(s))  Urine Culture     Status: Abnormal   Collection Time: 01/23/23   7:32 PM   Specimen: Urine, Suprapubic  Result Value Ref Range Status   Specimen Description URINE, SUPRAPUBIC  Final   Special Requests NONE  Final   Culture (A)  Final    20,000 COLONIES/mL KLEBSIELLA PNEUMONIAE >=100,000 COLONIES/mL STAPHYLOCOCCUS AUREUS >=100,000 COLONIES/mL AEROCOCCUS SPECIES Standardized susceptibility testing for this organism is not available. Performed at [facility], [address] [place], [place] [address]    Report Status 01/26/2023 FINAL  Final   Organism ID, Bacteria KLEBSIELLA PNEUMONIAE (A)  Final   Organism ID, Bacteria STAPHYLOCOCCUS AUREUS (A)  Final      Susceptibility   Klebsiella pneumoniae - MIC*    AMPICILLIN RESISTANT Resistant     CEFAZOLIN <=4 SENSITIVE Sensitive     CEFEPIME <=0.12 SENSITIVE Sensitive     CEFTRIAXONE <=0.25 SENSITIVE Sensitive     CIPROFLOXACIN <=0.25 SENSITIVE Sensitive     GENTAMICIN <=1 SENSITIVE Sensitive     IMIPENEM <=0.25 SENSITIVE Sensitive     NITROFURANTOIN  64 INTERMEDIATE Intermediate     TRIMETH/SULFA <=20 SENSITIVE Sensitive     AMPICILLIN/SULBACTAM 4 SENSITIVE Sensitive     PIP/TAZO <=4 SENSITIVE Sensitive ug/mL    * 20,000 COLONIES/mL KLEBSIELLA PNEUMONIAE   Staphylococcus aureus - MIC*    CIPROFLOXACIN <=0.5 SENSITIVE Sensitive     GENTAMICIN <=0.5 SENSITIVE Sensitive     NITROFURANTOIN 32 SENSITIVE Sensitive     OXACILLIN 0.5 SENSITIVE Sensitive     TETRACYCLINE <=1 SENSITIVE Sensitive     VANCOMYCIN 1 SENSITIVE Sensitive     TRIMETH/SULFA <=10 SENSITIVE Sensitive     CLINDAMYCIN <=0.25 SENSITIVE Sensitive     RIFAMPIN <=0.5 SENSITIVE Sensitive     Inducible Clindamycin NEGATIVE Sensitive     LINEZOLID 2 SENSITIVE Sensitive     * >=100,000 COLONIES/mL STAPHYLOCOCCUS AUREUS  Culture, blood (routine x 2)     Status: Abnormal   Collection Time: 01/23/23  9:17 PM   Specimen: BLOOD  Result Value Ref Range Status   Specimen Description BLOOD RIGHT ANTECUBITAL  Final   Special Requests    Final    BOTTLES DRAWN AEROBIC AND ANAEROBIC Blood Culture adequate volume   Culture  Setup Time   Final    GRAM POSITIVE COCCI IN BOTH AEROBIC AND ANAEROBIC BOTTLES CRITICAL RESULT CALLED TO, READ BACK BY AND VERIFIED WITH: PHARMD JESSICA MILLEN [phone] [id number] BY Berline Chough, [place] Performed at [facility], [address], [place], [place] [address]    Culture STAPHYLOCOCCUS AUREUS (A)  Final   Report Status 01/26/2023 FINAL  Final   Organism ID, Bacteria STAPHYLOCOCCUS AUREUS  Final      Susceptibility   Staphylococcus aureus - MIC*    CIPROFLOXACIN <=0.5 SENSITIVE Sensitive     ERYTHROMYCIN <=0.25 SENSITIVE Sensitive     GENTAMICIN <=0.5 SENSITIVE Sensitive     OXACILLIN <=0.25 SENSITIVE Sensitive     TETRACYCLINE <=1 SENSITIVE Sensitive     VANCOMYCIN 1 SENSITIVE Sensitive     TRIMETH/SULFA <=10 SENSITIVE Sensitive     CLINDAMYCIN <=0.25 SENSITIVE Sensitive     RIFAMPIN <=0.5 SENSITIVE Sensitive     Inducible Clindamycin NEGATIVE Sensitive     LINEZOLID 2 SENSITIVE Sensitive     * STAPHYLOCOCCUS AUREUS  Culture, blood (routine x 2)     Status: Abnormal   Collection Time: 01/23/23  9:17 PM   Specimen: BLOOD LEFT HAND  Result Value Ref Range Status   Specimen Description BLOOD LEFT HAND  Final   Special Requests   Final    BOTTLES DRAWN AEROBIC AND ANAEROBIC Blood Culture adequate volume   Culture  Setup Time   Final    GRAM POSITIVE COCCI IN BOTH AEROBIC AND ANAEROBIC BOTTLES    Culture (A)  Final    STAPHYLOCOCCUS AUREUS SUSCEPTIBILITIES PERFORMED ON PREVIOUS CULTURE WITHIN THE LAST 5 DAYS. Performed at [facility], [address] [place], [place] [address]    Report Status 01/26/2023 FINAL  Final  Blood Culture ID Panel (Reflexed)     Status: Abnormal   Collection Time: 01/23/23  9:17 PM  Result Value Ref Range Status   Enterococcus faecalis NOT DETECTED NOT DETECTED Final   Enterococcus Faecium NOT DETECTED NOT DETECTED Final   Listeria monocytogenes NOT  DETECTED NOT DETECTED Final   Staphylococcus species DETECTED (A) NOT DETECTED Final    Comment: CRITICAL RESULT CALLED TO, READ BACK BY AND VERIFIED WITH: PHARMD JESSICA MILLEN [id number] BY J RAZZAK, MT    Staphylococcus aureus (BCID) DETECTED (  A) NOT DETECTED Final    Comment: CRITICAL RESULT CALLED TO, READ BACK BY AND VERIFIED WITH: PHARMD JESSICA MILLEN [id number] BY J RAZZAK, MT    Staphylococcus epidermidis NOT DETECTED NOT DETECTED Final   Staphylococcus lugdunensis NOT DETECTED NOT DETECTED Final   Streptococcus species NOT DETECTED NOT DETECTED Final   Streptococcus agalactiae NOT DETECTED NOT DETECTED Final   Streptococcus pneumoniae NOT DETECTED NOT DETECTED Final   Streptococcus pyogenes NOT DETECTED NOT DETECTED Final   A.calcoaceticus-baumannii NOT DETECTED NOT DETECTED Final   Bacteroides fragilis NOT DETECTED NOT DETECTED Final   Enterobacterales NOT DETECTED NOT DETECTED Final   Enterobacter cloacae complex NOT DETECTED NOT DETECTED Final   Escherichia coli NOT DETECTED NOT DETECTED Final   Klebsiella aerogenes NOT DETECTED NOT DETECTED Final   Klebsiella oxytoca NOT DETECTED NOT DETECTED Final   Klebsiella pneumoniae NOT DETECTED NOT DETECTED Final   Proteus species NOT DETECTED NOT DETECTED Final   Salmonella species NOT DETECTED NOT DETECTED Final   Serratia marcescens NOT DETECTED NOT DETECTED Final   Haemophilus influenzae NOT DETECTED NOT DETECTED Final   Neisseria meningitidis NOT DETECTED NOT DETECTED Final   Pseudomonas aeruginosa NOT DETECTED NOT DETECTED Final   Stenotrophomonas maltophilia NOT DETECTED NOT DETECTED Final   Candida albicans NOT DETECTED NOT DETECTED Final   Candida auris NOT DETECTED NOT DETECTED Final   Candida glabrata NOT DETECTED NOT DETECTED Final   Candida krusei NOT DETECTED NOT DETECTED Final   Candida parapsilosis NOT DETECTED NOT DETECTED Final   Candida tropicalis NOT DETECTED NOT DETECTED Final   Cryptococcus  neoformans/gattii NOT DETECTED NOT DETECTED Final   Meth resistant mecA/C and MREJ NOT DETECTED NOT DETECTED Final    Comment: Performed at [facility], [address], [place], [place] [address]  Culture, blood (Routine X 2) w Reflex to ID Panel     Status: None (Preliminary result)   Collection Time: 01/25/23  9:52 AM   Specimen: BLOOD RIGHT ARM  Result Value Ref Range Status   Specimen Description BLOOD RIGHT ARM  Final   Special Requests   Final    BOTTLES DRAWN AEROBIC AND ANAEROBIC Blood Culture results may not be optimal due to an inadequate volume of blood received in culture bottles   Culture   Final    NO GROWTH < 24 HOURS Performed at [facility], [address], [place], [place] [address]    Report Status PENDING  Incomplete  Culture, blood (Routine X 2) w Reflex to ID Panel     Status: None (Preliminary result)   Collection Time: 01/25/23  9:52 AM   Specimen: BLOOD RIGHT HAND  Result Value Ref Range Status   Specimen Description BLOOD RIGHT HAND  Final   Special Requests   Final    BOTTLES DRAWN AEROBIC AND ANAEROBIC Blood Culture results may not be optimal due to an inadequate volume of blood received in culture bottles   Culture   Final    NO GROWTH < 24 HOURS Performed at [facility], [address], [place], [place] [address]    Report Status PENDING  Incomplete         Radiology Studies: ECHOCARDIOGRAM COMPLETE  Result Date: 01/26/2023    ECHOCARDIOGRAM REPORT   Patient Name:   Zahid Ary. Date of Exam: 01/26/2023 Medical Rec #:  [id number]                Height:  67.0 in Accession #:    [id number]               Weight:       128.3 lb Date of Birth:  10-Jun-1929                BSA:          1.675 m Patient Age:    89 years                 BP:           110/71 mmHg Patient Gender: M                        HR:           74 bpm. Exam Location:  Inpatient Procedure: 2D Echo, Cardiac Doppler and Color Doppler Indications:     Endocarditis  History:        Patient has prior history of Echocardiogram examinations, most                 recent 05/03/2020. CAD, Arrythmias:LBBB; Risk                 Factors:Hypertension and Dyslipidemia.  Sonographer:    Vern Claude Referring Phys: [id number] [facility]  Sonographer Comments: Technically difficult study due to poor echo windows. Image acquisition challenging due to respiratory motion. IMPRESSIONS  1. Left ventricular ejection fraction, by estimation, is 60 to 65%. The left ventricle has normal function. Left ventricular endocardial border not optimally defined to evaluate regional wall motion. Left ventricular diastolic parameters are consistent with Grade I diastolic dysfunction (impaired relaxation).  2. Right ventricular systolic function is low normal. The right ventricular size is normal.  3. The mitral valve is grossly normal. No evidence of mitral valve regurgitation.  4. The aortic valve was not well visualized. Aortic valve regurgitation is not visualized.  5. The inferior vena cava is normal in size with greater than 50% respiratory variability, suggesting right atrial pressure of 3 mmHg. Comparison(s): Changes from prior study are noted. 05/03/2020: LVEF 55-60%. Conclusion(s)/Recommendation(s): No evidence of valvular vegetations on this transthoracic echocardiogram. Consider a transesophageal echocardiogram to exclude infective endocarditis if clinically indicated. Limited evaluation of the valves due to poor echo windows, but no obvious vegetation. FINDINGS  Left Ventricle: Left ventricular ejection fraction, by estimation, is 60 to 65%. The left ventricle has normal function. Left ventricular endocardial border not optimally defined to evaluate regional wall motion. The left ventricular internal cavity size was normal in size. There is no left ventricular hypertrophy. Left ventricular diastolic parameters are consistent with Grade I diastolic dysfunction (impaired relaxation).  Indeterminate filling pressures. Right Ventricle: The right ventricular size is normal. No increase in right ventricular wall thickness. Right ventricular systolic function is low normal. Left Atrium: Left atrial size was normal in size. Right Atrium: Right atrial size was normal in size. Pericardium: There is no evidence of pericardial effusion. Mitral Valve: The mitral valve is grossly normal. No evidence of mitral valve regurgitation. MV peak gradient, 3.2 mmHg. The mean mitral valve gradient is 1.0 mmHg. Tricuspid Valve: The tricuspid valve is grossly normal. Tricuspid valve regurgitation is not demonstrated. Aortic Valve: The aortic valve was not well visualized. Aortic valve regurgitation is not visualized. Aortic valve mean gradient measures 3.0 mmHg. Aortic valve peak gradient measures 5.2 mmHg. Aortic valve area, by VTI measures 1.67 cm. Pulmonic Valve: The pulmonic valve was not well visualized. Pulmonic  valve regurgitation is not visualized. Aorta: The aortic root and ascending aorta are structurally normal, with no evidence of dilitation. Venous: The inferior vena cava is normal in size with greater than 50% respiratory variability, suggesting right atrial pressure of 3 mmHg. IAS/Shunts: The interatrial septum was not well visualized.  LEFT VENTRICLE PLAX 2D LVIDd:         3.90 cm     Diastology LVIDs:         2.90 cm     LV e' medial:    7.15 cm/s LV PW:         0.70 cm     LV E/e' medial:  10.0 LV IVS:        0.90 cm     LV e' lateral:   7.30 cm/s LVOT diam:     1.90 cm     LV E/e' lateral: 9.8 LV SV:         34 LV SV Index:   20 LVOT Area:     2.84 cm  LV Volumes (MOD) LV vol d, MOD A2C: 63.3 ml LV vol d, MOD A4C: 63.3 ml LV vol s, MOD A2C: 17.9 ml LV vol s, MOD A4C: 27.7 ml LV SV MOD A2C:     45.4 ml LV SV MOD A4C:     63.3 ml LV SV MOD BP:      47.9 ml RIGHT VENTRICLE             IVC RV S prime:     10.90 cm/s  IVC diam: 1.80 cm TAPSE (M-mode): 2.0 cm LEFT ATRIUM           Index        RIGHT  ATRIUM           Index LA diam:      3.10 cm 1.85 cm/m   RA Area:     11.90 cm LA Vol (A2C): 41.9 ml 25.02 ml/m  RA Volume:   24.80 ml  14.81 ml/m LA Vol (A4C): 12.5 ml 7.46 ml/m  AORTIC VALVE                    PULMONIC VALVE AV Area (Vmax):    1.78 cm     PV Vmax:       0.62 m/s AV Area (Vmean):   2.05 cm     PV Peak grad:  1.5 mmHg AV Area (VTI):     1.67 cm AV Vmax:           114.00 cm/s AV Vmean:          73.500 cm/s AV VTI:            0.202 m AV Peak Grad:      5.2 mmHg AV Mean Grad:      3.0 mmHg LVOT Vmax:         71.40 cm/s LVOT Vmean:        53.100 cm/s LVOT VTI:          0.119 m LVOT/AV VTI ratio: 0.59  AORTA Ao Root diam: 3.20 cm Ao Asc diam:  2.90 cm MITRAL VALVE MV Area (PHT): 3.10 cm    SHUNTS MV Area VTI:   1.98 cm    Systemic VTI:  0.12 m MV Peak grad:  3.2 mmHg    Systemic Diam: 1.90 cm MV Mean grad:  1.0 mmHg MV Vmax:       0.90 m/s MV Vmean:  53.5 cm/s MV Decel Time: 245 msec MV E velocity: 71.60 cm/s MV A velocity: 76.40 cm/s MV E/A ratio:  0.94 Zoila Shutter MD Electronically signed by Zoila Shutter MD Signature Date/Time: 01/26/2023/10:35:05 AM    Final    CT CHEST ABDOMEN PELVIS WO CONTRAST  Result Date: 01/26/2023 CLINICAL DATA:  Systemic infection. EXAM: CT CHEST, ABDOMEN AND PELVIS WITHOUT CONTRAST TECHNIQUE: Multidetector CT imaging of the chest, abdomen and pelvis was performed following the standard protocol without IV contrast. RADIATION DOSE REDUCTION: This exam was performed according to the departmental dose-optimization program which includes automated exposure control, adjustment of the mA and/or kV according to patient size and/or use of iterative reconstruction technique. COMPARISON:  CTA chest 01/14/2019, CT abdomen and pelvis without contrast 07/30/2021, CT abdomen pelvis with contrast 07/03/2021. FINDINGS: CT CHEST FINDINGS Cardiovascular: Normal caliber the pulmonary arteries and veins. Normal cardiac size with small chronic anterior pericardial effusion.  Single-vessel calcifications and prior stenting LAD coronary artery. Aortic atherosclerosis and tortuosity without aneurysm, with normal great vessel branching. Mediastinum/Nodes: No enlarged mediastinal, hilar, or axillary lymph nodes. Thyroid gland, trachea, and esophagus demonstrate no significant findings. Both main bronchi are clear. Lungs/Pleura: Paraseptal and centrilobular emphysematous changes. Minimal bilateral layering pleural effusions slightly greater fluid on the left. Mild bronchial thickening noted left upper and both lower lobes. Calcified granuloma left apex. Mild perifissural atelectasis is seen in both upper lobes but no active infiltrates. Mild chronic elevation right hemidiaphragm. The lungs are otherwise clear. Musculoskeletal: Spray artifact from old right shoulder arthroplasty. Chronic resection distal left clavicle and left acromiohumeral abutment consistent with chronic rotator cuff arthropathy. Osteopenia and degenerative change thoracic spine. No acute or other significant osseous findings or chest wall mass. CT ABDOMEN PELVIS FINDINGS Hepatobiliary: The liver is unremarkable without contrast. There is mild gallbladder dilatation without wall thickening, calcified stones or biliary dilatation. Pancreas: Hypodense pancreatic head lesion (Hounsfield density of 28) again noted anteriorly, measuring 2.9 x 2.5 cm, previously 2.4 x 2 cm on the last CT and has been described and reported previously. Again this abuts the anterior and right lateral wall of the SMV. Per report of an MRI from 02/16/2019 this has been slowly growing since 2007, and was thought to be a cystic lesion by MRI appearance compatible with a pseudocyst or side branch IPMN. There is no upstream pancreatic ductal dilatation, no inflammatory changes and no other masslike contour deformity. Spleen: No abnormality. Adrenals/Urinary Tract: There is no adrenal mass. Right renal mass slightly hyperdense to cortex again is noted in  the interpolar area measuring 2.9 x 1.9 cm on 3:73, on the last CT similar in size, and on previous imaging noted consistent with a renal cell carcinoma. There is no new contour deforming abnormality of either kidney. On the left, there is mild-to-moderate new left hydroureteronephrosis. Perinephric edema. Correlate clinically for infectious complication. There are no intrarenal stones currently or previously. Etiology of the findings of obstructive uropathy is not identified as the most distal left ureter and the UVJ are obscured by numerous pelvic surgical clips. Any number of obstructive etiologies could be present, from interval new stone disease to a distal ureteral stricture or mass. The bladder is contracted around a suprapubic catheter balloon is not well evaluated, also mostly obscured by spray artifact from the surgical clips. Suprapubic catheter was also in place on both prior studies from last year. Stomach/Bowel: No dilatation, wall thickening or inflammatory change. This includes the appendix. Small hiatal hernia. Colonic diverticulosis noted heaviest in the sigmoid  segment without acute diverticulitis. Vascular/Lymphatic: Aortic atherosclerosis. No enlarged abdominal or pelvic lymph nodes. There previously was a mildly prominent left inguinal chain nodes which is not seen today. There previously was a slightly prominent retroaortic lymph node which is also no longer seen. Reproductive: Radical prostatectomy. Allowing for spray artifact from surgical clips no recurrent mass is seen in the prostate bed. Surgical clips continue up the pelvic sidewalls consistent with lymph node dissection. Other: Small volume of posterior deep pelvic ascites. Not seen previously. There is no free hemorrhage, free air or incarcerated hernia, no focal inflammatory process. Musculoskeletal: Osteopenia and advanced degenerative change lumbar spine with mild levoscoliosis. L4 previously demonstrated a densely sclerotic  vertebral body lesion, where currently there is trabecular coarsening and scattered cystic spaces in the vertebral body. 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 No other focal bone lesion is seen. IMPRESSION: 1. New mild-to-moderate left hydroureteronephrosis with perinephric edema. Correlate clinically for infectious complication. Etiology of the obstructive uropathy is not identified as the most distal left ureter and the UVJ are obscured by numerous pelvic surgical clips. Any number of obstructive etiologies could be present, from interval new stone disease to a distal ureteral stricture or mass. 2. No intrarenal stones currently or previously. 3. 2.9 x 1.9 cm right renal mass consistent with a renal cell carcinoma, similar in size to the last CT. 4. Radical prostatectomy with no evidence of recurrent mass in the prostate bed. 5. Small volume of posterior deep pelvic ascites, new. 6. Minimal bilateral pleural effusions. 7. Emphysema. 8. 2.9 x 2.5 cm hypodense pancreatic head lesion, previously 2.4 x 2 cm on the last CT and has been described and reported previously. Per report of an MRI from 02/16/2019 this has been slowly growing since 2007, and was thought to be a cystic lesion by MRI appearance compatible with a pseudocyst or side branch IPMN. 9. Aortic and coronary artery atherosclerosis. 10. L4 previously demonstrated a densely sclerotic vertebral body lesion, where currently there is trabecular coarsening and scattered cystic spaces in the vertebral body. 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 11. Mild gallbladder dilatation without wall thickening, calcified stones or biliary dilatation. 12. Small hiatal hernia. 13. Colonic diverticulosis without evidence of acute diverticulitis. Aortic Atherosclerosis (ICD10-I70.0) and Emphysema (ICD10-J43.9). Electronically Signed   By: Almira Bar M.D.   On: 01/26/2023 00:18   CT MAXILLOFACIAL WO CONTRAST  Result Date: 01/25/2023 CLINICAL DATA:  Complicated  UTI, recent falls. EXAM: CT MAXILLOFACIAL WITHOUT CONTRAST TECHNIQUE: Multidetector CT imaging of the maxillofacial structures was performed. Multiplanar CT image reconstructions were also generated. RADIATION DOSE REDUCTION: This exam was performed according to the departmental dose-optimization program which includes automated exposure control, adjustment of the mA and/or kV according to patient size and/or use of iterative reconstruction technique. COMPARISON:  CT neck 04/15/2021, MRI head 07/21/2015, CT head 01/22/2022 FINDINGS: Osseous: No fracture or mandibular dislocation. No destructive process. Bilateral temporomandibular joint degenerative changes. Orbits: Similar-appearing bilateral enophthalmos. No traumatic or inflammatory finding. Sinuses: Clear. Soft tissues: Negative. Limited intracranial: No significant or unexpected finding. Other: Degenerative changes of the spine with grade 1 anterolisthesis of C3 on C4 and C4 on C5. Mild retrolisthesis of C5 on C6 and C6 on C7. IMPRESSION: No acute displaced facial fracture. Electronically Signed   By: Tish Frederickson M.D.   On: 01/25/2023 23:36   DG Shoulder Right  Result Date: 01/25/2023 CLINICAL DATA:  Generalized weakness and right shoulder pain for a few days. EXAM: RIGHT SHOULDER - 3 VIEW  COMPARISON:  X-ray 04/20/2015. FINDINGS: Osteopenia. Reverse shoulder arthroplasty again identified. Stable alignment. No hardware failure. Press-Fit humeral component and screw fixated glenoid component. Hypertrophic degenerative changes of the [facility] joint. Global osteopenia. No fracture or dislocation. No evidence of hardware failure. IMPRESSION: Reverse shoulder arthroplasty.  Degenerative changes and osteopenia. Electronically Signed   By: Karen Kays M.D.   On: 01/25/2023 18:07        Scheduled Meds:  bupivacaine(PF)  10 mL Infiltration Once   diclofenac Sodium  4 g Topical QID   enoxaparin (LOVENOX) injection  30 mg Subcutaneous Q24H   feeding supplement   237 mL Oral BID BM   Continuous Infusions:   ceFAZolin (ANCEF) IV 2 g (01/26/23 2307)     LOS: 4 days   Time spent:  Azucena Fallen, DO [facility]  If 7PM-7AM, please contact night-coverage [email or web address]  01/27/2023, 7:19 AM

## 2023-01-27 NOTE — Progress Notes (Signed)
[facility]   Reason for visit: Follow up on bacteremia  Interval History: repeat blood cultures from 12/1 with ngtd; WBC 13.5, Tmax 100.0 over last 24 hours Day 5 total antibiotics Day 3 cefazolin  Physical Exam: Constitutional:  Vitals:   01/27/23 0555 01/27/23 0752  BP: 137/79 (!) 143/78  Pulse:  82  Resp:  18  Temp:  97.8 F (36.6 C)  SpO2:  97%   patient appears in NAD Respiratory: Normal respiratory effort; CTA B Neuro: pleasant, demented  Review of Systems: Unable to be assessed due to patient factors  Lab Results  Component Value Date   WBC 13.5 (H) 01/26/2023   HGB 10.4 (L) 01/26/2023   HCT 32.7 (L) 01/26/2023   MCV 87.4 01/26/2023   PLT 318 01/26/2023    Lab Results  Component Value Date   CREATININE 1.36 (H) 01/26/2023   BUN 26 (H) 01/26/2023   NA 135 01/26/2023   K 3.6 01/26/2023   CL 109 01/26/2023   CO2 19 (L) 01/26/2023    Lab Results  Component Value Date   ALT 18 01/23/2023   AST 37 01/23/2023   ALKPHOS 74 01/23/2023     Microbiology: Recent Results (from the past 240 hour(s))  Urine Culture     Status: Abnormal   Collection Time: 01/23/23  7:32 PM   Specimen: Urine, Suprapubic  Result Value Ref Range Status   Specimen Description URINE, SUPRAPUBIC  Final   Special Requests NONE  Final   Culture (A)  Final    20,000 COLONIES/mL KLEBSIELLA PNEUMONIAE >=100,000 COLONIES/mL STAPHYLOCOCCUS AUREUS >=100,000 COLONIES/mL AEROCOCCUS SPECIES Standardized susceptibility testing for this organism is not available. Performed at [facility], [address] [place], [place] [address]    Report Status 01/26/2023 FINAL  Final   Organism ID, Bacteria KLEBSIELLA PNEUMONIAE (A)  Final   Organism ID, Bacteria STAPHYLOCOCCUS AUREUS (A)  Final      Susceptibility   Klebsiella pneumoniae - MIC*    AMPICILLIN RESISTANT Resistant     CEFAZOLIN <=4 SENSITIVE Sensitive     CEFEPIME <=0.12 SENSITIVE Sensitive      CEFTRIAXONE <=0.25 SENSITIVE Sensitive     CIPROFLOXACIN <=0.25 SENSITIVE Sensitive     GENTAMICIN <=1 SENSITIVE Sensitive     IMIPENEM <=0.25 SENSITIVE Sensitive     NITROFURANTOIN 64 INTERMEDIATE Intermediate     TRIMETH/SULFA <=20 SENSITIVE Sensitive     AMPICILLIN/SULBACTAM 4 SENSITIVE Sensitive     PIP/TAZO <=4 SENSITIVE Sensitive ug/mL    * 20,000 COLONIES/mL KLEBSIELLA PNEUMONIAE   Staphylococcus aureus - MIC*    CIPROFLOXACIN <=0.5 SENSITIVE Sensitive     GENTAMICIN <=0.5 SENSITIVE Sensitive     NITROFURANTOIN 32 SENSITIVE Sensitive     OXACILLIN 0.5 SENSITIVE Sensitive     TETRACYCLINE <=1 SENSITIVE Sensitive     VANCOMYCIN 1 SENSITIVE Sensitive     TRIMETH/SULFA <=10 SENSITIVE Sensitive     CLINDAMYCIN <=0.25 SENSITIVE Sensitive     RIFAMPIN <=0.5 SENSITIVE Sensitive     Inducible Clindamycin NEGATIVE Sensitive     LINEZOLID 2 SENSITIVE Sensitive     * >=100,000 COLONIES/mL STAPHYLOCOCCUS AUREUS  Culture, blood (routine x 2)     Status: Abnormal   Collection Time: 01/23/23  9:17 PM   Specimen: BLOOD  Result Value Ref Range Status   Specimen Description BLOOD RIGHT ANTECUBITAL  Final   Special Requests   Final    BOTTLES DRAWN AEROBIC AND ANAEROBIC Blood Culture adequate volume  Culture  Setup Time   Final    GRAM POSITIVE COCCI IN BOTH AEROBIC AND ANAEROBIC BOTTLES CRITICAL RESULT CALLED TO, READ BACK BY AND VERIFIED WITH: PHARMD JESSICA MILLEN [phone] [id number] BY Berline Chough, [place] Performed at [facility], [address] [place], [place] [address]    Culture STAPHYLOCOCCUS AUREUS (A)  Final   Report Status 01/26/2023 FINAL  Final   Organism ID, Bacteria STAPHYLOCOCCUS AUREUS  Final      Susceptibility   Staphylococcus aureus - MIC*    CIPROFLOXACIN <=0.5 SENSITIVE Sensitive     ERYTHROMYCIN <=0.25 SENSITIVE Sensitive     GENTAMICIN <=0.5 SENSITIVE Sensitive     OXACILLIN <=0.25 SENSITIVE Sensitive     TETRACYCLINE <=1 SENSITIVE Sensitive     VANCOMYCIN 1  SENSITIVE Sensitive     TRIMETH/SULFA <=10 SENSITIVE Sensitive     CLINDAMYCIN <=0.25 SENSITIVE Sensitive     RIFAMPIN <=0.5 SENSITIVE Sensitive     Inducible Clindamycin NEGATIVE Sensitive     LINEZOLID 2 SENSITIVE Sensitive     * STAPHYLOCOCCUS AUREUS  Culture, blood (routine x 2)     Status: Abnormal   Collection Time: 01/23/23  9:17 PM   Specimen: BLOOD LEFT HAND  Result Value Ref Range Status   Specimen Description BLOOD LEFT HAND  Final   Special Requests   Final    BOTTLES DRAWN AEROBIC AND ANAEROBIC Blood Culture adequate volume   Culture  Setup Time   Final    GRAM POSITIVE COCCI IN BOTH AEROBIC AND ANAEROBIC BOTTLES    Culture (A)  Final    STAPHYLOCOCCUS AUREUS SUSCEPTIBILITIES PERFORMED ON PREVIOUS CULTURE WITHIN THE LAST 5 DAYS. Performed at [facility], [address], [place], [place] [address]    Report Status 01/26/2023 FINAL  Final  Blood Culture ID Panel (Reflexed)     Status: Abnormal   Collection Time: 01/23/23  9:17 PM  Result Value Ref Range Status   Enterococcus faecalis NOT DETECTED NOT DETECTED Final   Enterococcus Faecium NOT DETECTED NOT DETECTED Final   Listeria monocytogenes NOT DETECTED NOT DETECTED Final   Staphylococcus species DETECTED (A) NOT DETECTED Final    Comment: CRITICAL RESULT CALLED TO, READ BACK BY AND VERIFIED WITH: PHARMD JESSICA MILLEN [phone] [id number] BY J RAZZAK, [place]    Staphylococcus aureus (BCID) DETECTED (A) NOT DETECTED Final    Comment: CRITICAL RESULT CALLED TO, READ BACK BY AND VERIFIED WITH: PHARMD JESSICA MILLEN [phone] [id number] BY J RAZZAK, MT    Staphylococcus epidermidis NOT DETECTED NOT DETECTED Final   Staphylococcus lugdunensis NOT DETECTED NOT DETECTED Final   Streptococcus species NOT DETECTED NOT DETECTED Final   Streptococcus agalactiae NOT DETECTED NOT DETECTED Final   Streptococcus pneumoniae NOT DETECTED NOT DETECTED Final   Streptococcus pyogenes NOT DETECTED NOT DETECTED Final    A.calcoaceticus-baumannii NOT DETECTED NOT DETECTED Final   Bacteroides fragilis NOT DETECTED NOT DETECTED Final   Enterobacterales NOT DETECTED NOT DETECTED Final   Enterobacter cloacae complex NOT DETECTED NOT DETECTED Final   Escherichia coli NOT DETECTED NOT DETECTED Final   Klebsiella aerogenes NOT DETECTED NOT DETECTED Final   Klebsiella oxytoca NOT DETECTED NOT DETECTED Final   Klebsiella pneumoniae NOT DETECTED NOT DETECTED Final   Proteus species NOT DETECTED NOT DETECTED Final   Salmonella species NOT DETECTED NOT DETECTED Final   Serratia marcescens NOT DETECTED NOT DETECTED Final   Haemophilus influenzae NOT DETECTED NOT DETECTED Final   Neisseria meningitidis NOT DETECTED NOT DETECTED Final  Pseudomonas aeruginosa NOT DETECTED NOT DETECTED Final   Stenotrophomonas maltophilia NOT DETECTED NOT DETECTED Final   Candida albicans NOT DETECTED NOT DETECTED Final   Candida auris NOT DETECTED NOT DETECTED Final   Candida glabrata NOT DETECTED NOT DETECTED Final   Candida krusei NOT DETECTED NOT DETECTED Final   Candida parapsilosis NOT DETECTED NOT DETECTED Final   Candida tropicalis NOT DETECTED NOT DETECTED Final   Cryptococcus neoformans/gattii NOT DETECTED NOT DETECTED Final   Meth resistant mecA/C and MREJ NOT DETECTED NOT DETECTED Final    Comment: Performed at [facility], [address] [place], [place] [address]  Culture, blood (Routine X 2) w Reflex to ID Panel     Status: None (Preliminary result)   Collection Time: 01/25/23  9:52 AM   Specimen: BLOOD RIGHT ARM  Result Value Ref Range Status   Specimen Description BLOOD RIGHT ARM  Final   Special Requests   Final    BOTTLES DRAWN AEROBIC AND ANAEROBIC Blood Culture results may not be optimal due to an inadequate volume of blood received in culture bottles   Culture   Final    NO GROWTH 2 DAYS Performed at [facility], [address] [place], [place] [address]    Report Status PENDING  Incomplete   Culture, blood (Routine X 2) w Reflex to ID Panel     Status: None (Preliminary result)   Collection Time: 01/25/23  9:52 AM   Specimen: BLOOD RIGHT HAND  Result Value Ref Range Status   Specimen Description BLOOD RIGHT HAND  Final   Special Requests   Final    BOTTLES DRAWN AEROBIC AND ANAEROBIC Blood Culture results may not be optimal due to an inadequate volume of blood received in culture bottles   Culture   Final    NO GROWTH 2 DAYS Performed at [facility], [address], [place], [place] [address]    Report Status PENDING  Incomplete    Impression/Plan:  1. MSSA bacteremia - TTE without any particular concerns.  Repeat cultures remain negative to date.   Due to his comorbidities, age, I do not recommend a TEE and recommend instead prolonged IV antibiotics and plan for 4 weeks.   Discussed with Dr. Natale Milch  2.  Access - if his blood cultures remain negative tomorrow, ok to place a picc line  Diagnosis: MSSA bacteremia  Culture Result: MSSA  Allergies  Allergen Reactions   Aricept [Donepezil] Nausea Only    Report upset stomach - unable to tolerate.   Namzaric [Memantine Hcl-Donepezil Hcl] Other (See Comments)    Stomach upset  Pt able to take Memantine by itself.    OPAT Orders Discharge antibiotics to be given via PICC line Discharge antibiotics:cefazolin 2 grams IV every 12 hours Per pharmacy protocol yes Duration: 4 weeks End Date: 02/21/23  [facility] Care Per Protocol: yes  Home health RN for IV administration and teaching; PICC line care and labs.    Labs weekly while on IV antibiotics: _x_ CBC with differential __ BMP _x_ CMP __ CRP __ ESR __ Vancomycin trough __ CK  _x_ Please pull PIC at completion of IV antibiotics __ Please leave PIC in place until doctor has seen patient or been notified  Fax weekly labs to [phone]  Clinic Follow Up Appt: 12/23 with Dr. Luciana Axe  @ 3:45

## 2023-01-27 NOTE — Care Management Important Message (Signed)
Important Message  Patient Details  Name: Andre Casey. MRN: [id number] Date of Birth: 1929-04-29   Important Message Given:  Yes - Medicare IM     Jerene Yeager 01/27/2023, 4:01 PM

## 2023-01-27 NOTE — Plan of Care (Signed)
  Problem: Education: Goal: Knowledge of General Education information will improve Description: Including pain rating scale, medication(s)/side effects and non-pharmacologic comfort measures Outcome: Progressing   Problem: Health Behavior/Discharge Planning: Goal: Ability to manage health-related needs will improve Outcome: Progressing   Problem: Clinical Measurements: Goal: Ability to maintain clinical measurements within normal limits will improve Outcome: Progressing Goal: Will remain free from infection Outcome: Progressing Goal: Respiratory complications will improve Outcome: Progressing Goal: Cardiovascular complication will be avoided Outcome: Progressing   Problem: Clinical Measurements: Goal: Will remain free from infection Outcome: Progressing   Problem: Clinical Measurements: Goal: Respiratory complications will improve Outcome: Progressing   Problem: Clinical Measurements: Goal: Cardiovascular complication will be avoided Outcome: Progressing

## 2023-01-28 ENCOUNTER — Other Ambulatory Visit: Payer: Self-pay

## 2023-01-28 DIAGNOSIS — R531 Weakness: Secondary | ICD-10-CM | POA: Diagnosis not present

## 2023-01-28 LAB — BASIC METABOLIC PANEL
Anion gap: 9 (ref 5–15)
BUN: 29 mg/dL — ABNORMAL HIGH (ref 8–23)
CO2: 23 mmol/L (ref 22–32)
Calcium: 8.3 mg/dL — ABNORMAL LOW (ref 8.9–10.3)
Chloride: 105 mmol/L (ref 98–111)
Creatinine, Ser: 1.33 mg/dL — ABNORMAL HIGH (ref 0.61–1.24)
GFR, Estimated: 50 mL/min — ABNORMAL LOW (ref >60)
Glucose, Bld: 117 mg/dL — ABNORMAL HIGH (ref 70–99)
Potassium: 3.5 mmol/L (ref 3.5–5.1)
Sodium: 137 mmol/L (ref 135–145)

## 2023-01-28 LAB — CBC
HCT: 35.9 % — ABNORMAL LOW (ref 39.0–52.0)
Hemoglobin: 11.7 g/dL — ABNORMAL LOW (ref 13.0–17.0)
MCH: 28.7 pg (ref 26.0–34.0)
MCHC: 32.6 g/dL (ref 30.0–36.0)
MCV: 88 fL (ref 80.0–100.0)
Platelets: 617 10*3/uL — ABNORMAL HIGH (ref 150–400)
RBC: 4.08 MIL/uL — ABNORMAL LOW (ref 4.22–5.81)
RDW: 15 % (ref 11.5–15.5)
WBC: 11.6 10*3/uL — ABNORMAL HIGH (ref 4.0–10.5)
nRBC: 0 % (ref 0.0–0.2)

## 2023-01-28 MED ORDER — ENSURE ENLIVE PO LIQD: 237.0000 mL | Freq: Two times a day (BID) | ORAL | Status: AC

## 2023-01-28 MED ORDER — SODIUM CHLORIDE 0.9% FLUSH: 10.0000 mL | INTRAVENOUS | Status: DC | PRN

## 2023-01-28 MED ORDER — TRAMADOL HCL 50 MG PO TABS: 50.0000 mg | ORAL_TABLET | Freq: Two times a day (BID) | ORAL | 0 refills | Status: DC | PRN

## 2023-01-28 MED ORDER — CHLORHEXIDINE GLUCONATE CLOTH 2 % EX PADS
6.0000 | MEDICATED_PAD | Freq: Every day | CUTANEOUS | Status: DC
Start: 2023-01-28 — End: 2023-01-30
  Administered 2023-01-28 – 2023-01-29 (×2): 6 via TOPICAL

## 2023-01-28 MED ORDER — SODIUM CHLORIDE 0.9% FLUSH
10.0000 mL | Freq: Two times a day (BID) | INTRAVENOUS | Status: DC
  Administered 2023-01-28 – 2023-01-30 (×5): 10 mL

## 2023-01-28 MED ORDER — CEFAZOLIN IV (FOR PTA / DISCHARGE USE ONLY): 2.0000 g | Freq: Two times a day (BID) | INTRAVENOUS | 0 refills | Status: AC

## 2023-01-28 MED ORDER — MELATONIN 5 MG PO TABS: 5.0000 mg | ORAL_TABLET | Freq: Every evening | ORAL | Status: DC | PRN

## 2023-01-28 NOTE — Plan of Care (Signed)

## 2023-01-28 NOTE — Progress Notes (Signed)
Physical Therapy Treatment Patient Details Name: Andre Casey. MRN: [id number] DOB: April 25, 1929 Today's Date: 01/28/2023   History of Present Illness 89 year old male who comes in from home due to generalized weakness and increased frequency of falls.  Urinalysis was positive for pyuria with increased fevers.  History of prostate cancer, chronic suprapubic catheter, HTN, history of ESBL UTI, CAD.    PT Comments  Pt received in supine and agreeable to PT session. The pt was able to transfer from bed to [facility] via step pivot with the use of a RW and CGA. The pt was able to tolerate ambulating short distances inside of the room with the use of RW and minA. The pt required minA to weight shift due to LOB during ambulation.The pt required VC for proper use of the RW and hand placement. PT utilized a brief during ambulation due to bowel incontinence. The pt was left sitting in the recliner while eating breakfast. The pt will continue to benefit from skilled PT to address remaining deficits.   If plan is discharge home, recommend the following: A little help with walking and/or transfers;A little help with bathing/dressing/bathroom;Assistance with cooking/housework;Direct supervision/assist for medications management;Assist for transportation;Supervision due to cognitive status;Help with stairs or ramp for entrance   Can travel by private vehicle     No  Equipment Recommendations  None recommended by PT    Recommendations for Other Services       Precautions / Restrictions Precautions Precautions: Fall Restrictions Weight Bearing Restrictions: No     Mobility  Bed Mobility Overal bed mobility: Needs Assistance Bed Mobility: Supine to Sit     Supine to sit: HOB elevated, Supervision          Transfers Overall transfer level: Needs assistance Equipment used: Rolling walker (2 wheels) Transfers: Sit to/from Stand Sit to Stand: Contact guard assist   Step pivot transfers:  Contact guard assist       General transfer comment: Pt required VC for hand placement with sit to stand.    Ambulation/Gait Ambulation/Gait assistance: Min assist Gait Distance (Feet): 20 Feet (x3 trials) Assistive device: Rolling walker (2 wheels) Gait Pattern/deviations: Step-through pattern, Decreased stride length, Trunk flexed Gait velocity: slow Gait velocity interpretation: <1.31 ft/sec, indicative of household ambulator   General Gait Details: Pt required minA for weight shift following LOB due to pt lifting the walker to turn. Pt required VC for proper use of the walker.   Stairs             Wheelchair Mobility     Tilt Bed    Modified Rankin (Stroke Patients Only)       Balance Overall balance assessment: Needs assistance Sitting-balance support: Feet supported, No upper extremity supported Sitting balance-Leahy Scale: Fair     Standing balance support: Bilateral upper extremity supported, During functional activity Standing balance-Leahy Scale: Poor                              Cognition Arousal: Alert Behavior During Therapy: WFL for tasks assessed/performed Overall Cognitive Status: Within Functional Limits for tasks assessed                                          Exercises      General Comments General comments (skin integrity, edema, etc.): Pt has bowel incontinence and requires  to have a brief on during ambulation.      Pertinent Vitals/Pain Pain Assessment Pain Assessment: No/denies pain    Home Living                          Prior Function            PT Goals (current goals can now be found in the care plan section) Acute Rehab PT Goals Patient Stated Goal: to get better PT Goal Formulation: With patient Time For Goal Achievement: 02/07/23 Potential to Achieve Goals: Good Progress towards PT goals: Progressing toward goals    Frequency    Min 1X/week      PT Plan       Co-evaluation              AM-PAC PT "6 Clicks" Mobility   Outcome Measure  Help needed turning from your back to your side while in a flat bed without using bedrails?: A Little Help needed moving from lying on your back to sitting on the side of a flat bed without using bedrails?: A Little Help needed moving to and from a bed to a chair (including a wheelchair)?: A Little Help needed standing up from a chair using your arms (e.g., wheelchair or bedside chair)?: A Little Help needed to walk in hospital room?: A Lot Help needed climbing 3-5 steps with a railing? : A Lot 6 Click Score: 16    End of Session Equipment Utilized During Treatment: Gait belt Activity Tolerance: Patient tolerated treatment well Patient left: in chair;with call bell/phone within reach;with chair alarm set Nurse Communication: Mobility status PT Visit Diagnosis: Other abnormalities of gait and mobility (R26.89)     Time: 4627-0350 PT Time Calculation (min) (ACUTE ONLY): 30 min  Charges:    $Gait Training: 8-22 mins $Therapeutic Activity: 8-22 mins PT General Charges $$ ACUTE PT VISIT: 1 Visit                     Caryl Comes, SPT Acute Rehabilitation Office Phone [phone]    Caryl Comes 01/28/2023, 11:58 AM

## 2023-01-28 NOTE — Progress Notes (Signed)
Peripherally Inserted Central Catheter Placement  The IV Nurse has discussed with the patient and/or persons authorized to consent for the patient, the purpose of this procedure and the potential benefits and risks involved with this procedure.  The benefits include less needle sticks, lab draws from the catheter, and the patient may be discharged home with the catheter. Risks include, but not limited to, infection, bleeding, blood clot (thrombus formation), and puncture of an artery; nerve damage and irregular heartbeat and possibility to perform a PICC exchange if needed/ordered by physician.  Alternatives to this procedure were also discussed.  Bard Power PICC patient education guide, fact sheet on infection prevention and patient information card has been provided to patient /or left at bedside.    PICC Placement Documentation  PICC Single Lumen 01/28/23 Right Basilic 36 cm 0 cm (Active)  Indication for Insertion or Continuance of Line Home intravenous therapies (PICC only) 01/28/23 1300  Exposed Catheter (cm) 0 cm 01/28/23 1300  Site Assessment Clean, Dry, Intact 01/28/23 1300  Line Status Flushed;Saline locked;Blood return noted 01/28/23 1300  Dressing Type Transparent;Securing device 01/28/23 1300  Dressing Status Antimicrobial disc in place;Clean, Dry, Intact 01/28/23 1300  Line Care Connections checked and tightened 01/28/23 1300  Line Adjustment (NICU/IV Team Only) No 01/28/23 1300  Dressing Intervention New dressing 01/28/23 1300  Dressing Change Due 02/04/23 01/28/23 1300       Franne Grip Renee 01/28/2023, 1:29 PM

## 2023-01-28 NOTE — Hospital Course (Addendum)
89 year old male who presents from home (where he lives alone) with prostate cancer, chronic suprapubic catheter, HTN, history of ESBL UTI who comes in from home due to generalized weakness and increased frequency of falls. He was septic in the ED with fever of 101.3, tachycardic, and urinalysis positive for pyuria. He was started on meropenem and admitted to the hospital.  Patient admitted and treated for sepsis blood culture with MSSA bacteremia, also UTI, suprapubic catheter in place likely the source of UTI, TTE without particular concern repeat cultures remain negative, seen by ID did not recommend TEE advised prolonged IV antibiotics course x 4 wk. seen by orthopedics no evidence of shoulder infection  and attempt at aspiration unsuccessful  on 12/2.

## 2023-01-28 NOTE — Plan of Care (Signed)

## 2023-01-28 NOTE — TOC Progression Note (Addendum)
Transition of Care (TOC) - Progression Note    Patient Details  Name: Andre Casey. MRN: [id number] Date of Birth: 06-26-1929  Transition of Care [facility]) CM/SW Contact  Margarine Grosshans A [place], [place] Phone Number: 01/28/2023, 12:33 PM  Clinical Narrative:     Update 1605 [facility] of  can provider bed offer. CSW notified daughter Rosalita Chessman to confirm that they want to accept bed offer. CSW contact 2x with no answer, left VM. CSW will confirm with facility once daughter confirms with CSW. Auth to be started as pt nearing medical stability.  CSW contacted pt's daughter, Rosalita Chessman, she stated that she was going to reach out to pt's granddaughter and make decision on bed offers today. CSW reached out to [facility] to find out if they could offer bed, CSW waiting for reply. CSW to follow up with pt's family with bed choice. Pt nearing medical stability, auth to be started once bed decision finalized.    TOC will continue to follow.   Expected Discharge Plan: Skilled Nursing Facility Barriers to Discharge: Continued Medical Work up, English as a second language teacher, SNF Pending bed offer  Expected Discharge Plan and Services In-house Referral: Clinical Social Work                                             Social Determinants of Health (SDOH) Interventions SDOH Screenings   Food Insecurity: Patient Declined (01/24/2023)  Transportation Needs: Patient Declined (01/24/2023)  Utilities: Patient Declined (01/24/2023)  Tobacco Use: Medium Risk (01/23/2023)    Readmission Risk Interventions     No data to display

## 2023-01-28 NOTE — Progress Notes (Signed)
PROGRESS NOTE [name].  ZOX:[id number] DOB: 03-Dec-1929 DOA: 01/23/2023 PCP: Lupita Raider, MD  Brief Narrative/Hospital Course: 89 year old male who presents from home (where he lives alone) with prostate cancer, chronic suprapubic catheter, HTN, history of ESBL UTI who comes in from home due to generalized weakness and increased frequency of falls. He was septic in the ED with fever of 101.3, tachycardic, and urinalysis positive for pyuria. He was started on meropenem and admitted to the hospital.  Patient admitted and treated for sepsis blood culture with MSSA bacteremia, also UTI, suprapubic catheter in place likely the source of UTI, TTE without particular concern repeat cultures remain negative, seen by ID did not recommend TEE advised prolonged IV antibiotics course x 4 wk. seen by orthopedics no evidence of shoulder infection  and attempt at aspiration unsuccessful  on 12/2.     Subjective: Patient seen and examined this morning Resting comfortably at the bedside chair, no complaints no fever no chills Agreeable for PICC line  Assessment and Plan: Principal Problem:   Generalized weakness   MSSA Sepsis/bacteremia UTI POA due to SP catheter-2/2 ESBL Klebsiella: Prosthetic right shoulder-no concern for inspection, tap was dry 12/2: Blood culture positive on 11/29, repeat blood culture 12/1 NGTD.  Seen by ID, orthopedics. TTE without particular concern repeat cultures remain negative, seen by ID did not recommend TEE advised prolonged IV antibiotics course x 4 wk. Plan for PICC line placement  today then cefazolin 2 g IV every 12 hours x 4 weeks (end date 02/21/2023) CT chest abdomen pelvis and face without overt or clear infectious source   Renal mass, unspecified  Initially noted at [facility] in October, appears stable on repeat imaging here Plan at that time was to follow-up with outpatient urology and oncology   Generalized weakness secondary to above Moderate to  severe protein caloric malnutrition Continue PT OT fall precaution augment diet   AKI: Prerenal with poor intake, creatinine improved.   Hypokalemia Resolved   Mild hypovolemic hyponatremia Mild non-anion gap metabolic acidosis: Resolved.   QTc prolongation EKG QTc 524 Avoid QTc prolonging agents   Elevated troponin, not clinically relevant  DVT prophylaxis: enoxaparin (LOVENOX) injection 30 mg Start: 01/23/23 2130 Code Status:   Code Status: Limited: Do not attempt resuscitation (DNR) -DNR-LIMITED -Do Not Intubate/DNI  Family Communication: plan of care discussed with patient/ none at bedside. Patient status is:  inpatient because of sepsis Level of care: Telemetry Medical   Dispo: The patient is from: home             Anticipated disposition: SNF once bed available, she will be ready for discharge 12/5  Objective: Vitals last 24 hrs: Vitals:   01/27/23 1559 01/27/23 2106 01/28/23 0336 01/28/23 0500  BP: 122/85 112/62 (!) 150/80   Pulse: 79 75 88   Resp: 18  18   Temp: 98 F (36.7 C) 97.9 F (36.6 C) 98.5 F (36.9 C)   TempSrc:  Oral Oral   SpO2: 99% 96% 99%   Weight:    55.9 kg  Height:       Weight change: -3.2 kg  Physical Examination: General exam: alert awake, older than stated age HEENT:Oral mucosa moist, Ear/Nose WNL grossly Respiratory system: bilaterally clear BS, no use of accessory muscle Cardiovascular system: S1 & S2 +, No JVD. Gastrointestinal system: Abdomen soft,NT,ND, BS+ Nervous System:Alert, awake, moving extremities. Extremities: LE edema neg,distal peripheral pulses palpable.  Skin: No rashes,no icterus. MSK: Normal muscle bulk,tone, power  Medications reviewed:  Scheduled Meds:  bupivacaine(PF)  10 mL Infiltration Once   diclofenac Sodium  4 g Topical QID   enoxaparin (LOVENOX) injection  30 mg Subcutaneous Q24H   feeding supplement  237 mL Oral BID BM   Continuous Infusions:   ceFAZolin (ANCEF) IV 2 g (01/28/23 1000)    Diet  Order             Diet Heart Fluid consistency: Thin  Diet effective now                  Intake/Output Summary (Last 24 hours) at 01/28/2023 1250 Last data filed at 01/28/2023 0600 Gross per 24 hour  Intake 100 ml  Output 1500 ml  Net -1400 ml   Net IO Since Admission: -1,764.96 mL [01/28/23 1250]  Wt Readings from Last 3 Encounters:  01/28/23 55.9 kg  04/25/22 58.3 kg  12/12/21 59.9 kg     Unresulted Labs (From admission, onward)     Start     Ordered   01/30/23 0500  Creatinine, serum  (enoxaparin (LOVENOX)    CrCl < 30 ml/min)  Once,   R       Comments: while on enoxaparin therapy.    01/23/23 2129   01/26/23 1126  Synovial cell count + diff, w/ crystals  Once,   R        01/26/23 1125   01/26/23 1125  Body fluid culture w Gram Stain  Once,   R       Question:  Are there also cytology or pathology orders on this specimen?  Answer:  Yes   01/26/23 1125   01/26/23 0500  CBC  Every 48 hours,   R      01/25/23 1248   01/26/23 0500  Basic metabolic panel  Every 48 hours,   R      01/25/23 1248          Data Reviewed: I have personally reviewed following labs and imaging studies CBC: Recent Labs  Lab 01/23/23 1734 01/24/23 0553 01/26/23 0428 01/28/23 0721  WBC 13.8* 13.8* 13.5* 11.6*  HGB 10.3* 9.7* 10.4* 11.7*  HCT 31.6* 29.3* 32.7* 35.9*  MCV 88.5 85.4 87.4 88.0  PLT 285 250 318 617*   Basic Metabolic Panel:  Recent Labs  Lab 01/23/23 1734 01/24/23 0553 01/25/23 0837 01/26/23 0428 01/28/23 0721  NA 132* 132* 135 135 137  K 2.8* 3.2* 3.2* 3.6 3.5  CL 99 102 103 109 105  CO2 21* 18* 17* 19* 23  GLUCOSE 124* 107* 103* 156* 117*  BUN 37* 38* 28* 26* 29*  CREATININE 2.54* 2.29* 1.83* 1.36* 1.33*  CALCIUM 8.0* 7.9* 8.2* 8.1* 8.3*  MG  --  2.1  --   --   --   PHOS  --  1.6*  --   --   --    GFR: Estimated Creatinine Clearance: 28.6 mL/min (A) (by C-G formula based on SCr of 1.33 mg/dL (H)). Liver Function Tests:  Recent Labs  Lab 01/23/23 1734   AST 37  ALT 18  ALKPHOS 74  BILITOT 1.0  PROT 5.7*  ALBUMIN 2.2*   Recent Labs  Lab 01/23/23 1734  LIPASE 21   No results for input(s): "AMMONIA" in the last 168 hours. Sepsis Labs: Recent Labs  Lab 01/23/23 2021  LATICACIDVEN 1.0    Recent Results (from the past 240 hour(s))  Urine Culture     Status: Abnormal   Collection Time: 01/23/23  7:32 PM  Specimen: Urine, Suprapubic  Result Value Ref Range Status   Specimen Description URINE, SUPRAPUBIC  Final   Special Requests NONE  Final   Culture (A)  Final    20,000 COLONIES/mL KLEBSIELLA PNEUMONIAE >=100,000 COLONIES/mL STAPHYLOCOCCUS AUREUS >=100,000 COLONIES/mL AEROCOCCUS SPECIES Standardized susceptibility testing for this organism is not available. Performed at [facility], [address] [place], [place] [address]    Report Status 01/26/2023 FINAL  Final   Organism ID, Bacteria KLEBSIELLA PNEUMONIAE (A)  Final   Organism ID, Bacteria STAPHYLOCOCCUS AUREUS (A)  Final      Susceptibility   Klebsiella pneumoniae - MIC*    AMPICILLIN RESISTANT Resistant     CEFAZOLIN <=4 SENSITIVE Sensitive     CEFEPIME <=0.12 SENSITIVE Sensitive     CEFTRIAXONE <=0.25 SENSITIVE Sensitive     CIPROFLOXACIN <=0.25 SENSITIVE Sensitive     GENTAMICIN <=1 SENSITIVE Sensitive     IMIPENEM <=0.25 SENSITIVE Sensitive     NITROFURANTOIN 64 INTERMEDIATE Intermediate     TRIMETH/SULFA <=20 SENSITIVE Sensitive     AMPICILLIN/SULBACTAM 4 SENSITIVE Sensitive     PIP/TAZO <=4 SENSITIVE Sensitive ug/mL    * 20,000 COLONIES/mL KLEBSIELLA PNEUMONIAE   Staphylococcus aureus - MIC*    CIPROFLOXACIN <=0.5 SENSITIVE Sensitive     GENTAMICIN <=0.5 SENSITIVE Sensitive     NITROFURANTOIN 32 SENSITIVE Sensitive     OXACILLIN 0.5 SENSITIVE Sensitive     TETRACYCLINE <=1 SENSITIVE Sensitive     VANCOMYCIN 1 SENSITIVE Sensitive     TRIMETH/SULFA <=10 SENSITIVE Sensitive     CLINDAMYCIN <=0.25 SENSITIVE Sensitive     RIFAMPIN <=0.5 SENSITIVE  Sensitive     Inducible Clindamycin NEGATIVE Sensitive     LINEZOLID 2 SENSITIVE Sensitive     * >=100,000 COLONIES/mL STAPHYLOCOCCUS AUREUS  Culture, blood (routine x 2)     Status: Abnormal   Collection Time: 01/23/23  9:17 PM   Specimen: BLOOD  Result Value Ref Range Status   Specimen Description BLOOD RIGHT ANTECUBITAL  Final   Special Requests   Final    BOTTLES DRAWN AEROBIC AND ANAEROBIC Blood Culture adequate volume   Culture  Setup Time   Final    GRAM POSITIVE COCCI IN BOTH AEROBIC AND ANAEROBIC BOTTLES CRITICAL RESULT CALLED TO, READ BACK BY AND VERIFIED WITH: PHARMD JESSICA MILLEN [phone] [id number] BY Berline Chough, [place] Performed at [facility], [address], [place], [place] [address]    Culture STAPHYLOCOCCUS AUREUS (A)  Final   Report Status 01/26/2023 FINAL  Final   Organism ID, Bacteria STAPHYLOCOCCUS AUREUS  Final      Susceptibility   Staphylococcus aureus - MIC*    CIPROFLOXACIN <=0.5 SENSITIVE Sensitive     ERYTHROMYCIN <=0.25 SENSITIVE Sensitive     GENTAMICIN <=0.5 SENSITIVE Sensitive     OXACILLIN <=0.25 SENSITIVE Sensitive     TETRACYCLINE <=1 SENSITIVE Sensitive     VANCOMYCIN 1 SENSITIVE Sensitive     TRIMETH/SULFA <=10 SENSITIVE Sensitive     CLINDAMYCIN <=0.25 SENSITIVE Sensitive     RIFAMPIN <=0.5 SENSITIVE Sensitive     Inducible Clindamycin NEGATIVE Sensitive     LINEZOLID 2 SENSITIVE Sensitive     * STAPHYLOCOCCUS AUREUS  Culture, blood (routine x 2)     Status: Abnormal   Collection Time: 01/23/23  9:17 PM   Specimen: BLOOD LEFT HAND  Result Value Ref Range Status   Specimen Description BLOOD LEFT HAND  Final   Special Requests   Final  BOTTLES DRAWN AEROBIC AND ANAEROBIC Blood Culture adequate volume   Culture  Setup Time   Final    GRAM POSITIVE COCCI IN BOTH AEROBIC AND ANAEROBIC BOTTLES    Culture (A)  Final    STAPHYLOCOCCUS AUREUS SUSCEPTIBILITIES PERFORMED ON PREVIOUS CULTURE WITHIN THE LAST 5 DAYS. Performed at [facility], [address], [place], [place] [address]    Report Status 01/26/2023 FINAL  Final  Blood Culture ID Panel (Reflexed)     Status: Abnormal   Collection Time: 01/23/23  9:17 PM  Result Value Ref Range Status   Enterococcus faecalis NOT DETECTED NOT DETECTED Final   Enterococcus Faecium NOT DETECTED NOT DETECTED Final   Listeria monocytogenes NOT DETECTED NOT DETECTED Final   Staphylococcus species DETECTED (A) NOT DETECTED Final    Comment: CRITICAL RESULT CALLED TO, READ BACK BY AND VERIFIED WITH: PHARMD JESSICA MILLEN [phone] [id number] BY J RAZZAK, [place]    Staphylococcus aureus (BCID) DETECTED (A) NOT DETECTED Final    Comment: CRITICAL RESULT CALLED TO, READ BACK BY AND VERIFIED WITH: PHARMD JESSICA MILLEN [phone] [id number] BY J RAZZAK, MT    Staphylococcus epidermidis NOT DETECTED NOT DETECTED Final   Staphylococcus lugdunensis NOT DETECTED NOT DETECTED Final   Streptococcus species NOT DETECTED NOT DETECTED Final   Streptococcus agalactiae NOT DETECTED NOT DETECTED Final   Streptococcus pneumoniae NOT DETECTED NOT DETECTED Final   Streptococcus pyogenes NOT DETECTED NOT DETECTED Final   A.calcoaceticus-baumannii NOT DETECTED NOT DETECTED Final   Bacteroides fragilis NOT DETECTED NOT DETECTED Final   Enterobacterales NOT DETECTED NOT DETECTED Final   Enterobacter cloacae complex NOT DETECTED NOT DETECTED Final   Escherichia coli NOT DETECTED NOT DETECTED Final   Klebsiella aerogenes NOT DETECTED NOT DETECTED Final   Klebsiella oxytoca NOT DETECTED NOT DETECTED Final   Klebsiella pneumoniae NOT DETECTED NOT DETECTED Final   Proteus species NOT DETECTED NOT DETECTED Final   Salmonella species NOT DETECTED NOT DETECTED Final   Serratia marcescens NOT DETECTED NOT DETECTED Final   Haemophilus influenzae NOT DETECTED NOT DETECTED Final   Neisseria meningitidis NOT DETECTED NOT DETECTED Final   Pseudomonas aeruginosa NOT DETECTED NOT DETECTED Final   Stenotrophomonas maltophilia NOT  DETECTED NOT DETECTED Final   Candida albicans NOT DETECTED NOT DETECTED Final   Candida auris NOT DETECTED NOT DETECTED Final   Candida glabrata NOT DETECTED NOT DETECTED Final   Candida krusei NOT DETECTED NOT DETECTED Final   Candida parapsilosis NOT DETECTED NOT DETECTED Final   Candida tropicalis NOT DETECTED NOT DETECTED Final   Cryptococcus neoformans/gattii NOT DETECTED NOT DETECTED Final   Meth resistant mecA/C and MREJ NOT DETECTED NOT DETECTED Final    Comment: Performed at [facility], [address], [place], [place] [address]  Culture, blood (Routine X 2) w Reflex to ID Panel     Status: None (Preliminary result)   Collection Time: 01/25/23  9:52 AM   Specimen: BLOOD RIGHT ARM  Result Value Ref Range Status   Specimen Description BLOOD RIGHT ARM  Final   Special Requests   Final    BOTTLES DRAWN AEROBIC AND ANAEROBIC Blood Culture results may not be optimal due to an inadequate volume of blood received in culture bottles   Culture   Final    NO GROWTH 3 DAYS Performed at [facility], [address] [place], [place] [address]    Report Status PENDING  Incomplete  Culture, blood (Routine X 2) w Reflex to  ID Panel     Status: None (Preliminary result)   Collection Time: 01/25/23  9:52 AM   Specimen: BLOOD RIGHT HAND  Result Value Ref Range Status   Specimen Description BLOOD RIGHT HAND  Final   Special Requests   Final    BOTTLES DRAWN AEROBIC AND ANAEROBIC Blood Culture results may not be optimal due to an inadequate volume of blood received in culture bottles   Culture   Final    NO GROWTH 3 DAYS Performed at [facility], [address] [place], [place] [address]    Report Status PENDING  Incomplete    Antimicrobials: Anti-infectives (From admission, onward)    Start     Dose/Rate Route Frequency Ordered Stop   01/28/23 0000  ceFAZolin (ANCEF) IVPB        2 g Intravenous Every 12 hours 01/28/23 1048 02/23/23 2359   01/25/23 1000  ceFAZolin  (ANCEF) IVPB 2g/100 mL premix        2 g 200 mL/hr over 30 Minutes Intravenous Every 12 hours 01/25/23 0815     01/25/23 0812  ertapenem (INVANZ) 500 mg in sodium chloride 0.9 % 50 mL IVPB  Status:  Discontinued        500 mg 100 mL/hr over 30 Minutes Intravenous Every 24 hours 01/25/23 0812 01/25/23 1952   01/25/23 0800  ertapenem (INVANZ) 1 g in sodium chloride 0.9 % 100 mL IVPB  Status:  Discontinued        1 g 200 mL/hr over 30 Minutes Intravenous Every 24 hours 01/25/23 0700 01/25/23 0812   01/25/23 0745  ertapenem [facility]) injection 1 g  Status:  Discontinued        1 g Intramuscular Every 24 hours 01/25/23 0655 01/25/23 0700   01/23/23 2200  meropenem (MERREM) 500 mg in sodium chloride 0.9 % 100 mL IVPB  Status:  Discontinued        500 mg 200 mL/hr over 30 Minutes Intravenous Every 8 hours 01/23/23 2055 01/23/23 2109   01/23/23 2200  meropenem (MERREM) 500 mg in sodium chloride 0.9 % 100 mL IVPB  Status:  Discontinued        500 mg 200 mL/hr over 30 Minutes Intravenous Every 12 hours 01/23/23 2109 01/25/23 0655      Culture/Microbiology    Component Value Date/Time   SDES BLOOD RIGHT ARM 01/25/2023 0952   SDES BLOOD RIGHT HAND 01/25/2023 0952   SPECREQUEST  01/25/2023 2956    BOTTLES DRAWN AEROBIC AND ANAEROBIC Blood Culture results may not be optimal due to an inadequate volume of blood received in culture bottles   SPECREQUEST  01/25/2023 0952    BOTTLES DRAWN AEROBIC AND ANAEROBIC Blood Culture results may not be optimal due to an inadequate volume of blood received in culture bottles   CULT  01/25/2023 0952    NO GROWTH 3 DAYS Performed at [facility], [address] [place], [place] [address]    CULT  01/25/2023 [phone]    NO GROWTH 3 DAYS Performed at [facility], [address], [place], [place] [address]    REPTSTATUS PENDING 01/25/2023 9528   REPTSTATUS PENDING 01/25/2023 4132   Radiology Studies: [place] EKG SITE RITE  Result Date: 01/28/2023 If  Site Rite image not attached, placement could not be confirmed due to current cardiac rhythm.    LOS: 5 days   Total time spent in review of labs and imaging, patient evaluation, formulation of plan, documentation and  communication with family: 35 minutes  Lanae Boast, MD [facility]  01/28/2023, 12:50 PM

## 2023-01-29 DIAGNOSIS — R531 Weakness: Secondary | ICD-10-CM | POA: Diagnosis not present

## 2023-01-29 LAB — MRSA NEXT GEN BY PCR, NASAL: MRSA by PCR Next Gen: NOT DETECTED

## 2023-01-29 NOTE — Discharge Summary (Addendum)
Physician Discharge Summary  Andre Casey. WGN:[id number] DOB: [birth year] DOA: 01/23/2023  PCP: Lupita Raider, MD  Admit date: 01/23/2023 Discharge date: 01/30/2023 Recommendations for Outpatient Follow-up:  Follow up with PCP in 1 weeks-call for appointment Please obtain BMP/CBC in one week Follow-up with infectious disease as outpatient  Discharge Dispo: SNF Discharge Condition: Stable Code Status:   Code Status: Limited: Do not attempt resuscitation (DNR) -DNR-LIMITED -Do Not Intubate/DNI  Diet recommendation:  Diet Order             Diet Heart Fluid consistency: Thin  Diet effective now                    Brief/Interim Summary: 89 year old male who presents from home (where he lives alone) with prostate cancer, chronic suprapubic catheter, HTN, history of ESBL UTI who comes in from home due to generalized weakness and increased frequency of falls. He was septic in the ED with fever of 101.3, tachycardic, and urinalysis positive for pyuria. He was started on meropenem and admitted to the hospital.  Patient admitted and treated for sepsis blood culture with MSSA bacteremia, also UTI, suprapubic catheter in place likely the source of UTI, TTE without particular concern repeat cultures remain negative, seen by ID did not recommend TEE advised prolonged IV antibiotics course x 4 wk. seen by orthopedics no evidence of shoulder infection  and attempt at aspiration unsuccessful  on 12/2.    Discharge Diagnoses:  Principal Problem:   Generalized weakness  MSSA Sepsis/bacteremia UTI POA due to SP catheter-2/2 ESBL Klebsiella: Prosthetic right shoulder-no concern for inspection, tap was dry 12/2: Blood culture positive on 11/29, repeat blood culture 12/1 NGTD.  Seen by ID, orthopedics. TTE without particular concern repeat cultures remain negative, seen by ID did not recommend TEE advised prolonged IV antibiotics course x 4 wk. S/P PICC line 12/4 -continue Cefazolin 2 g IV  q12 hr x 4 weeks (end date 02/21/2023) CT chest abdomen pelvis and face without overt or clear infectious source   Renal mass, unspecified  Initially noted at [facility] in October, appears stable on repeat imaging here Plan at that time was to follow-up with outpatient urology and oncology   Generalized weakness secondary to above Moderate to severe protein caloric malnutrition Continue PT OT fall precaution augment diet, plan for SNF   AKI: Prerenal with poor intake, creatinine improved. Recent Labs    01/23/23 1734 01/24/23 0553 01/25/23 0837 01/26/23 0428 01/28/23 0721 01/30/23 0242  BUN 37* 38* 28* 26* 29* 23  CREATININE 2.54* 2.29* 1.83* 1.36* 1.33* 1.24  CO2 21* 18* 17* 19* 23 22  K 2.8* 3.2* 3.2* 3.6 3.5 3.8    Hypokalemia Resolved   Mild hypovolemic hyponatremia Mild non-anion gap metabolic acidosis: Resolved.   QTc prolongation EKG QTc 524 Avoid QTc prolonging agents   Elevated troponin, not clinically relevant  Consults: ID ORTHOPEDICS   Subjective: Seen and examined, alert awake resting comfortably no fever overnight,  He will have a SNF bed today and is stable for discharge   Discharge Exam: Vitals:   01/29/23 2020 01/30/23 0509  BP: 132/71 131/75  Pulse: 84 78  Resp: 18 18  Temp: 98.9 F (37.2 C) 98.1 F (36.7 C)  SpO2: 97% 95%   General: Pt is alert, awake, not in acute distress Cardiovascular: RRR, S1/S2 +, no rubs, no gallops Respiratory: CTA bilaterally, no wheezing, no rhonchi Abdominal: Soft, NT, ND, bowel sounds + Extremities: no edema, no cyanosis  Discharge Instructions  Discharge Instructions     [facility] pharmacist to adjust dose for Vancomycin, Aminoglycosides and other anti-infective therapies as requested by physician.   Complete by: As directed    [facility] to provide Cath Flo 2mg    Complete by: As directed    Administer for PICC line occlusion and as ordered by physician for other access  device issues.   Anaphylaxis Kit: Provided to treat any anaphylactic reaction to the medication being provided to the patient if First Dose or when requested by physician   Complete by: As directed    Epinephrine 1mg /ml vial / amp: Administer 0.3mg  (0.92ml) subcutaneously once for moderate to severe anaphylaxis, nurse to call physician and pharmacy when reaction occurs and call 911 if needed for immediate care   Diphenhydramine 50mg /ml IV vial: Administer 25-50mg  IV/IM PRN for first dose reaction, rash, itching, mild reaction, nurse to call physician and pharmacy when reaction occurs   Sodium Chloride 0.9% NS IV: Administer if needed for hypovolemic blood pressure drop or as ordered by physician after call to physician with anaphylactic reaction   Change dressing on IV access line weekly and PRN   Complete by: As directed    Discharge instructions   Complete by: As directed    Please call call MD or return to ER for similar or worsening recurring problem that brought you to hospital or if any fever,nausea/vomiting,abdominal pain, uncontrolled pain, chest pain,  shortness of breath or any other alarming symptoms.  Please follow-up your doctor as instructed in a week time and call the office for appointment.  Please avoid alcohol, smoking, or any other illicit substance and maintain healthy habits including taking your regular medications as prescribed.  You were cared for by a hospitalist during your hospital stay. If you have any questions about your discharge medications or the care you received while you were in the hospital after you are discharged, you can call the unit and ask to speak with the hospitalist on call if the hospitalist that took care of you is not available.  Once you are discharged, your primary care physician will handle any further medical issues. Please note that NO REFILLS for any discharge medications will be authorized once you are discharged, as it is imperative that  you return to your primary care physician (or establish a relationship with a primary care physician if you do not have one) for your aftercare needs so that they can reassess your need for medications and monitor your lab values   Flush IV access with Sodium Chloride 0.9% and Heparin 10 units/ml or 100 units/ml   Complete by: As directed    Home infusion instructions - [facility]   Complete by: As directed    Instructions: Flush IV access with Sodium Chloride 0.9% and Heparin 10units/ml or 100units/ml   Change dressing on IV access line: Weekly and PRN   Instructions Cath Flo 2mg : Administer for PICC Line occlusion and as ordered by physician for other access device   [facility] pharmacist to adjust dose for: Vancomycin, Aminoglycosides and other anti-infective therapies as requested by physician   Increase activity slowly   Complete by: As directed    Method of administration may be changed at the discretion of home infusion pharmacist based upon assessment of the patient and/or caregiver's ability to self-administer the medication ordered   Complete by: As directed    No wound care   Complete by: As directed  Allergies as of 01/30/2023       Reactions   Aricept [donepezil] Nausea Only   Report upset stomach - unable to tolerate.   Namzaric [memantine Hcl-donepezil Hcl] Other (See Comments)   Stomach upset  Pt able to take Memantine by itself.        Medication List     STOP taking these medications    amLODipine 10 MG tablet Commonly known as: NORVASC       TAKE these medications    acetaminophen 500 MG tablet Commonly known as: TYLENOL Take 2 tablets (1,000 mg total) by mouth 3 (three) times daily. What changed:  how much to take when to take this   aspirin EC 81 MG tablet Take 1 tablet (81 mg total) by mouth daily. Swallow whole.   ceFAZolin IVPB Commonly known as: ANCEF Inject 2 g into the vein every 12 (twelve) hours for 26  days. Indication:  MSSA bacteremia First Dose: Yes Last Day of Therapy:  02/22/23 Labs - Once weekly:  CBC/D and BMP, Labs - Once weekly: ESR and CRP Method of administration: IV Push Method of administration may be changed at the discretion of home infusion pharmacist based upon assessment of the patient and/or caregiver's ability to self-administer the medication ordered.   feeding supplement Liqd Take 237 mLs by mouth 2 (two) times daily between meals.   ipratropium-albuterol 0.5-2.5 (3) MG/3ML Soln Commonly known as: DUONEB Take 3 mLs by nebulization every 4 (four) hours as needed (SOB and wheezing).   melatonin 5 MG Tabs Take 1 tablet (5 mg total) by mouth at bedtime as needed.   memantine 10 MG tablet Commonly known as: Namenda Take 1 tablet (10 mg total) by mouth 2 (two) times daily.   mirtazapine 15 MG tablet Commonly known as: REMERON Take 15 mg by mouth daily.   multivitamin with minerals Tabs tablet Take 1 tablet by mouth daily.   PROBIOTIC PO Take 1 tablet by mouth daily with lunch.   traMADol 50 MG tablet Commonly known as: ULTRAM Take 1 tablet (50 mg total) by mouth every 12 (twelve) hours as needed for up to 4 doses. What changed:  when to take this reasons to take this               Discharge Care Instructions  (From admission, onward)           Start     Ordered   01/28/23 0000  Change dressing on IV access line weekly and PRN  (Home infusion instructions - [facility] )        01/28/23 1048            Follow-up Information     Lupita Raider, MD Follow up in 1 week(s).   Specialty: Family Medicine Contact information: [address] [place] [address] [phone]                Allergies  Allergen Reactions   Aricept [Donepezil] Nausea Only    Report upset stomach - unable to tolerate.   Namzaric [Memantine Hcl-Donepezil Hcl] Other (See Comments)    Stomach upset  Pt able to take  Memantine by itself.    The results of significant diagnostics from this hospitalization (including imaging, microbiology, ancillary and laboratory) are listed below for reference.    Microbiology: Recent Results (from the past 240 hour(s))  Urine Culture     Status: Abnormal   Collection Time: 01/23/23  7:32 PM  Specimen: Urine, Suprapubic  Result Value Ref Range Status   Specimen Description URINE, SUPRAPUBIC  Final   Special Requests NONE  Final   Culture (A)  Final    20,000 COLONIES/mL KLEBSIELLA PNEUMONIAE >=100,000 COLONIES/mL STAPHYLOCOCCUS AUREUS >=100,000 COLONIES/mL AEROCOCCUS SPECIES Standardized susceptibility testing for this organism is not available. Performed at [facility], [address], [place], [place] [address]    Report Status 01/26/2023 FINAL  Final   Organism ID, Bacteria KLEBSIELLA PNEUMONIAE (A)  Final   Organism ID, Bacteria STAPHYLOCOCCUS AUREUS (A)  Final      Susceptibility   Klebsiella pneumoniae - MIC*    AMPICILLIN RESISTANT Resistant     CEFAZOLIN <=4 SENSITIVE Sensitive     CEFEPIME <=0.12 SENSITIVE Sensitive     CEFTRIAXONE <=0.25 SENSITIVE Sensitive     CIPROFLOXACIN <=0.25 SENSITIVE Sensitive     GENTAMICIN <=1 SENSITIVE Sensitive     IMIPENEM <=0.25 SENSITIVE Sensitive     NITROFURANTOIN 64 INTERMEDIATE Intermediate     TRIMETH/SULFA <=20 SENSITIVE Sensitive     AMPICILLIN/SULBACTAM 4 SENSITIVE Sensitive     PIP/TAZO <=4 SENSITIVE Sensitive ug/mL    * 20,000 COLONIES/mL KLEBSIELLA PNEUMONIAE   Staphylococcus aureus - MIC*    CIPROFLOXACIN <=0.5 SENSITIVE Sensitive     GENTAMICIN <=0.5 SENSITIVE Sensitive     NITROFURANTOIN 32 SENSITIVE Sensitive     OXACILLIN 0.5 SENSITIVE Sensitive     TETRACYCLINE <=1 SENSITIVE Sensitive     VANCOMYCIN 1 SENSITIVE Sensitive     TRIMETH/SULFA <=10 SENSITIVE Sensitive     CLINDAMYCIN <=0.25 SENSITIVE Sensitive     RIFAMPIN <=0.5 SENSITIVE Sensitive     Inducible Clindamycin NEGATIVE  Sensitive     LINEZOLID 2 SENSITIVE Sensitive     * >=100,000 COLONIES/mL STAPHYLOCOCCUS AUREUS  Culture, blood (routine x 2)     Status: Abnormal   Collection Time: 01/23/23  9:17 PM   Specimen: BLOOD  Result Value Ref Range Status   Specimen Description BLOOD RIGHT ANTECUBITAL  Final   Special Requests   Final    BOTTLES DRAWN AEROBIC AND ANAEROBIC Blood Culture adequate volume   Culture  Setup Time   Final    GRAM POSITIVE COCCI IN BOTH AEROBIC AND ANAEROBIC BOTTLES CRITICAL RESULT CALLED TO, READ BACK BY AND VERIFIED WITH: PHARMD JESSICA MILLEN [phone] [id number] BY Berline Chough, [place] Performed at [facility], [address] [place], [place] [address]    Culture STAPHYLOCOCCUS AUREUS (A)  Final   Report Status 01/26/2023 FINAL  Final   Organism ID, Bacteria STAPHYLOCOCCUS AUREUS  Final      Susceptibility   Staphylococcus aureus - MIC*    CIPROFLOXACIN <=0.5 SENSITIVE Sensitive     ERYTHROMYCIN <=0.25 SENSITIVE Sensitive     GENTAMICIN <=0.5 SENSITIVE Sensitive     OXACILLIN <=0.25 SENSITIVE Sensitive     TETRACYCLINE <=1 SENSITIVE Sensitive     VANCOMYCIN 1 SENSITIVE Sensitive     TRIMETH/SULFA <=10 SENSITIVE Sensitive     CLINDAMYCIN <=0.25 SENSITIVE Sensitive     RIFAMPIN <=0.5 SENSITIVE Sensitive     Inducible Clindamycin NEGATIVE Sensitive     LINEZOLID 2 SENSITIVE Sensitive     * STAPHYLOCOCCUS AUREUS  Culture, blood (routine x 2)     Status: Abnormal   Collection Time: 01/23/23  9:17 PM   Specimen: BLOOD LEFT HAND  Result Value Ref Range Status   Specimen Description BLOOD LEFT HAND  Final   Special Requests   Final  BOTTLES DRAWN AEROBIC AND ANAEROBIC Blood Culture adequate volume   Culture  Setup Time   Final    GRAM POSITIVE COCCI IN BOTH AEROBIC AND ANAEROBIC BOTTLES    Culture (A)  Final    STAPHYLOCOCCUS AUREUS SUSCEPTIBILITIES PERFORMED ON PREVIOUS CULTURE WITHIN THE LAST 5 DAYS. Performed at [facility], [address], [place], [place]  [address]    Report Status 01/26/2023 FINAL  Final  Blood Culture ID Panel (Reflexed)     Status: Abnormal   Collection Time: 01/23/23  9:17 PM  Result Value Ref Range Status   Enterococcus faecalis NOT DETECTED NOT DETECTED Final   Enterococcus Faecium NOT DETECTED NOT DETECTED Final   Listeria monocytogenes NOT DETECTED NOT DETECTED Final   Staphylococcus species DETECTED (A) NOT DETECTED Final    Comment: CRITICAL RESULT CALLED TO, READ BACK BY AND VERIFIED WITH: PHARMD JESSICA MILLEN [phone] [id number] BY J RAZZAK, MT    Staphylococcus aureus (BCID) DETECTED (A) NOT DETECTED Final    Comment: CRITICAL RESULT CALLED TO, READ BACK BY AND VERIFIED WITH: PHARMD JESSICA MILLEN [phone] [id number] BY J RAZZAK, MT    Staphylococcus epidermidis NOT DETECTED NOT DETECTED Final   Staphylococcus lugdunensis NOT DETECTED NOT DETECTED Final   Streptococcus species NOT DETECTED NOT DETECTED Final   Streptococcus agalactiae NOT DETECTED NOT DETECTED Final   Streptococcus pneumoniae NOT DETECTED NOT DETECTED Final   Streptococcus pyogenes NOT DETECTED NOT DETECTED Final   A.calcoaceticus-baumannii NOT DETECTED NOT DETECTED Final   Bacteroides fragilis NOT DETECTED NOT DETECTED Final   Enterobacterales NOT DETECTED NOT DETECTED Final   Enterobacter cloacae complex NOT DETECTED NOT DETECTED Final   Escherichia coli NOT DETECTED NOT DETECTED Final   Klebsiella aerogenes NOT DETECTED NOT DETECTED Final   Klebsiella oxytoca NOT DETECTED NOT DETECTED Final   Klebsiella pneumoniae NOT DETECTED NOT DETECTED Final   Proteus species NOT DETECTED NOT DETECTED Final   Salmonella species NOT DETECTED NOT DETECTED Final   Serratia marcescens NOT DETECTED NOT DETECTED Final   Haemophilus influenzae NOT DETECTED NOT DETECTED Final   Neisseria meningitidis NOT DETECTED NOT DETECTED Final   Pseudomonas aeruginosa NOT DETECTED NOT DETECTED Final   Stenotrophomonas maltophilia NOT DETECTED NOT DETECTED Final   Candida  albicans NOT DETECTED NOT DETECTED Final   Candida auris NOT DETECTED NOT DETECTED Final   Candida glabrata NOT DETECTED NOT DETECTED Final   Candida krusei NOT DETECTED NOT DETECTED Final   Candida parapsilosis NOT DETECTED NOT DETECTED Final   Candida tropicalis NOT DETECTED NOT DETECTED Final   Cryptococcus neoformans/gattii NOT DETECTED NOT DETECTED Final   Meth resistant mecA/C and MREJ NOT DETECTED NOT DETECTED Final    Comment: Performed at [facility], [address] [place], [place] [address]  Culture, blood (Routine X 2) w Reflex to ID Panel     Status: None   Collection Time: 01/25/23  9:52 AM   Specimen: BLOOD RIGHT ARM  Result Value Ref Range Status   Specimen Description BLOOD RIGHT ARM  Final   Special Requests   Final    BOTTLES DRAWN AEROBIC AND ANAEROBIC Blood Culture results may not be optimal due to an inadequate volume of blood received in culture bottles   Culture   Final    NO GROWTH 5 DAYS Performed at [facility], [address] [place], [place] [address]    Report Status 01/30/2023 FINAL  Final  Culture, blood (Routine X 2) w Reflex to ID  Panel     Status: None   Collection Time: 01/25/23  9:52 AM   Specimen: BLOOD RIGHT HAND  Result Value Ref Range Status   Specimen Description BLOOD RIGHT HAND  Final   Special Requests   Final    BOTTLES DRAWN AEROBIC AND ANAEROBIC Blood Culture results may not be optimal due to an inadequate volume of blood received in culture bottles   Culture   Final    NO GROWTH 5 DAYS Performed at [facility], [address], [place], [place] [address]    Report Status 01/30/2023 FINAL  Final  MRSA Next Gen by PCR, Nasal     Status: None   Collection Time: 01/29/23  7:41 AM   Specimen: Nasal Mucosa; Nasal Swab  Result Value Ref Range Status   MRSA by PCR Next Gen NOT DETECTED NOT DETECTED Final    Comment: (NOTE) The GeneXpert MRSA Assay (FDA approved for NASAL specimens only), is one component of a  comprehensive MRSA colonization surveillance program. It is not intended to diagnose MRSA infection nor to guide or monitor treatment for MRSA infections. Test performance is not FDA approved in patients less than 79 years old. Performed at [facility], [address] [place], [place] [address]     Procedures/Studies: [place] EKG SITE RITE  Result Date: 01/28/2023 If [facility] image not attached, placement could not be confirmed due to current cardiac rhythm.  ECHOCARDIOGRAM COMPLETE  Result Date: 01/26/2023    ECHOCARDIOGRAM REPORT   Patient Name:   Andre Casey. Date of Exam: 01/26/2023 Medical Rec #:  [id number]                Height:       67.0 in Accession #:    [id number]               Weight:       128.3 lb Date of Birth:  Dec 31, 1929                BSA:          1.675 m Patient Age:    89 years                 BP:           110/71 mmHg Patient Gender: M                        HR:           74 bpm. Exam Location:  Inpatient Procedure: 2D Echo, Cardiac Doppler and Color Doppler Indications:    Endocarditis  History:        Patient has prior history of Echocardiogram examinations, most                 recent 05/03/2020. CAD, Arrythmias:LBBB; Risk                 Factors:Hypertension and Dyslipidemia.  Sonographer:    Vern Claude Referring Phys: [id number] [facility]  Sonographer Comments: Technically difficult study due to poor echo windows. Image acquisition challenging due to respiratory motion. IMPRESSIONS  1. Left ventricular ejection fraction, by estimation, is 60 to 65%. The left ventricle has normal function. Left ventricular endocardial border not optimally defined to evaluate regional wall motion. Left ventricular diastolic parameters are consistent with Grade I diastolic dysfunction (impaired relaxation).  2. Right ventricular systolic function is low normal. The right ventricular size  is normal.  3. The mitral valve is grossly normal. No evidence of mitral valve  regurgitation.  4. The aortic valve was not well visualized. Aortic valve regurgitation is not visualized.  5. The inferior vena cava is normal in size with greater than 50% respiratory variability, suggesting right atrial pressure of 3 mmHg. Comparison(s): Changes from prior study are noted. 05/03/2020: LVEF 55-60%. Conclusion(s)/Recommendation(s): No evidence of valvular vegetations on this transthoracic echocardiogram. Consider a transesophageal echocardiogram to exclude infective endocarditis if clinically indicated. Limited evaluation of the valves due to poor echo windows, but no obvious vegetation. FINDINGS  Left Ventricle: Left ventricular ejection fraction, by estimation, is 60 to 65%. The left ventricle has normal function. Left ventricular endocardial border not optimally defined to evaluate regional wall motion. The left ventricular internal cavity size was normal in size. There is no left ventricular hypertrophy. Left ventricular diastolic parameters are consistent with Grade I diastolic dysfunction (impaired relaxation). Indeterminate filling pressures. Right Ventricle: The right ventricular size is normal. No increase in right ventricular wall thickness. Right ventricular systolic function is low normal. Left Atrium: Left atrial size was normal in size. Right Atrium: Right atrial size was normal in size. Pericardium: There is no evidence of pericardial effusion. Mitral Valve: The mitral valve is grossly normal. No evidence of mitral valve regurgitation. MV peak gradient, 3.2 mmHg. The mean mitral valve gradient is 1.0 mmHg. Tricuspid Valve: The tricuspid valve is grossly normal. Tricuspid valve regurgitation is not demonstrated. Aortic Valve: The aortic valve was not well visualized. Aortic valve regurgitation is not visualized. Aortic valve mean gradient measures 3.0 mmHg. Aortic valve peak gradient measures 5.2 mmHg. Aortic valve area, by VTI measures 1.67 cm. Pulmonic Valve: The pulmonic valve was  not well visualized. Pulmonic valve regurgitation is not visualized. Aorta: The aortic root and ascending aorta are structurally normal, with no evidence of dilitation. Venous: The inferior vena cava is normal in size with greater than 50% respiratory variability, suggesting right atrial pressure of 3 mmHg. IAS/Shunts: The interatrial septum was not well visualized.  LEFT VENTRICLE PLAX 2D LVIDd:         3.90 cm     Diastology LVIDs:         2.90 cm     LV e' medial:    7.15 cm/s LV PW:         0.70 cm     LV E/e' medial:  10.0 LV IVS:        0.90 cm     LV e' lateral:   7.30 cm/s LVOT diam:     1.90 cm     LV E/e' lateral: 9.8 LV SV:         34 LV SV Index:   20 LVOT Area:     2.84 cm  LV Volumes (MOD) LV vol d, MOD A2C: 63.3 ml LV vol d, MOD A4C: 63.3 ml LV vol s, MOD A2C: 17.9 ml LV vol s, MOD A4C: 27.7 ml LV SV MOD A2C:     45.4 ml LV SV MOD A4C:     63.3 ml LV SV MOD BP:      47.9 ml RIGHT VENTRICLE             IVC RV S prime:     10.90 cm/s  IVC diam: 1.80 cm TAPSE (M-mode): 2.0 cm LEFT ATRIUM           Index        RIGHT ATRIUM  Index LA diam:      3.10 cm 1.85 cm/m   RA Area:     11.90 cm LA Vol (A2C): 41.9 ml 25.02 ml/m  RA Volume:   24.80 ml  14.81 ml/m LA Vol (A4C): 12.5 ml 7.46 ml/m  AORTIC VALVE                    PULMONIC VALVE AV Area (Vmax):    1.78 cm     PV Vmax:       0.62 m/s AV Area (Vmean):   2.05 cm     PV Peak grad:  1.5 mmHg AV Area (VTI):     1.67 cm AV Vmax:           114.00 cm/s AV Vmean:          73.500 cm/s AV VTI:            0.202 m AV Peak Grad:      5.2 mmHg AV Mean Grad:      3.0 mmHg LVOT Vmax:         71.40 cm/s LVOT Vmean:        53.100 cm/s LVOT VTI:          0.119 m LVOT/AV VTI ratio: 0.59  AORTA Ao Root diam: 3.20 cm Ao Asc diam:  2.90 cm MITRAL VALVE MV Area (PHT): 3.10 cm    SHUNTS MV Area VTI:   1.98 cm    Systemic VTI:  0.12 m MV Peak grad:  3.2 mmHg    Systemic Diam: 1.90 cm MV Mean grad:  1.0 mmHg MV Vmax:       0.90 m/s MV Vmean:      53.5 cm/s MV  Decel Time: 245 msec MV E velocity: 71.60 cm/s MV A velocity: 76.40 cm/s MV E/A ratio:  0.94 Zoila Shutter MD Electronically signed by Zoila Shutter MD Signature Date/Time: 01/26/2023/10:35:05 AM    Final    CT CHEST ABDOMEN PELVIS WO CONTRAST  Result Date: 01/26/2023 CLINICAL DATA:  Systemic infection. EXAM: CT CHEST, ABDOMEN AND PELVIS WITHOUT CONTRAST TECHNIQUE: Multidetector CT imaging of the chest, abdomen and pelvis was performed following the standard protocol without IV contrast. RADIATION DOSE REDUCTION: This exam was performed according to the departmental dose-optimization program which includes automated exposure control, adjustment of the mA and/or kV according to patient size and/or use of iterative reconstruction technique. COMPARISON:  CTA chest 01/14/2019, CT abdomen and pelvis without contrast 07/30/2021, CT abdomen pelvis with contrast 07/03/2021. FINDINGS: CT CHEST FINDINGS Cardiovascular: Normal caliber the pulmonary arteries and veins. Normal cardiac size with small chronic anterior pericardial effusion. Single-vessel calcifications and prior stenting LAD coronary artery. Aortic atherosclerosis and tortuosity without aneurysm, with normal great vessel branching. Mediastinum/Nodes: No enlarged mediastinal, hilar, or axillary lymph nodes. Thyroid gland, trachea, and esophagus demonstrate no significant findings. Both main bronchi are clear. Lungs/Pleura: Paraseptal and centrilobular emphysematous changes. Minimal bilateral layering pleural effusions slightly greater fluid on the left. Mild bronchial thickening noted left upper and both lower lobes. Calcified granuloma left apex. Mild perifissural atelectasis is seen in both upper lobes but no active infiltrates. Mild chronic elevation right hemidiaphragm. The lungs are otherwise clear. Musculoskeletal: Spray artifact from old right shoulder arthroplasty. Chronic resection distal left clavicle and left acromiohumeral abutment consistent with  chronic rotator cuff arthropathy. Osteopenia and degenerative change thoracic spine. No acute or other significant osseous findings or chest wall mass. CT ABDOMEN PELVIS FINDINGS Hepatobiliary: The liver is unremarkable without  contrast. There is mild gallbladder dilatation without wall thickening, calcified stones or biliary dilatation. Pancreas: Hypodense pancreatic head lesion (Hounsfield density of 28) again noted anteriorly, measuring 2.9 x 2.5 cm, previously 2.4 x 2 cm on the last CT and has been described and reported previously. Again this abuts the anterior and right lateral wall of the SMV. Per report of an MRI from 02/16/2019 this has been slowly growing since 2007, and was thought to be a cystic lesion by MRI appearance compatible with a pseudocyst or side branch IPMN. There is no upstream pancreatic ductal dilatation, no inflammatory changes and no other masslike contour deformity. Spleen: No abnormality. Adrenals/Urinary Tract: There is no adrenal mass. Right renal mass slightly hyperdense to cortex again is noted in the interpolar area measuring 2.9 x 1.9 cm on 3:73, on the last CT similar in size, and on previous imaging noted consistent with a renal cell carcinoma. There is no new contour deforming abnormality of either kidney. On the left, there is mild-to-moderate new left hydroureteronephrosis. Perinephric edema. Correlate clinically for infectious complication. There are no intrarenal stones currently or previously. Etiology of the findings of obstructive uropathy is not identified as the most distal left ureter and the UVJ are obscured by numerous pelvic surgical clips. Any number of obstructive etiologies could be present, from interval new stone disease to a distal ureteral stricture or mass. The bladder is contracted around a suprapubic catheter balloon is not well evaluated, also mostly obscured by spray artifact from the surgical clips. Suprapubic catheter was also in place on both prior  studies from last year. Stomach/Bowel: No dilatation, wall thickening or inflammatory change. This includes the appendix. Small hiatal hernia. Colonic diverticulosis noted heaviest in the sigmoid segment without acute diverticulitis. Vascular/Lymphatic: Aortic atherosclerosis. No enlarged abdominal or pelvic lymph nodes. There previously was a mildly prominent left inguinal chain nodes which is not seen today. There previously was a slightly prominent retroaortic lymph node which is also no longer seen. Reproductive: Radical prostatectomy. Allowing for spray artifact from surgical clips no recurrent mass is seen in the prostate bed. Surgical clips continue up the pelvic sidewalls consistent with lymph node dissection. Other: Small volume of posterior deep pelvic ascites. Not seen previously. There is no free hemorrhage, free air or incarcerated hernia, no focal inflammatory process. Musculoskeletal: Osteopenia and advanced degenerative change lumbar spine with mild levoscoliosis. L4 previously demonstrated a densely sclerotic vertebral body lesion, where currently there is trabecular coarsening and scattered cystic spaces in the vertebral body. 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 No other focal bone lesion is seen. IMPRESSION: 1. New mild-to-moderate left hydroureteronephrosis with perinephric edema. Correlate clinically for infectious complication. Etiology of the obstructive uropathy is not identified as the most distal left ureter and the UVJ are obscured by numerous pelvic surgical clips. Any number of obstructive etiologies could be present, from interval new stone disease to a distal ureteral stricture or mass. 2. No intrarenal stones currently or previously. 3. 2.9 x 1.9 cm right renal mass consistent with a renal cell carcinoma, similar in size to the last CT. 4. Radical prostatectomy with no evidence of recurrent mass in the prostate bed. 5. Small volume of posterior deep pelvic ascites, new. 6.  Minimal bilateral pleural effusions. 7. Emphysema. 8. 2.9 x 2.5 cm hypodense pancreatic head lesion, previously 2.4 x 2 cm on the last CT and has been described and reported previously. Per report of an MRI from 02/16/2019 this has been slowly growing since 2007, and  was thought to be a cystic lesion by MRI appearance compatible with a pseudocyst or side branch IPMN. 9. Aortic and coronary artery atherosclerosis. 10. L4 previously demonstrated a densely sclerotic vertebral body lesion, where currently there is trabecular coarsening and scattered cystic spaces in the vertebral body. 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 11. Mild gallbladder dilatation without wall thickening, calcified stones or biliary dilatation. 12. Small hiatal hernia. 13. Colonic diverticulosis without evidence of acute diverticulitis. Aortic Atherosclerosis (ICD10-I70.0) and Emphysema (ICD10-J43.9). Electronically Signed   By: Almira Bar M.D.   On: 01/26/2023 00:18   CT MAXILLOFACIAL WO CONTRAST  Result Date: 01/25/2023 CLINICAL DATA:  Complicated UTI, recent falls. EXAM: CT MAXILLOFACIAL WITHOUT CONTRAST TECHNIQUE: Multidetector CT imaging of the maxillofacial structures was performed. Multiplanar CT image reconstructions were also generated. RADIATION DOSE REDUCTION: This exam was performed according to the departmental dose-optimization program which includes automated exposure control, adjustment of the mA and/or kV according to patient size and/or use of iterative reconstruction technique. COMPARISON:  CT neck 04/15/2021, MRI head 07/21/2015, CT head 01/22/2022 FINDINGS: Osseous: No fracture or mandibular dislocation. No destructive process. Bilateral temporomandibular joint degenerative changes. Orbits: Similar-appearing bilateral enophthalmos. No traumatic or inflammatory finding. Sinuses: Clear. Soft tissues: Negative. Limited intracranial: No significant or unexpected finding. Other: Degenerative changes of the spine with  grade 1 anterolisthesis of C3 on C4 and C4 on C5. Mild retrolisthesis of C5 on C6 and C6 on C7. IMPRESSION: No acute displaced facial fracture. Electronically Signed   By: Tish Frederickson M.D.   On: 01/25/2023 23:36   DG Shoulder Right  Result Date: 01/25/2023 CLINICAL DATA:  Generalized weakness and right shoulder pain for a few days. EXAM: RIGHT SHOULDER - 3 VIEW COMPARISON:  X-ray 04/20/2015. FINDINGS: Osteopenia. Reverse shoulder arthroplasty again identified. Stable alignment. No hardware failure. Press-Fit humeral component and screw fixated glenoid component. Hypertrophic degenerative changes of the [facility] joint. Global osteopenia. No fracture or dislocation. No evidence of hardware failure. IMPRESSION: Reverse shoulder arthroplasty.  Degenerative changes and osteopenia. Electronically Signed   By: Karen Kays M.D.   On: 01/25/2023 18:07   CT Head Wo Contrast  Result Date: 01/23/2023 CLINICAL DATA:  Minor head trauma. EXAM: CT HEAD WITHOUT CONTRAST TECHNIQUE: Contiguous axial images were obtained from the base of the skull through the vertex without intravenous contrast. RADIATION DOSE REDUCTION: This exam was performed according to the departmental dose-optimization program which includes automated exposure control, adjustment of the mA and/or kV according to patient size and/or use of iterative reconstruction technique. COMPARISON:  MRI brain 07/21/2015 FINDINGS: Brain: Diffuse cerebral atrophy. Ventricular dilatation consistent with central atrophy. Low-attenuation changes in the deep white matter consistent with small vessel ischemia. No abnormal extra-axial fluid collections. No mass effect or midline shift. Gray-white matter junctions are distinct. Basal cisterns are not effaced. No acute intracranial hemorrhage. Vascular: No hyperdense vessel or unexpected calcification. Skull: Normal. Negative for fracture or focal lesion. Sinuses/Orbits: No acute finding. Other: None. IMPRESSION: No acute  intracranial abnormalities. Chronic atrophy and small vessel ischemic changes. Electronically Signed   By: Burman Nieves M.D.   On: 01/23/2023 21:05   DG Chest Portable 1 View  Result Date: 01/23/2023 CLINICAL DATA:  Fall this week with subsequent weakness. EXAM: PORTABLE CHEST 1 VIEW COMPARISON:  05/01/2022 FINDINGS: Slightly shallow inspiration. Linear atelectasis or infiltration in the left base. No pleural effusion. No pneumothorax. Heart size and pulmonary vascularity are normal. Mediastinal contours appear intact. Calcified and tortuous aorta. Degenerative changes in the spine  and left shoulder. Postoperative change in the right shoulder. Previous resection or resorption of the left distal clavicle. Cortical irregularities in the anterolateral left seventh and eighth ribs probably represent nondisplaced fractures. IMPRESSION: Infiltration or atelectasis demonstrated in the left base. Probable nondisplaced left rib fractures. Electronically Signed   By: Burman Nieves M.D.   On: 01/23/2023 19:42    Labs: BNP (last 3 results) No results for input(s): "BNP" in the last 8760 hours. Basic Metabolic Panel: Recent Labs  Lab 01/24/23 0553 01/25/23 0837 01/26/23 0428 01/28/23 0721 01/30/23 0242  NA 132* 135 135 137 133*  K 3.2* 3.2* 3.6 3.5 3.8  CL 102 103 109 105 100  CO2 18* 17* 19* 23 22  GLUCOSE 107* 103* 156* 117* 109*  BUN 38* 28* 26* 29* 23  CREATININE 2.29* 1.83* 1.36* 1.33* 1.24  CALCIUM 7.9* 8.2* 8.1* 8.3* 8.0*  MG 2.1  --   --   --   --   PHOS 1.6*  --   --   --   --    Liver Function Tests: Recent Labs  Lab 01/23/23 1734  AST 37  ALT 18  ALKPHOS 74  BILITOT 1.0  PROT 5.7*  ALBUMIN 2.2*   Recent Labs  Lab 01/23/23 1734  LIPASE 21   No results for input(s): "AMMONIA" in the last 168 hours. CBC: Recent Labs  Lab 01/23/23 1734 01/24/23 0553 01/26/23 0428 01/28/23 0721 01/30/23 0242  WBC 13.8* 13.8* 13.5* 11.6* 12.7*  HGB 10.3* 9.7* 10.4* 11.7* 10.1*   HCT 31.6* 29.3* 32.7* 35.9* 32.6*  MCV 88.5 85.4 87.4 88.0 89.6  PLT 285 250 318 617* 675*   Cardiac Enzymes: No results for input(s): "CKTOTAL", "CKMB", "CKMBINDEX", "TROPONINI" in the last 168 hours. BNP: Invalid input(s): "POCBNP" CBG: No results for input(s): "GLUCAP" in the last 168 hours. D-Dimer No results for input(s): "DDIMER" in the last 72 hours. Hgb A1c No results for input(s): "HGBA1C" in the last 72 hours. Lipid Profile No results for input(s): "CHOL", "HDL", "LDLCALC", "TRIG", "CHOLHDL", "LDLDIRECT" in the last 72 hours. Thyroid function studies No results for input(s): "TSH", "T4TOTAL", "T3FREE", "THYROIDAB" in the last 72 hours.  Invalid input(s): "FREET3" Anemia work up No results for input(s): "VITAMINB12", "FOLATE", "FERRITIN", "TIBC", "IRON", "RETICCTPCT" in the last 72 hours. Urinalysis    Component Value Date/Time   COLORURINE YELLOW 01/23/2023 1932   APPEARANCEUR TURBID (A) 01/23/2023 1932   LABSPEC 1.013 01/23/2023 1932   PHURINE 6.0 01/23/2023 1932   GLUCOSEU NEGATIVE 01/23/2023 1932   HGBUR MODERATE (A) 01/23/2023 1932   BILIRUBINUR NEGATIVE 01/23/2023 1932   KETONESUR 5 (A) 01/23/2023 1932   PROTEINUR >=300 (A) 01/23/2023 1932   UROBILINOGEN 0.2 05/15/2007 1118   NITRITE NEGATIVE 01/23/2023 1932   LEUKOCYTESUR MODERATE (A) 01/23/2023 1932   Sepsis Labs Recent Labs  Lab 01/24/23 0553 01/26/23 0428 01/28/23 0721 01/30/23 0242  WBC 13.8* 13.5* 11.6* 12.7*   Microbiology Recent Results (from the past 240 hour(s))  Urine Culture     Status: Abnormal   Collection Time: 01/23/23  7:32 PM   Specimen: Urine, Suprapubic  Result Value Ref Range Status   Specimen Description URINE, SUPRAPUBIC  Final   Special Requests NONE  Final   Culture (A)  Final    20,000 COLONIES/mL KLEBSIELLA PNEUMONIAE >=100,000 COLONIES/mL STAPHYLOCOCCUS AUREUS >=100,000 COLONIES/mL AEROCOCCUS SPECIES Standardized susceptibility testing for this organism is not  available. Performed at [facility], [address], [place], [place] [address]  Report Status 01/26/2023 FINAL  Final   Organism ID, Bacteria KLEBSIELLA PNEUMONIAE (A)  Final   Organism ID, Bacteria STAPHYLOCOCCUS AUREUS (A)  Final      Susceptibility   Klebsiella pneumoniae - MIC*    AMPICILLIN RESISTANT Resistant     CEFAZOLIN <=4 SENSITIVE Sensitive     CEFEPIME <=0.12 SENSITIVE Sensitive     CEFTRIAXONE <=0.25 SENSITIVE Sensitive     CIPROFLOXACIN <=0.25 SENSITIVE Sensitive     GENTAMICIN <=1 SENSITIVE Sensitive     IMIPENEM <=0.25 SENSITIVE Sensitive     NITROFURANTOIN 64 INTERMEDIATE Intermediate     TRIMETH/SULFA <=20 SENSITIVE Sensitive     AMPICILLIN/SULBACTAM 4 SENSITIVE Sensitive     PIP/TAZO <=4 SENSITIVE Sensitive ug/mL    * 20,000 COLONIES/mL KLEBSIELLA PNEUMONIAE   Staphylococcus aureus - MIC*    CIPROFLOXACIN <=0.5 SENSITIVE Sensitive     GENTAMICIN <=0.5 SENSITIVE Sensitive     NITROFURANTOIN 32 SENSITIVE Sensitive     OXACILLIN 0.5 SENSITIVE Sensitive     TETRACYCLINE <=1 SENSITIVE Sensitive     VANCOMYCIN 1 SENSITIVE Sensitive     TRIMETH/SULFA <=10 SENSITIVE Sensitive     CLINDAMYCIN <=0.25 SENSITIVE Sensitive     RIFAMPIN <=0.5 SENSITIVE Sensitive     Inducible Clindamycin NEGATIVE Sensitive     LINEZOLID 2 SENSITIVE Sensitive     * >=100,000 COLONIES/mL STAPHYLOCOCCUS AUREUS  Culture, blood (routine x 2)     Status: Abnormal   Collection Time: 01/23/23  9:17 PM   Specimen: BLOOD  Result Value Ref Range Status   Specimen Description BLOOD RIGHT ANTECUBITAL  Final   Special Requests   Final    BOTTLES DRAWN AEROBIC AND ANAEROBIC Blood Culture adequate volume   Culture  Setup Time   Final    GRAM POSITIVE COCCI IN BOTH AEROBIC AND ANAEROBIC BOTTLES CRITICAL RESULT CALLED TO, READ BACK BY AND VERIFIED WITH: PHARMD JESSICA MILLEN [id number] BY Berline Chough, [place] Performed at [facility], [address], [place], [place] [address]     Culture STAPHYLOCOCCUS AUREUS (A)  Final   Report Status 01/26/2023 FINAL  Final   Organism ID, Bacteria STAPHYLOCOCCUS AUREUS  Final      Susceptibility   Staphylococcus aureus - MIC*    CIPROFLOXACIN <=0.5 SENSITIVE Sensitive     ERYTHROMYCIN <=0.25 SENSITIVE Sensitive     GENTAMICIN <=0.5 SENSITIVE Sensitive     OXACILLIN <=0.25 SENSITIVE Sensitive     TETRACYCLINE <=1 SENSITIVE Sensitive     VANCOMYCIN 1 SENSITIVE Sensitive     TRIMETH/SULFA <=10 SENSITIVE Sensitive     CLINDAMYCIN <=0.25 SENSITIVE Sensitive     RIFAMPIN <=0.5 SENSITIVE Sensitive     Inducible Clindamycin NEGATIVE Sensitive     LINEZOLID 2 SENSITIVE Sensitive     * STAPHYLOCOCCUS AUREUS  Culture, blood (routine x 2)     Status: Abnormal   Collection Time: 01/23/23  9:17 PM   Specimen: BLOOD LEFT HAND  Result Value Ref Range Status   Specimen Description BLOOD LEFT HAND  Final   Special Requests   Final    BOTTLES DRAWN AEROBIC AND ANAEROBIC Blood Culture adequate volume   Culture  Setup Time   Final    GRAM POSITIVE COCCI IN BOTH AEROBIC AND ANAEROBIC BOTTLES    Culture (A)  Final    STAPHYLOCOCCUS AUREUS SUSCEPTIBILITIES PERFORMED ON PREVIOUS CULTURE WITHIN THE LAST 5 DAYS. Performed at [facility], [address] [place], [place] [address]    Report Status  01/26/2023 FINAL  Final  Blood Culture ID Panel (Reflexed)     Status: Abnormal   Collection Time: 01/23/23  9:17 PM  Result Value Ref Range Status   Enterococcus faecalis NOT DETECTED NOT DETECTED Final   Enterococcus Faecium NOT DETECTED NOT DETECTED Final   Listeria monocytogenes NOT DETECTED NOT DETECTED Final   Staphylococcus species DETECTED (A) NOT DETECTED Final    Comment: CRITICAL RESULT CALLED TO, READ BACK BY AND VERIFIED WITH: PHARMD JESSICA MILLEN [id number] BY J RAZZAK, [place]    Staphylococcus aureus (BCID) DETECTED (A) NOT DETECTED Final    Comment: CRITICAL RESULT CALLED TO, READ BACK BY AND VERIFIED WITH: PHARMD JESSICA  MILLEN [id number] BY J RAZZAK, MT    Staphylococcus epidermidis NOT DETECTED NOT DETECTED Final   Staphylococcus lugdunensis NOT DETECTED NOT DETECTED Final   Streptococcus species NOT DETECTED NOT DETECTED Final   Streptococcus agalactiae NOT DETECTED NOT DETECTED Final   Streptococcus pneumoniae NOT DETECTED NOT DETECTED Final   Streptococcus pyogenes NOT DETECTED NOT DETECTED Final   A.calcoaceticus-baumannii NOT DETECTED NOT DETECTED Final   Bacteroides fragilis NOT DETECTED NOT DETECTED Final   Enterobacterales NOT DETECTED NOT DETECTED Final   Enterobacter cloacae complex NOT DETECTED NOT DETECTED Final   Escherichia coli NOT DETECTED NOT DETECTED Final   Klebsiella aerogenes NOT DETECTED NOT DETECTED Final   Klebsiella oxytoca NOT DETECTED NOT DETECTED Final   Klebsiella pneumoniae NOT DETECTED NOT DETECTED Final   Proteus species NOT DETECTED NOT DETECTED Final   Salmonella species NOT DETECTED NOT DETECTED Final   Serratia marcescens NOT DETECTED NOT DETECTED Final   Haemophilus influenzae NOT DETECTED NOT DETECTED Final   Neisseria meningitidis NOT DETECTED NOT DETECTED Final   Pseudomonas aeruginosa NOT DETECTED NOT DETECTED Final   Stenotrophomonas maltophilia NOT DETECTED NOT DETECTED Final   Candida albicans NOT DETECTED NOT DETECTED Final   Candida auris NOT DETECTED NOT DETECTED Final   Candida glabrata NOT DETECTED NOT DETECTED Final   Candida krusei NOT DETECTED NOT DETECTED Final   Candida parapsilosis NOT DETECTED NOT DETECTED Final   Candida tropicalis NOT DETECTED NOT DETECTED Final   Cryptococcus neoformans/gattii NOT DETECTED NOT DETECTED Final   Meth resistant mecA/C and MREJ NOT DETECTED NOT DETECTED Final    Comment: Performed at [facility], [address] [place], [place] [address]  Culture, blood (Routine X 2) w Reflex to ID Panel     Status: None   Collection Time: 01/25/23  9:52 AM   Specimen: BLOOD RIGHT ARM  Result Value Ref Range Status    Specimen Description BLOOD RIGHT ARM  Final   Special Requests   Final    BOTTLES DRAWN AEROBIC AND ANAEROBIC Blood Culture results may not be optimal due to an inadequate volume of blood received in culture bottles   Culture   Final    NO GROWTH 5 DAYS Performed at [facility], [address], [place], [place] [address]    Report Status 01/30/2023 FINAL  Final  Culture, blood (Routine X 2) w Reflex to ID Panel     Status: None   Collection Time: 01/25/23  9:52 AM   Specimen: BLOOD RIGHT HAND  Result Value Ref Range Status   Specimen Description BLOOD RIGHT HAND  Final   Special Requests   Final    BOTTLES DRAWN AEROBIC AND ANAEROBIC Blood Culture results may not be optimal due to an inadequate volume of blood received in culture bottles  Culture   Final    NO GROWTH 5 DAYS Performed at [facility], [address] [place], [place] [address]    Report Status 01/30/2023 FINAL  Final  MRSA Next Gen by PCR, Nasal     Status: None   Collection Time: 01/29/23  7:41 AM   Specimen: Nasal Mucosa; Nasal Swab  Result Value Ref Range Status   MRSA by PCR Next Gen NOT DETECTED NOT DETECTED Final    Comment: (NOTE) The GeneXpert MRSA Assay (FDA approved for NASAL specimens only), is one component of a comprehensive MRSA colonization surveillance program. It is not intended to diagnose MRSA infection nor to guide or monitor treatment for MRSA infections. Test performance is not FDA approved in patients less than 5 years old. Performed at [facility], [address] [place], [place] [address]      Time coordinating discharge: 35  minutes  SIGNED: Lanae Boast, MD  [facility] 01/30/2023, 8:58 AM  If 7PM-7AM, please contact night-coverage [email or web address]

## 2023-01-29 NOTE — Progress Notes (Signed)
PROGRESS NOTE Andre Casey.  ZOX:[id number] DOB: 1929-04-23 DOA: 01/23/2023 PCP: Lupita Raider, MD  Brief Narrative/Hospital Course: 89 year old male who presents from home (where he lives alone) with prostate cancer, chronic suprapubic catheter, HTN, history of ESBL UTI who comes in from home due to generalized weakness and increased frequency of falls. He was septic in the ED with fever of 101.3, tachycardic, and urinalysis positive for pyuria. He was started on meropenem and admitted to the hospital.  Patient admitted and treated for sepsis blood culture with MSSA bacteremia, also UTI, suprapubic catheter in place likely the source of UTI, TTE without particular concern repeat cultures remain negative, seen by ID did not recommend TEE advised prolonged IV antibiotics course x 4 wk. seen by orthopedics no evidence of shoulder infection  and attempt at aspiration unsuccessful  on 12/2.     Subjective: Seen examined resting comfortably no new complaints awaiting for placement   Assessment and Plan: Principal Problem:   Generalized weakness   MSSA Sepsis/bacteremia UTI POA due to SP catheter-2/2 ESBL Klebsiella: Prosthetic right shoulder-no concern for inspection, tap was dry 12/2: Blood culture positive on 11/29, repeat blood culture 12/1 NGTD.  Seen by ID, orthopedics. TTE without particular concern repeat cultures remain negative, seen by ID did not recommend TEE advised prolonged IV antibiotics course x 4 wk. S/P PICC line 12/4 -continue Cefazolin 2 g IV q12 hr x 4 weeks (end date 02/21/2023) CT chest abdomen pelvis and face without overt or clear infectious source   Renal mass, unspecified  Initially noted at [facility] in October, appears stable on repeat imaging here Plan at that time was to follow-up with outpatient urology and oncology   Generalized weakness secondary to above Moderate to severe protein caloric malnutrition Continue PT OT fall precaution augment diet,  plan for SNF   AKI: Prerenal with poor intake, creatinine improved. Recent Labs (within last 365 days)         Recent Labs    01/23/23 1734 01/24/23 0553 01/25/23 0837 01/26/23 0428 01/28/23 0721  BUN 37* 38* 28* 26* 29*  CREATININE 2.54* 2.29* 1.83* 1.36* 1.33*  CO2 21* 18* 17* 19* 23  K 2.8* 3.2* 3.2* 3.6 3.5      Hypokalemia Resolved   Mild hypovolemic hyponatremia Mild non-anion gap metabolic acidosis: Resolved.   QTc prolongation EKG QTc 524 Avoid QTc prolonging agents   Elevated troponin, not clinically relevant  DVT prophylaxis: enoxaparin (LOVENOX) injection 30 mg Start: 01/23/23 2130 Code Status:   Code Status: Limited: Do not attempt resuscitation (DNR) -DNR-LIMITED -Do Not Intubate/DNI  Family Communication: plan of care discussed with patient at bedside. Patient status is: Inpatient because of awaiting placement and sepsis Level of care: Telemetry Medical   Dispo: The patient is from: home            Anticipated disposition: SNF pending. Awaiting auth.  Objective: Vitals last 24 hrs: Vitals:   01/28/23 2036 01/29/23 0500 01/29/23 0552 01/29/23 0747  BP: 137/83  128/81 (!) 151/73  Pulse: 96  65 81  Resp: 18  18 16   Temp: 99.5 F (37.5 C)  99.3 F (37.4 C) 97.8 F (36.6 C)  TempSrc: Oral  Oral   SpO2: 96%  96% 95%  Weight:  53 kg    Height:       Weight change: -2.9 kg  Physical Examination:  General exam: alert awake, older than stated age HEENT:Oral mucosa moist, Ear/Nose WNL grossly Respiratory system: bilaterally clear BS, no use  of accessory muscle Cardiovascular system: S1 & S2 +, No JVD. Gastrointestinal system: Abdomen soft,NT,ND, BS+ Nervous System:Alert, awake, moving extremities. Extremities: LE edema neg,distal peripheral pulses palpable.  Skin: No rashes,no icterus. MSK: Normal muscle bulk,tone, power  Medications reviewed:  Scheduled Meds:  bupivacaine(PF)  10 mL Infiltration Once   Chlorhexidine Gluconate Cloth  6 each  Topical Daily   diclofenac Sodium  4 g Topical QID   enoxaparin (LOVENOX) injection  30 mg Subcutaneous Q24H   feeding supplement  237 mL Oral BID BM   sodium chloride flush  10-40 mL Intracatheter Q12H   Continuous Infusions:   ceFAZolin (ANCEF) IV 2 g (01/29/23 1010)    Diet Order             Diet Heart Fluid consistency: Thin  Diet effective now                  Intake/Output Summary (Last 24 hours) at 01/29/2023 1258 Last data filed at 01/29/2023 0500 Gross per 24 hour  Intake 10 ml  Output 1200 ml  Net -1190 ml   Net IO Since Admission: -2,954.96 mL [01/29/23 1258]  Wt Readings from Last 3 Encounters:  01/29/23 53 kg  04/25/22 58.3 kg  12/12/21 59.9 kg     Unresulted Labs (From admission, onward)     Start     Ordered   01/30/23 0500  Creatinine, serum  (enoxaparin (LOVENOX)    CrCl < 30 ml/min)  Once,   R       Comments: while on enoxaparin therapy.    01/23/23 2129   01/26/23 1126  Synovial cell count + diff, w/ crystals  Once,   R        01/26/23 1125   01/26/23 1125  Body fluid culture w Gram Stain  Once,   R       Question:  Are there also cytology or pathology orders on this specimen?  Answer:  Yes   01/26/23 1125   01/26/23 0500  CBC  Every 48 hours,   R      01/25/23 1248   01/26/23 0500  Basic metabolic panel  Every 48 hours,   R      01/25/23 1248          Data Reviewed: I have personally reviewed following labs and imaging studies CBC: Recent Labs  Lab 01/23/23 1734 01/24/23 0553 01/26/23 0428 01/28/23 0721  WBC 13.8* 13.8* 13.5* 11.6*  HGB 10.3* 9.7* 10.4* 11.7*  HCT 31.6* 29.3* 32.7* 35.9*  MCV 88.5 85.4 87.4 88.0  PLT 285 250 318 617*   Basic Metabolic Panel:  Recent Labs  Lab 01/23/23 1734 01/24/23 0553 01/25/23 0837 01/26/23 0428 01/28/23 0721  NA 132* 132* 135 135 137  K 2.8* 3.2* 3.2* 3.6 3.5  CL 99 102 103 109 105  CO2 21* 18* 17* 19* 23  GLUCOSE 124* 107* 103* 156* 117*  BUN 37* 38* 28* 26* 29*  CREATININE 2.54*  2.29* 1.83* 1.36* 1.33*  CALCIUM 8.0* 7.9* 8.2* 8.1* 8.3*  MG  --  2.1  --   --   --   PHOS  --  1.6*  --   --   --    GFR: Estimated Creatinine Clearance: 27.1 mL/min (A) (by C-G formula based on SCr of 1.33 mg/dL (H)). Liver Function Tests:  Recent Labs  Lab 01/23/23 1734  AST 37  ALT 18  ALKPHOS 74  BILITOT 1.0  PROT 5.7*  ALBUMIN 2.2*  Recent Labs  Lab 01/23/23 1734  LIPASE 21   No results for input(s): "AMMONIA" in the last 168 hours. Coagulation Profile: No results for input(s): "INR", "PROTIME" in the last 168 hours. No results for input(s): "PROBNP" in the last 168 hours.  No results for input(s): "HGBA1C" in the last 72 hours. No results for input(s): "GLUCAP" in the last 168 hours. No results for input(s): "CHOL", "HDL", "LDLCALC", "TRIG", "CHOLHDL", "LDLDIRECT" in the last 72 hours. No results for input(s): "TSH", "T4TOTAL", "FREET4", "T3FREE", "THYROIDAB" in the last 72 hours. Sepsis Labs: Recent Labs  Lab 01/23/23 2021  LATICACIDVEN 1.0    Recent Results (from the past 240 hour(s))  Urine Culture     Status: Abnormal   Collection Time: 01/23/23  7:32 PM   Specimen: Urine, Suprapubic  Result Value Ref Range Status   Specimen Description URINE, SUPRAPUBIC  Final   Special Requests NONE  Final   Culture (A)  Final    20,000 COLONIES/mL KLEBSIELLA PNEUMONIAE >=100,000 COLONIES/mL STAPHYLOCOCCUS AUREUS >=100,000 COLONIES/mL AEROCOCCUS SPECIES Standardized susceptibility testing for this organism is not available. Performed at [facility], [address] [place], [place] [address]    Report Status 01/26/2023 FINAL  Final   Organism ID, Bacteria KLEBSIELLA PNEUMONIAE (A)  Final   Organism ID, Bacteria STAPHYLOCOCCUS AUREUS (A)  Final      Susceptibility   Klebsiella pneumoniae - MIC*    AMPICILLIN RESISTANT Resistant     CEFAZOLIN <=4 SENSITIVE Sensitive     CEFEPIME <=0.12 SENSITIVE Sensitive     CEFTRIAXONE <=0.25 SENSITIVE Sensitive      CIPROFLOXACIN <=0.25 SENSITIVE Sensitive     GENTAMICIN <=1 SENSITIVE Sensitive     IMIPENEM <=0.25 SENSITIVE Sensitive     NITROFURANTOIN 64 INTERMEDIATE Intermediate     TRIMETH/SULFA <=20 SENSITIVE Sensitive     AMPICILLIN/SULBACTAM 4 SENSITIVE Sensitive     PIP/TAZO <=4 SENSITIVE Sensitive ug/mL    * 20,000 COLONIES/mL KLEBSIELLA PNEUMONIAE   Staphylococcus aureus - MIC*    CIPROFLOXACIN <=0.5 SENSITIVE Sensitive     GENTAMICIN <=0.5 SENSITIVE Sensitive     NITROFURANTOIN 32 SENSITIVE Sensitive     OXACILLIN 0.5 SENSITIVE Sensitive     TETRACYCLINE <=1 SENSITIVE Sensitive     VANCOMYCIN 1 SENSITIVE Sensitive     TRIMETH/SULFA <=10 SENSITIVE Sensitive     CLINDAMYCIN <=0.25 SENSITIVE Sensitive     RIFAMPIN <=0.5 SENSITIVE Sensitive     Inducible Clindamycin NEGATIVE Sensitive     LINEZOLID 2 SENSITIVE Sensitive     * >=100,000 COLONIES/mL STAPHYLOCOCCUS AUREUS  Culture, blood (routine x 2)     Status: Abnormal   Collection Time: 01/23/23  9:17 PM   Specimen: BLOOD  Result Value Ref Range Status   Specimen Description BLOOD RIGHT ANTECUBITAL  Final   Special Requests   Final    BOTTLES DRAWN AEROBIC AND ANAEROBIC Blood Culture adequate volume   Culture  Setup Time   Final    GRAM POSITIVE COCCI IN BOTH AEROBIC AND ANAEROBIC BOTTLES CRITICAL RESULT CALLED TO, READ BACK BY AND VERIFIED WITH: PHARMD JESSICA MILLEN [phone] [id number] BY Berline Chough, [place] Performed at [facility], [address], [place], [place] [address]    Culture STAPHYLOCOCCUS AUREUS (A)  Final   Report Status 01/26/2023 FINAL  Final   Organism ID, Bacteria STAPHYLOCOCCUS AUREUS  Final      Susceptibility   Staphylococcus aureus - MIC*    CIPROFLOXACIN <=0.5 SENSITIVE Sensitive  ERYTHROMYCIN <=0.25 SENSITIVE Sensitive     GENTAMICIN <=0.5 SENSITIVE Sensitive     OXACILLIN <=0.25 SENSITIVE Sensitive     TETRACYCLINE <=1 SENSITIVE Sensitive     VANCOMYCIN 1 SENSITIVE Sensitive     TRIMETH/SULFA <=10  SENSITIVE Sensitive     CLINDAMYCIN <=0.25 SENSITIVE Sensitive     RIFAMPIN <=0.5 SENSITIVE Sensitive     Inducible Clindamycin NEGATIVE Sensitive     LINEZOLID 2 SENSITIVE Sensitive     * STAPHYLOCOCCUS AUREUS  Culture, blood (routine x 2)     Status: Abnormal   Collection Time: 01/23/23  9:17 PM   Specimen: BLOOD LEFT HAND  Result Value Ref Range Status   Specimen Description BLOOD LEFT HAND  Final   Special Requests   Final    BOTTLES DRAWN AEROBIC AND ANAEROBIC Blood Culture adequate volume   Culture  Setup Time   Final    GRAM POSITIVE COCCI IN BOTH AEROBIC AND ANAEROBIC BOTTLES    Culture (A)  Final    STAPHYLOCOCCUS AUREUS SUSCEPTIBILITIES PERFORMED ON PREVIOUS CULTURE WITHIN THE LAST 5 DAYS. Performed at [facility], [address] [place], [place] [address]    Report Status 01/26/2023 FINAL  Final  Blood Culture ID Panel (Reflexed)     Status: Abnormal   Collection Time: 01/23/23  9:17 PM  Result Value Ref Range Status   Enterococcus faecalis NOT DETECTED NOT DETECTED Final   Enterococcus Faecium NOT DETECTED NOT DETECTED Final   Listeria monocytogenes NOT DETECTED NOT DETECTED Final   Staphylococcus species DETECTED (A) NOT DETECTED Final    Comment: CRITICAL RESULT CALLED TO, READ BACK BY AND VERIFIED WITH: PHARMD JESSICA MILLEN [phone] [id number] BY J RAZZAK, MT    Staphylococcus aureus (BCID) DETECTED (A) NOT DETECTED Final    Comment: CRITICAL RESULT CALLED TO, READ BACK BY AND VERIFIED WITH: PHARMD JESSICA MILLEN [phone] [id number] BY J RAZZAK, MT    Staphylococcus epidermidis NOT DETECTED NOT DETECTED Final   Staphylococcus lugdunensis NOT DETECTED NOT DETECTED Final   Streptococcus species NOT DETECTED NOT DETECTED Final   Streptococcus agalactiae NOT DETECTED NOT DETECTED Final   Streptococcus pneumoniae NOT DETECTED NOT DETECTED Final   Streptococcus pyogenes NOT DETECTED NOT DETECTED Final   A.calcoaceticus-baumannii NOT DETECTED NOT DETECTED Final    Bacteroides fragilis NOT DETECTED NOT DETECTED Final   Enterobacterales NOT DETECTED NOT DETECTED Final   Enterobacter cloacae complex NOT DETECTED NOT DETECTED Final   Escherichia coli NOT DETECTED NOT DETECTED Final   Klebsiella aerogenes NOT DETECTED NOT DETECTED Final   Klebsiella oxytoca NOT DETECTED NOT DETECTED Final   Klebsiella pneumoniae NOT DETECTED NOT DETECTED Final   Proteus species NOT DETECTED NOT DETECTED Final   Salmonella species NOT DETECTED NOT DETECTED Final   Serratia marcescens NOT DETECTED NOT DETECTED Final   Haemophilus influenzae NOT DETECTED NOT DETECTED Final   Neisseria meningitidis NOT DETECTED NOT DETECTED Final   Pseudomonas aeruginosa NOT DETECTED NOT DETECTED Final   Stenotrophomonas maltophilia NOT DETECTED NOT DETECTED Final   Candida albicans NOT DETECTED NOT DETECTED Final   Candida auris NOT DETECTED NOT DETECTED Final   Candida glabrata NOT DETECTED NOT DETECTED Final   Candida krusei NOT DETECTED NOT DETECTED Final   Candida parapsilosis NOT DETECTED NOT DETECTED Final   Candida tropicalis NOT DETECTED NOT DETECTED Final   Cryptococcus neoformans/gattii NOT DETECTED NOT DETECTED Final   Meth resistant mecA/C and MREJ NOT DETECTED NOT DETECTED Final    Comment: Performed at [facility]  [facility], [address], [place], [place] [address]  Culture, blood (Routine X 2) w Reflex to ID Panel     Status: None (Preliminary result)   Collection Time: 01/25/23  9:52 AM   Specimen: BLOOD RIGHT ARM  Result Value Ref Range Status   Specimen Description BLOOD RIGHT ARM  Final   Special Requests   Final    BOTTLES DRAWN AEROBIC AND ANAEROBIC Blood Culture results may not be optimal due to an inadequate volume of blood received in culture bottles   Culture   Final    NO GROWTH 4 DAYS Performed at [facility], [address] [place], [place] [address]    Report Status PENDING  Incomplete  Culture, blood (Routine X 2) w Reflex to ID Panel     Status:  None (Preliminary result)   Collection Time: 01/25/23  9:52 AM   Specimen: BLOOD RIGHT HAND  Result Value Ref Range Status   Specimen Description BLOOD RIGHT HAND  Final   Special Requests   Final    BOTTLES DRAWN AEROBIC AND ANAEROBIC Blood Culture results may not be optimal due to an inadequate volume of blood received in culture bottles   Culture   Final    NO GROWTH 4 DAYS Performed at [facility], [address], [place], [place] [address]    Report Status PENDING  Incomplete  MRSA Next Gen by PCR, Nasal     Status: None   Collection Time: 01/29/23  7:41 AM   Specimen: Nasal Mucosa; Nasal Swab  Result Value Ref Range Status   MRSA by PCR Next Gen NOT DETECTED NOT DETECTED Final    Comment: (NOTE) The GeneXpert MRSA Assay (FDA approved for NASAL specimens only), is one component of a comprehensive MRSA colonization surveillance program. It is not intended to diagnose MRSA infection nor to guide or monitor treatment for MRSA infections. Test performance is not FDA approved in patients less than 33 years old. Performed at [facility], [address] [place], [place] [address]     Antimicrobials: Anti-infectives (From admission, onward)    Start     Dose/Rate Route Frequency Ordered Stop   01/28/23 0000  ceFAZolin (ANCEF) IVPB        2 g Intravenous Every 12 hours 01/28/23 1048 02/23/23 2359   01/25/23 1000  ceFAZolin (ANCEF) IVPB 2g/100 mL premix        2 g 200 mL/hr over 30 Minutes Intravenous Every 12 hours 01/25/23 0815     01/25/23 0812  ertapenem (INVANZ) 500 mg in sodium chloride 0.9 % 50 mL IVPB  Status:  Discontinued        500 mg 100 mL/hr over 30 Minutes Intravenous Every 24 hours 01/25/23 0812 01/25/23 1952   01/25/23 0800  ertapenem (INVANZ) 1 g in sodium chloride 0.9 % 100 mL IVPB  Status:  Discontinued        1 g 200 mL/hr over 30 Minutes Intravenous Every 24 hours 01/25/23 0700 01/25/23 0812   01/25/23 0745  ertapenem [facility]) injection 1 g   Status:  Discontinued        1 g Intramuscular Every 24 hours 01/25/23 0655 01/25/23 0700   01/23/23 2200  meropenem (MERREM) 500 mg in sodium chloride 0.9 % 100 mL IVPB  Status:  Discontinued        500 mg 200 mL/hr over 30 Minutes Intravenous Every 8 hours 01/23/23 2055 01/23/23 2109   01/23/23 2200  meropenem (MERREM) 500 mg in sodium chloride 0.9 % 100 mL IVPB  Status:  Discontinued        500 mg 200 mL/hr over 30 Minutes Intravenous Every 12 hours 01/23/23 2109 01/25/23 0655      Culture/Microbiology    Component Value Date/Time   SDES BLOOD RIGHT ARM 01/25/2023 0952   SDES BLOOD RIGHT HAND 01/25/2023 0952   SPECREQUEST  01/25/2023 4742    BOTTLES DRAWN AEROBIC AND ANAEROBIC Blood Culture results may not be optimal due to an inadequate volume of blood received in culture bottles   SPECREQUEST  01/25/2023 0952    BOTTLES DRAWN AEROBIC AND ANAEROBIC Blood Culture results may not be optimal due to an inadequate volume of blood received in culture bottles   CULT  01/25/2023 0952    NO GROWTH 4 DAYS Performed at [facility], [address], [place], [place] [address]    CULT  01/25/2023 [phone]    NO GROWTH 4 DAYS Performed at [facility], [address] [place], [place] [address]    REPTSTATUS PENDING 01/25/2023 5188   REPTSTATUS PENDING 01/25/2023 4166    adiology Studies: [place] EKG SITE RITE  Result Date: 01/28/2023 If Site Rite image not attached, placement could not be confirmed due to current cardiac rhythm.    LOS: 6 days   Total time spent in review of labs and imaging, patient evaluation, formulation of plan, documentation and communication with family: 35 minutes  Lanae Boast, MD [facility]  01/29/2023, 12:58 PM

## 2023-01-29 NOTE — Plan of Care (Signed)

## 2023-01-30 DIAGNOSIS — R531 Weakness: Secondary | ICD-10-CM | POA: Diagnosis not present

## 2023-01-30 LAB — BASIC METABOLIC PANEL
Anion gap: 11 (ref 5–15)
BUN: 23 mg/dL (ref 8–23)
CO2: 22 mmol/L (ref 22–32)
Calcium: 8 mg/dL — ABNORMAL LOW (ref 8.9–10.3)
Chloride: 100 mmol/L (ref 98–111)
Creatinine, Ser: 1.24 mg/dL (ref 0.61–1.24)
GFR, Estimated: 55 mL/min — ABNORMAL LOW (ref >60)
Glucose, Bld: 109 mg/dL — ABNORMAL HIGH (ref 70–99)
Potassium: 3.8 mmol/L (ref 3.5–5.1)
Sodium: 133 mmol/L — ABNORMAL LOW (ref 135–145)

## 2023-01-30 LAB — CBC
HCT: 32.6 % — ABNORMAL LOW (ref 39.0–52.0)
Hemoglobin: 10.1 g/dL — ABNORMAL LOW (ref 13.0–17.0)
MCH: 27.7 pg (ref 26.0–34.0)
MCHC: 31 g/dL (ref 30.0–36.0)
MCV: 89.6 fL (ref 80.0–100.0)
Platelets: 675 10*3/uL — ABNORMAL HIGH (ref 150–400)
RBC: 3.64 MIL/uL — ABNORMAL LOW (ref 4.22–5.81)
RDW: 14.9 % (ref 11.5–15.5)
WBC: 12.7 10*3/uL — ABNORMAL HIGH (ref 4.0–10.5)
nRBC: 0 % (ref 0.0–0.2)

## 2023-01-30 LAB — CULTURE, BLOOD (ROUTINE X 2)
Culture: NO GROWTH
Culture: NO GROWTH

## 2023-01-30 NOTE — Plan of Care (Signed)

## 2023-01-30 NOTE — Progress Notes (Signed)
Report given to [facility]

## 2023-01-30 NOTE — Care Management Important Message (Signed)
Important Message  Patient Details  Name: Andre Casey. MRN: [id number] Date of Birth: 10-24-1929   Important Message Given:  Yes - Medicare IM     Kebrina Friend 01/30/2023, 3:18 PM

## 2023-01-30 NOTE — TOC Transition Note (Signed)
Transition of Care [facility]) - CM/SW Discharge Note   Patient Details  Name: Andre Casey. MRN: [id number] Date of Birth: 03-Jul-1929  Transition of Care [facility]) CM/SW Contact:  Neng Albee A [place], LCSWA Phone Number: 01/30/2023, 10:58 AM   Clinical Narrative:     Patient will DC to: [facility]  Anticipated DC date: 01/30/23  Family notified: Ray Church  Transport bySharin Mons  Reference ID  [id number]   Patient's authorization for insurance approved. Reference ID listed above. Per MD patient ready for DC to [facility]. RN, patient, patient's family, and facility notified of DC. Discharge Summary and FL2 sent to facility. RN to call report prior to discharge [phone] Room 708). DC packet on chart. Ambulance transport requested for patient.     CSW will sign off for now as social work intervention is no longer needed. Please consult [place] again if new needs arise.   Final next level of care: Skilled Nursing Facility Barriers to Discharge: Barriers Resolved   Patient Goals and CMS Choice      Discharge Placement                Patient chooses bed at: [facility]  Patient to be transferred to facility by: [facility] Name of family member notified: Ray Church Patient and family notified of of transfer: 01/30/23  Discharge Plan and Services Additional resources added to the After Visit Summary for   In-house Referral: Clinical Social Work                                   Social Determinants of Health (SDOH) Interventions SDOH Screenings   Food Insecurity: Patient Declined (01/24/2023)  Transportation Needs: Patient Declined (01/24/2023)  Utilities: Patient Declined (01/24/2023)  Tobacco Use: Medium Risk (01/23/2023)     Readmission Risk Interventions     No data to display

## 2023-02-16 ENCOUNTER — Inpatient Hospital Stay: Payer: Self-pay | Admitting: Internal Medicine

## 2023-02-16 ENCOUNTER — Telehealth: Payer: Self-pay

## 2023-02-16 NOTE — Telephone Encounter (Signed)
Verbal orders given to [facility] at [facility]. Per Dr.Comer - pull PICC line after last dose if IV cefazolin on 02/21/2023. MD orders also faxed to SNF. Fax number [phone]. Patient doesn't need a follow up at ID.    Harshith Pursell Lesli Albee, CMA

## 2023-03-12 ENCOUNTER — Encounter: Payer: Self-pay | Admitting: Family Medicine

## 2023-04-08 ENCOUNTER — Other Ambulatory Visit: Payer: Self-pay | Admitting: Family Medicine

## 2023-04-08 ENCOUNTER — Ambulatory Visit
Admission: RE | Admit: 2023-04-08 | Discharge: 2023-04-08 | Disposition: A | Discharge status: Home / Self Care | Payer: Medicare HMO | Source: Ambulatory Visit | Attending: Family Medicine | Admitting: Family Medicine

## 2023-04-08 DIAGNOSIS — R059 Cough, unspecified: Secondary | ICD-10-CM

## 2023-05-13 ENCOUNTER — Inpatient Hospital Stay (HOSPITAL_COMMUNITY)
Admission: EM | Admit: 2023-05-13 | Discharge: 2023-05-19 | DRG: 698 | Disposition: A | Discharge status: Home Health | Attending: Internal Medicine | Admitting: Internal Medicine

## 2023-05-13 ENCOUNTER — Other Ambulatory Visit: Payer: Self-pay

## 2023-05-13 ENCOUNTER — Encounter (HOSPITAL_COMMUNITY): Payer: Self-pay

## 2023-05-13 ENCOUNTER — Emergency Department (HOSPITAL_COMMUNITY)

## 2023-05-13 DIAGNOSIS — N1832 Chronic kidney disease, stage 3b: Secondary | ICD-10-CM | POA: Diagnosis present

## 2023-05-13 DIAGNOSIS — N189 Chronic kidney disease, unspecified: Secondary | ICD-10-CM | POA: Diagnosis present

## 2023-05-13 DIAGNOSIS — F0393 Unspecified dementia, unspecified severity, with mood disturbance: Secondary | ICD-10-CM | POA: Diagnosis present

## 2023-05-13 DIAGNOSIS — E1122 Type 2 diabetes mellitus with diabetic chronic kidney disease: Secondary | ICD-10-CM | POA: Diagnosis present

## 2023-05-13 DIAGNOSIS — E7800 Pure hypercholesterolemia, unspecified: Secondary | ICD-10-CM | POA: Diagnosis present

## 2023-05-13 DIAGNOSIS — R7881 Bacteremia: Secondary | ICD-10-CM | POA: Diagnosis not present

## 2023-05-13 DIAGNOSIS — Z8249 Family history of ischemic heart disease and other diseases of the circulatory system: Secondary | ICD-10-CM | POA: Diagnosis not present

## 2023-05-13 DIAGNOSIS — Z1152 Encounter for screening for COVID-19: Secondary | ICD-10-CM | POA: Diagnosis not present

## 2023-05-13 DIAGNOSIS — J45909 Unspecified asthma, uncomplicated: Secondary | ICD-10-CM | POA: Diagnosis present

## 2023-05-13 DIAGNOSIS — D630 Anemia in neoplastic disease: Secondary | ICD-10-CM | POA: Diagnosis present

## 2023-05-13 DIAGNOSIS — T83518A Infection and inflammatory reaction due to other urinary catheter, initial encounter: Secondary | ICD-10-CM | POA: Diagnosis present

## 2023-05-13 DIAGNOSIS — C61 Malignant neoplasm of prostate: Secondary | ICD-10-CM | POA: Diagnosis present

## 2023-05-13 DIAGNOSIS — Z66 Do not resuscitate: Secondary | ICD-10-CM | POA: Diagnosis present

## 2023-05-13 DIAGNOSIS — Z888 Allergy status to other drugs, medicaments and biological substances status: Secondary | ICD-10-CM

## 2023-05-13 DIAGNOSIS — B952 Enterococcus as the cause of diseases classified elsewhere: Secondary | ICD-10-CM | POA: Diagnosis not present

## 2023-05-13 DIAGNOSIS — F32A Depression, unspecified: Secondary | ICD-10-CM | POA: Diagnosis present

## 2023-05-13 DIAGNOSIS — Z96611 Presence of right artificial shoulder joint: Secondary | ICD-10-CM | POA: Diagnosis present

## 2023-05-13 DIAGNOSIS — I252 Old myocardial infarction: Secondary | ICD-10-CM | POA: Diagnosis not present

## 2023-05-13 DIAGNOSIS — R5381 Other malaise: Secondary | ICD-10-CM | POA: Diagnosis present

## 2023-05-13 DIAGNOSIS — A4181 Sepsis due to Enterococcus: Secondary | ICD-10-CM | POA: Diagnosis present

## 2023-05-13 DIAGNOSIS — A419 Sepsis, unspecified organism: Principal | ICD-10-CM

## 2023-05-13 DIAGNOSIS — I2510 Atherosclerotic heart disease of native coronary artery without angina pectoris: Secondary | ICD-10-CM | POA: Diagnosis present

## 2023-05-13 DIAGNOSIS — Z79899 Other long term (current) drug therapy: Secondary | ICD-10-CM | POA: Diagnosis not present

## 2023-05-13 DIAGNOSIS — I129 Hypertensive chronic kidney disease with stage 1 through stage 4 chronic kidney disease, or unspecified chronic kidney disease: Secondary | ICD-10-CM | POA: Diagnosis present

## 2023-05-13 DIAGNOSIS — Z7982 Long term (current) use of aspirin: Secondary | ICD-10-CM

## 2023-05-13 DIAGNOSIS — Y846 Urinary catheterization as the cause of abnormal reaction of the patient, or of later complication, without mention of misadventure at the time of the procedure: Secondary | ICD-10-CM | POA: Diagnosis present

## 2023-05-13 DIAGNOSIS — I447 Left bundle-branch block, unspecified: Secondary | ICD-10-CM | POA: Diagnosis present

## 2023-05-13 DIAGNOSIS — N390 Urinary tract infection, site not specified: Secondary | ICD-10-CM

## 2023-05-13 DIAGNOSIS — N1831 Chronic kidney disease, stage 3a: Secondary | ICD-10-CM | POA: Diagnosis not present

## 2023-05-13 DIAGNOSIS — Z955 Presence of coronary angioplasty implant and graft: Secondary | ICD-10-CM

## 2023-05-13 DIAGNOSIS — R54 Age-related physical debility: Secondary | ICD-10-CM | POA: Diagnosis present

## 2023-05-13 DIAGNOSIS — N179 Acute kidney failure, unspecified: Secondary | ICD-10-CM | POA: Diagnosis present

## 2023-05-13 DIAGNOSIS — I10 Essential (primary) hypertension: Secondary | ICD-10-CM | POA: Diagnosis present

## 2023-05-13 DIAGNOSIS — A498 Other bacterial infections of unspecified site: Secondary | ICD-10-CM | POA: Diagnosis not present

## 2023-05-13 DIAGNOSIS — Z923 Personal history of irradiation: Secondary | ICD-10-CM | POA: Diagnosis not present

## 2023-05-13 DIAGNOSIS — Z85828 Personal history of other malignant neoplasm of skin: Secondary | ICD-10-CM | POA: Diagnosis not present

## 2023-05-13 DIAGNOSIS — D631 Anemia in chronic kidney disease: Secondary | ICD-10-CM | POA: Diagnosis present

## 2023-05-13 DIAGNOSIS — Z87891 Personal history of nicotine dependence: Secondary | ICD-10-CM | POA: Diagnosis not present

## 2023-05-13 DIAGNOSIS — E785 Hyperlipidemia, unspecified: Secondary | ICD-10-CM | POA: Diagnosis present

## 2023-05-13 DIAGNOSIS — Z823 Family history of stroke: Secondary | ICD-10-CM

## 2023-05-13 DIAGNOSIS — Z8619 Personal history of other infectious and parasitic diseases: Secondary | ICD-10-CM

## 2023-05-13 DIAGNOSIS — Z7951 Long term (current) use of inhaled steroids: Secondary | ICD-10-CM

## 2023-05-13 DIAGNOSIS — Z825 Family history of asthma and other chronic lower respiratory diseases: Secondary | ICD-10-CM

## 2023-05-13 HISTORY — DX: Unspecified asthma, uncomplicated: J45.909

## 2023-05-13 LAB — I-STAT CG4 LACTIC ACID, ED
Lactic Acid, Venous: 1.7 mmol/L (ref 0.5–1.9)
Lactic Acid, Venous: 1 mmol/L (ref 0.5–1.9)

## 2023-05-13 LAB — APTT: aPTT: 28 s (ref 24–36)

## 2023-05-13 LAB — CBC
HCT: 37.4 % — ABNORMAL LOW (ref 39.0–52.0)
Hemoglobin: 11.9 g/dL — ABNORMAL LOW (ref 13.0–17.0)
MCH: 28.3 pg (ref 26.0–34.0)
MCHC: 31.8 g/dL (ref 30.0–36.0)
MCV: 89 fL (ref 80.0–100.0)
Platelets: 254 10*3/uL (ref 150–400)
RBC: 4.2 MIL/uL — ABNORMAL LOW (ref 4.22–5.81)
RDW: 14.1 % (ref 11.5–15.5)
WBC: 15.5 10*3/uL — ABNORMAL HIGH (ref 4.0–10.5)
nRBC: 0 % (ref 0.0–0.2)

## 2023-05-13 LAB — CBC WITH DIFFERENTIAL/PLATELET
Abs Immature Granulocytes: 0.17 10*3/uL — ABNORMAL HIGH (ref 0.00–0.07)
Basophils Absolute: 0.1 10*3/uL (ref 0.0–0.1)
Basophils Relative: 0 %
Eosinophils Absolute: 0 10*3/uL (ref 0.0–0.5)
Eosinophils Relative: 0 %
HCT: 41.4 % (ref 39.0–52.0)
Hemoglobin: 13.1 g/dL (ref 13.0–17.0)
Immature Granulocytes: 1 %
Lymphocytes Relative: 8 %
Lymphs Abs: 1.6 10*3/uL (ref 0.7–4.0)
MCH: 28.5 pg (ref 26.0–34.0)
MCHC: 31.6 g/dL (ref 30.0–36.0)
MCV: 90 fL (ref 80.0–100.0)
Monocytes Absolute: 1.8 10*3/uL — ABNORMAL HIGH (ref 0.1–1.0)
Monocytes Relative: 10 %
Neutro Abs: 14.8 10*3/uL — ABNORMAL HIGH (ref 1.7–7.7)
Neutrophils Relative %: 81 %
Platelets: 284 10*3/uL (ref 150–400)
RBC: 4.6 MIL/uL (ref 4.22–5.81)
RDW: 14.1 % (ref 11.5–15.5)
WBC: 18.5 10*3/uL — ABNORMAL HIGH (ref 4.0–10.5)
nRBC: 0 % (ref 0.0–0.2)

## 2023-05-13 LAB — URINALYSIS, W/ REFLEX TO CULTURE (INFECTION SUSPECTED)
Bacteria, UA: NONE SEEN
Bilirubin Urine: NEGATIVE
Glucose, UA: NEGATIVE mg/dL
Ketones, ur: NEGATIVE mg/dL
Nitrite: NEGATIVE
Protein, ur: 100 mg/dL — AB
RBC / HPF: >50 RBC/hpf (ref 0–5)
Specific Gravity, Urine: 1.01 (ref 1.005–1.030)
pH: 7 (ref 5.0–8.0)

## 2023-05-13 LAB — CREATININE, SERUM
Creatinine, Ser: 1.64 mg/dL — ABNORMAL HIGH (ref 0.61–1.24)
GFR, Estimated: 39 mL/min — ABNORMAL LOW (ref >60)

## 2023-05-13 LAB — RESP PANEL BY RT-PCR (RSV, FLU A&B, COVID)  RVPGX2
Influenza A by PCR: NEGATIVE
Influenza B by PCR: NEGATIVE
Resp Syncytial Virus by PCR: NEGATIVE
SARS Coronavirus 2 by RT PCR: NEGATIVE

## 2023-05-13 LAB — COMPREHENSIVE METABOLIC PANEL
ALT: 13 U/L (ref 0–44)
AST: 22 U/L (ref 15–41)
Albumin: 3.7 g/dL (ref 3.5–5.0)
Alkaline Phosphatase: 79 U/L (ref 38–126)
Anion gap: 15 (ref 5–15)
BUN: 21 mg/dL (ref 8–23)
CO2: 21 mmol/L — ABNORMAL LOW (ref 22–32)
Calcium: 9.1 mg/dL (ref 8.9–10.3)
Chloride: 98 mmol/L (ref 98–111)
Creatinine, Ser: 1.63 mg/dL — ABNORMAL HIGH (ref 0.61–1.24)
GFR, Estimated: 40 mL/min — ABNORMAL LOW (ref >60)
Glucose, Bld: 117 mg/dL — ABNORMAL HIGH (ref 70–99)
Potassium: 3.5 mmol/L (ref 3.5–5.1)
Sodium: 134 mmol/L — ABNORMAL LOW (ref 135–145)
Total Bilirubin: 0.8 mg/dL (ref 0.0–1.2)
Total Protein: 7.8 g/dL (ref 6.5–8.1)

## 2023-05-13 LAB — PROTIME-INR
INR: 1.2 (ref 0.8–1.2)
Prothrombin Time: 14.9 s (ref 11.4–15.2)

## 2023-05-13 MED ORDER — DOCUSATE SODIUM 100 MG PO CAPS
100.0000 mg | ORAL_CAPSULE | Freq: Two times a day (BID) | ORAL | Status: DC
  Administered 2023-05-14 – 2023-05-19 (×10): 100 mg via ORAL
  Filled 2023-05-13 (×12): qty 1

## 2023-05-13 MED ORDER — SODIUM CHLORIDE 0.9% FLUSH
10.0000 mL | Freq: Two times a day (BID) | INTRAVENOUS | Status: DC
  Administered 2023-05-14 – 2023-05-17 (×7): 10 mL
  Administered 2023-05-18: 20 mL
  Administered 2023-05-18 – 2023-05-19 (×2): 10 mL

## 2023-05-13 MED ORDER — ACETAMINOPHEN 650 MG RE SUPP: 650.0000 mg | Freq: Four times a day (QID) | RECTAL | Status: DC | PRN

## 2023-05-13 MED ORDER — HEPARIN SODIUM (PORCINE) 5000 UNIT/ML IJ SOLN
5000.0000 [IU] | Freq: Three times a day (TID) | INTRAMUSCULAR | Status: DC
  Administered 2023-05-14 – 2023-05-19 (×17): 5000 [IU] via SUBCUTANEOUS
  Filled 2023-05-13 (×17): qty 1

## 2023-05-13 MED ORDER — CHLORHEXIDINE GLUCONATE CLOTH 2 % EX PADS
6.0000 | MEDICATED_PAD | Freq: Every day | CUTANEOUS | Status: DC
  Administered 2023-05-14 – 2023-05-19 (×6): 6 via TOPICAL

## 2023-05-13 MED ORDER — ACETAMINOPHEN 325 MG PO TABS
650.0000 mg | ORAL_TABLET | Freq: Four times a day (QID) | ORAL | Status: DC | PRN
  Administered 2023-05-15 – 2023-05-18 (×3): 650 mg via ORAL
  Filled 2023-05-13 (×3): qty 2

## 2023-05-13 MED ORDER — SODIUM CHLORIDE 0.9 % IV SOLN
2.0000 g | Freq: Once | INTRAVENOUS | Status: AC
  Administered 2023-05-13: 2 g via INTRAVENOUS
  Filled 2023-05-13: qty 12.5

## 2023-05-13 MED ORDER — ONDANSETRON HCL 4 MG/2ML IJ SOLN: 4.0000 mg | Freq: Four times a day (QID) | INTRAMUSCULAR | Status: DC | PRN

## 2023-05-13 MED ORDER — ACETAMINOPHEN 500 MG PO TABS
1000.0000 mg | ORAL_TABLET | Freq: Once | ORAL | Status: AC
Start: 2023-05-13 — End: 2023-05-13
  Administered 2023-05-13: 1000 mg via ORAL
  Filled 2023-05-13: qty 2

## 2023-05-13 MED ORDER — SODIUM CHLORIDE 0.9 % IV SOLN
2.0000 g | INTRAVENOUS | Status: DC
  Administered 2023-05-14: 2 g via INTRAVENOUS
  Filled 2023-05-13: qty 12.5

## 2023-05-13 MED ORDER — ONDANSETRON HCL 4 MG PO TABS: 4.0000 mg | ORAL_TABLET | Freq: Four times a day (QID) | ORAL | Status: DC | PRN

## 2023-05-13 MED ORDER — ASPIRIN 81 MG PO TBEC
81.0000 mg | DELAYED_RELEASE_TABLET | Freq: Every day | ORAL | Status: DC
  Administered 2023-05-13 – 2023-05-19 (×7): 81 mg via ORAL
  Filled 2023-05-13 (×7): qty 1

## 2023-05-13 MED ORDER — AMLODIPINE BESYLATE 10 MG PO TABS
10.0000 mg | ORAL_TABLET | Freq: Every day | ORAL | Status: DC
Start: 2023-05-13 — End: 2023-05-19
  Administered 2023-05-14 – 2023-05-18 (×5): 10 mg via ORAL
  Filled 2023-05-13 (×6): qty 1

## 2023-05-13 MED ORDER — IPRATROPIUM-ALBUTEROL 0.5-2.5 (3) MG/3ML IN SOLN: 3.0000 mL | Freq: Two times a day (BID) | RESPIRATORY_TRACT | Status: DC

## 2023-05-13 MED ORDER — SODIUM CHLORIDE 0.9 % IV SOLN: INTRAVENOUS | Status: AC

## 2023-05-13 MED ORDER — LACTATED RINGERS IV BOLUS (SEPSIS)
1000.0000 mL | Freq: Once | INTRAVENOUS | Status: AC
  Administered 2023-05-13: 1000 mL via INTRAVENOUS

## 2023-05-13 MED ORDER — IPRATROPIUM-ALBUTEROL 0.5-2.5 (3) MG/3ML IN SOLN
3.0000 mL | Freq: Two times a day (BID) | RESPIRATORY_TRACT | Status: DC
Start: 2023-05-14 — End: 2023-05-18
  Administered 2023-05-14 – 2023-05-18 (×7): 3 mL via RESPIRATORY_TRACT
  Filled 2023-05-13 (×9): qty 3

## 2023-05-13 MED ORDER — LACTATED RINGERS IV SOLN
INTRAVENOUS | Status: AC
  Administered 2023-05-13: 999 mL via INTRAVENOUS

## 2023-05-13 MED ORDER — MEMANTINE HCL 10 MG PO TABS
10.0000 mg | ORAL_TABLET | Freq: Two times a day (BID) | ORAL | Status: DC
  Administered 2023-05-14 – 2023-05-19 (×12): 10 mg via ORAL
  Filled 2023-05-13 (×12): qty 1

## 2023-05-13 MED ORDER — MIRTAZAPINE 15 MG PO TABS
15.0000 mg | ORAL_TABLET | Freq: Every day | ORAL | Status: DC
  Administered 2023-05-14 – 2023-05-18 (×6): 15 mg via ORAL
  Filled 2023-05-13 (×6): qty 1

## 2023-05-13 MED ORDER — SODIUM CHLORIDE 0.9% FLUSH: 10.0000 mL | INTRAVENOUS | Status: DC | PRN

## 2023-05-13 MED ORDER — RISAQUAD PO CAPS
1.0000 | ORAL_CAPSULE | Freq: Every day | ORAL | Status: DC
  Administered 2023-05-14 – 2023-05-18 (×5): 1 via ORAL
  Filled 2023-05-13 (×5): qty 1

## 2023-05-13 NOTE — ED Provider Notes (Signed)
 [place] EMERGENCY DEPARTMENT AT [facility] Provider Note   CSN: [id number] Arrival date & time: 05/13/23  1051     History  Chief Complaint  Patient presents with   Urinary Tract Infection    Andre Brooks. is a 89 y.o. male.  Patient is a 89 year old male with a history of hypertension, hypercholesterolemia, dementia, CAD, asthma and chronic suprapubic catheter who is presenting today with a 2-day history of fever and foul-smelling urine.  Patient suprapubic catheter was last changed about a month ago and usually gets changed monthly.  He also reports he has been having a cough and a little bit of shortness of breath associated with the fever.  His family reports he still eating and drinking well.  His mental status has not changed.  He denies any abdominal pain nausea or vomiting.  Bowel movements have been normal.  The history is provided by the patient, medical records and a relative.  Urinary Tract Infection      Home Medications Prior to Admission medications   Medication Sig Start Date End Date Taking? Authorizing Provider  acetaminophen (TYLENOL) 500 MG tablet Take 2 tablets (1,000 mg total) by mouth 3 (three) times daily. Patient taking differently: Take 500 mg by mouth 2 (two) times daily. 08/08/21  Yes Sheikh, Omair Latif, DO  amLODipine (NORVASC) 10 MG tablet Take 10 mg by mouth at bedtime.   Yes [provider]  aspirin EC 81 MG tablet Take 1 tablet (81 mg total) by mouth daily. Swallow whole. Patient taking differently: Take 81 mg by mouth at bedtime. Swallow whole. 03/17/21  Yes Lanae Boast, MD  feeding supplement (ENSURE ENLIVE / ENSURE PLUS) LIQD Take 237 mLs by mouth 2 (two) times daily between meals. 01/28/23  Yes Kc, Dayna Barker, MD  ipratropium-albuterol (DUONEB) 0.5-2.5 (3) MG/3ML SOLN Take 3 mLs by nebulization 2 (two) times daily at 10 am and 4 pm.   Yes [provider]  memantine (NAMENDA) 10 MG tablet Take 1 tablet (10 mg  total) by mouth 2 (two) times daily. 04/08/21  Yes Levert Feinstein, MD  mirtazapine (REMERON) 15 MG tablet Take 15 mg by mouth at bedtime.   Yes [provider]  Multiple Vitamin (MULTIVITAMIN WITH MINERALS) TABS tablet Take 1 tablet by mouth daily.   Yes [provider]  Probiotic Product (PROBIOTIC PO) Take 1 tablet by mouth daily with lunch.   Yes [provider]  traMADol (ULTRAM) 50 MG tablet Take 1 tablet (50 mg total) by mouth every 12 (twelve) hours as needed for up to 4 doses. Patient taking differently: Take 50 mg by mouth 2 (two) times daily. 01/28/23  Yes Lanae Boast, MD      Allergies    Sertraline, Simvastatin, Aricept [donepezil], Namzaric [memantine hcl-donepezil hcl], and Wellbutrin [bupropion]    Review of Systems   Review of Systems  Physical Exam Updated Vital Signs BP 109/72   Pulse 80   Temp 98.1 F (36.7 C) (Oral)   Resp 19   Ht 5\' 7"  (1.702 m)   Wt 55 kg   SpO2 95%   BMI 18.99 kg/m  Physical Exam Vitals and nursing note reviewed.  Constitutional:      General: He is not in acute distress.    Appearance: He is well-developed.  HENT:     Head: Normocephalic and atraumatic.  Eyes:     Conjunctiva/sclera: Conjunctivae normal.     Pupils: Pupils are equal, round, and reactive to light.  Cardiovascular:     Rate and Rhythm: Normal rate and regular rhythm.     Heart sounds: No murmur heard. Pulmonary:     Effort: Pulmonary effort is normal. No respiratory distress.     Breath sounds: Examination of the right-lower field reveals rhonchi. Examination of the left-lower field reveals rhonchi. Rhonchi present. No wheezing or rales.  Abdominal:     General: There is no distension.     Palpations: Abdomen is soft.     Tenderness: There is no abdominal tenderness. There is no right CVA tenderness, left CVA tenderness, guarding or rebound.     Comments: Suprapubic catheter present.  No erythema or drainage around catheter and abdomen is soft  and nontender  Musculoskeletal:        General: No tenderness. Normal range of motion.     Cervical back: Normal range of motion and neck supple.     Right lower leg: No edema.     Left lower leg: No edema.  Skin:    General: Skin is warm and dry.     Findings: No erythema or rash.  Neurological:     Mental Status: He is alert and oriented to person, place, and time. Mental status is at baseline.  Psychiatric:        Behavior: Behavior normal.     ED Results / Procedures / Treatments   Labs (all labs ordered are listed, but only abnormal results are displayed) Labs Reviewed  COMPREHENSIVE METABOLIC PANEL - Abnormal; Notable for the following components:      Result Value   Sodium 134 (*)    CO2 21 (*)    Glucose, Bld 117 (*)    Creatinine, Ser 1.63 (*)    GFR, Estimated 40 (*)    All other components within normal limits  CBC WITH DIFFERENTIAL/PLATELET - Abnormal; Notable for the following components:   WBC 18.5 (*)    Neutro Abs 14.8 (*)    Monocytes Absolute 1.8 (*)    Abs Immature Granulocytes 0.17 (*)    All other components within normal limits  URINALYSIS, W/ REFLEX TO CULTURE (INFECTION SUSPECTED) - Abnormal; Notable for the following components:   Color, Urine STRAW (*)    Hgb urine dipstick MODERATE (*)    Protein, ur 100 (*)    Leukocytes,Ua LARGE (*)    All other components within normal limits  RESP PANEL BY RT-PCR (RSV, FLU A&B, COVID)  RVPGX2  CULTURE, BLOOD (ROUTINE X 2)  CULTURE, BLOOD (ROUTINE X 2)  URINE CULTURE  PROTIME-INR  APTT  I-STAT CG4 LACTIC ACID, ED  I-STAT CG4 LACTIC ACID, ED    EKG EKG Interpretation Date/Time:  Wednesday May 13 2023 12:32:10 EDT Ventricular Rate:  102 PR Interval:  154 QRS Duration:  125 QT Interval:  377 QTC Calculation: 492 R Axis:   -74  Text Interpretation: Sinus tachycardia Atrial premature complexes Left bundle branch block Artifact No significant change since last tracing Confirmed by Gwyneth Sprout  ([id number]) on 05/13/2023 12:55:49 PM  Radiology DG Chest Port 1 View Result Date: 05/13/2023 CLINICAL DATA:  Questionable sepsis - evaluate for abnormality. Fever. Dementia. EXAM: PORTABLE CHEST 1 VIEW COMPARISON:  04/08/2023. FINDINGS: Bilateral lung fields are clear. Bilateral costophrenic angles are clear. Normal cardio-mediastinal silhouette. No acute osseous abnormalities. Right reverse shoulder arthroplasty noted. The soft tissues are within normal limits. IMPRESSION: No active disease. Electronically Signed   By: Jules Schick M.D.   On: 05/13/2023 14:39    Procedures Procedures  Medications Ordered in ED Medications  lactated ringers infusion (999 mLs Intravenous New Bag/Given 05/13/23 1421)  lactated ringers bolus 1,000 mL (0 mLs Intravenous Stopped 05/13/23 1434)  ceFEPIme (MAXIPIME) 2 g in sodium chloride 0.9 % 100 mL IVPB (0 g Intravenous Stopped 05/13/23 1434)  acetaminophen (TYLENOL) tablet 1,000 mg (1,000 mg Oral Given 05/13/23 1313)    ED Course/ Medical Decision Making/ A&P                                 Medical Decision Making Amount and/or Complexity of Data Reviewed Independent Historian: caregiver External Data Reviewed: notes. Labs: ordered. Decision-making details documented in ED Course. Radiology: ordered and independent interpretation performed. Decision-making details documented in ED Course. ECG/medicine tests: ordered and independent interpretation performed. Decision-making details documented in ED Course.  Risk OTC drugs. Prescription drug management. Decision regarding hospitalization.   Pt with multiple medical problems and comorbidities and presenting today with a complaint that caries a high risk for morbidity and mortality.  Here today with 2 days of fever which is not improving.  Patient is hemodynamically stable but febrile here.  He is mentating normally.  Concern for sepsis from urinary source versus respiratory source as patient has also had a  cough and congestion.  Suprapubic catheter was changed.  Sepsis order set initiated and patient given a dose of cefepime due to his chronic indwelling Foley catheter.   I have independently visualized and interpreted pt's images today.  CXR wnl.  I independently interpreted patient's labs and EKG.  EKG without acute findings today, lactic acid within normal limits, viral panel within normal limits, CMP with AKI today with creatinine of 1.6 from baseline of 1.2 and CBC with leukocytosis of 18.5.  Patient had fluids bolus and has been hemodynamically stable at this time.  Urine still pending.  Patient checked out to Dr. Silverio Lay.  Will likely need admission based on the above findings.         Final Clinical Impression(s) / ED Diagnoses Final diagnoses:  None    Rx / DC Orders ED Discharge Orders     None         Gwyneth Sprout, MD 05/13/23 1542

## 2023-05-13 NOTE — ED Triage Notes (Addendum)
 Patient BIB [facility] from home. Has a suprapubic catheter with strong urine odor. Has dementia. Family said patient had a fever at home for 2 days. Urine soiled patients pants. Catheter was changed last month, gets changed monthly per granddaughter.

## 2023-05-13 NOTE — ED Notes (Signed)
 X-ray at bedside
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
## 2023-05-13 NOTE — ED Provider Notes (Signed)
  Physical Exam  BP 109/72   Pulse 80   Temp 98.1 F (36.7 C) (Oral)   Resp 19   Ht 5\' 7"  (1.702 m)   Wt 55 kg   SpO2 95%   BMI 18.99 kg/m   Physical Exam  Procedures  Procedures  ED Course / MDM    Medical Decision Making Care assumed at 3 PM.  Patient is here with fever and purulent drainage from the suprapubic catheter.  The catheter was changed by the previous provider.  Signout pending urinalysis and admission  4:09 PM I reviewed patient's labs and white blood cell count is 18.  Lactate is normal.  UA showed large leukocytes as well as 20-50 white blood cells.  Patient received IV cefepime.  Urine culture was sent.  Patient will be admitted for sepsis from UTI  Problems Addressed: Sepsis, due to unspecified organism, unspecified whether acute organ dysfunction present [facility]): acute illness or injury Urinary tract infection with hematuria, site unspecified: acute illness or injury  Amount and/or Complexity of Data Reviewed Labs: ordered. Decision-making details documented in ED Course. Radiology: ordered and independent interpretation performed. Decision-making details documented in ED Course.  Risk OTC drugs. Prescription drug management.    CRITICAL CARE Performed by: Richardean Canal   Total critical care time: 37 minutes  Critical care time was exclusive of separately billable procedures and treating other patients.  Critical care was necessary to treat or prevent imminent or life-threatening deterioration.  Critical care was time spent personally by me on the following activities: development of treatment plan with patient and/or surrogate as well as nursing, discussions with consultants, evaluation of patient's response to treatment, examination of patient, obtaining history from patient or surrogate, ordering and performing treatments and interventions, ordering and review of laboratory studies, ordering and review of radiographic studies, pulse oximetry and  re-evaluation of patient's condition.       Charlynne Pander, MD 05/13/23 [phone]

## 2023-05-13 NOTE — Sepsis Progress Note (Signed)
 Code Sepsis protocol being monitored by eLink.

## 2023-05-13 NOTE — Progress Notes (Signed)
 Pharmacy Antibiotic Note  Andre [facility]. is a 89 y.o. male admitted on 05/13/2023 with sepsis due to UTI.  Pharmacy has been consulted for Cefepime dosing.  Plan: Cefepime 2gm IV q24h Monitor renal function and cx data   Height: 5\' 7"  (170.2 cm) Weight: 55 kg (121 lb 4.1 oz) IBW/kg (Calculated) : 66.1  Temp (24hrs), Avg:99.1 F (37.3 C), Min:98.1 F (36.7 C), Max:100 F (37.8 C)  Recent Labs  Lab 05/13/23 1206 05/13/23 1231 05/13/23 1501  WBC 18.5*  --   --   CREATININE 1.63*  --   --   LATICACIDVEN  --  1.7 1.0    Estimated Creatinine Clearance: 23 mL/min (A) (by C-G formula based on SCr of 1.63 mg/dL (H)).    Allergies  Allergen Reactions   Sertraline Diarrhea   Simvastatin Diarrhea   Aricept [Donepezil] Nausea Only    Report upset stomach - unable to tolerate.   Namzaric [Memantine Hcl-Donepezil Hcl] Other (See Comments)    Stomach upset  Pt able to take Memantine by itself.   Wellbutrin [Bupropion] Anxiety    Antimicrobials this admission: 3/19 Cefepime >>   Dose adjustments this admission:  Microbiology results: 3/19 BCx:  3/19 UCx:   3/19 Resp PCR: negative  Thank you for allowing pharmacy to be a part of this patient's care.  Junita Push PharmD 05/13/2023 4:22 PM

## 2023-05-14 DIAGNOSIS — A419 Sepsis, unspecified organism: Secondary | ICD-10-CM | POA: Diagnosis not present

## 2023-05-14 LAB — COMPREHENSIVE METABOLIC PANEL
ALT: 10 U/L (ref 0–44)
AST: 17 U/L (ref 15–41)
Albumin: 2.8 g/dL — ABNORMAL LOW (ref 3.5–5.0)
Alkaline Phosphatase: 62 U/L (ref 38–126)
Anion gap: 10 (ref 5–15)
BUN: 24 mg/dL — ABNORMAL HIGH (ref 8–23)
CO2: 22 mmol/L (ref 22–32)
Calcium: 8.3 mg/dL — ABNORMAL LOW (ref 8.9–10.3)
Chloride: 100 mmol/L (ref 98–111)
Creatinine, Ser: 1.58 mg/dL — ABNORMAL HIGH (ref 0.61–1.24)
GFR, Estimated: 41 mL/min — ABNORMAL LOW (ref >60)
Glucose, Bld: 109 mg/dL — ABNORMAL HIGH (ref 70–99)
Potassium: 3.3 mmol/L — ABNORMAL LOW (ref 3.5–5.1)
Sodium: 132 mmol/L — ABNORMAL LOW (ref 135–145)
Total Bilirubin: 0.9 mg/dL (ref 0.0–1.2)
Total Protein: 6.5 g/dL (ref 6.5–8.1)

## 2023-05-14 LAB — BLOOD CULTURE ID PANEL (REFLEXED) - BCID2

## 2023-05-14 LAB — CBC
HCT: 36.5 % — ABNORMAL LOW (ref 39.0–52.0)
Hemoglobin: 11.4 g/dL — ABNORMAL LOW (ref 13.0–17.0)
MCH: 28.1 pg (ref 26.0–34.0)
MCHC: 31.2 g/dL (ref 30.0–36.0)
MCV: 90.1 fL (ref 80.0–100.0)
Platelets: 234 10*3/uL (ref 150–400)
RBC: 4.05 MIL/uL — ABNORMAL LOW (ref 4.22–5.81)
RDW: 14.1 % (ref 11.5–15.5)
WBC: 12 10*3/uL — ABNORMAL HIGH (ref 4.0–10.5)
nRBC: 0 % (ref 0.0–0.2)

## 2023-05-14 LAB — PHOSPHORUS: Phosphorus: 2.9 mg/dL (ref 2.5–4.6)

## 2023-05-14 LAB — MAGNESIUM: Magnesium: 2.3 mg/dL (ref 1.7–2.4)

## 2023-05-14 MED ORDER — POTASSIUM CHLORIDE CRYS ER 20 MEQ PO TBCR
20.0000 meq | EXTENDED_RELEASE_TABLET | Freq: Two times a day (BID) | ORAL | Status: DC
  Administered 2023-05-14 – 2023-05-19 (×11): 20 meq via ORAL
  Filled 2023-05-14 (×11): qty 1

## 2023-05-14 MED ORDER — SODIUM CHLORIDE 0.9 % IV SOLN
2.0000 g | Freq: Three times a day (TID) | INTRAVENOUS | Status: DC
  Administered 2023-05-14 – 2023-05-18 (×11): 2 g via INTRAVENOUS
  Filled 2023-05-14 (×14): qty 2000

## 2023-05-14 NOTE — Progress Notes (Signed)
 [facility] Progress Note  Patient: Andre Casey.    ZOX:[id number]  DOA: 05/13/2023     Date of Service: the patient was seen and examined on 05/14/2023  Chief Complaint  Patient presents with   Urinary Tract Infection   Brief hospital course: Sheldon Sem. is a 89 y.o. male PMH significant for hypertension, hypercholesterolemia, dementia, CAD, asthma, chronic suprapubic catheter, history of prostate cancer presented in the ED with complaints of fever and foul-smelling urine for last 2 days.  Patient has suprapubic catheter and which has been changed every month.  Patient also reports having some shortness of breath associated with fever and cough.  Patient denies any abdominal pain, nausea, vomiting and recent travel or sick contacts.   ED Course: He was febrile, tachycardic, tachypneic and hypertensive in the ED. HR 103, RR 22, temp 100.1, BP 152/78, SpO2 97% on room air Labs include sodium 134, potassium 3.5, chloride 98, bicarb 21, glucose 117, BUN 21, creatinine 1.63, calcium 9.1, anion gap 15, alkaline phosphatase 79, albumin 3.7, AST 22, ALT 13, total protein 7.8, lactic acid 1.7, 1.0, WBC 18.5, hemoglobin 13.1, hematocrit 41.4, MCV 90.0, platelet 284, influenza, COVID, RSV negative, UA shows large leukocytes, moderate hemoglobin. Chest x-ray no infiltrate, urine  cultures obtained. Suprapubic catheter exchanged.   Assessment and Plan:  # Sepsis secondary to UTI, Enterococcus faecalis bacteremia  Patient presented with fever, tachycardia, tachypnea, leukocytosis, UA consistent with UTI. Lactic acid 1.2, WBC 18.2. Patient met sepsis criteria and started on empiric antibiotics S/p Cefepime changed to Ampicillin 2 g q8h as per bld Cx. Suprapubic catheter was exchanged in the ED on 05/13/2023 S/p IV fluid resuscitation. Prior urine culture shows Klebsiella sensitive to cefepime. Blood Cx 1/2 growing Enterococcus faecalis, follow sensitivity report  Follow-up  urine culture. F/u ID consult     # History of prostate cancer:  # status post suprapubic catheter: Patient follows with urology and suprapubic catheter is changed every month. Suprapubic catheter was changed in the ER.   # AKI on CKD stage IIIb: Baseline Serum creatinine is normal. Creatinine on admission 1.63. Continue IV fluid resuscitation, monitor serum creatinine.   Essential hypertension: Continue amlodipine 10 mg daily   Physical deconditioning: Needs PT and OT evaluation.   Dementia: Continue Namenda 10 mg daily   Depression Continue Remeron 15 mg daily   Asthma: Continue home inhalers.   DNR: Discussed with patient in detail he wants to be DNR/DNI   Left bundle branch block: Prior EKG shows LBBB.It chronic, denies any chest pain. Outpatient follow-up with cardiology   Body mass index is 18.99 kg/m.  Interventions:  Diet: soft diet DVT Prophylaxis: Subcutaneous Heparin    Advance goals of care discussion: DNR  Family Communication: family was not present at bedside, at the time of interview.  The pt provided permission to discuss medical plan with the family. Opportunity was given to ask question and all questions were answered satisfactorily.   Disposition:  Pt is from Home, admitted with sepsis due to UTI, bacteremia, still on IV Abx, which precludes a safe discharge. Discharge to Home, when stable and cleared by ID.  Subjective: No significant events overnight, denies any pain, no SOB, laying comfortably in the bed  Physical Exam: General: NAD, lying comfortably Appear in no distress, affect appropriate Eyes: PERRLA ENT: Oral Mucosa Clear, moist  Neck: no JVD,  Cardiovascular: S1 and S2 Present, no Murmur,  Respiratory: good respiratory effort, Bilateral Air entry equal and Decreased, no  Crackles, no wheezes Abdomen: Bowel Sound present, Soft and no tenderness, suprapubic catheter intact Skin: no rashes Extremities: no Pedal edema, no calf  tenderness Neurologic: without any new focal findings Gait not checked due to patient safety concerns  Vitals:   05/14/23 0221 05/14/23 0546 05/14/23 0812 05/14/23 1202  BP: 124/66 (!) 148/66  (!) 140/75  Pulse: 84 86  83  Resp: 16 16  18   Temp: 99.4 F (37.4 C) (!) 100.4 F (38 C)  99.9 F (37.7 C)  TempSrc:    Oral  SpO2: 97% 98% 95% 96%  Weight:      Height:        Intake/Output Summary (Last 24 hours) at 05/14/2023 1354 Last data filed at 05/14/2023 1115 Gross per 24 hour  Intake 1231.44 ml  Output 700 ml  Net 531.44 ml   Filed Weights   05/13/23 1100  Weight: 55 kg    Data Reviewed: I have personally reviewed and interpreted daily labs, tele strips, imagings as discussed above. I reviewed all nursing notes, pharmacy notes, vitals, pertinent old records I have discussed plan of care as described above with RN and patient/family.  CBC: Recent Labs  Lab 05/13/23 1206 05/13/23 1751 05/14/23 0436  WBC 18.5* 15.5* 12.0*  NEUTROABS 14.8*  --   --   HGB 13.1 11.9* 11.4*  HCT 41.4 37.4* 36.5*  MCV 90.0 89.0 90.1  PLT 284 254 234   Basic Metabolic Panel: Recent Labs  Lab 05/13/23 1206 05/13/23 1751 05/14/23 0436  NA 134*  --  132*  K 3.5  --  3.3*  CL 98  --  100  CO2 21*  --  22  GLUCOSE 117*  --  109*  BUN 21  --  24*  CREATININE 1.63* 1.64* 1.58*  CALCIUM 9.1  --  8.3*  MG  --   --  2.3  PHOS  --   --  2.9    Studies: No results found.  Scheduled Meds:  acidophilus  1 capsule Oral Q lunch   amLODipine  10 mg Oral QHS   aspirin EC  81 mg Oral Daily   Chlorhexidine Gluconate Cloth  6 each Topical Daily   docusate sodium  100 mg Oral BID   heparin  5,000 Units Subcutaneous Q8H   ipratropium-albuterol  3 mL Nebulization BID   memantine  10 mg Oral BID   mirtazapine  15 mg Oral QHS   potassium chloride  20 mEq Oral BID   sodium chloride flush  10-40 mL Intracatheter Q12H   Continuous Infusions:  sodium chloride 40 mL/hr at 05/14/23 0314    ampicillin (OMNIPEN) IV 2 g (05/14/23 1229)   PRN Meds: acetaminophen **OR** acetaminophen, ondansetron **OR** ondansetron (ZOFRAN) IV, sodium chloride flush  Time spent: 35 minutes  Author: Gillis Santa. MD [facility] 05/14/2023 1:54 PM  To reach On-call, see care teams to locate the attending and reach out to them via [email or web address]. If 7PM-7AM, please contact night-coverage If you still have difficulty reaching the attending provider, please page the [facility] (Director on Call) for [facility] on amion for assistance.

## 2023-05-14 NOTE — Plan of Care (Signed)

## 2023-05-14 NOTE — TOC Initial Note (Signed)
 Transition of Care (TOC) - Initial/Assessment Note    Patient Details  Name: Andre Casey. MRN: [id number] Date of Birth: 11/19/1929  [facility]) CM/SW Contact:    Diona Browner, LCSW Phone Number: 05/14/2023, 9:32 AM  Clinical Narrative:                 Pt from home. Pt continues medical workup. PT/OT eval pending. TOC following for d/c needs.    Barriers to Discharge: Continued Medical Work up   Patient Goals and CMS Choice Patient states their goals for this hospitalization and ongoing recovery are:: return home CMS Medicare.gov Compare Post Acute Care list provided to::  (NA) Choice offered to / list presented to : NA [place] ownership interest in [facility].provided to::  (NA)    Expected Discharge Plan and Services In-house Referral: NA     Living arrangements for the past 2 months: Single Family Home                 DME Arranged: N/A DME Agency: NA       HH Arranged: NA HH Agency: NA        Prior Living Arrangements/Services Living arrangements for the past 2 months: Single Family Home Lives with:: Relatives Patient language and need for interpreter reviewed:: Yes Do you feel safe going back to the place where you live?: Yes      Need for Family Participation in Patient Care: Yes (Comment) Care giver support system in place?: Yes (comment)   Criminal Activity/Legal Involvement Pertinent to Current Situation/Hospitalization: No - Comment as needed  Activities of Daily Living   ADL Screening (condition at time of admission) Independently performs ADLs?: Yes (appropriate for developmental age) Is the patient deaf or have difficulty hearing?: No Does the patient have difficulty seeing, even when wearing glasses/contacts?: Yes Does the patient have difficulty concentrating, remembering, or making decisions?: Yes  Permission Sought/Granted                  Emotional Assessment Appearance:: Appears stated age      Orientation: : Oriented to Place, Oriented to Self Alcohol / Substance Use: Not Applicable Psych Involvement: No (comment)  Admission diagnosis:  Sepsis (HCC) [A41.9] Urinary tract infection with hematuria, site unspecified [N39.0, R31.9] Sepsis, due to unspecified organism, unspecified whether acute organ dysfunction present [facility]) [A41.9] Patient Active Problem List   Diagnosis Date Noted   Sepsis (HCC) 05/13/2023   Generalized weakness 01/23/2023   Inadequate community resources 10/17/2021   AKI (acute kidney injury) (HCC) 07/30/2021   Anemia of chronic disease 07/30/2021   Protein-calorie malnutrition, severe 07/06/2021   Right wrist pain 07/04/2021   UTI (urinary tract infection) 07/03/2021   Hypokalemia 07/03/2021   Palliative care encounter 06/18/2021   Physical deconditioning 04/28/2021   Bandemia 04/27/2021   Severe sepsis (HCC) 04/26/2021   Essential hypertension 04/26/2021   Infection of bladder catheter (HCC) 04/25/2021   AKI on CKD-3A 04/25/2021   Hyponatremia 04/25/2021   Normocytic anemia 04/25/2021   Pancreatic mass 04/25/2021   Lab test positive for detection of COVID-19 virus 04/15/2021   Prostate cancer metastatic to multiple sites (HCC) 04/15/2021   Chronic indwelling Foley catheter 04/15/2021   Moderate late onset Alzheimer's dementia (HCC) 04/08/2021   Gait abnormality 04/08/2021   Renal insufficiency 03/12/2021   Acute lower UTI 04/30/2020   Renal mass, right 12/04/2019   LBBB (left bundle branch block) 06/07/2019   Family history of Alzheimer's disease 03/25/2018  Atherosclerotic heart disease of native coronary artery with angina pectoris (HCC) 01/15/2016   CAD-S/P PCI/DES 01/15/2016   Family history of sudden cardiac death 01/26/2016   Dyslipidemia, goal LDL below 70 01/08/2016   Paresthesia 07/18/2015   S/P shoulder replacement 04/20/2015   Malignant neoplasm of prostate (HCC) 02/09/2015   Urethral stricture 07/25/2013   History of  prostate cancer 08/04/2011   ED (erectile dysfunction) of organic origin 02/03/2011   Male urinary stress incontinence 01/31/2011   External hemorrhoids 08/02/2007   Incontinence of feces 08/02/2007   History of colonic polyps 08/02/2007   PCP:  Lupita Raider, MD Pharmacy:   [phone] - [place], [place] - [address] AT [address] [facility] [address] [place] [address] Phone: [phone] Fax: [phone]     Social Drivers of Health (SDOH) Social History: SDOH Screenings   Food Insecurity: Unknown (05/13/2023)  Housing: Unknown (05/13/2023)  Transportation Needs: No Transportation Needs (05/13/2023)  Utilities: Not At Risk (05/13/2023)  Social Connections: Moderately Isolated (05/13/2023)  Tobacco Use: Medium Risk (05/13/2023)   SDOH Interventions:     Readmission Risk Interventions    05/14/2023    9:29 AM  Readmission Risk Prevention Plan  Transportation Screening Complete  Medication Review (RN Care Manager) Complete  PCP or Specialist appointment within 3-5 days of discharge Complete  HRI or Home Care Consult Complete  SW Recovery Care/Counseling Consult Complete  Palliative Care Screening Not Applicable  Skilled Nursing Facility Complete

## 2023-05-14 NOTE — Plan of Care (Signed)
   Problem: Health Behavior/Discharge Planning: Goal: Ability to manage health-related needs will improve Outcome: Progressing   Problem: Clinical Measurements: Goal: Ability to maintain clinical measurements within normal limits will improve Outcome: Progressing Goal: Will remain free from infection Outcome: Progressing Goal: Diagnostic test results will improve Outcome: Progressing

## 2023-05-14 NOTE — Progress Notes (Signed)
 Pt complaining of pain with catheter movement and urine coming from penis. Suprapubic catheter replaced with same size catheter.

## 2023-05-14 NOTE — Progress Notes (Signed)
 PHARMACY - PHYSICIAN COMMUNICATION CRITICAL VALUE ALERT - BLOOD CULTURE IDENTIFICATION (BCID)  Andre Casey. is an 89 y.o. male who presented to [facility] on 05/13/2023 with a chief complaint of  Chief Complaint  Patient presents with   Urinary Tract Infection     Assessment:   - 1/4 bottles growing E. Faecalis with no resistance   Name of physician (or Provider) Contacted:  - Dr. Lucianne Muss   Current antibiotics: cefepime  Changes to prescribed antibiotics recommended:  - Stop cefepime and start ampicillin - Ampicillin 2 gr IV q8h   Results for orders placed or performed during the hospital encounter of 05/13/23  Blood Culture ID Panel (Reflexed) (Collected: 05/13/2023 12:16 PM)  Result Value Ref Range   Enterococcus faecalis DETECTED (A) NOT DETECTED   Enterococcus Faecium NOT DETECTED NOT DETECTED   Listeria monocytogenes NOT DETECTED NOT DETECTED   Staphylococcus species NOT DETECTED NOT DETECTED   Staphylococcus aureus (BCID) NOT DETECTED NOT DETECTED   Staphylococcus epidermidis NOT DETECTED NOT DETECTED   Staphylococcus lugdunensis NOT DETECTED NOT DETECTED   Streptococcus species NOT DETECTED NOT DETECTED   Streptococcus agalactiae NOT DETECTED NOT DETECTED   Streptococcus pneumoniae NOT DETECTED NOT DETECTED   Streptococcus pyogenes NOT DETECTED NOT DETECTED   A.calcoaceticus-baumannii NOT DETECTED NOT DETECTED   Bacteroides fragilis NOT DETECTED NOT DETECTED   Enterobacterales NOT DETECTED NOT DETECTED   Enterobacter cloacae complex NOT DETECTED NOT DETECTED   Escherichia coli NOT DETECTED NOT DETECTED   Klebsiella aerogenes NOT DETECTED NOT DETECTED   Klebsiella oxytoca NOT DETECTED NOT DETECTED   Klebsiella pneumoniae NOT DETECTED NOT DETECTED   Proteus species NOT DETECTED NOT DETECTED   Salmonella species NOT DETECTED NOT DETECTED   Serratia marcescens NOT DETECTED NOT DETECTED   Haemophilus influenzae NOT DETECTED NOT DETECTED   Neisseria  meningitidis NOT DETECTED NOT DETECTED   Pseudomonas aeruginosa NOT DETECTED NOT DETECTED   Stenotrophomonas maltophilia NOT DETECTED NOT DETECTED   Candida albicans NOT DETECTED NOT DETECTED   Candida auris NOT DETECTED NOT DETECTED   Candida glabrata NOT DETECTED NOT DETECTED   Candida krusei NOT DETECTED NOT DETECTED   Candida parapsilosis NOT DETECTED NOT DETECTED   Candida tropicalis NOT DETECTED NOT DETECTED   Cryptococcus neoformans/gattii NOT DETECTED NOT DETECTED   Vancomycin resistance NOT DETECTED NOT DETECTED    Adalberto Cole, PharmD, BCPS 05/14/2023 10:44 AM

## 2023-05-15 ENCOUNTER — Inpatient Hospital Stay (HOSPITAL_COMMUNITY)

## 2023-05-15 DIAGNOSIS — N1831 Chronic kidney disease, stage 3a: Secondary | ICD-10-CM

## 2023-05-15 DIAGNOSIS — N179 Acute kidney failure, unspecified: Secondary | ICD-10-CM

## 2023-05-15 DIAGNOSIS — A498 Other bacterial infections of unspecified site: Secondary | ICD-10-CM | POA: Diagnosis present

## 2023-05-15 DIAGNOSIS — B952 Enterococcus as the cause of diseases classified elsewhere: Secondary | ICD-10-CM | POA: Diagnosis present

## 2023-05-15 DIAGNOSIS — R7881 Bacteremia: Secondary | ICD-10-CM | POA: Diagnosis not present

## 2023-05-15 LAB — URINE CULTURE

## 2023-05-15 LAB — BASIC METABOLIC PANEL
Anion gap: 10 (ref 5–15)
BUN: 21 mg/dL (ref 8–23)
CO2: 19 mmol/L — ABNORMAL LOW (ref 22–32)
Calcium: 8.3 mg/dL — ABNORMAL LOW (ref 8.9–10.3)
Chloride: 108 mmol/L (ref 98–111)
Creatinine, Ser: 1.42 mg/dL — ABNORMAL HIGH (ref 0.61–1.24)
GFR, Estimated: 47 mL/min — ABNORMAL LOW (ref >60)
Glucose, Bld: 94 mg/dL (ref 70–99)
Potassium: 3.5 mmol/L (ref 3.5–5.1)
Sodium: 137 mmol/L (ref 135–145)

## 2023-05-15 LAB — ECHOCARDIOGRAM COMPLETE
AR max vel: 2.44 cm2
AV Area VTI: 2.35 cm2
AV Area mean vel: 2.59 cm2
AV Mean grad: 4 mmHg
AV Peak grad: 7.3 mmHg
Ao pk vel: 1.35 m/s
Area-P 1/2: 3.93 cm2
Height: 67 in
MV VTI: 2.63 cm2
S' Lateral: 2.3 cm
Single Plane A4C EF: 81.3 %
Weight: 1940.05 [oz_av]

## 2023-05-15 LAB — GLUCOSE, CAPILLARY
Glucose-Capillary: 84 mg/dL (ref 70–99)
Glucose-Capillary: 107 mg/dL — ABNORMAL HIGH (ref 70–99)
Glucose-Capillary: 90 mg/dL (ref 70–99)
Glucose-Capillary: 137 mg/dL — ABNORMAL HIGH (ref 70–99)

## 2023-05-15 LAB — CBC
HCT: 35.3 % — ABNORMAL LOW (ref 39.0–52.0)
Hemoglobin: 11 g/dL — ABNORMAL LOW (ref 13.0–17.0)
MCH: 28.1 pg (ref 26.0–34.0)
MCHC: 31.2 g/dL (ref 30.0–36.0)
MCV: 90.3 fL (ref 80.0–100.0)
Platelets: 262 10*3/uL (ref 150–400)
RBC: 3.91 MIL/uL — ABNORMAL LOW (ref 4.22–5.81)
RDW: 14 % (ref 11.5–15.5)
WBC: 7.9 10*3/uL (ref 4.0–10.5)
nRBC: 0 % (ref 0.0–0.2)

## 2023-05-15 LAB — MAGNESIUM: Magnesium: 2.2 mg/dL (ref 1.7–2.4)

## 2023-05-15 LAB — PHOSPHORUS: Phosphorus: 2.1 mg/dL — ABNORMAL LOW (ref 2.5–4.6)

## 2023-05-15 MED ORDER — PERFLUTREN LIPID MICROSPHERE
1.0000 mL | INTRAVENOUS | Status: AC | PRN
  Administered 2023-05-15: 3 mL via INTRAVENOUS

## 2023-05-15 NOTE — TOC Progression Note (Signed)
 Transition of Care (TOC) - Progression Note    Patient Details  Name: Andre Casey. MRN: [id number] Date of Birth: 12/12/1929  Transition of Care [facility]) CM/SW Contact  Otelia Santee, LCSW Phone Number: 05/15/2023, 1:55 PM  Clinical Narrative:    Met with pt who shares he lives at home alone and typically uses a RW at baseline. Pt is agreeable to having HH arranged and did not believe he had had HH in the past. Pt does not have preference for HHA. HHPT/OT has been arranged with [facility]. HH orders will need to be placed prior to discharge. Pt shares his granddaughter should be able to provide transportation at discharge. Pt declined for CSW to reach out to family to discuss disposition.    Expected Discharge Plan: Home w Home Health Services Barriers to Discharge: Continued Medical Work up  Expected Discharge Plan and Services In-house Referral: NA     Living arrangements for the past 2 months: Single Family Home                 DME Arranged: N/A DME Agency: NA       HH Arranged: PT, OT HH Agency: [facility] Date HH Agency Contacted: 05/15/23 Time HH Agency Contacted: 1355 Representative spoke with at [facility] Agency: Elnita Maxwell   Social Determinants of Health (SDOH) Interventions SDOH Screenings   Food Insecurity: Unknown (05/13/2023)  Housing: Unknown (05/13/2023)  Transportation Needs: No Transportation Needs (05/13/2023)  Utilities: Not At Risk (05/13/2023)  Social Connections: Moderately Isolated (05/13/2023)  Tobacco Use: Medium Risk (05/13/2023)    Readmission Risk Interventions    05/15/2023    1:54 PM 05/14/2023    9:29 AM  Readmission Risk Prevention Plan  Transportation Screening Complete Complete  PCP or Specialist Appt within 3-5 Days Complete   HRI or Home Care Consult Complete   Social Work Consult for Recovery Care Planning/Counseling Complete   Palliative Care Screening Not Applicable   Medication Review [employer])  Complete Complete  PCP or Specialist appointment within 3-5 days of discharge  Complete  HRI or Home Care Consult  Complete  SW Recovery Care/Counseling Consult  Complete  Palliative Care Screening  Not Applicable  Skilled Nursing Facility  Complete

## 2023-05-15 NOTE — Progress Notes (Signed)
 Progress Note   Patient: Andre Casey. OZD:[id number] DOB: [birth year] DOA: 05/13/2023     2 DOS: the patient was seen and examined on 05/15/2023   Brief hospital course:  Andre Casey. is a 89 y.o. male PMH significant for hypertension, hypercholesterolemia, dementia, CAD, asthma, chronic suprapubic catheter, history of prostate cancer presented in the ED with complaints of fever and foul-smelling urine for last 2 days.  Patient has suprapubic catheter and which has been changed every month.  Patient also reports having some shortness of breath associated with fever and cough.  Patient denies any abdominal pain, nausea, vomiting and recent travel or sick contacts.   ED Course: He was febrile, tachycardic, tachypneic and hypertensive in the ED. HR 103, RR 22, temp 100.1, BP 152/78, SpO2 97% on room air Labs include sodium 134, potassium 3.5, chloride 98, bicarb 21, glucose 117, BUN 21, creatinine 1.63, calcium 9.1, anion gap 15, alkaline phosphatase 79, albumin 3.7, AST 22, ALT 13, total protein 7.8, lactic acid 1.7, 1.0, WBC 18.5, hemoglobin 13.1, hematocrit 41.4, MCV 90.0, platelet 284, influenza, COVID, RSV negative, UA shows large leukocytes, moderate hemoglobin. Chest x-ray no infiltrate, urine  cultures obtained. Suprapubic catheter exchanged.     Assessment and Plan:   # Sepsis secondary to UTI,  Enterococcus faecalis bacteremia  Patient presented with fever, tachycardia, tachypnea, leukocytosis, pyuria Lactic acid 1.2, WBC 18.2. Patient met sepsis criteria and started on empiric antibiotics Was initially on cefepime changed to Ampicillin 2 g q8h as per bld Cx. Suprapubic catheter was exchanged in the ED on 05/13/2023 S/p IV fluid resuscitation. Blood and urine culture yielded Enterococcus faecalis sensitive to ampicillin Follow-up results of 2D echocardiogram to rule out endocarditis ID consult for recommendation for antibiotic therapy     # History of  prostate cancer:  # status post suprapubic catheter: Patient follows with urology and suprapubic catheter is changed every month. Suprapubic catheter was changed in the ER on 03/19    # AKI on CKD stage IIIb: Baseline Serum creatinine is 1.24. Creatinine on admission 1.63. Renal function improved but not back to baseline   Essential hypertension: Continue amlodipine 10 mg daily   Physical deconditioning: Awaiting PT and OT evaluation.   Dementia Depression Continue Namenda 10 mg daily Continue Remeron 15 mg daily    Asthma: Not acutely exacerbated Continue home inhalers.   DNR: Discussed with patient in detail he wants to be DNR/DNI          Subjective: Patient is seen and examined at the bedside.  No new complaints  Physical Exam: Vitals:   05/14/23 1929 05/14/23 1940 05/15/23 0441 05/15/23 0808  BP:  (!) 109/51 (!) 115/54   Pulse:  75 70   Resp:  16 16   Temp:  99.4 F (37.4 C) 97.9 F (36.6 C)   TempSrc:      SpO2: 92% 96% 95% 94%  Weight:      Height:        General: NAD, sitting up in a recliner. Appear in no distress, affect appropriate Eyes: PERRLA ENT: Oral Mucosa Clear, moist  Neck: no JVD,  Cardiovascular: S1 and S2 Present, no Murmur,  Respiratory: good respiratory effort, Bilateral Air entry equal and Decreased, no Crackles, no wheezes Abdomen: Bowel Sound present, Soft and no tenderness, suprapubic catheter intact Skin: no rashes Extremities: no Pedal edema, no calf tenderness Neurologic: without any new focal findings Gait not checked due to patient safety concerns   Data Reviewed:  White count 7.9, hemoglobin 11, Labs reviewed  Family Communication: Plan of care discussed with patient in detail.  He verbalizes understanding and agrees with the plan.  Disposition: Status is: Inpatient Remains inpatient appropriate because: Receiving IV antibiotic therapy for E faecalis bacteremia  Planned Discharge Destination: TBD    Time  spent: 34 minutes  Author: Lucile Shutters, MD 05/15/2023 10:49 AM  For on call review [email or web address].

## 2023-05-15 NOTE — Evaluation (Signed)
 Occupational Therapy Evaluation Patient Details Name: Andre Casey. MRN: [id number] DOB: 24-May-1929 Today's Date: 05/15/2023   History of Present Illness   Andre Casey. is a 89 y.o. male admitted with enterococcus faecalis infection. PMH: HTN, hypercholesterolemia, dementia, CAD, asthma, prostate CA and chronic suprapubic catheter     Clinical Impressions PTA, patient was living alone with some support from family intermittently but independent with rollator. Patient now presents with balance, activity tolerance and higher level processing deficits but open to all therapy presented and highly motivated and cooperative. Pt will require continued skilled OT services while in Acute care and recommending HHOT with family support upon discharge. Pt and step daughter report patient has all needed DME at this time.      If plan is discharge home, recommend the following:   A little help with walking and/or transfers;A little help with bathing/dressing/bathroom;Assistance with cooking/housework;Direct supervision/assist for medications management;Direct supervision/assist for financial management;Assist for transportation;Help with stairs or ramp for entrance;Supervision due to cognitive status     Functional Status Assessment   Patient has had a recent decline in their functional status and demonstrates the ability to make significant improvements in function in a reasonable and predictable amount of time.     Equipment Recommendations   None recommended by OT      Precautions/Restrictions   Precautions Precautions: Fall Restrictions Weight Bearing Restrictions Per Provider Order: No     Mobility Bed Mobility Overal bed mobility: Needs Assistance Bed Mobility: Supine to Sit     Supine to sit: Supervision     General bed mobility comments: slow to mobilize to EOB, therapist managing lines for safety    Transfers Overall transfer level: Needs  assistance Equipment used: Rolling walker (2 wheels) Transfers: Sit to/from Stand Sit to Stand: Min assist           General transfer comment: min A to power up from bed, slow to rise up, maintains forward flexed trunk in standing      Balance Overall balance assessment: Needs assistance Sitting-balance support: Feet supported Sitting balance-Leahy Scale: Good     Standing balance support: Reliant on assistive device for balance, During functional activity, Bilateral upper extremity supported Standing balance-Leahy Scale: Poor                             ADL either performed or assessed with clinical judgement   ADL Overall ADL's : Needs assistance/impaired Eating/Feeding: Modified independent   Grooming: Oral care;Wash/dry face;Wash/dry hands;Applying deodorant;Brushing hair;Set up;Sitting   Upper Body Bathing: Set up;Sitting   Lower Body Bathing: Minimal assistance;Sitting/lateral leans;Sit to/from stand   Upper Body Dressing : Set up;Sitting   Lower Body Dressing: Minimal assistance;Sitting/lateral leans;Sit to/from stand   Toilet Transfer: Minimal assistance Statistician Details (indicate cue type and reason): has suprapubic catheter in place Toileting- Clothing Manipulation and Hygiene: Minimal assistance;Sitting/lateral lean;Sit to/from stand       Functional mobility during ADLs: Minimal assistance;Rolling walker (2 wheels)       Vision Baseline Vision/History: 0 No visual deficits Ability to See in Adequate Light: 0 Adequate Patient Visual Report: No change from baseline Vision Assessment?: No apparent visual deficits     Perception Perception: Within Functional Limits       Praxis Praxis: WFL       Pertinent Vitals/Pain Pain Assessment Pain Assessment: No/denies pain     Extremity/Trunk Assessment  Cervical / Trunk Assessment Cervical / Trunk Assessment: Kyphotic   Communication Communication Communication: No  apparent difficulties   Cognition Arousal: Alert Behavior During Therapy: WFL for tasks assessed/performed               OT - Cognition Comments: mild higher level processing noted                 Following commands: Intact       Cueing  General Comments   Cueing Techniques: Verbal cues   Step daughter present for part of session            Home Living Family/patient expects to be discharged to:: Private residence Living Arrangements: Alone Available Help at Discharge: Available PRN/intermittently;Family Type of Home: House Home Access: Level entry     Home Layout: One level     Bathroom Shower/Tub: Chief Strategy Officer: Standard Bathroom Accessibility: Yes How Accessible: Accessible via wheelchair;Accessible via walker Home Equipment: Shower seat - built in;Rollator (4 wheels);Cane - single point;BSC/3in1;Grab bars - tub/shower;Grab bars - toilet;Hand held shower head;Wheelchair - Forensic psychologist (2 wheels) (taken from previous admission, pt only stating RW)   Additional Comments: DME taken from chart of previous admission - pt reports using RW for mobility currently      Prior Functioning/Environment Prior Level of Function : Independent/Modified Independent;Patient poor historian/Family not available             Mobility Comments: pt reports using RW for in home and community ambulation, reports mobilizing "when necessary" around the home, also reports a "comfortable recliner" ADLs Comments: pt reports ind with self care, light household chores and micorwave meals    OT Problem List: Decreased activity tolerance;Impaired balance (sitting and/or standing);Decreased cognition;Decreased knowledge of use of DME or AE   OT Treatment/Interventions: Self-care/ADL training;Therapeutic exercise;Energy conservation;DME and/or AE instruction;Therapeutic activities;Cognitive remediation/compensation;Patient/family education;Balance  training      OT Goals(Current goals can be found in the care plan section)   Acute Rehab OT Goals OT Goal Formulation: With patient Time For Goal Achievement: 05/29/23 Potential to Achieve Goals: Good ADL Goals Pt Will Perform Lower Body Bathing: with modified independence;sitting/lateral leans;sit to/from stand Pt Will Perform Lower Body Dressing: with modified independence;sitting/lateral leans;sit to/from stand Pt Will Transfer to Toilet: with modified independence;ambulating;grab bars   OT Frequency:  Min 2X/week       AM-PAC OT "6 Clicks" Daily Activity     Outcome Measure Help from another person eating meals?: None Help from another person taking care of personal grooming?: None Help from another person toileting, which includes using toliet, bedpan, or urinal?: A Little Help from another person bathing (including washing, rinsing, drying)?: A Little Help from another person to put on and taking off regular upper body clothing?: A Little Help from another person to put on and taking off regular lower body clothing?: A Little 6 Click Score: 20   End of Session Nurse Communication: Mobility status  Activity Tolerance: Patient tolerated treatment well Patient left: in chair;with call bell/phone within reach;with chair alarm set  OT Visit Diagnosis: Unsteadiness on feet (R26.81)                Time: 1220-1300 OT Time Calculation (min): 40 min Charges:  OT General Charges $OT Visit: 1 Visit OT Evaluation $OT Eval Moderate Complexity: 1 Mod OT Treatments $Self Care/Home Management : 8-22 mins  Josiah Nieto OT/L Acute Rehabilitation Department  [phone]  05/15/2023, 4:30 PM

## 2023-05-15 NOTE — Progress Notes (Signed)
  Echocardiogram 2D Echocardiogram has been performed.  Ocie Doyne RDCS 05/15/2023, 2:59 PM
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
## 2023-05-15 NOTE — Plan of Care (Signed)

## 2023-05-15 NOTE — Evaluation (Signed)
 Physical Therapy Evaluation Patient Details Name: Andre Casey. MRN: [id number] DOB: 07/07/1929 Today's Date: 05/15/2023  History of Present Illness  Rangel Echeverri. is a 89 y.o. male admitted with enterococcus faecalis infection. PMH: HTN, hypercholesterolemia, dementia, CAD, asthma, prostate CA and chronic suprapubic catheter  Clinical Impression  Pt admitted with above diagnosis. At baseline, pt reports living independently, no steps at home, has niece who checks on him, reports ind with amb using RW and ind with self care and microwave meals. On eval, pt needing min A to power up from EOB and CGA to supv with amb in hallway using RW. Pt with mild unsteadiness when clearing obstacles in hallway and cues for RW management to improve safety and efficiency. Recommend HHPT at d/c with family support as needed. Pt currently with functional limitations due to the deficits listed below (see PT Problem List). Pt will benefit from acute skilled PT to increase their independence and safety with mobility to allow discharge.           If plan is discharge home, recommend the following: Assistance with cooking/housework;Assist for transportation   Can travel by private vehicle        Equipment Recommendations None recommended by PT  Recommendations for Other Services       Functional Status Assessment Patient has had a recent decline in their functional status and demonstrates the ability to make significant improvements in function in a reasonable and predictable amount of time.     Precautions / Restrictions Precautions Precautions: Fall Restrictions Weight Bearing Restrictions Per Provider Order: No      Mobility  Bed Mobility Overal bed mobility: Needs Assistance Bed Mobility: Supine to Sit     Supine to sit: Supervision     General bed mobility comments: slow to mobilize to EOB, therapist managing lines for safety    Transfers Overall transfer level: Needs  assistance Equipment used: Rolling walker (2 wheels) Transfers: Sit to/from Stand Sit to Stand: Min assist           General transfer comment: min A to power up from bed, slow to rise up, maintains forward flexed trunk in standing    Ambulation/Gait Ambulation/Gait assistance: Contact guard assist, Supervision Gait Distance (Feet): 350 Feet Assistive device: Rolling walker (2 wheels) Gait Pattern/deviations: Step-through pattern, Decreased stride length, Trunk flexed Gait velocity: decreased     General Gait Details: step through gait pattern, trunk forward flexed, cued to maintain RW at closer distance, good bil foot clearance and equal step length, mild unsteadiness noted with turns and navigating around obstalces in hallway with CGA, supv for straightaways  Stairs            Wheelchair Mobility     Tilt Bed    Modified Rankin (Stroke Patients Only)       Balance Overall balance assessment: Needs assistance Sitting-balance support: Feet supported Sitting balance-Leahy Scale: Good     Standing balance support: Reliant on assistive device for balance, During functional activity, Bilateral upper extremity supported Standing balance-Leahy Scale: Poor                               Pertinent Vitals/Pain Pain Assessment Pain Assessment: No/denies pain    Home Living Family/patient expects to be discharged to:: Private residence Living Arrangements: Alone Available Help at Discharge: Available PRN/intermittently;Family Type of Home: House Home Access: Level entry       Home Layout:  One level Home Equipment: Shower seat - built in;Rollator (4 wheels);Cane - single point;BSC/3in1;Grab bars - tub/shower;Grab bars - toilet;Hand held shower head;Wheelchair - Conservation officer, historic buildings (2 wheels) (taken from previous admission, pt only stating RW) Additional Comments: DME taken from chart of previous admission - pt reports using RW for mobility  currently    Prior Function Prior Level of Function : Independent/Modified Independent;Patient poor historian/Family not available             Mobility Comments: pt reports using RW for in home and community ambulation, reports mobilizing "when necessary" around the home, also reports a "comfortable recliner" ADLs Comments: pt reports ind with self care, light household chores and micorwave meals     Extremity/Trunk Assessment   Upper Extremity Assessment Upper Extremity Assessment: Defer to OT evaluation    Lower Extremity Assessment Lower Extremity Assessment: Overall WFL for tasks assessed    Cervical / Trunk Assessment Cervical / Trunk Assessment: Kyphotic  Communication   Communication Communication: No apparent difficulties    Cognition Arousal: Alert Behavior During Therapy: WFL for tasks assessed/performed   PT - Cognitive impairments: History of cognitive impairments                       PT - Cognition Comments: pt a&o to self, DOB, hospital, year, states month is February (although it is March), knows Jackquline Bosch was president last year and a new one inducted in Jan but unsure of his name Following commands: Intact       Cueing       General Comments General comments (skin integrity, edema, etc.): on RA SpO2 >92%    Exercises     Assessment/Plan    PT Assessment Patient needs continued PT services  PT Problem List Decreased activity tolerance;Decreased balance;Decreased cognition       PT Treatment Interventions DME instruction;Gait training;Functional mobility training;Therapeutic activities;Therapeutic exercise;Balance training;Cognitive remediation;Patient/family education    PT Goals (Current goals can be found in the Care Plan section)  Acute Rehab PT Goals Patient Stated Goal: "let's go for a walk" PT Goal Formulation: With patient Time For Goal Achievement: 05/29/23 Potential to Achieve Goals: Good    Frequency Min 2X/week      Co-evaluation               AM-PAC PT "6 Clicks" Mobility  Outcome Measure Help needed turning from your back to your side while in a flat bed without using bedrails?: A Little Help needed moving from lying on your back to sitting on the side of a flat bed without using bedrails?: A Little Help needed moving to and from a bed to a chair (including a wheelchair)?: A Little Help needed standing up from a chair using your arms (e.g., wheelchair or bedside chair)?: A Little Help needed to walk in hospital room?: A Little Help needed climbing 3-5 steps with a railing? : A Little 6 Click Score: 18    End of Session Equipment Utilized During Treatment: Gait belt Activity Tolerance: Patient tolerated treatment well Patient left: in chair;with call bell/phone within reach;with chair alarm set Nurse Communication: Mobility status PT Visit Diagnosis: Other abnormalities of gait and mobility (R26.89)    Time: 5284-1324 PT Time Calculation (min) (ACUTE ONLY): 20 min   Charges:   PT Evaluation $PT Eval Low Complexity: 1 Low   PT General Charges $$ ACUTE PT VISIT: 1 Visit         Tori Asyria Kolander PT, DPT 05/15/23, 11:40  AM

## 2023-05-16 DIAGNOSIS — R7881 Bacteremia: Secondary | ICD-10-CM

## 2023-05-16 DIAGNOSIS — B952 Enterococcus as the cause of diseases classified elsewhere: Secondary | ICD-10-CM | POA: Diagnosis not present

## 2023-05-16 LAB — CULTURE, BLOOD (ROUTINE X 2)

## 2023-05-16 LAB — HEPATITIS C ANTIBODY: HCV Ab: NONREACTIVE

## 2023-05-16 LAB — HEPATITIS B SURFACE ANTIGEN: Hepatitis B Surface Ag: NONREACTIVE

## 2023-05-16 LAB — GLUCOSE, CAPILLARY
Glucose-Capillary: 96 mg/dL (ref 70–99)
Glucose-Capillary: 177 mg/dL — ABNORMAL HIGH (ref 70–99)

## 2023-05-16 LAB — HIV ANTIBODY (ROUTINE TESTING W REFLEX): HIV Screen 4th Generation wRfx: NONREACTIVE

## 2023-05-16 NOTE — Plan of Care (Signed)
   Problem: Education: Goal: Knowledge of General Education information will improve Description Including pain rating scale, medication(s)/side effects and non-pharmacologic comfort measures Outcome: Progressing   Problem: Health Behavior/Discharge Planning: Goal: Ability to manage health-related needs will improve Outcome: Progressing

## 2023-05-16 NOTE — Progress Notes (Signed)
 PROGRESS NOTE  Andre Casey.  ZOX:[id number] DOB: [birth year] DOA: 05/13/2023 PCP: Lupita Raider, MD  Consultants  Brief Narrative:  89 y.o. male PMH significant for hypertension, hypercholesterolemia, dementia, CAD, asthma, chronic suprapubic catheter, history of prostate cancer presented in the ED with complaints of fever and foul-smelling urine for last 2 days.  Patient has suprapubic catheter and which has been changed every month.  Patient also reports having some shortness of breath associated with fever and cough.  Patient denies any abdominal pain, nausea, vomiting and recent travel or sick contacts. Came to ED with same and admitted, found to have enterococcal UTI with positive blood cultures for the same.    Assessment & Plan: Sepsis secondary to UTI,  Enterococcus faecalis bacteremia  Sepsis has resolved.   Blood cultures repeated and pending. Echo with normal EF but valves unable to be visualized.   Suprapubic catheter was exchanged in the ED on 05/13/2023 ID consulted, appreciate input.  Abx narrowed to ampicillin.  Await repeat blood cx results, continue IV abx in meantime.     # History of prostate cancer:  # status post suprapubic catheter: Patient follows with urology and suprapubic catheter is changed every month. Suprapubic catheter was changed in the ER on 03/19    # AKI on CKD stage IIIb: Baseline Serum creatinine is 1.24. Creatinine on admission 1.63, now trending back down, not quite to baseline   Essential hypertension: Continue amlodipine 10 mg daily   Physical deconditioning: Awaiting PT and OT evaluation.   Dementia Depression Continue Namenda 10 mg daily Continue Remeron 15 mg daily   Asthma: Not acutely exacerbated Continue home inhalers.     DVT prophylaxis:  heparin injection 5,000 Units Start: 05/13/23 2200  Code Status:   Code Status: Limited: Do not attempt resuscitation (DNR) -DNR-LIMITED -Do Not Intubate/DNI  Level of care:  Med-Surg Status is: Inpatient   Subjective: Patient feels well today, reports memory is not good.  Did have an episode of bowel incontinence while in bed just prior to my exam.  No fevers or chills  Objective: Vitals:   05/15/23 2023 05/15/23 2146 05/16/23 0400 05/16/23 1253  BP: 122/68  113/64 130/69  Pulse: 75  76 78  Resp: 16  16 (!) 22  Temp: 98.4 F (36.9 C)  98.1 F (36.7 C) 97.8 F (36.6 C)  TempSrc:    Axillary  SpO2: 97% 96% 97% 99%  Weight:      Height:        Intake/Output Summary (Last 24 hours) at 05/16/2023 1418 Last data filed at 05/16/2023 1230 Gross per 24 hour  Intake 120 ml  Output 1475 ml  Net -1355 ml   Filed Weights   05/13/23 1100  Weight: 55 kg   Body mass index is 18.99 kg/m.  Gen: 89 y.o. male in no apparent distress.  Nontoxic Pulm: Non-labored breathing.  Clear to auscultation bilaterally.  CV: Regular rate and rhythm. No murmur, rub, or gallop. No JVD GI: Abdomen soft, non-tender, non-distended, with normoactive bowel sounds. No organomegaly or masses felt. Ext: Warm, no deformities, no pedal edema Skin: No rashes, lesions noulcers Neuro: Alert and oriented. No focal neurological deficits. Psych: Calm  Judgement and insight appear normal. Mood & affect appropriate.     I have personally reviewed the following labs and images: CBC: Recent Labs  Lab 05/13/23 1206 05/13/23 1751 05/14/23 0436 05/15/23 0410  WBC 18.5* 15.5* 12.0* 7.9  NEUTROABS 14.8*  --   --   --  HGB 13.1 11.9* 11.4* 11.0*  HCT 41.4 37.4* 36.5* 35.3*  MCV 90.0 89.0 90.1 90.3  PLT 284 254 234 262   BMP &GFR Recent Labs  Lab 05/13/23 1206 05/13/23 1751 05/14/23 0436 05/15/23 0410  NA 134*  --  132* 137  K 3.5  --  3.3* 3.5  CL 98  --  100 108  CO2 21*  --  22 19*  GLUCOSE 117*  --  109* 94  BUN 21  --  24* 21  CREATININE 1.63* 1.64* 1.58* 1.42*  CALCIUM 9.1  --  8.3* 8.3*  MG  --   --  2.3 2.2  PHOS  --   --  2.9 2.1*   Estimated Creatinine  Clearance: 26.4 mL/min (A) (by C-G formula based on SCr of 1.42 mg/dL (H)). Liver & Pancreas: Recent Labs  Lab 05/13/23 1206 05/14/23 0436  AST 22 17  ALT 13 10  ALKPHOS 79 62  BILITOT 0.8 0.9  PROT 7.8 6.5  ALBUMIN 3.7 2.8*   No results for input(s): "LIPASE", "AMYLASE" in the last 168 hours. No results for input(s): "AMMONIA" in the last 168 hours. Diabetic: No results for input(s): "HGBA1C" in the last 72 hours. Recent Labs  Lab 05/15/23 1212 05/15/23 1754 05/15/23 2022 05/16/23 0741 05/16/23 1138  GLUCAP 107* 90 137* 96 177*   Cardiac Enzymes: No results for input(s): "CKTOTAL", "CKMB", "CKMBINDEX", "TROPONINI" in the last 168 hours. No results for input(s): "PROBNP" in the last 8760 hours. Coagulation Profile: Recent Labs  Lab 05/13/23 1206  INR 1.2   Thyroid Function Tests: No results for input(s): "TSH", "T4TOTAL", "FREET4", "T3FREE", "THYROIDAB" in the last 72 hours. Lipid Profile: No results for input(s): "CHOL", "HDL", "LDLCALC", "TRIG", "CHOLHDL", "LDLDIRECT" in the last 72 hours. Anemia Panel: No results for input(s): "VITAMINB12", "FOLATE", "FERRITIN", "TIBC", "IRON", "RETICCTPCT" in the last 72 hours. Urine analysis:    Component Value Date/Time   COLORURINE STRAW (A) 05/13/2023 1500   APPEARANCEUR CLEAR 05/13/2023 1500   LABSPEC 1.010 05/13/2023 1500   PHURINE 7.0 05/13/2023 1500   GLUCOSEU NEGATIVE 05/13/2023 1500   HGBUR MODERATE (A) 05/13/2023 1500   BILIRUBINUR NEGATIVE 05/13/2023 1500   KETONESUR NEGATIVE 05/13/2023 1500   PROTEINUR 100 (A) 05/13/2023 1500   UROBILINOGEN 0.2 05/15/2007 1118   NITRITE NEGATIVE 05/13/2023 1500   LEUKOCYTESUR LARGE (A) 05/13/2023 1500   Sepsis Labs: Invalid input(s): "PROCALCITONIN", "LACTICIDVEN"  Microbiology: Recent Results (from the past 240 hours)  Blood Culture (routine x 2)     Status: None (Preliminary result)   Collection Time: 05/13/23 12:06 PM   Specimen: BLOOD  Result Value Ref Range Status    Specimen Description   Final    BLOOD SITE NOT SPECIFIED Performed at [facility], [address], [place], [place] [address]    Special Requests   Final    BOTTLES DRAWN AEROBIC AND ANAEROBIC Blood Culture results may not be optimal due to an inadequate volume of blood received in culture bottles Performed at [facility], [address] [place], [place] [address]    Culture   Final    NO GROWTH 3 DAYS Performed at [facility], [address], [place], [place] [address]    Report Status PENDING  Incomplete  Blood Culture (routine x 2)     Status: Abnormal   Collection Time: 05/13/23 12:16 PM   Specimen: BLOOD  Result Value Ref Range Status   Specimen Description   Final  BLOOD SITE NOT SPECIFIED Performed at [facility], [address], [place], [place] [address]    Special Requests   Final    BOTTLES DRAWN AEROBIC AND ANAEROBIC Blood Culture results may not be optimal due to an inadequate volume of blood received in culture bottles Performed at [facility], [address], [place], [place] [address]    Culture  Setup Time   Final    GRAM POSITIVE COCCI IN CHAINS ANAEROBIC BOTTLE ONLY CRITICAL RESULT CALLED TO, READ BACK BY AND VERIFIED WITH: PHARMD Sydney D on 032025 @1026  by SM    Culture (A)  Final    ENTEROCOCCUS FAECALIS SUSCEPTIBILITIES TO FOLLOW Performed at [facility], [address], [place], [place] [address]    Report Status 05/16/2023 FINAL  Final   Organism ID, Bacteria ENTEROCOCCUS FAECALIS  Final      Susceptibility   Enterococcus faecalis - MIC*    AMPICILLIN <=2 SENSITIVE Sensitive     VANCOMYCIN 1 SENSITIVE Sensitive     GENTAMICIN SYNERGY SENSITIVE Sensitive     * ENTEROCOCCUS FAECALIS  Blood Culture ID Panel (Reflexed)     Status: Abnormal   Collection Time: 05/13/23 12:16 PM  Result Value Ref Range Status   Enterococcus faecalis DETECTED (A) NOT  DETECTED Final    Comment: CRITICAL RESULT CALLED TO, READ BACK BY AND VERIFIED WITH: PHARMD Sydney D on 032025 @1026  by SM    Enterococcus Faecium NOT DETECTED NOT DETECTED Final   Listeria monocytogenes NOT DETECTED NOT DETECTED Final   Staphylococcus species NOT DETECTED NOT DETECTED Final   Staphylococcus aureus (BCID) NOT DETECTED NOT DETECTED Final   Staphylococcus epidermidis NOT DETECTED NOT DETECTED Final   Staphylococcus lugdunensis NOT DETECTED NOT DETECTED Final   Streptococcus species NOT DETECTED NOT DETECTED Final   Streptococcus agalactiae NOT DETECTED NOT DETECTED Final   Streptococcus pneumoniae NOT DETECTED NOT DETECTED Final   Streptococcus pyogenes NOT DETECTED NOT DETECTED Final   A.calcoaceticus-baumannii NOT DETECTED NOT DETECTED Final   Bacteroides fragilis NOT DETECTED NOT DETECTED Final   Enterobacterales NOT DETECTED NOT DETECTED Final   Enterobacter cloacae complex NOT DETECTED NOT DETECTED Final   Escherichia coli NOT DETECTED NOT DETECTED Final   Klebsiella aerogenes NOT DETECTED NOT DETECTED Final   Klebsiella oxytoca NOT DETECTED NOT DETECTED Final   Klebsiella pneumoniae NOT DETECTED NOT DETECTED Final   Proteus species NOT DETECTED NOT DETECTED Final   Salmonella species NOT DETECTED NOT DETECTED Final   Serratia marcescens NOT DETECTED NOT DETECTED Final   Haemophilus influenzae NOT DETECTED NOT DETECTED Final   Neisseria meningitidis NOT DETECTED NOT DETECTED Final   Pseudomonas aeruginosa NOT DETECTED NOT DETECTED Final   Stenotrophomonas maltophilia NOT DETECTED NOT DETECTED Final   Candida albicans NOT DETECTED NOT DETECTED Final   Candida auris NOT DETECTED NOT DETECTED Final   Candida glabrata NOT DETECTED NOT DETECTED Final   Candida krusei NOT DETECTED NOT DETECTED Final   Candida parapsilosis NOT DETECTED NOT DETECTED Final   Candida tropicalis NOT DETECTED NOT DETECTED Final   Cryptococcus neoformans/gattii NOT DETECTED NOT DETECTED  Final   Vancomycin resistance NOT DETECTED NOT DETECTED Final    Comment: Performed at [facility], [address] [place], [place] [address]  Resp panel by RT-PCR (RSV, Flu A&B, Covid)     Status: None   Collection Time: 05/13/23 12:20 PM   Specimen: Nasal Swab  Result Value Ref Range Status   SARS  Coronavirus 2 by RT PCR NEGATIVE NEGATIVE Final    Comment: (NOTE) SARS-CoV-2 target nucleic acids are NOT DETECTED.  The SARS-CoV-2 RNA is generally detectable in upper respiratory specimens during the acute phase of infection. The lowest concentration of SARS-CoV-2 viral copies this assay can detect is 138 copies/mL. A negative result does not preclude SARS-Cov-2 infection and should not be used as the sole basis for treatment or other patient management decisions. A negative result may occur with  improper specimen collection/handling, submission of specimen other than nasopharyngeal swab, presence of viral mutation(s) within the areas targeted by this assay, and inadequate number of viral copies(<138 copies/mL). A negative result must be combined with clinical observations, patient history, and epidemiological information. The expected result is Negative.  Fact Sheet for Patients:  BloggerCourse.com  Fact Sheet for Healthcare Providers:  SeriousBroker.it  This test is no t yet approved or cleared by the [place] FDA and  has been authorized for detection and/or diagnosis of SARS-CoV-2 by FDA under an Emergency Use Authorization (EUA). This EUA will remain  in effect (meaning this test can be used) for the duration of the COVID-19 declaration under Section 564(b)(1) of the Act, 21 U.S.C.section 360bbb-3(b)(1), unless the authorization is terminated  or revoked sooner.       Influenza A by PCR NEGATIVE NEGATIVE Final   Influenza B by PCR NEGATIVE NEGATIVE Final    Comment: (NOTE) The Xpert Xpress SARS-CoV-2/FLU/RSV  plus assay is intended as an aid in the diagnosis of influenza from Nasopharyngeal swab specimens and should not be used as a sole basis for treatment. Nasal washings and aspirates are unacceptable for Xpert Xpress SARS-CoV-2/FLU/RSV testing.  Fact Sheet for Patients: BloggerCourse.com  Fact Sheet for Healthcare Providers: SeriousBroker.it  This test is not yet approved or cleared by the [place] FDA and has been authorized for detection and/or diagnosis of SARS-CoV-2 by FDA under an Emergency Use Authorization (EUA). This EUA will remain in effect (meaning this test can be used) for the duration of the COVID-19 declaration under Section 564(b)(1) of the Act, 21 U.S.C. section 360bbb-3(b)(1), unless the authorization is terminated or revoked.     Resp Syncytial Virus by PCR NEGATIVE NEGATIVE Final    Comment: (NOTE) Fact Sheet for Patients: BloggerCourse.com  Fact Sheet for Healthcare Providers: SeriousBroker.it  This test is not yet approved or cleared by the [place] FDA and has been authorized for detection and/or diagnosis of SARS-CoV-2 by FDA under an Emergency Use Authorization (EUA). This EUA will remain in effect (meaning this test can be used) for the duration of the COVID-19 declaration under Section 564(b)(1) of the Act, 21 U.S.C. section 360bbb-3(b)(1), unless the authorization is terminated or revoked.  Performed at [facility], [address], [place], [place] [address]   Urine Culture     Status: Abnormal   Collection Time: 05/13/23  3:00 PM   Specimen: Urine, Suprapubic  Result Value Ref Range Status   Specimen Description   Final    URINE, SUPRAPUBIC Performed at [facility], [address], [place], [place] [address]    Special Requests   Final    NONE Reflexed from [id number] Performed at [facility],  [address] [place], [place] [address]    Culture >=100,000 COLONIES/mL ENTEROCOCCUS FAECALIS (A)  Final   Report Status 05/15/2023 FINAL  Final   Organism ID, Bacteria ENTEROCOCCUS FAECALIS (A)  Final      Susceptibility   Enterococcus faecalis -  MIC*    AMPICILLIN <=2 SENSITIVE Sensitive     NITROFURANTOIN <=16 SENSITIVE Sensitive     VANCOMYCIN 1 SENSITIVE Sensitive     * >=100,000 COLONIES/mL ENTEROCOCCUS FAECALIS  Culture, blood (Routine X 2) w Reflex to ID Panel     Status: None (Preliminary result)   Collection Time: 05/15/23  4:10 AM   Specimen: BLOOD  Result Value Ref Range Status   Specimen Description   Final    BLOOD RIGHT ANTECUBITAL Performed at [facility], [address], [place], [place] [address]    Special Requests   Final    BOTTLES DRAWN AEROBIC AND ANAEROBIC Blood Culture results may not be optimal due to an inadequate volume of blood received in culture bottles Performed at [facility], [address], [place], [place] [address]    Culture   Final    NO GROWTH < 24 HOURS Performed at [facility], [address], [place], [place] [address]    Report Status PENDING  Incomplete  Culture, blood (Routine X 2) w Reflex to ID Panel     Status: None (Preliminary result)   Collection Time: 05/15/23  4:10 AM   Specimen: BLOOD RIGHT FOREARM  Result Value Ref Range Status   Specimen Description   Final    BLOOD RIGHT FOREARM Performed at [facility], [address] [place], [place] [address]    Special Requests   Final    BOTTLES DRAWN AEROBIC AND ANAEROBIC Blood Culture results may not be optimal due to an inadequate volume of blood received in culture bottles Performed at [facility], [address], [place], [place] [address]    Culture   Final    NO GROWTH < 24 HOURS Performed at [facility], [address] [place], [place] [address]    Report Status PENDING   Incomplete    Radiology Studies: ECHOCARDIOGRAM COMPLETE Result Date: 05/15/2023    ECHOCARDIOGRAM REPORT   Patient Name:   Andre Casey. Date of Exam: 05/15/2023 Medical Rec #:  [id number]                Height:       67.0 in Accession #:    [id number]               Weight:       121.3 lb Date of Birth:  August 03, 1929                BSA:          1.635 m Patient Age:    89 years                 BP:           115/54 mmHg Patient Gender: M                        HR:           77 bpm. Exam Location:  Inpatient Procedure: 2D Echo, Cardiac Doppler, Color Doppler and Intracardiac            Opacification Agent (Both Spectral and Color Flow Doppler were            utilized during procedure). Indications:    Bacteremia  History:        Patient has prior history of Echocardiogram examinations, most  recent 01/26/2023. CAD, Arrythmias:LBBB; Risk                 Factors:Hypertension and Dyslipidemia.  Sonographer:    Vern Claude Referring Phys: [id number] CORNELIUS N VAN DAM  Sonographer Comments: Technically difficult study due to poor echo windows. Image acquisition challenging due to respiratory motion. IMPRESSIONS  1. Very technically difficult study. Unable to evaluate valves for endocarditis.  2. Left ventricular ejection fraction, by estimation, is 65 to 70%. Left ventricular ejection fraction by PLAX is 67 %. The left ventricle has normal function. The left ventricle has no regional wall motion abnormalities. Left ventricular diastolic parameters are consistent with Grade I diastolic dysfunction (impaired relaxation).  3. Right ventricular systolic function was not well visualized. The right ventricular size is not well visualized. There is normal pulmonary artery systolic pressure. The estimated right ventricular systolic pressure is 32.8 mmHg.  4. The mitral valve was not well visualized. No evidence of mitral valve regurgitation.  5. The aortic valve was not well visualized. Aortic valve  regurgitation is not visualized. Comparison(s): Changes from prior study are noted. 01/26/2023: LVEF 60-65%. Conclusion(s)/Recommendation(s): If suspicion for endocarditis is high, consider TEE, as this study could now allow evaluation of the valves for endocarditis. FINDINGS  Left Ventricle: Left ventricular ejection fraction, by estimation, is 65 to 70%. Left ventricular ejection fraction by PLAX is 67 %. The left ventricle has normal function. The left ventricle has no regional wall motion abnormalities. Definity contrast agent was given IV to delineate the left ventricular endocardial borders. The left ventricular internal cavity size was normal in size. There is no left ventricular hypertrophy. Abnormal (paradoxical) septal motion, consistent with left bundle branch block. Left ventricular diastolic parameters are consistent with Grade I diastolic dysfunction (impaired relaxation). Indeterminate filling pressures. Right Ventricle: The right ventricular size is not well visualized. Right vetricular wall thickness was not well visualized. Right ventricular systolic function was not well visualized. There is normal pulmonary artery systolic pressure. The tricuspid regurgitant velocity is 2.73 m/s, and with an assumed right atrial pressure of 3 mmHg, the estimated right ventricular systolic pressure is 32.8 mmHg. Left Atrium: Left atrial size was not well visualized. Right Atrium: Right atrial size was not well visualized. Pericardium: The pericardium was not well visualized. Mitral Valve: The mitral valve was not well visualized. No evidence of mitral valve regurgitation. MV peak gradient, 4.1 mmHg. The mean mitral valve gradient is 1.0 mmHg. Tricuspid Valve: The tricuspid valve is not well visualized. Tricuspid valve regurgitation is trivial. Aortic Valve: The aortic valve was not well visualized. Aortic valve regurgitation is not visualized. Aortic valve mean gradient measures 4.0 mmHg. Aortic valve peak gradient  measures 7.3 mmHg. Aortic valve area, by VTI measures 2.35 cm. Pulmonic Valve: The pulmonic valve was not well visualized. Pulmonic valve regurgitation is not visualized. Aorta: The aortic root was not well visualized. IAS/Shunts: The interatrial septum was not assessed.  LEFT VENTRICLE PLAX 2D LV EF:         Left            Diastology                ventricular     LV e' medial:    5.44 cm/s                ejection        LV E/e' medial:  8.8  fraction by     LV e' lateral:   8.95 cm/s                PLAX is 67      LV E/e' lateral: 5.4                %. LVIDd:         3.60 cm LVIDs:         2.30 cm LV PW:         0.70 cm LV IVS:        0.80 cm LVOT diam:     2.00 cm LV SV:         50 LV SV Index:   31 LVOT Area:     3.14 cm  LV Volumes (MOD) LV vol d, MOD    110.0 ml A4C: LV vol s, MOD    20.6 ml A4C: LV SV MOD A4C:   110.0 ml RIGHT VENTRICLE RV Basal diam:  3.10 cm RV Mid diam:    2.50 cm LEFT ATRIUM             Index        RIGHT ATRIUM          Index LA diam:        3.20 cm 1.96 cm/m   RA Area:     7.00 cm LA Vol (A2C):   31.2 ml 19.08 ml/m  RA Volume:   9.81 ml  6.00 ml/m LA Vol (A4C):   31.9 ml 19.51 ml/m LA Biplane Vol: 31.9 ml 19.51 ml/m  AORTIC VALVE                    PULMONIC VALVE AV Area (Vmax):    2.44 cm     PV Vmax:       0.79 m/s AV Area (Vmean):   2.59 cm     PV Peak grad:  2.5 mmHg AV Area (VTI):     2.35 cm AV Vmax:           135.00 cm/s AV Vmean:          86.600 cm/s AV VTI:            0.214 m AV Peak Grad:      7.3 mmHg AV Mean Grad:      4.0 mmHg LVOT Vmax:         105.00 cm/s LVOT Vmean:        71.300 cm/s LVOT VTI:          0.160 m LVOT/AV VTI ratio: 0.75  AORTA Ao Root diam: 3.10 cm Ao Asc diam:  3.00 cm MITRAL VALVE               TRICUSPID VALVE MV Area (PHT): 3.93 cm    TR Peak grad:   29.8 mmHg MV Area VTI:   2.63 cm    TR Vmax:        273.00 cm/s MV Peak grad:  4.1 mmHg MV Mean grad:  1.0 mmHg    SHUNTS MV Vmax:       1.01 m/s    Systemic VTI:  0.16 m MV  Vmean:      55.4 cm/s   Systemic Diam: 2.00 cm MV Decel Time: 193 msec MV E velocity: 48.00 cm/s MV A velocity: 96.70 cm/s MV E/A ratio:  0.50 Zoila Shutter MD Electronically signed by Zoila Shutter MD Signature Date/Time: 05/15/2023/4:14:49 PM  Final     Scheduled Meds:  acidophilus  1 capsule Oral Q lunch   amLODipine  10 mg Oral QHS   aspirin EC  81 mg Oral Daily   Chlorhexidine Gluconate Cloth  6 each Topical Daily   docusate sodium  100 mg Oral BID   heparin  5,000 Units Subcutaneous Q8H   ipratropium-albuterol  3 mL Nebulization BID   memantine  10 mg Oral BID   mirtazapine  15 mg Oral QHS   potassium chloride  20 mEq Oral BID   sodium chloride flush  10-40 mL Intracatheter Q12H   Continuous Infusions:  ampicillin (OMNIPEN) IV 2 g (05/16/23 0555)     LOS: 3 days   35 minutes with more than 50% spent in reviewing records, counseling patient/family and coordinating care.  Tobey Grim, MD [facility] [email or web address] 05/16/2023, 2:18 PM

## 2023-05-16 NOTE — Plan of Care (Signed)
  Problem: Health Behavior/Discharge Planning: Goal: Ability to manage health-related needs will improve Outcome: Progressing   Problem: Pain Managment: Goal: General experience of comfort will improve and/or be controlled Outcome: Progressing   Problem: Skin Integrity: Goal: Risk for impaired skin integrity will decrease Outcome: Progressing

## 2023-05-16 NOTE — Progress Notes (Signed)
 Mobility Specialist - Progress Note   05/16/23 1121  Mobility  Activity Ambulated with assistance in hallway  Level of Assistance Modified independent, requires aide device or extra time  Assistive Device Front wheel walker  Distance Ambulated (ft) 350 ft  Activity Response Tolerated well  Mobility Referral Yes  Mobility visit 1 Mobility  Mobility Specialist Start Time (ACUTE ONLY) 1108  Mobility Specialist Stop Time (ACUTE ONLY) 1121  Mobility Specialist Time Calculation (min) (ACUTE ONLY) 13 min   Pt received in bed and agreeable to mobility. No complaints during session. Upon returning to room, pt c/o SOB. No other complaints during session. Pt to bed after session with all needs met.    [facility]

## 2023-05-17 DIAGNOSIS — B952 Enterococcus as the cause of diseases classified elsewhere: Secondary | ICD-10-CM | POA: Diagnosis not present

## 2023-05-17 DIAGNOSIS — R7881 Bacteremia: Secondary | ICD-10-CM | POA: Diagnosis not present

## 2023-05-17 LAB — BASIC METABOLIC PANEL
Anion gap: 9 (ref 5–15)
BUN: 21 mg/dL (ref 8–23)
CO2: 20 mmol/L — ABNORMAL LOW (ref 22–32)
Calcium: 8.5 mg/dL — ABNORMAL LOW (ref 8.9–10.3)
Chloride: 109 mmol/L (ref 98–111)
Creatinine, Ser: 1.15 mg/dL (ref 0.61–1.24)
GFR, Estimated: >60 mL/min (ref >60)
Glucose, Bld: 112 mg/dL — ABNORMAL HIGH (ref 70–99)
Potassium: 4 mmol/L (ref 3.5–5.1)
Sodium: 138 mmol/L (ref 135–145)

## 2023-05-17 LAB — CBC
HCT: 32.3 % — ABNORMAL LOW (ref 39.0–52.0)
Hemoglobin: 9.8 g/dL — ABNORMAL LOW (ref 13.0–17.0)
MCH: 28.2 pg (ref 26.0–34.0)
MCHC: 30.3 g/dL (ref 30.0–36.0)
MCV: 92.8 fL (ref 80.0–100.0)
Platelets: 283 10*3/uL (ref 150–400)
RBC: 3.48 MIL/uL — ABNORMAL LOW (ref 4.22–5.81)
RDW: 13.8 % (ref 11.5–15.5)
WBC: 6.2 10*3/uL (ref 4.0–10.5)
nRBC: 0 % (ref 0.0–0.2)

## 2023-05-17 NOTE — Progress Notes (Signed)
 PROGRESS NOTE  Andre Casey.  ZHY:[id number] DOB: [birth year] DOA: 05/13/2023 PCP: Lupita Raider, MD  Consultants  Brief Narrative:  89 y.o. male PMH significant for hypertension, hypercholesterolemia, dementia, CAD, asthma, chronic suprapubic catheter, history of prostate cancer presented in the ED with complaints of fever and foul-smelling urine for last 2 days.  Patient has suprapubic catheter and which has been changed every month.  Patient also reports having some shortness of breath associated with fever and cough.  Patient denies any abdominal pain, nausea, vomiting and recent travel or sick contacts. Came to ED with same and admitted, found to have enterococcal UTI with positive blood cultures for the same.    Assessment & Plan: Sepsis secondary to UTI,  Enterococcus faecalis bacteremia  Sepsis has resolved.   Repeat blood cx's pending. Echo with normal EF but valves unable to be visualized.   Suprapubic catheter was exchanged in the ED on 05/13/2023 ID consulted, appreciate input.  Abx narrowed to ampicillin.  Await repeat blood cx results, continue IV abx in meantime.   Await ID rec's for length of IV abx after repeat blood cx results    # History of prostate cancer with resultant suprapubic catheter: Patient follows with urology and suprapubic catheter is changed every month. Suprapubic catheter was changed in the ER on 03/19 Will resume monthly changes on DC.    # AKI on CKD stage IIIb: - Resolved, below baseline creatinine.  Anemia: - Baseline of normocytic anemia, b/n 9-10 going back to at least 2023 - elevated here to 11, up to 13 on admit.  Most likely hemoconcentration.  No blood loss - Normocytic anemia secondary to prostate CA/anemia of chronic disease   Essential hypertension: Continue amlodipine 10 mg daily   Physical deconditioning: Recommending home health PT/OT at discharge.  Pt lives alone.   Dementia/Depression At his baseline.   Forgetful. Continue Namenda 10 mg daily Continue Remeron 15 mg daily   Asthma: Not acutely exacerbated Continue home treatment, which is just rescue nebulizer.  Using 1-2 x daily.  Would likely benefit from long-acting, but will defer to PCP in this 89 yo patient.    DVT prophylaxis:  heparin injection 5,000 Units Start: 05/13/23 2200  Code Status:   Code Status: Limited: Do not attempt resuscitation (DNR) -DNR-LIMITED -Do Not Intubate/DNI  Level of care: Med-Surg Status is: Inpatient awaiting repeat blood cultures Dispo:  Home w/ HHPT when medically ready   Subjective: Patient feels well today, reports memory is not good.  No complaints today, no issues with Foley.  No fevers or chills  Objective: Vitals:   05/16/23 1253 05/16/23 1942 05/16/23 1954 05/17/23 0453  BP: 130/69 (!) 140/77  136/71  Pulse: 78 74  (!) 51  Resp: (!) 22 20  20   Temp: 97.8 F (36.6 C) 98.3 F (36.8 C)  98 F (36.7 C)  TempSrc: Axillary Oral  Oral  SpO2: 99% 98% 96% 98%  Weight:      Height:        Intake/Output Summary (Last 24 hours) at 05/17/2023 0742 Last data filed at 05/17/2023 0650 Gross per 24 hour  Intake --  Output 1350 ml  Net -1350 ml   Filed Weights   05/13/23 1100  Weight: 55 kg   Body mass index is 18.99 kg/m.  Gen: 89 y.o. male in no apparent distress.  Nontoxic Pulm: Non-labored breathing.  Clear to auscultation bilaterally.  CV: Regular rate and rhythm. No murmur, rub, or gallop. No JVD GI:  Abdomen soft, non-tender, non-distended, with normoactive bowel sounds. No organomegaly or masses felt. Ext: Warm, no deformities, no pedal edema Skin: No rashes, lesions noulcers Neuro: Alert and oriented. No focal neurological deficits. Psych: Calm  Judgement and insight appear normal. Mood & affect appropriate.     I have personally reviewed the following labs and images: CBC: Recent Labs  Lab 05/13/23 1206 05/13/23 1751 05/14/23 0436 05/15/23 0410 05/17/23 0430  WBC  18.5* 15.5* 12.0* 7.9 6.2  NEUTROABS 14.8*  --   --   --   --   HGB 13.1 11.9* 11.4* 11.0* 9.8*  HCT 41.4 37.4* 36.5* 35.3* 32.3*  MCV 90.0 89.0 90.1 90.3 92.8  PLT 284 254 234 262 283   BMP &GFR Recent Labs  Lab 05/13/23 1206 05/13/23 1751 05/14/23 0436 05/15/23 0410 05/17/23 0430  NA 134*  --  132* 137 138  K 3.5  --  3.3* 3.5 4.0  CL 98  --  100 108 109  CO2 21*  --  22 19* 20*  GLUCOSE 117*  --  109* 94 112*  BUN 21  --  24* 21 21  CREATININE 1.63* 1.64* 1.58* 1.42* 1.15  CALCIUM 9.1  --  8.3* 8.3* 8.5*  MG  --   --  2.3 2.2  --   PHOS  --   --  2.9 2.1*  --    Estimated Creatinine Clearance: 32.5 mL/min (by C-G formula based on SCr of 1.15 mg/dL). Liver & Pancreas: Recent Labs  Lab 05/13/23 1206 05/14/23 0436  AST 22 17  ALT 13 10  ALKPHOS 79 62  BILITOT 0.8 0.9  PROT 7.8 6.5  ALBUMIN 3.7 2.8*   No results for input(s): "LIPASE", "AMYLASE" in the last 168 hours. No results for input(s): "AMMONIA" in the last 168 hours. Diabetic: No results for input(s): "HGBA1C" in the last 72 hours. Recent Labs  Lab 05/15/23 1212 05/15/23 1754 05/15/23 2022 05/16/23 0741 05/16/23 1138  GLUCAP 107* 90 137* 96 177*   Cardiac Enzymes: No results for input(s): "CKTOTAL", "CKMB", "CKMBINDEX", "TROPONINI" in the last 168 hours. No results for input(s): "PROBNP" in the last 8760 hours. Coagulation Profile: Recent Labs  Lab 05/13/23 1206  INR 1.2   Thyroid Function Tests: No results for input(s): "TSH", "T4TOTAL", "FREET4", "T3FREE", "THYROIDAB" in the last 72 hours. Lipid Profile: No results for input(s): "CHOL", "HDL", "LDLCALC", "TRIG", "CHOLHDL", "LDLDIRECT" in the last 72 hours. Anemia Panel: No results for input(s): "VITAMINB12", "FOLATE", "FERRITIN", "TIBC", "IRON", "RETICCTPCT" in the last 72 hours. Urine analysis:    Component Value Date/Time   COLORURINE STRAW (A) 05/13/2023 1500   APPEARANCEUR CLEAR 05/13/2023 1500   LABSPEC 1.010 05/13/2023 1500    PHURINE 7.0 05/13/2023 1500   GLUCOSEU NEGATIVE 05/13/2023 1500   HGBUR MODERATE (A) 05/13/2023 1500   BILIRUBINUR NEGATIVE 05/13/2023 1500   KETONESUR NEGATIVE 05/13/2023 1500   PROTEINUR 100 (A) 05/13/2023 1500   UROBILINOGEN 0.2 05/15/2007 1118   NITRITE NEGATIVE 05/13/2023 1500   LEUKOCYTESUR LARGE (A) 05/13/2023 1500   Sepsis Labs: Invalid input(s): "PROCALCITONIN", "LACTICIDVEN"  Microbiology: Recent Results (from the past 240 hours)  Blood Culture (routine x 2)     Status: None (Preliminary result)   Collection Time: 05/13/23 12:06 PM   Specimen: BLOOD  Result Value Ref Range Status   Specimen Description   Final    BLOOD SITE NOT SPECIFIED Performed at [facility], [address], [place], [place] [address]    Special Requests  Final    BOTTLES DRAWN AEROBIC AND ANAEROBIC Blood Culture results may not be optimal due to an inadequate volume of blood received in culture bottles Performed at [facility], [address] [place], [place] [address]    Culture   Final    NO GROWTH 3 DAYS Performed at [facility], [address] [place], [place] [address]    Report Status PENDING  Incomplete  Blood Culture (routine x 2)     Status: Abnormal   Collection Time: 05/13/23 12:16 PM   Specimen: BLOOD  Result Value Ref Range Status   Specimen Description   Final    BLOOD SITE NOT SPECIFIED Performed at [facility], [address], [place], [place] [address]    Special Requests   Final    BOTTLES DRAWN AEROBIC AND ANAEROBIC Blood Culture results may not be optimal due to an inadequate volume of blood received in culture bottles Performed at [facility], [address], [place], [place] [address]    Culture  Setup Time   Final    GRAM POSITIVE COCCI IN CHAINS ANAEROBIC BOTTLE ONLY CRITICAL RESULT CALLED TO, READ BACK BY AND VERIFIED WITH: PHARMD Antionette Poles on [id number] @1026  by SM    Culture  (A)  Final    ENTEROCOCCUS FAECALIS SUSCEPTIBILITIES TO FOLLOW Performed at [facility], [address] [place], [place] [address]    Report Status 05/16/2023 FINAL  Final   Organism ID, Bacteria ENTEROCOCCUS FAECALIS  Final      Susceptibility   Enterococcus faecalis - MIC*    AMPICILLIN <=2 SENSITIVE Sensitive     VANCOMYCIN 1 SENSITIVE Sensitive     GENTAMICIN SYNERGY SENSITIVE Sensitive     * ENTEROCOCCUS FAECALIS  Blood Culture ID Panel (Reflexed)     Status: Abnormal   Collection Time: 05/13/23 12:16 PM  Result Value Ref Range Status   Enterococcus faecalis DETECTED (A) NOT DETECTED Final    Comment: CRITICAL RESULT CALLED TO, READ BACK BY AND VERIFIED WITH: PHARMD Sydney D on [id number] @1026  by SM    Enterococcus Faecium NOT DETECTED NOT DETECTED Final   Listeria monocytogenes NOT DETECTED NOT DETECTED Final   Staphylococcus species NOT DETECTED NOT DETECTED Final   Staphylococcus aureus (BCID) NOT DETECTED NOT DETECTED Final   Staphylococcus epidermidis NOT DETECTED NOT DETECTED Final   Staphylococcus lugdunensis NOT DETECTED NOT DETECTED Final   Streptococcus species NOT DETECTED NOT DETECTED Final   Streptococcus agalactiae NOT DETECTED NOT DETECTED Final   Streptococcus pneumoniae NOT DETECTED NOT DETECTED Final   Streptococcus pyogenes NOT DETECTED NOT DETECTED Final   A.calcoaceticus-baumannii NOT DETECTED NOT DETECTED Final   Bacteroides fragilis NOT DETECTED NOT DETECTED Final   Enterobacterales NOT DETECTED NOT DETECTED Final   Enterobacter cloacae complex NOT DETECTED NOT DETECTED Final   Escherichia coli NOT DETECTED NOT DETECTED Final   Klebsiella aerogenes NOT DETECTED NOT DETECTED Final   Klebsiella oxytoca NOT DETECTED NOT DETECTED Final   Klebsiella pneumoniae NOT DETECTED NOT DETECTED Final   Proteus species NOT DETECTED NOT DETECTED Final   Salmonella species NOT DETECTED NOT DETECTED Final   Serratia marcescens NOT DETECTED NOT DETECTED Final    Haemophilus influenzae NOT DETECTED NOT DETECTED Final   Neisseria meningitidis NOT DETECTED NOT DETECTED Final   Pseudomonas aeruginosa NOT DETECTED NOT DETECTED Final   Stenotrophomonas maltophilia NOT DETECTED NOT DETECTED Final   Candida albicans NOT DETECTED NOT DETECTED Final  Candida auris NOT DETECTED NOT DETECTED Final   Candida glabrata NOT DETECTED NOT DETECTED Final   Candida krusei NOT DETECTED NOT DETECTED Final   Candida parapsilosis NOT DETECTED NOT DETECTED Final   Candida tropicalis NOT DETECTED NOT DETECTED Final   Cryptococcus neoformans/gattii NOT DETECTED NOT DETECTED Final   Vancomycin resistance NOT DETECTED NOT DETECTED Final    Comment: Performed at [facility], [address] [place], [place] [address]  Resp panel by RT-PCR (RSV, Flu A&B, Covid)     Status: None   Collection Time: 05/13/23 12:20 PM   Specimen: Nasal Swab  Result Value Ref Range Status   SARS Coronavirus 2 by RT PCR NEGATIVE NEGATIVE Final    Comment: (NOTE) SARS-CoV-2 target nucleic acids are NOT DETECTED.  The SARS-CoV-2 RNA is generally detectable in upper respiratory specimens during the acute phase of infection. The lowest concentration of SARS-CoV-2 viral copies this assay can detect is 138 copies/mL. A negative result does not preclude SARS-Cov-2 infection and should not be used as the sole basis for treatment or other patient management decisions. A negative result may occur with  improper specimen collection/handling, submission of specimen other than nasopharyngeal swab, presence of viral mutation(s) within the areas targeted by this assay, and inadequate number of viral copies(<138 copies/mL). A negative result must be combined with clinical observations, patient history, and epidemiological information. The expected result is Negative.  Fact Sheet for Patients:  BloggerCourse.com  Fact Sheet for Healthcare Providers:   SeriousBroker.it  This test is no t yet approved or cleared by the [place] FDA and  has been authorized for detection and/or diagnosis of SARS-CoV-2 by FDA under an Emergency Use Authorization (EUA). This EUA will remain  in effect (meaning this test can be used) for the duration of the COVID-19 declaration under Section 564(b)(1) of the Act, 21 U.S.C.section 360bbb-3(b)(1), unless the authorization is terminated  or revoked sooner.       Influenza A by PCR NEGATIVE NEGATIVE Final   Influenza B by PCR NEGATIVE NEGATIVE Final    Comment: (NOTE) The Xpert Xpress SARS-CoV-2/FLU/RSV plus assay is intended as an aid in the diagnosis of influenza from Nasopharyngeal swab specimens and should not be used as a sole basis for treatment. Nasal washings and aspirates are unacceptable for Xpert Xpress SARS-CoV-2/FLU/RSV testing.  Fact Sheet for Patients: BloggerCourse.com  Fact Sheet for Healthcare Providers: SeriousBroker.it  This test is not yet approved or cleared by the [place] FDA and has been authorized for detection and/or diagnosis of SARS-CoV-2 by FDA under an Emergency Use Authorization (EUA). This EUA will remain in effect (meaning this test can be used) for the duration of the COVID-19 declaration under Section 564(b)(1) of the Act, 21 U.S.C. section 360bbb-3(b)(1), unless the authorization is terminated or revoked.     Resp Syncytial Virus by PCR NEGATIVE NEGATIVE Final    Comment: (NOTE) Fact Sheet for Patients: BloggerCourse.com  Fact Sheet for Healthcare Providers: SeriousBroker.it  This test is not yet approved or cleared by the [place] FDA and has been authorized for detection and/or diagnosis of SARS-CoV-2 by FDA under an Emergency Use Authorization (EUA). This EUA will remain in effect (meaning this test can be used) for  the duration of the COVID-19 declaration under Section 564(b)(1) of the Act, 21 U.S.C. section 360bbb-3(b)(1), unless the authorization is terminated or revoked.  Performed at [facility], [address], [place], [place] [address]   Urine Culture  Status: Abnormal   Collection Time: 05/13/23  3:00 PM   Specimen: Urine, Suprapubic  Result Value Ref Range Status   Specimen Description   Final    URINE, SUPRAPUBIC Performed at [facility], [address], [place], [place] [address]    Special Requests   Final    NONE Reflexed from [id number] Performed at [facility], [address] [place], [place] [address]    Culture >=100,000 COLONIES/mL ENTEROCOCCUS FAECALIS (A)  Final   Report Status 05/15/2023 FINAL  Final   Organism ID, Bacteria ENTEROCOCCUS FAECALIS (A)  Final      Susceptibility   Enterococcus faecalis - MIC*    AMPICILLIN <=2 SENSITIVE Sensitive     NITROFURANTOIN <=16 SENSITIVE Sensitive     VANCOMYCIN 1 SENSITIVE Sensitive     * >=100,000 COLONIES/mL ENTEROCOCCUS FAECALIS  Culture, blood (Routine X 2) w Reflex to ID Panel     Status: None (Preliminary result)   Collection Time: 05/15/23  4:10 AM   Specimen: BLOOD  Result Value Ref Range Status   Specimen Description   Final    BLOOD RIGHT ANTECUBITAL Performed at [facility], [address] [place], [place] [address]    Special Requests   Final    BOTTLES DRAWN AEROBIC AND ANAEROBIC Blood Culture results may not be optimal due to an inadequate volume of blood received in culture bottles Performed at [facility], [address] [place], [place] [address]    Culture   Final    NO GROWTH < 24 HOURS Performed at [facility], [address] [place], [place] [address]    Report Status PENDING  Incomplete  Culture, blood (Routine X 2) w Reflex to ID Panel     Status: None (Preliminary result)   Collection Time: 05/15/23   4:10 AM   Specimen: BLOOD RIGHT FOREARM  Result Value Ref Range Status   Specimen Description   Final    BLOOD RIGHT FOREARM Performed at [facility], [address] [place], [place] [address]    Special Requests   Final    BOTTLES DRAWN AEROBIC AND ANAEROBIC Blood Culture results may not be optimal due to an inadequate volume of blood received in culture bottles Performed at [facility], [address], [place], [place] [address]    Culture   Final    NO GROWTH < 24 HOURS Performed at [facility], [address], [place], [place] [address]    Report Status PENDING  Incomplete    Radiology Studies: No results found.   Scheduled Meds:  acidophilus  1 capsule Oral Q lunch   amLODipine  10 mg Oral QHS   aspirin EC  81 mg Oral Daily   Chlorhexidine Gluconate Cloth  6 each Topical Daily   docusate sodium  100 mg Oral BID   heparin  5,000 Units Subcutaneous Q8H   ipratropium-albuterol  3 mL Nebulization BID   memantine  10 mg Oral BID   mirtazapine  15 mg Oral QHS   potassium chloride  20 mEq Oral BID   sodium chloride flush  10-40 mL Intracatheter Q12H   Continuous Infusions:  ampicillin (OMNIPEN) IV 2 g (05/17/23 0420)     LOS: 4 days   35 minutes with more than 50% spent in reviewing records, counseling patient/family and coordinating care.  Tobey Grim, MD [facility] [email or web address] 05/17/2023, 7:42 AM

## 2023-05-17 NOTE — Progress Notes (Signed)
 Mobility Specialist - Progress Note   05/17/23 1124  Mobility  Activity Ambulated with assistance in hallway;Ambulated with assistance to bathroom  Level of Assistance Standby assist, set-up cues, supervision of patient - no hands on  Assistive Device Front wheel walker  Distance Ambulated (ft) 80 ft  Activity Response Tolerated well  Mobility Referral Yes  Mobility visit 1 Mobility  Mobility Specialist Start Time (ACUTE ONLY) 1109  Mobility Specialist Stop Time (ACUTE ONLY) 1121  Mobility Specialist Time Calculation (min) (ACUTE ONLY) 12 min   Pt received in recliner and agreeable to mobility. Upon returning to room, pt requested assistance to the bathroom for BM. BM unsuccessful. No complaints during session. Pt to bed after session with all needs met.    [facility]

## 2023-05-17 NOTE — Plan of Care (Signed)
   Problem: Activity: Goal: Risk for activity intolerance will decrease Outcome: Progressing   Problem: Nutrition: Goal: Adequate nutrition will be maintained Outcome: Progressing   Problem: Coping: Goal: Level of anxiety will decrease Outcome: Progressing   Problem: Elimination: Goal: Will not experience complications related to bowel motility Outcome: Progressing Goal: Will not experience complications related to urinary retention Outcome: Progressing

## 2023-05-18 DIAGNOSIS — R7881 Bacteremia: Secondary | ICD-10-CM | POA: Diagnosis not present

## 2023-05-18 DIAGNOSIS — N1831 Chronic kidney disease, stage 3a: Secondary | ICD-10-CM | POA: Diagnosis not present

## 2023-05-18 DIAGNOSIS — N179 Acute kidney failure, unspecified: Secondary | ICD-10-CM | POA: Diagnosis not present

## 2023-05-18 DIAGNOSIS — B952 Enterococcus as the cause of diseases classified elsewhere: Secondary | ICD-10-CM | POA: Diagnosis not present

## 2023-05-18 LAB — BASIC METABOLIC PANEL
Anion gap: 11 (ref 5–15)
BUN: 16 mg/dL (ref 8–23)
CO2: 21 mmol/L — ABNORMAL LOW (ref 22–32)
Calcium: 9.1 mg/dL (ref 8.9–10.3)
Chloride: 108 mmol/L (ref 98–111)
Creatinine, Ser: 1.37 mg/dL — ABNORMAL HIGH (ref 0.61–1.24)
GFR, Estimated: 49 mL/min — ABNORMAL LOW (ref >60)
Glucose, Bld: 182 mg/dL — ABNORMAL HIGH (ref 70–99)
Potassium: 3.7 mmol/L (ref 3.5–5.1)
Sodium: 140 mmol/L (ref 135–145)

## 2023-05-18 LAB — CULTURE, BLOOD (ROUTINE X 2): Culture: NO GROWTH

## 2023-05-18 LAB — GLUCOSE, CAPILLARY: Glucose-Capillary: 100 mg/dL — ABNORMAL HIGH (ref 70–99)

## 2023-05-18 LAB — CBC
HCT: 39.6 % (ref 39.0–52.0)
Hemoglobin: 12.1 g/dL — ABNORMAL LOW (ref 13.0–17.0)
MCH: 27.8 pg (ref 26.0–34.0)
MCHC: 30.6 g/dL (ref 30.0–36.0)
MCV: 91 fL (ref 80.0–100.0)
Platelets: 452 10*3/uL — ABNORMAL HIGH (ref 150–400)
RBC: 4.35 MIL/uL (ref 4.22–5.81)
RDW: 13.6 % (ref 11.5–15.5)
WBC: 8.2 10*3/uL (ref 4.0–10.5)
nRBC: 0 % (ref 0.0–0.2)

## 2023-05-18 MED ORDER — IPRATROPIUM-ALBUTEROL 0.5-2.5 (3) MG/3ML IN SOLN: 3.0000 mL | Freq: Four times a day (QID) | RESPIRATORY_TRACT | Status: DC | PRN

## 2023-05-18 MED ORDER — AMOXICILLIN 500 MG PO CAPS
1000.0000 mg | ORAL_CAPSULE | Freq: Two times a day (BID) | ORAL | Status: DC
  Administered 2023-05-18 – 2023-05-19 (×3): 1000 mg via ORAL
  Filled 2023-05-18 (×3): qty 2

## 2023-05-18 NOTE — Progress Notes (Signed)
 Physical Therapy Treatment Patient Details Name: Andre Casey. MRN: [id number] DOB: 1929-09-17 Today's Date: 05/18/2023   History of Present Illness Andre Casey. is a 89 y.o. male admitted with enterococcus faecalis infection. PMH: HTN, hypercholesterolemia, dementia, CAD, asthma, prostate CA and chronic suprapubic catheter    PT Comments  Pt supine in bed upon arrival, agreeable to therapy. Pt amb 200 ft with RW, CGA to supv for safety, increased time with turns and to navigate around obstacles. Return to room due to lunch tray arrival, all needs in reach. Continue to recommend HHPT with family support at d/c.   If plan is discharge home, recommend the following: Assistance with cooking/housework;Assist for transportation   Can travel by private vehicle        Equipment Recommendations  None recommended by PT    Recommendations for Other Services       Precautions / Restrictions Precautions Precautions: Fall Restrictions Weight Bearing Restrictions Per Provider Order: No     Mobility  Bed Mobility Overal bed mobility: Modified Independent Bed Mobility: Supine to Sit                Transfers Overall transfer level: Needs assistance Equipment used: Rolling walker (2 wheels) Transfers: Sit to/from Stand, Bed to chair/wheelchair/BSC Sit to Stand: Contact guard assist           General transfer comment: verbal cues for hand placement to power up, slow to rise, BLE braced against bed, maitnains forward flexed trunk in standing    Ambulation/Gait Ambulation/Gait assistance: Contact guard assist, Supervision Gait Distance (Feet): 200 Feet Assistive device: Rolling walker (2 wheels) Gait Pattern/deviations: Step-through pattern, Decreased stride length, Trunk flexed Gait velocity: decreased     General Gait Details: step through gait pattern with minimal bil foot clearance, increased time with turns and navigating around obstacles, no overt  LOB, requesting to return to room for lunch tray   Stairs             Wheelchair Mobility     Tilt Bed    Modified Rankin (Stroke Patients Only)       Balance Overall balance assessment: Needs assistance Sitting-balance support: Feet supported Sitting balance-Leahy Scale: Good     Standing balance support: During functional activity, Reliant on assistive device for balance, Bilateral upper extremity supported Standing balance-Leahy Scale: Poor                              Communication Communication Communication: No apparent difficulties  Cognition Arousal: Alert Behavior During Therapy: WFL for tasks assessed/performed   PT - Cognitive impairments: History of cognitive impairments                       PT - Cognition Comments: pt pleasant, follows commands Following commands: Intact      Cueing Cueing Techniques: Verbal cues  Exercises      General Comments        Pertinent Vitals/Pain Pain Assessment Pain Assessment: No/denies pain    Home Living                          Prior Function            PT Goals (current goals can now be found in the care plan section) Acute Rehab PT Goals Patient Stated Goal: "let's go for a walk" PT Goal Formulation: With patient  Time For Goal Achievement: 05/29/23 Potential to Achieve Goals: Good Progress towards PT goals: Progressing toward goals    Frequency    Min 2X/week      PT Plan      Co-evaluation              AM-PAC PT "6 Clicks" Mobility   Outcome Measure  Help needed turning from your back to your side while in a flat bed without using bedrails?: A Little Help needed moving from lying on your back to sitting on the side of a flat bed without using bedrails?: A Little Help needed moving to and from a bed to a chair (including a wheelchair)?: A Little Help needed standing up from a chair using your arms (e.g., wheelchair or bedside chair)?: A  Little Help needed to walk in hospital room?: A Little Help needed climbing 3-5 steps with a railing? : A Little 6 Click Score: 18    End of Session Equipment Utilized During Treatment: Gait belt Activity Tolerance: Patient tolerated treatment well Patient left: in chair;with call bell/phone within reach;with chair alarm set Nurse Communication: Mobility status PT Visit Diagnosis: Other abnormalities of gait and mobility (R26.89)     Time: 7846-9629 PT Time Calculation (min) (ACUTE ONLY): 14 min  Charges:    $Gait Training: 8-22 mins PT General Charges $$ ACUTE PT VISIT: 1 Visit                     Tori Kashayla Ungerer PT, DPT 05/18/23, 1:30 PM

## 2023-05-18 NOTE — Plan of Care (Signed)

## 2023-05-18 NOTE — Progress Notes (Signed)
 Occupational Therapy Treatment Patient Details Name: Andre Casey. MRN: [id number] DOB: Jul 22, 1929 Today's Date: 05/18/2023   History of present illness Andre Casey. is a 89 y.o. male admitted with enterococcus faecalis infection. PMH: HTN, hypercholesterolemia, dementia, CAD, asthma, prostate CA and chronic suprapubic catheter   OT comments  Patient tolerated am skilled OT visit well. Patient open and motivated with all presented activity including RW use and standing tolerance for self care. Patient continues to present with activity tolerance, standing balance and higher level memory deficits impacting A/IADL's and mobility. Patient requires continued Acute OT while in hospital setting and support from family and HHOT services upon discharge.       If plan is discharge home, recommend the following:  A little help with walking and/or transfers;A little help with bathing/dressing/bathroom;Assistance with cooking/housework;Direct supervision/assist for medications management;Direct supervision/assist for financial management;Assist for transportation;Help with stairs or ramp for entrance;Supervision due to cognitive status   Equipment Recommendations  None recommended by OT       Precautions / Restrictions Precautions Precautions: Fall Restrictions Weight Bearing Restrictions Per Provider Order: No       Mobility Bed Mobility Overal bed mobility: Modified Independent Bed Mobility: Supine to Sit                Transfers Overall transfer level: Needs assistance Equipment used: Rolling walker (2 wheels) Transfers: Sit to/from Stand, Bed to chair/wheelchair/BSC Sit to Stand: Contact guard assist                 Balance Overall balance assessment: Needs assistance Sitting-balance support: Feet supported Sitting balance-Leahy Scale: Good     Standing balance support: Single extremity supported, During functional activity Standing balance-Leahy  Scale: Fair                             ADL either performed or assessed with clinical judgement   ADL Overall ADL's : Needs assistance/impaired Eating/Feeding: Independent   Grooming: Oral care;Wash/dry face;Wash/dry hands;Applying deodorant;Brushing hair;Set up;Sitting   Upper Body Bathing: Set up;Sitting   Lower Body Bathing: Contact guard assist   Upper Body Dressing : Set up;Sitting   Lower Body Dressing: Contact guard assist   Toilet Transfer: Contact guard assist Toilet Transfer Details (indicate cue type and reason): lines and catheter management Toileting- Clothing Manipulation and Hygiene: Contact guard assist       Functional mobility during ADLs: Contact guard assist General ADL Comments: improved overall LB ADL performance to CGA    Extremity/Trunk Assessment Upper Extremity Assessment Upper Extremity Assessment: Right hand dominant   Lower Extremity Assessment Lower Extremity Assessment: Overall WFL for tasks assessed        Vision   Vision Assessment?: No apparent visual deficits   Perception Perception Perception: Within Functional Limits   Praxis Praxis Praxis: WFL   Communication Communication Communication: No apparent difficulties   Cognition Arousal: Alert Behavior During Therapy: WFL for tasks assessed/performed                                 Following commands: Intact        Cueing   Cueing Techniques: Verbal cues        General Comments improved overall activity tolerance this session    Pertinent Vitals/ Pain       Pain Assessment Pain Assessment: No/denies pain   Frequency  Min 2X/week  Progress Toward Goals  OT Goals(current goals can now be found in the care plan section)  Progress towards OT goals: Progressing toward goals  Acute Rehab OT Goals Patient Stated Goal: to go home soon OT Goal Formulation: With patient Time For Goal Achievement: 05/29/23 Potential to Achieve  Goals: Good ADL Goals Pt Will Perform Lower Body Bathing: with modified independence;sitting/lateral leans;sit to/from stand Pt Will Perform Lower Body Dressing: with modified independence;sitting/lateral leans;sit to/from stand Pt Will Transfer to Toilet: with modified independence;ambulating;grab bars  Plan         AM-PAC OT "6 Clicks" Daily Activity     Outcome Measure   Help from another person eating meals?: None Help from another person taking care of personal grooming?: None Help from another person toileting, which includes using toliet, bedpan, or urinal?: A Little Help from another person bathing (including washing, rinsing, drying)?: A Little Help from another person to put on and taking off regular upper body clothing?: A Little Help from another person to put on and taking off regular lower body clothing?: A Little 6 Click Score: 20    End of Session Equipment Utilized During Treatment: Gait belt;Rolling walker (2 wheels)  OT Visit Diagnosis: Unsteadiness on feet (R26.81)   Activity Tolerance Patient tolerated treatment well   Patient Left in chair;with call bell/phone within reach;with chair alarm set   Nurse Communication Mobility status;Other (comment) (BM documnented in flowsheets)        Time: 1610-9604 OT Time Calculation (min): 30 min  Charges: OT General Charges $OT Visit: 1 Visit OT Treatments $Self Care/Home Management : 23-37 mins  Jenevieve Kirschbaum OT/L Acute Rehabilitation Department  [phone]  05/18/2023, 9:48 AM

## 2023-05-18 NOTE — Progress Notes (Signed)
 [facility]  Date of Admission:  05/13/2023     Assessment:   Enterococcus faecalis bacteremia with sepsis from urinary source and patient with suprapubic catheter and history of prostate cancer Chronic kidney disease Dementia Prior history of MSSA bacteremia status post 4 weeks of cefazolin   Plan:   Narrowed to ampicillin -- and given continued improvement can transition to amoxicillin Repeat blood cultures ngtd; ucx with e faecalis as well Tte difficult but low suspicion endocarditis and will defer tee Plan 2 weeks total abx until 05/30/23 Ok to discharge from id standpoint once clear by primary team Standard isolation precaution Discussed with primary team    Principal Problem:   Enterococcal bacteremia Active Problems:   Dyslipidemia, goal LDL below 70   Prostate cancer metastatic to multiple sites [facility])   AKI on CKD-3A   Essential hypertension   Physical deconditioning   Sepsis (HCC)   Enterococcus faecalis infection   Allergies  Allergen Reactions   Sertraline Diarrhea   Simvastatin Diarrhea   Aricept [Donepezil] Nausea Only    Report upset stomach - unable to tolerate.   Namzaric [Memantine Hcl-Donepezil Hcl] Other (See Comments)    Stomach upset  Pt able to take Memantine by itself.   Wellbutrin [Bupropion] Anxiety    Scheduled Meds:  acidophilus  1 capsule Oral Q lunch   amLODipine  10 mg Oral QHS   amoxicillin  1,000 mg Oral Q12H   aspirin EC  81 mg Oral Daily   Chlorhexidine Gluconate Cloth  6 each Topical Daily   docusate sodium  100 mg Oral BID   heparin  5,000 Units Subcutaneous Q8H   ipratropium-albuterol  3 mL Nebulization BID   memantine  10 mg Oral BID   mirtazapine  15 mg Oral QHS   potassium chloride  20 mEq Oral BID   sodium chloride flush  10-40 mL Intracatheter Q12H   Continuous Infusions: PRN Meds:.acetaminophen **OR** acetaminophen, ondansetron **OR** ondansetron (ZOFRAN) IV, sodium chloride  flush   SUBJECTIVE: No complaint outside of spc catheter site pain No f/c No n/v/d No rash  Review of Systems: ROS All other ROS was negative, except mentioned above     OBJECTIVE: Vitals:   05/17/23 2044 05/17/23 2054 05/18/23 0420 05/18/23 1050  BP: 127/71  122/63 135/75  Pulse: 70  66 76  Resp: 17  19 16   Temp: 98.2 F (36.8 C)  98.2 F (36.8 C) 98.6 F (37 C)  TempSrc:   Oral   SpO2: 97% 97% 97% 98%  Weight:      Height:       Body mass index is 18.99 kg/m.  Physical Exam General/constitutional: no distress, pleasant, eating lunch; sitting in chair; conversant HEENT: Normocephalic, PER, Conj Clear, EOMI, Oropharynx clear Neck supple CV: rrr no mrg Lungs: clear to auscultation, normal respiratory effort Abd: Soft, Nontender -- spc site no erythema/purulence; slight tenderness Ext: no edema Neuro: nonfocal MSK: no peripheral joint swelling/tenderness/warmth    Lab Results Lab Results  Component Value Date   WBC 8.2 05/18/2023   HGB 12.1 (L) 05/18/2023   HCT 39.6 05/18/2023   MCV 91.0 05/18/2023   PLT 452 (H) 05/18/2023    Lab Results  Component Value Date   CREATININE 1.37 (H) 05/18/2023   BUN 16 05/18/2023   NA 140 05/18/2023   K 3.7 05/18/2023   CL 108 05/18/2023   CO2 21 (L) 05/18/2023    Lab Results  Component Value Date   ALT 10 05/14/2023   AST 17 05/14/2023   ALKPHOS 62 05/14/2023   BILITOT 0.9 05/14/2023      Microbiology: Recent Results (from the past 240 hours)  Blood Culture (routine x 2)     Status: None   Collection Time: 05/13/23 12:06 PM   Specimen: BLOOD  Result Value Ref Range Status   Specimen Description   Final    BLOOD SITE NOT SPECIFIED Performed at [facility], [address], [place], [place] [address]    Special Requests   Final    BOTTLES DRAWN AEROBIC AND ANAEROBIC Blood Culture results may not be optimal due to an inadequate volume of blood received in culture bottles Performed at  [facility], [address], [place], [place] [address]    Culture   Final    NO GROWTH 5 DAYS Performed at [facility], [address], [place], [place] [address]    Report Status 05/18/2023 FINAL  Final  Blood Culture (routine x 2)     Status: Abnormal   Collection Time: 05/13/23 12:16 PM   Specimen: BLOOD  Result Value Ref Range Status   Specimen Description   Final    BLOOD SITE NOT SPECIFIED Performed at [facility], [address] [place], [place] [address]    Special Requests   Final    BOTTLES DRAWN AEROBIC AND ANAEROBIC Blood Culture results may not be optimal due to an inadequate volume of blood received in culture bottles Performed at [facility], [address] [place], [place] [address]    Culture  Setup Time   Final    GRAM POSITIVE COCCI IN CHAINS ANAEROBIC BOTTLE ONLY CRITICAL RESULT CALLED TO, READ BACK BY AND VERIFIED WITH: PHARMD Sydney D on [id number] @1026  by SM    Culture (A)  Final    ENTEROCOCCUS FAECALIS SUSCEPTIBILITIES TO FOLLOW Performed at [facility], [address], [place], [place] [address]    Report Status 05/16/2023 FINAL  Final   Organism ID, Bacteria ENTEROCOCCUS FAECALIS  Final      Susceptibility   Enterococcus faecalis - MIC*    AMPICILLIN <=2 SENSITIVE Sensitive     VANCOMYCIN 1 SENSITIVE Sensitive     GENTAMICIN SYNERGY SENSITIVE Sensitive     * ENTEROCOCCUS FAECALIS  Blood Culture ID Panel (Reflexed)     Status: Abnormal   Collection Time: 05/13/23 12:16 PM  Result Value Ref Range Status   Enterococcus faecalis DETECTED (A) NOT DETECTED Final    Comment: CRITICAL RESULT CALLED TO, READ BACK BY AND VERIFIED WITH: PHARMD Sydney D on [id number] @1026  by SM    Enterococcus Faecium NOT DETECTED NOT DETECTED Final   Listeria monocytogenes NOT DETECTED NOT DETECTED Final   Staphylococcus species NOT DETECTED NOT DETECTED Final   Staphylococcus aureus (BCID) NOT  DETECTED NOT DETECTED Final   Staphylococcus epidermidis NOT DETECTED NOT DETECTED Final   Staphylococcus lugdunensis NOT DETECTED NOT DETECTED Final   Streptococcus species NOT DETECTED NOT DETECTED Final   Streptococcus agalactiae NOT DETECTED NOT DETECTED Final   Streptococcus pneumoniae NOT DETECTED NOT DETECTED Final   Streptococcus pyogenes NOT DETECTED NOT DETECTED Final   A.calcoaceticus-baumannii NOT DETECTED NOT DETECTED Final   Bacteroides fragilis NOT DETECTED NOT DETECTED Final   Enterobacterales NOT DETECTED NOT DETECTED Final   Enterobacter cloacae complex NOT DETECTED NOT DETECTED Final   Escherichia coli NOT DETECTED NOT DETECTED Final  Klebsiella aerogenes NOT DETECTED NOT DETECTED Final   Klebsiella oxytoca NOT DETECTED NOT DETECTED Final   Klebsiella pneumoniae NOT DETECTED NOT DETECTED Final   Proteus species NOT DETECTED NOT DETECTED Final   Salmonella species NOT DETECTED NOT DETECTED Final   Serratia marcescens NOT DETECTED NOT DETECTED Final   Haemophilus influenzae NOT DETECTED NOT DETECTED Final   Neisseria meningitidis NOT DETECTED NOT DETECTED Final   Pseudomonas aeruginosa NOT DETECTED NOT DETECTED Final   Stenotrophomonas maltophilia NOT DETECTED NOT DETECTED Final   Candida albicans NOT DETECTED NOT DETECTED Final   Candida auris NOT DETECTED NOT DETECTED Final   Candida glabrata NOT DETECTED NOT DETECTED Final   Candida krusei NOT DETECTED NOT DETECTED Final   Candida parapsilosis NOT DETECTED NOT DETECTED Final   Candida tropicalis NOT DETECTED NOT DETECTED Final   Cryptococcus neoformans/gattii NOT DETECTED NOT DETECTED Final   Vancomycin resistance NOT DETECTED NOT DETECTED Final    Comment: Performed at [facility], [address], [place], [place] [address]  Resp panel by RT-PCR (RSV, Flu A&B, Covid)     Status: None   Collection Time: 05/13/23 12:20 PM   Specimen: Nasal Swab  Result Value Ref Range Status   SARS Coronavirus 2 by RT PCR  NEGATIVE NEGATIVE Final    Comment: (NOTE) SARS-CoV-2 target nucleic acids are NOT DETECTED.  The SARS-CoV-2 RNA is generally detectable in upper respiratory specimens during the acute phase of infection. The lowest concentration of SARS-CoV-2 viral copies this assay can detect is 138 copies/mL. A negative result does not preclude SARS-Cov-2 infection and should not be used as the sole basis for treatment or other patient management decisions. A negative result may occur with  improper specimen collection/handling, submission of specimen other than nasopharyngeal swab, presence of viral mutation(s) within the areas targeted by this assay, and inadequate number of viral copies(<138 copies/mL). A negative result must be combined with clinical observations, patient history, and epidemiological information. The expected result is Negative.  Fact Sheet for Patients:  BloggerCourse.com  Fact Sheet for Healthcare Providers:  SeriousBroker.it  This test is no t yet approved or cleared by the [place] FDA and  has been authorized for detection and/or diagnosis of SARS-CoV-2 by FDA under an Emergency Use Authorization (EUA). This EUA will remain  in effect (meaning this test can be used) for the duration of the COVID-19 declaration under Section 564(b)(1) of the Act, 21 U.S.C.section 360bbb-3(b)(1), unless the authorization is terminated  or revoked sooner.       Influenza A by PCR NEGATIVE NEGATIVE Final   Influenza B by PCR NEGATIVE NEGATIVE Final    Comment: (NOTE) The Xpert Xpress SARS-CoV-2/FLU/RSV plus assay is intended as an aid in the diagnosis of influenza from Nasopharyngeal swab specimens and should not be used as a sole basis for treatment. Nasal washings and aspirates are unacceptable for Xpert Xpress SARS-CoV-2/FLU/RSV testing.  Fact Sheet for Patients: BloggerCourse.com  Fact Sheet for  Healthcare Providers: SeriousBroker.it  This test is not yet approved or cleared by the [place] FDA and has been authorized for detection and/or diagnosis of SARS-CoV-2 by FDA under an Emergency Use Authorization (EUA). This EUA will remain in effect (meaning this test can be used) for the duration of the COVID-19 declaration under Section 564(b)(1) of the Act, 21 U.S.C. section 360bbb-3(b)(1), unless the authorization is terminated or revoked.     Resp Syncytial Virus by PCR NEGATIVE NEGATIVE Final    Comment: (NOTE) Fact Sheet  for Patients: BloggerCourse.com  Fact Sheet for Healthcare Providers: SeriousBroker.it  This test is not yet approved or cleared by the [place] FDA and has been authorized for detection and/or diagnosis of SARS-CoV-2 by FDA under an Emergency Use Authorization (EUA). This EUA will remain in effect (meaning this test can be used) for the duration of the COVID-19 declaration under Section 564(b)(1) of the Act, 21 U.S.C. section 360bbb-3(b)(1), unless the authorization is terminated or revoked.  Performed at [facility], [address] [place], [place] [address]   Urine Culture     Status: Abnormal   Collection Time: 05/13/23  3:00 PM   Specimen: Urine, Suprapubic  Result Value Ref Range Status   Specimen Description   Final    URINE, SUPRAPUBIC Performed at [facility], [address] [place], [place] [address]    Special Requests   Final    NONE Reflexed from [id number] Performed at [facility], [address], [place], [place] [address]    Culture >=100,000 COLONIES/mL ENTEROCOCCUS FAECALIS (A)  Final   Report Status 05/15/2023 FINAL  Final   Organism ID, Bacteria ENTEROCOCCUS FAECALIS (A)  Final      Susceptibility   Enterococcus faecalis - MIC*    AMPICILLIN <=2 SENSITIVE Sensitive     NITROFURANTOIN <=16  SENSITIVE Sensitive     VANCOMYCIN 1 SENSITIVE Sensitive     * >=100,000 COLONIES/mL ENTEROCOCCUS FAECALIS  Culture, blood (Routine X 2) w Reflex to ID Panel     Status: None (Preliminary result)   Collection Time: 05/15/23  4:10 AM   Specimen: BLOOD  Result Value Ref Range Status   Specimen Description   Final    BLOOD RIGHT ANTECUBITAL Performed at [facility], [address], [place], [place] [address]    Special Requests   Final    BOTTLES DRAWN AEROBIC AND ANAEROBIC Blood Culture results may not be optimal due to an inadequate volume of blood received in culture bottles Performed at [facility], [address] [place], [place] [address]    Culture   Final    NO GROWTH 3 DAYS Performed at [facility], [address] [place], [place] [address]    Report Status PENDING  Incomplete  Culture, blood (Routine X 2) w Reflex to ID Panel     Status: None (Preliminary result)   Collection Time: 05/15/23  4:10 AM   Specimen: BLOOD RIGHT FOREARM  Result Value Ref Range Status   Specimen Description   Final    BLOOD RIGHT FOREARM Performed at [facility], [name] [place], [place] [address]    Special Requests   Final    BOTTLES DRAWN AEROBIC AND ANAEROBIC Blood Culture results may not be optimal due to an inadequate volume of blood received in culture bottles Performed at [facility], [address], [place], [place] [address]    Culture   Final    NO GROWTH 3 DAYS Performed at [facility], [address], [place], [place] [address]    Report Status PENDING  Incomplete     Serology:   Imaging: If present, new imagings (plain films, ct scans, and mri) have been personally visualized and interpreted; radiology reports have been reviewed. Decision making incorporated into the Impression / Recommendations.  3/21 tte Technically difficult study; no obvious veg  3/29 cxr  clear    Raymondo Band, MD [facility] [phone] pager    05/18/2023, 2:42 PM

## 2023-05-18 NOTE — Progress Notes (Signed)
 PROGRESS NOTE  Andre Casey.  SWN:[id number] DOB: [birth year] DOA: 05/13/2023 PCP: Lupita Raider, MD  Consultants  Brief Narrative:  89 y.o. male PMH significant for hypertension, hypercholesterolemia, dementia, CAD, asthma, chronic suprapubic catheter, history of prostate cancer presented in the ED with complaints of fever and foul-smelling urine for last 2 days.  Patient has suprapubic catheter and which has been changed every month.  Patient also reports having some shortness of breath associated with fever and cough.  Patient denies any abdominal pain, nausea, vomiting and recent travel or sick contacts. Came to ED with same and admitted, found to have enterococcal UTI with positive blood cultures for the same.  ID following.  Blood cultures with no growth today.  3/24 plan is to switch to oral antibiotics, watch overnight, if continues to do well can discharge home tomorrow.  Assessment & Plan: Sepsis secondary to UTI,  Enterococcus faecalis bacteremia  Sepsis has resolved.   Repeat blood cx's NGTD. Echo with normal EF but valves unable to be visualized.   Suprapubic catheter was exchanged in the ED on 05/13/2023 ID consulted, appreciate input.  Abx narrowed to oral amoxicillin.   Plan will be DC home with 2 weeks total abx until 05/30/23.  Appreciate ID input and recommendations   # History of prostate cancer with resultant suprapubic catheter: Patient follows with urology and suprapubic catheter is changed every month. Suprapubic catheter was changed in the ER on 03/19 Will resume monthly changes on DC.    # AKI on CKD stage IIIb: - Resolved yesterday and patient was below his baseline creatinine. -His baseline appears to actually be 1.3-1.5.  Is there today as well.  Anemia: - Baseline of normocytic anemia, b/n 9-10 going back to at least 2023 - elevated here to 11, up to 13 on admit.  Most likely hemoconcentration.  No blood loss -Hemoglobin has increased today to 12.1  from yesterday 9.8.  Encourage oral hydration intake. - Normocytic anemia secondary to prostate CA/anemia of chronic disease   Essential hypertension: Continue amlodipine 10 mg daily   Physical deconditioning: Recommending home health PT/OT at discharge.  Pt lives alone.  Has daughter in [place] but no one closer. -Despite this he reports he can take care of himself at home and would prefer home rather than skilled nursing facility.   Dementia/Depression At his baseline.  Forgetful but oriented. Continue Namenda 10 mg daily Continue Remeron 15 mg daily   Asthma: Not acutely exacerbated Continue home treatment, which is just rescue nebulizer.  Using 1-2 x daily.  Would likely benefit from long-acting, but will defer to PCP in this 89 yo patient.    DVT prophylaxis:  heparin injection 5,000 Units Start: 05/13/23 2200  Code Status:   Code Status: Limited: Do not attempt resuscitation (DNR) -DNR-LIMITED -Do Not Intubate/DNI  Level of care: Med-Surg Status is: Inpatient  Dispo:  Home w/ HHPT when medically ready, most likely 3/25.   Subjective: Patient feels well today.  Sitting in bedside chair which is new for him today.  Reports this helps but is ready to get back into bed.  No complaints today.  No fevers or chills  Objective: Vitals:   05/17/23 2044 05/17/23 2054 05/18/23 0420 05/18/23 1050  BP: 127/71  122/63 135/75  Pulse: 70  66 76  Resp: 17  19 16   Temp: 98.2 F (36.8 C)  98.2 F (36.8 C) 98.6 F (37 C)  TempSrc:   Oral   SpO2: 97% 97% 97% 98%  Weight:      Height:        Intake/Output Summary (Last 24 hours) at 05/18/2023 1523 Last data filed at 05/18/2023 1225 Gross per 24 hour  Intake 260 ml  Output 1820 ml  Net -1560 ml   Filed Weights   05/13/23 1100  Weight: 55 kg   Body mass index is 18.99 kg/m.  Gen: 89 y.o. male in no apparent distress.  Nontoxic Pulm: Non-labored breathing.  Clear to auscultation bilaterally.  CV: Regular rate and rhythm.  No murmur, rub, or gallop. No JVD GI: Abdomen soft, non-tender, non-distended, with normoactive bowel sounds. No organomegaly or masses felt. Ext: Warm, no deformities, no pedal edema Skin: No rashes, lesions noulcers Neuro: Alert and oriented. No focal neurological deficits. Psych: Calm  Judgement and insight appear normal. Mood & affect appropriate.     I have personally reviewed the following labs and images: CBC: Recent Labs  Lab 05/13/23 1206 05/13/23 1751 05/14/23 0436 05/15/23 0410 05/17/23 0430 05/18/23 0953  WBC 18.5* 15.5* 12.0* 7.9 6.2 8.2  NEUTROABS 14.8*  --   --   --   --   --   HGB 13.1 11.9* 11.4* 11.0* 9.8* 12.1*  HCT 41.4 37.4* 36.5* 35.3* 32.3* 39.6  MCV 90.0 89.0 90.1 90.3 92.8 91.0  PLT 284 254 234 262 283 452*   BMP &GFR Recent Labs  Lab 05/13/23 1206 05/13/23 1751 05/14/23 0436 05/15/23 0410 05/17/23 0430 05/18/23 0953  NA 134*  --  132* 137 138 140  K 3.5  --  3.3* 3.5 4.0 3.7  CL 98  --  100 108 109 108  CO2 21*  --  22 19* 20* 21*  GLUCOSE 117*  --  109* 94 112* 182*  BUN 21  --  24* 21 21 16   CREATININE 1.63* 1.64* 1.58* 1.42* 1.15 1.37*  CALCIUM 9.1  --  8.3* 8.3* 8.5* 9.1  MG  --   --  2.3 2.2  --   --   PHOS  --   --  2.9 2.1*  --   --    Estimated Creatinine Clearance: 27.3 mL/min (A) (by C-G formula based on SCr of 1.37 mg/dL (H)). Liver & Pancreas: Recent Labs  Lab 05/13/23 1206 05/14/23 0436  AST 22 17  ALT 13 10  ALKPHOS 79 62  BILITOT 0.8 0.9  PROT 7.8 6.5  ALBUMIN 3.7 2.8*   No results for input(s): "LIPASE", "AMYLASE" in the last 168 hours. No results for input(s): "AMMONIA" in the last 168 hours. Diabetic: No results for input(s): "HGBA1C" in the last 72 hours. Recent Labs  Lab 05/15/23 1754 05/15/23 2022 05/16/23 0741 05/16/23 1138 05/18/23 0820  GLUCAP 90 137* 96 177* 100*   Cardiac Enzymes: No results for input(s): "CKTOTAL", "CKMB", "CKMBINDEX", "TROPONINI" in the last 168 hours. No results for  input(s): "PROBNP" in the last 8760 hours. Coagulation Profile: Recent Labs  Lab 05/13/23 1206  INR 1.2   Thyroid Function Tests: No results for input(s): "TSH", "T4TOTAL", "FREET4", "T3FREE", "THYROIDAB" in the last 72 hours. Lipid Profile: No results for input(s): "CHOL", "HDL", "LDLCALC", "TRIG", "CHOLHDL", "LDLDIRECT" in the last 72 hours. Anemia Panel: No results for input(s): "VITAMINB12", "FOLATE", "FERRITIN", "TIBC", "IRON", "RETICCTPCT" in the last 72 hours. Urine analysis:    Component Value Date/Time   COLORURINE STRAW (A) 05/13/2023 1500   APPEARANCEUR CLEAR 05/13/2023 1500   LABSPEC 1.010 05/13/2023 1500   PHURINE 7.0 05/13/2023 1500   GLUCOSEU NEGATIVE 05/13/2023 1500  HGBUR MODERATE (A) 05/13/2023 1500   BILIRUBINUR NEGATIVE 05/13/2023 1500   KETONESUR NEGATIVE 05/13/2023 1500   PROTEINUR 100 (A) 05/13/2023 1500   UROBILINOGEN 0.2 05/15/2007 1118   NITRITE NEGATIVE 05/13/2023 1500   LEUKOCYTESUR LARGE (A) 05/13/2023 1500   Sepsis Labs: Invalid input(s): "PROCALCITONIN", "LACTICIDVEN"  Microbiology: Recent Results (from the past 240 hours)  Blood Culture (routine x 2)     Status: None   Collection Time: 05/13/23 12:06 PM   Specimen: BLOOD  Result Value Ref Range Status   Specimen Description   Final    BLOOD SITE NOT SPECIFIED Performed at [facility], [address], [place], [place] [address]    Special Requests   Final    BOTTLES DRAWN AEROBIC AND ANAEROBIC Blood Culture results may not be optimal due to an inadequate volume of blood received in culture bottles Performed at [facility], [address], [place], [place] [address]    Culture   Final    NO GROWTH 5 DAYS Performed at [facility], [address], [place], [place] [address]    Report Status 05/18/2023 FINAL  Final  Blood Culture (routine x 2)     Status: Abnormal   Collection Time: 05/13/23 12:16 PM   Specimen: BLOOD  Result Value Ref  Range Status   Specimen Description   Final    BLOOD SITE NOT SPECIFIED Performed at [facility], [address] [place], [place] [address]    Special Requests   Final    BOTTLES DRAWN AEROBIC AND ANAEROBIC Blood Culture results may not be optimal due to an inadequate volume of blood received in culture bottles Performed at [facility], [address], [place], [place] [address]    Culture  Setup Time   Final    GRAM POSITIVE COCCI IN CHAINS ANAEROBIC BOTTLE ONLY CRITICAL RESULT CALLED TO, READ BACK BY AND VERIFIED WITH: PHARMD Sydney D on 032025 @1026  by SM    Culture (A)  Final    ENTEROCOCCUS FAECALIS SUSCEPTIBILITIES TO FOLLOW Performed at [facility], [address], [place], [place] [address]    Report Status 05/16/2023 FINAL  Final   Organism ID, Bacteria ENTEROCOCCUS FAECALIS  Final      Susceptibility   Enterococcus faecalis - MIC*    AMPICILLIN <=2 SENSITIVE Sensitive     VANCOMYCIN 1 SENSITIVE Sensitive     GENTAMICIN SYNERGY SENSITIVE Sensitive     * ENTEROCOCCUS FAECALIS  Blood Culture ID Panel (Reflexed)     Status: Abnormal   Collection Time: 05/13/23 12:16 PM  Result Value Ref Range Status   Enterococcus faecalis DETECTED (A) NOT DETECTED Final    Comment: CRITICAL RESULT CALLED TO, READ BACK BY AND VERIFIED WITH: PHARMD Sydney D on 032025 @1026  by SM    Enterococcus Faecium NOT DETECTED NOT DETECTED Final   Listeria monocytogenes NOT DETECTED NOT DETECTED Final   Staphylococcus species NOT DETECTED NOT DETECTED Final   Staphylococcus aureus (BCID) NOT DETECTED NOT DETECTED Final   Staphylococcus epidermidis NOT DETECTED NOT DETECTED Final   Staphylococcus lugdunensis NOT DETECTED NOT DETECTED Final   Streptococcus species NOT DETECTED NOT DETECTED Final   Streptococcus agalactiae NOT DETECTED NOT DETECTED Final   Streptococcus pneumoniae NOT DETECTED NOT DETECTED Final   Streptococcus pyogenes NOT DETECTED  NOT DETECTED Final   A.calcoaceticus-baumannii NOT DETECTED NOT DETECTED Final   Bacteroides fragilis NOT DETECTED NOT DETECTED Final   Enterobacterales NOT  DETECTED NOT DETECTED Final   Enterobacter cloacae complex NOT DETECTED NOT DETECTED Final   Escherichia coli NOT DETECTED NOT DETECTED Final   Klebsiella aerogenes NOT DETECTED NOT DETECTED Final   Klebsiella oxytoca NOT DETECTED NOT DETECTED Final   Klebsiella pneumoniae NOT DETECTED NOT DETECTED Final   Proteus species NOT DETECTED NOT DETECTED Final   Salmonella species NOT DETECTED NOT DETECTED Final   Serratia marcescens NOT DETECTED NOT DETECTED Final   Haemophilus influenzae NOT DETECTED NOT DETECTED Final   Neisseria meningitidis NOT DETECTED NOT DETECTED Final   Pseudomonas aeruginosa NOT DETECTED NOT DETECTED Final   Stenotrophomonas maltophilia NOT DETECTED NOT DETECTED Final   Candida albicans NOT DETECTED NOT DETECTED Final   Candida auris NOT DETECTED NOT DETECTED Final   Candida glabrata NOT DETECTED NOT DETECTED Final   Candida krusei NOT DETECTED NOT DETECTED Final   Candida parapsilosis NOT DETECTED NOT DETECTED Final   Candida tropicalis NOT DETECTED NOT DETECTED Final   Cryptococcus neoformans/gattii NOT DETECTED NOT DETECTED Final   Vancomycin resistance NOT DETECTED NOT DETECTED Final    Comment: Performed at [facility], [address] [place], [place] [address]  Resp panel by RT-PCR (RSV, Flu A&B, Covid)     Status: None   Collection Time: 05/13/23 12:20 PM   Specimen: Nasal Swab  Result Value Ref Range Status   SARS Coronavirus 2 by RT PCR NEGATIVE NEGATIVE Final    Comment: (NOTE) SARS-CoV-2 target nucleic acids are NOT DETECTED.  The SARS-CoV-2 RNA is generally detectable in upper respiratory specimens during the acute phase of infection. The lowest concentration of SARS-CoV-2 viral copies this assay can detect is 138 copies/mL. A negative result does not preclude SARS-Cov-2 infection and  should not be used as the sole basis for treatment or other patient management decisions. A negative result may occur with  improper specimen collection/handling, submission of specimen other than nasopharyngeal swab, presence of viral mutation(s) within the areas targeted by this assay, and inadequate number of viral copies(<138 copies/mL). A negative result must be combined with clinical observations, patient history, and epidemiological information. The expected result is Negative.  Fact Sheet for Patients:  BloggerCourse.com  Fact Sheet for Healthcare Providers:  SeriousBroker.it  This test is no t yet approved or cleared by the [place] FDA and  has been authorized for detection and/or diagnosis of SARS-CoV-2 by FDA under an Emergency Use Authorization (EUA). This EUA will remain  in effect (meaning this test can be used) for the duration of the COVID-19 declaration under Section 564(b)(1) of the Act, 21 U.S.C.section 360bbb-3(b)(1), unless the authorization is terminated  or revoked sooner.       Influenza A by PCR NEGATIVE NEGATIVE Final   Influenza B by PCR NEGATIVE NEGATIVE Final    Comment: (NOTE) The Xpert Xpress SARS-CoV-2/FLU/RSV plus assay is intended as an aid in the diagnosis of influenza from Nasopharyngeal swab specimens and should not be used as a sole basis for treatment. Nasal washings and aspirates are unacceptable for Xpert Xpress SARS-CoV-2/FLU/RSV testing.  Fact Sheet for Patients: BloggerCourse.com  Fact Sheet for Healthcare Providers: SeriousBroker.it  This test is not yet approved or cleared by the [place] FDA and has been authorized for detection and/or diagnosis of SARS-CoV-2 by FDA under an Emergency Use Authorization (EUA). This EUA will remain in effect (meaning this test can be used) for the duration of the COVID-19 declaration  under Section 564(b)(1) of the Act, 21 U.S.C. section 360bbb-3(b)(1), unless  the authorization is terminated or revoked.     Resp Syncytial Virus by PCR NEGATIVE NEGATIVE Final    Comment: (NOTE) Fact Sheet for Patients: BloggerCourse.com  Fact Sheet for Healthcare Providers: SeriousBroker.it  This test is not yet approved or cleared by the [place] FDA and has been authorized for detection and/or diagnosis of SARS-CoV-2 by FDA under an Emergency Use Authorization (EUA). This EUA will remain in effect (meaning this test can be used) for the duration of the COVID-19 declaration under Section 564(b)(1) of the Act, 21 U.S.C. section 360bbb-3(b)(1), unless the authorization is terminated or revoked.  Performed at [facility], [address], [place], [place] [address]   Urine Culture     Status: Abnormal   Collection Time: 05/13/23  3:00 PM   Specimen: Urine, Suprapubic  Result Value Ref Range Status   Specimen Description   Final    URINE, SUPRAPUBIC Performed at [facility], [address], [place], [place] [address]    Special Requests   Final    NONE Reflexed from [id number] Performed at [facility], [address], [place], [place] [address]    Culture >=100,000 COLONIES/mL ENTEROCOCCUS FAECALIS (A)  Final   Report Status 05/15/2023 FINAL  Final   Organism ID, Bacteria ENTEROCOCCUS FAECALIS (A)  Final      Susceptibility   Enterococcus faecalis - MIC*    AMPICILLIN <=2 SENSITIVE Sensitive     NITROFURANTOIN <=16 SENSITIVE Sensitive     VANCOMYCIN 1 SENSITIVE Sensitive     * >=100,000 COLONIES/mL ENTEROCOCCUS FAECALIS  Culture, blood (Routine X 2) w Reflex to ID Panel     Status: None (Preliminary result)   Collection Time: 05/15/23  4:10 AM   Specimen: BLOOD  Result Value Ref Range Status   Specimen Description   Final    BLOOD RIGHT ANTECUBITAL Performed at [facility], [address], [place], [place] [address]    Special Requests   Final    BOTTLES DRAWN AEROBIC AND ANAEROBIC Blood Culture results may not be optimal due to an inadequate volume of blood received in culture bottles Performed at [facility], [address] [place], [place] [address]    Culture   Final    NO GROWTH 3 DAYS Performed at [facility], [address], [place], [place] [address]    Report Status PENDING  Incomplete  Culture, blood (Routine X 2) w Reflex to ID Panel     Status: None (Preliminary result)   Collection Time: 05/15/23  4:10 AM   Specimen: BLOOD RIGHT FOREARM  Result Value Ref Range Status   Specimen Description   Final    BLOOD RIGHT FOREARM Performed at [facility], [address] [place], [place] [address]    Special Requests   Final    BOTTLES DRAWN AEROBIC AND ANAEROBIC Blood Culture results may not be optimal due to an inadequate volume of blood received in culture bottles Performed at [facility], [address] [place], [place] [address]    Culture   Final    NO GROWTH 3 DAYS Performed at [facility], [address] [place], [place] [address]    Report Status PENDING  Incomplete    Radiology Studies: No results found.   Scheduled Meds:  acidophilus  1 capsule Oral Q lunch   amLODipine  10 mg Oral QHS   amoxicillin  1,000 mg Oral  Q12H   aspirin EC  81 mg Oral Daily   Chlorhexidine Gluconate Cloth  6 each Topical Daily   docusate sodium  100 mg Oral BID   heparin  5,000 Units Subcutaneous Q8H   ipratropium-albuterol  3 mL Nebulization BID   memantine  10 mg Oral BID   mirtazapine  15 mg Oral QHS   potassium chloride  20 mEq Oral BID   sodium chloride flush  10-40 mL Intracatheter Q12H   Continuous Infusions:     LOS: 5 days   35 minutes with more than 50% spent in reviewing records, counseling patient/family and coordinating  care.  Tobey Grim, MD [facility] [email or web address] 05/18/2023, 3:23 PM

## 2023-05-18 NOTE — Discharge Summary (Incomplete)
 Physician Discharge Summary   Patient: Andre Casey. MRN: [id number] DOB: [birth year]  Admit date:     05/13/2023  Discharge date: {dischdate:26783}  Discharge Physician: Tobey Grim   PCP: Lupita Raider, MD   Recommendations at discharge:  {Tip this will not be part of the note when signed- Example include specific recommendations for outpatient follow-up, pending tests to follow-up on. (Optional):26781}  ***  Discharge Diagnoses: Principal Problem:   Enterococcal bacteremia Active Problems:   AKI on CKD-3A   Prostate cancer metastatic to multiple sites [facility])   Physical deconditioning   Essential hypertension   Dyslipidemia, goal LDL below 70   Sepsis (HCC)   Enterococcus faecalis infection  Resolved Problems:   * No resolved hospital problems. *  Hospital Course: 89 y.o. male PMH significant for hypertension, hypercholesterolemia, dementia, CAD, asthma, chronic suprapubic catheter, history of prostate cancer presented in the ED with complaints of fever and foul-smelling urine for last 2 days.  Patient has suprapubic catheter and which has been changed every month.  Patient also reports having some shortness of breath associated with fever and cough.  Patient denies any abdominal pain, nausea, vomiting and recent travel or sick contacts. Came to ED with same and admitted, found to have enterococcal UTI with positive blood cultures for the same.  ID following, started on IV abx.  TTE negative, TEE deferred with plan for just IV abx treatment.  Repeat blood cultures with no growth.  3/24 plan switched to PO abx and watched overnight.   Assessment & Plan: Sepsis secondary to UTI,  Enterococcus faecalis bacteremia  Sepsis has resolved.   Repeat blood cx's NGTD. Echo with normal EF but valves unable to be visualized.   Suprapubic catheter was exchanged in the ED on 05/13/2023 ID consulted, appreciate input.  Abx narrowed to oral amoxicillin.   Plan will be DC home  with 2 weeks total abx until 05/30/23.  Appreciate ID input and recommendations   # History of prostate cancer with resultant suprapubic catheter: Patient follows with urology and suprapubic catheter is changed every month. Suprapubic catheter was changed in the ER on 03/19 Will resume monthly changes on DC.    # AKI on CKD stage IIIb: - Resolved yesterday and patient was below his baseline creatinine. -His baseline appears to actually be 1.3-1.5.  Is there today as well.   Anemia: - Baseline of normocytic anemia, b/n 9-10 going back to at least 2023 - elevated here to 11, up to 13 on admit.  Most likely hemoconcentration.  No blood loss -Hemoglobin has increased today to 12.1 from yesterday 9.8.  Encourage oral hydration intake. - Normocytic anemia secondary to prostate CA/anemia of chronic disease   Essential hypertension: Continue amlodipine 10 mg daily   Physical deconditioning: Recommending home health PT/OT at discharge.  Pt lives alone.  Has daughter in [place] but no one closer. -Despite this he reports he can take care of himself at home and would prefer home rather than skilled nursing facility.   Dementia/Depression At his baseline.  Forgetful but oriented. Continue Namenda 10 mg daily Continue Remeron 15 mg daily   Asthma: Not acutely exacerbated Continue home treatment, which is just rescue nebulizer.  Using 1-2 x daily.  Would likely benefit from long-acting, but will defer to PCP in this 89 yo patient.     {Tip this will not be part of the note when signed Body mass index is 18.99 kg/m. , ,  (Optional):26781}  {(NOTE) Pain control  PDMP Statment (Optional):26782} Consultants: Infectious Disease Procedures performed: TTE, TEE deferred  Disposition: {Plan; Disposition:26390} Diet recommendation:  {Diet_Plan:26776} DISCHARGE MEDICATION: Allergies as of 05/18/2023       Reactions   Sertraline Diarrhea   Simvastatin Diarrhea   Aricept [donepezil] Nausea  Only   Report upset stomach - unable to tolerate.   Namzaric [memantine Hcl-donepezil Hcl] Other (See Comments)   Stomach upset  Pt able to take Memantine by itself.   Wellbutrin [bupropion] Anxiety     Med Rec must be completed prior to using this SMARTLINK***       Follow-up Information     [facility] Follow up.   Why: [facility] will follow up with you at discharge to provide home health services Contact information: [address] [place] [address] [phone]                Discharge Exam: Ceasar Mons Weights   05/13/23 1100  Weight: 55 kg   ***  Condition at discharge: {DC Condition:26389}  The results of significant diagnostics from this hospitalization (including imaging, microbiology, ancillary and laboratory) are listed below for reference.   Imaging Studies: ECHOCARDIOGRAM COMPLETE Result Date: 05/15/2023    ECHOCARDIOGRAM REPORT   Patient Name:   Andre Casey. Date of Exam: 05/15/2023 Medical Rec #:  [id number]                Height:       67.0 in Accession #:    [id number]               Weight:       121.3 lb Date of Birth:  05/10/1929                BSA:          1.635 m Patient Age:    89 years                 BP:           115/54 mmHg Patient Gender: M                        HR:           77 bpm. Exam Location:  Inpatient Procedure: 2D Echo, Cardiac Doppler, Color Doppler and Intracardiac            Opacification Agent (Both Spectral and Color Flow Doppler were            utilized during procedure). Indications:    Bacteremia  History:        Patient has prior history of Echocardiogram examinations, most                 recent 01/26/2023. CAD, Arrythmias:LBBB; Risk                 Factors:Hypertension and Dyslipidemia.  Sonographer:    Vern Claude Referring Phys: [id number] CORNELIUS N VAN DAM  Sonographer Comments: Technically difficult study due to poor echo windows. Image acquisition challenging due to respiratory motion. IMPRESSIONS  1. Very technically  difficult study. Unable to evaluate valves for endocarditis.  2. Left ventricular ejection fraction, by estimation, is 65 to 70%. Left ventricular ejection fraction by PLAX is 67 %. The left ventricle has normal function. The left ventricle has no regional wall motion abnormalities. Left ventricular diastolic parameters are consistent with Grade I diastolic dysfunction (impaired relaxation).  3. Right ventricular systolic function was not well visualized.  The right ventricular size is not well visualized. There is normal pulmonary artery systolic pressure. The estimated right ventricular systolic pressure is 32.8 mmHg.  4. The mitral valve was not well visualized. No evidence of mitral valve regurgitation.  5. The aortic valve was not well visualized. Aortic valve regurgitation is not visualized. Comparison(s): Changes from prior study are noted. 01/26/2023: LVEF 60-65%. Conclusion(s)/Recommendation(s): If suspicion for endocarditis is high, consider TEE, as this study could now allow evaluation of the valves for endocarditis. FINDINGS  Left Ventricle: Left ventricular ejection fraction, by estimation, is 65 to 70%. Left ventricular ejection fraction by PLAX is 67 %. The left ventricle has normal function. The left ventricle has no regional wall motion abnormalities. Definity contrast agent was given IV to delineate the left ventricular endocardial borders. The left ventricular internal cavity size was normal in size. There is no left ventricular hypertrophy. Abnormal (paradoxical) septal motion, consistent with left bundle branch block. Left ventricular diastolic parameters are consistent with Grade I diastolic dysfunction (impaired relaxation). Indeterminate filling pressures. Right Ventricle: The right ventricular size is not well visualized. Right vetricular wall thickness was not well visualized. Right ventricular systolic function was not well visualized. There is normal pulmonary artery systolic pressure. The  tricuspid regurgitant velocity is 2.73 m/s, and with an assumed right atrial pressure of 3 mmHg, the estimated right ventricular systolic pressure is 32.8 mmHg. Left Atrium: Left atrial size was not well visualized. Right Atrium: Right atrial size was not well visualized. Pericardium: The pericardium was not well visualized. Mitral Valve: The mitral valve was not well visualized. No evidence of mitral valve regurgitation. MV peak gradient, 4.1 mmHg. The mean mitral valve gradient is 1.0 mmHg. Tricuspid Valve: The tricuspid valve is not well visualized. Tricuspid valve regurgitation is trivial. Aortic Valve: The aortic valve was not well visualized. Aortic valve regurgitation is not visualized. Aortic valve mean gradient measures 4.0 mmHg. Aortic valve peak gradient measures 7.3 mmHg. Aortic valve area, by VTI measures 2.35 cm. Pulmonic Valve: The pulmonic valve was not well visualized. Pulmonic valve regurgitation is not visualized. Aorta: The aortic root was not well visualized. IAS/Shunts: The interatrial septum was not assessed.  LEFT VENTRICLE PLAX 2D LV EF:         Left            Diastology                ventricular     LV e' medial:    5.44 cm/s                ejection        LV E/e' medial:  8.8                fraction by     LV e' lateral:   8.95 cm/s                PLAX is 67      LV E/e' lateral: 5.4                %. LVIDd:         3.60 cm LVIDs:         2.30 cm LV PW:         0.70 cm LV IVS:        0.80 cm LVOT diam:     2.00 cm LV SV:         50 LV SV Index:   31 LVOT Area:  3.14 cm  LV Volumes (MOD) LV vol d, MOD    110.0 ml A4C: LV vol s, MOD    20.6 ml A4C: LV SV MOD A4C:   110.0 ml RIGHT VENTRICLE RV Basal diam:  3.10 cm RV Mid diam:    2.50 cm LEFT ATRIUM             Index        RIGHT ATRIUM          Index LA diam:        3.20 cm 1.96 cm/m   RA Area:     7.00 cm LA Vol (A2C):   31.2 ml 19.08 ml/m  RA Volume:   9.81 ml  6.00 ml/m LA Vol (A4C):   31.9 ml 19.51 ml/m LA Biplane Vol: 31.9  ml 19.51 ml/m  AORTIC VALVE                    PULMONIC VALVE AV Area (Vmax):    2.44 cm     PV Vmax:       0.79 m/s AV Area (Vmean):   2.59 cm     PV Peak grad:  2.5 mmHg AV Area (VTI):     2.35 cm AV Vmax:           135.00 cm/s AV Vmean:          86.600 cm/s AV VTI:            0.214 m AV Peak Grad:      7.3 mmHg AV Mean Grad:      4.0 mmHg LVOT Vmax:         105.00 cm/s LVOT Vmean:        71.300 cm/s LVOT VTI:          0.160 m LVOT/AV VTI ratio: 0.75  AORTA Ao Root diam: 3.10 cm Ao Asc diam:  3.00 cm MITRAL VALVE               TRICUSPID VALVE MV Area (PHT): 3.93 cm    TR Peak grad:   29.8 mmHg MV Area VTI:   2.63 cm    TR Vmax:        273.00 cm/s MV Peak grad:  4.1 mmHg MV Mean grad:  1.0 mmHg    SHUNTS MV Vmax:       1.01 m/s    Systemic VTI:  0.16 m MV Vmean:      55.4 cm/s   Systemic Diam: 2.00 cm MV Decel Time: 193 msec MV E velocity: 48.00 cm/s MV A velocity: 96.70 cm/s MV E/A ratio:  0.50 Zoila Shutter MD Electronically signed by Zoila Shutter MD Signature Date/Time: 05/15/2023/4:14:49 PM    Final    DG Chest Port 1 View Result Date: 05/13/2023 CLINICAL DATA:  Questionable sepsis - evaluate for abnormality. Fever. Dementia. EXAM: PORTABLE CHEST 1 VIEW COMPARISON:  04/08/2023. FINDINGS: Bilateral lung fields are clear. Bilateral costophrenic angles are clear. Normal cardio-mediastinal silhouette. No acute osseous abnormalities. Right reverse shoulder arthroplasty noted. The soft tissues are within normal limits. IMPRESSION: No active disease. Electronically Signed   By: Jules Schick M.D.   On: 05/13/2023 14:39    Microbiology: Results for orders placed or performed during the hospital encounter of 05/13/23  Blood Culture (routine x 2)     Status: None   Collection Time: 05/13/23 12:06 PM   Specimen: BLOOD  Result Value Ref Range Status   Specimen Description   Final  BLOOD SITE NOT SPECIFIED Performed at [facility], [address], [place], [place] [address]     Special Requests   Final    BOTTLES DRAWN AEROBIC AND ANAEROBIC Blood Culture results may not be optimal due to an inadequate volume of blood received in culture bottles Performed at [facility], [address] [place], [place] [address]    Culture   Final    NO GROWTH 5 DAYS Performed at [facility], [address], [place], [place] [address]    Report Status 05/18/2023 FINAL  Final  Blood Culture (routine x 2)     Status: Abnormal   Collection Time: 05/13/23 12:16 PM   Specimen: BLOOD  Result Value Ref Range Status   Specimen Description   Final    BLOOD SITE NOT SPECIFIED Performed at [facility], [address] [place], [place] [address]    Special Requests   Final    BOTTLES DRAWN AEROBIC AND ANAEROBIC Blood Culture results may not be optimal due to an inadequate volume of blood received in culture bottles Performed at [facility], [address] [place], [place] [address]    Culture  Setup Time   Final    GRAM POSITIVE COCCI IN CHAINS ANAEROBIC BOTTLE ONLY CRITICAL RESULT CALLED TO, READ BACK BY AND VERIFIED WITH: PHARMD Sydney D on 032025 @1026  by SM    Culture (A)  Final    ENTEROCOCCUS FAECALIS SUSCEPTIBILITIES TO FOLLOW Performed at [facility], [address], [place], [place] [address]    Report Status 05/16/2023 FINAL  Final   Organism ID, Bacteria ENTEROCOCCUS FAECALIS  Final      Susceptibility   Enterococcus faecalis - MIC*    AMPICILLIN <=2 SENSITIVE Sensitive     VANCOMYCIN 1 SENSITIVE Sensitive     GENTAMICIN SYNERGY SENSITIVE Sensitive     * ENTEROCOCCUS FAECALIS  Blood Culture ID Panel (Reflexed)     Status: Abnormal   Collection Time: 05/13/23 12:16 PM  Result Value Ref Range Status   Enterococcus faecalis DETECTED (A) NOT DETECTED Final    Comment: CRITICAL RESULT CALLED TO, READ BACK BY AND VERIFIED WITH: PHARMD Sydney D on 032025 @1026  by SM    Enterococcus Faecium NOT  DETECTED NOT DETECTED Final   Listeria monocytogenes NOT DETECTED NOT DETECTED Final   Staphylococcus species NOT DETECTED NOT DETECTED Final   Staphylococcus aureus (BCID) NOT DETECTED NOT DETECTED Final   Staphylococcus epidermidis NOT DETECTED NOT DETECTED Final   Staphylococcus lugdunensis NOT DETECTED NOT DETECTED Final   Streptococcus species NOT DETECTED NOT DETECTED Final   Streptococcus agalactiae NOT DETECTED NOT DETECTED Final   Streptococcus pneumoniae NOT DETECTED NOT DETECTED Final   Streptococcus pyogenes NOT DETECTED NOT DETECTED Final   A.calcoaceticus-baumannii NOT DETECTED NOT DETECTED Final   Bacteroides fragilis NOT DETECTED NOT DETECTED Final   Enterobacterales NOT DETECTED NOT DETECTED Final   Enterobacter cloacae complex NOT DETECTED NOT DETECTED Final   Escherichia coli NOT DETECTED NOT DETECTED Final   Klebsiella aerogenes NOT DETECTED NOT DETECTED Final   Klebsiella oxytoca NOT DETECTED NOT DETECTED Final   Klebsiella pneumoniae NOT DETECTED NOT DETECTED Final   Proteus species NOT DETECTED NOT DETECTED Final   Salmonella species NOT DETECTED NOT DETECTED Final   Serratia marcescens NOT DETECTED NOT DETECTED Final   Haemophilus influenzae NOT DETECTED NOT DETECTED Final   Neisseria meningitidis NOT DETECTED NOT DETECTED Final   Pseudomonas aeruginosa  NOT DETECTED NOT DETECTED Final   Stenotrophomonas maltophilia NOT DETECTED NOT DETECTED Final   Candida albicans NOT DETECTED NOT DETECTED Final   Candida auris NOT DETECTED NOT DETECTED Final   Candida glabrata NOT DETECTED NOT DETECTED Final   Candida krusei NOT DETECTED NOT DETECTED Final   Candida parapsilosis NOT DETECTED NOT DETECTED Final   Candida tropicalis NOT DETECTED NOT DETECTED Final   Cryptococcus neoformans/gattii NOT DETECTED NOT DETECTED Final   Vancomycin resistance NOT DETECTED NOT DETECTED Final    Comment: Performed at [facility], [address], [place], [place] [address]  Resp  panel by RT-PCR (RSV, Flu A&B, Covid)     Status: None   Collection Time: 05/13/23 12:20 PM   Specimen: Nasal Swab  Result Value Ref Range Status   SARS Coronavirus 2 by RT PCR NEGATIVE NEGATIVE Final    Comment: (NOTE) SARS-CoV-2 target nucleic acids are NOT DETECTED.  The SARS-CoV-2 RNA is generally detectable in upper respiratory specimens during the acute phase of infection. The lowest concentration of SARS-CoV-2 viral copies this assay can detect is 138 copies/mL. A negative result does not preclude SARS-Cov-2 infection and should not be used as the sole basis for treatment or other patient management decisions. A negative result may occur with  improper specimen collection/handling, submission of specimen other than nasopharyngeal swab, presence of viral mutation(s) within the areas targeted by this assay, and inadequate number of viral copies(<138 copies/mL). A negative result must be combined with clinical observations, patient history, and epidemiological information. The expected result is Negative.  Fact Sheet for Patients:  BloggerCourse.com  Fact Sheet for Healthcare Providers:  SeriousBroker.it  This test is no t yet approved or cleared by the [place] FDA and  has been authorized for detection and/or diagnosis of SARS-CoV-2 by FDA under an Emergency Use Authorization (EUA). This EUA will remain  in effect (meaning this test can be used) for the duration of the COVID-19 declaration under Section 564(b)(1) of the Act, 21 U.S.C.section 360bbb-3(b)(1), unless the authorization is terminated  or revoked sooner.       Influenza A by PCR NEGATIVE NEGATIVE Final   Influenza B by PCR NEGATIVE NEGATIVE Final    Comment: (NOTE) The Xpert Xpress SARS-CoV-2/FLU/RSV plus assay is intended as an aid in the diagnosis of influenza from Nasopharyngeal swab specimens and should not be used as a sole basis for treatment. Nasal  washings and aspirates are unacceptable for Xpert Xpress SARS-CoV-2/FLU/RSV testing.  Fact Sheet for Patients: BloggerCourse.com  Fact Sheet for Healthcare Providers: SeriousBroker.it  This test is not yet approved or cleared by the [place] FDA and has been authorized for detection and/or diagnosis of SARS-CoV-2 by FDA under an Emergency Use Authorization (EUA). This EUA will remain in effect (meaning this test can be used) for the duration of the COVID-19 declaration under Section 564(b)(1) of the Act, 21 U.S.C. section 360bbb-3(b)(1), unless the authorization is terminated or revoked.     Resp Syncytial Virus by PCR NEGATIVE NEGATIVE Final    Comment: (NOTE) Fact Sheet for Patients: BloggerCourse.com  Fact Sheet for Healthcare Providers: SeriousBroker.it  This test is not yet approved or cleared by the [place] FDA and has been authorized for detection and/or diagnosis of SARS-CoV-2 by FDA under an Emergency Use Authorization (EUA). This EUA will remain in effect (meaning this test can be used) for the duration of the COVID-19 declaration under Section 564(b)(1) of the Act, 21 U.S.C. section 360bbb-3(b)(1), unless the authorization  is terminated or revoked.  Performed at [facility], [address] [place], [place] [address]   Urine Culture     Status: Abnormal   Collection Time: 05/13/23  3:00 PM   Specimen: Urine, Suprapubic  Result Value Ref Range Status   Specimen Description   Final    URINE, SUPRAPUBIC Performed at [facility], [address], [place], [place] [address]    Special Requests   Final    NONE Reflexed from [id number] Performed at [facility], [address], [place], [place] [address]    Culture >=100,000 COLONIES/mL ENTEROCOCCUS FAECALIS (A)  Final   Report Status 05/15/2023 FINAL  Final    Organism ID, Bacteria ENTEROCOCCUS FAECALIS (A)  Final      Susceptibility   Enterococcus faecalis - MIC*    AMPICILLIN <=2 SENSITIVE Sensitive     NITROFURANTOIN <=16 SENSITIVE Sensitive     VANCOMYCIN 1 SENSITIVE Sensitive     * >=100,000 COLONIES/mL ENTEROCOCCUS FAECALIS  Culture, blood (Routine X 2) w Reflex to ID Panel     Status: None (Preliminary result)   Collection Time: 05/15/23  4:10 AM   Specimen: BLOOD  Result Value Ref Range Status   Specimen Description   Final    BLOOD RIGHT ANTECUBITAL Performed at [facility], [address], [place], [place] [address]    Special Requests   Final    BOTTLES DRAWN AEROBIC AND ANAEROBIC Blood Culture results may not be optimal due to an inadequate volume of blood received in culture bottles Performed at [facility], [address] [place], [place] [address]    Culture   Final    NO GROWTH 3 DAYS Performed at [facility], [address], [place], [place] [address]    Report Status PENDING  Incomplete  Culture, blood (Routine X 2) w Reflex to ID Panel     Status: None (Preliminary result)   Collection Time: 05/15/23  4:10 AM   Specimen: BLOOD RIGHT FOREARM  Result Value Ref Range Status   Specimen Description   Final    BLOOD RIGHT FOREARM Performed at [facility], [address], [place], [place] [address]    Special Requests   Final    BOTTLES DRAWN AEROBIC AND ANAEROBIC Blood Culture results may not be optimal due to an inadequate volume of blood received in culture bottles Performed at [facility], [address], [place], [place] [address]    Culture   Final    NO GROWTH 3 DAYS Performed at [facility], [address] [place], [place] [address]    Report Status PENDING  Incomplete    Labs: CBC: Recent Labs  Lab 05/13/23 1206 05/13/23 1751 05/14/23 0436 05/15/23 0410 05/17/23 0430 05/18/23 0953  WBC 18.5* 15.5* 12.0*  7.9 6.2 8.2  NEUTROABS 14.8*  --   --   --   --   --   HGB 13.1 11.9* 11.4* 11.0* 9.8* 12.1*  HCT 41.4 37.4* 36.5* 35.3* 32.3* 39.6  MCV 90.0 89.0 90.1 90.3 92.8 91.0  PLT 284 254 234 262 283 452*   Basic Metabolic Panel: Recent Labs  Lab 05/13/23 1206 05/13/23 1751 05/14/23 0436 05/15/23 0410 05/17/23 0430 05/18/23 0953  NA 134*  --  132* 137 138 140  K 3.5  --  3.3* 3.5 4.0 3.7  CL 98  --  100 108 109 108  CO2 21*  --  22 19* 20* 21*  GLUCOSE 117*  --  109* 94 112* 182*  BUN 21  --  24* 21 21 16   CREATININE 1.63* 1.64* 1.58* 1.42* 1.15 1.37*  CALCIUM 9.1  --  8.3* 8.3* 8.5* 9.1  MG  --   --  2.3 2.2  --   --   PHOS  --   --  2.9 2.1*  --   --    Liver Function Tests: Recent Labs  Lab 05/13/23 1206 05/14/23 0436  AST 22 17  ALT 13 10  ALKPHOS 79 62  BILITOT 0.8 0.9  PROT 7.8 6.5  ALBUMIN 3.7 2.8*   CBG: Recent Labs  Lab 05/15/23 1754 05/15/23 2022 05/16/23 0741 05/16/23 1138 05/18/23 0820  GLUCAP 90 137* 96 177* 100*    Discharge time spent: {LESS THAN/GREATER THAN:26388} 30 minutes.  Signed: Tobey Grim, MD [facility] 05/18/2023

## 2023-05-19 ENCOUNTER — Other Ambulatory Visit (HOSPITAL_COMMUNITY): Payer: Self-pay

## 2023-05-19 DIAGNOSIS — R7881 Bacteremia: Secondary | ICD-10-CM | POA: Diagnosis not present

## 2023-05-19 DIAGNOSIS — B952 Enterococcus as the cause of diseases classified elsewhere: Secondary | ICD-10-CM | POA: Diagnosis not present

## 2023-05-19 LAB — HEPATITIS B SURFACE ANTIBODY, QUANTITATIVE: Hep B S AB Quant (Post): <3.5 m[IU]/mL — ABNORMAL LOW

## 2023-05-19 MED ORDER — AMOXICILLIN 500 MG PO CAPS
1000.0000 mg | ORAL_CAPSULE | Freq: Two times a day (BID) | ORAL | 0 refills | Status: AC
Start: 2023-05-19 — End: 2023-05-30
  Filled 2023-05-19: qty 44, 11d supply, fill #0

## 2023-05-19 NOTE — Progress Notes (Signed)
 Discharge meds delivered to bedside D Isaiah Serge RN

## 2023-05-19 NOTE — Hospital Course (Addendum)
 Brief Narrative:    89 y.o. male PMH significant for hypertension, hypercholesterolemia, dementia, CAD, asthma, chronic suprapubic catheter, history of prostate cancer presented in the ED with complaints of fever and foul-smelling urine for last 2 days.  Patient has suprapubic catheter and which has been changed every month.  Patient also reports having some shortness of breath associated with fever and cough.  Patient denies any abdominal pain, nausea, vomiting and recent travel or sick contacts. Came to ED with same and admitted, found to have enterococcal UTI with positive blood cultures for the same.  ID following.  Blood cultures with no growth today.  Patient is now on p.o. antibiotics, last day 4/5.  Medically cleared for discharge.  Assessment & Plan:  Principal Problem:   Enterococcal bacteremia Active Problems:   AKI on CKD-3A   Prostate cancer metastatic to multiple sites [facility])   Physical deconditioning   Essential hypertension   Dyslipidemia, goal LDL below 70   Sepsis (HCC)   Enterococcus faecalis infection    Sepsis secondary to UTI,  Enterococcus faecalis bacteremia  Sepsis has resolved.  Cultures remain negative, echocardiogram shows normal EF.  Suprapubic catheter was exchanged on 3/19.  ID consulted now antibiotics have been transitioned to p.o. amoxicillin, EOT 4/25   History of prostate cancer with resultant suprapubic catheter: Patient follows with urology and suprapubic catheter is changed every month. Suprapubic catheter was changed in the ER on 03/19 Will resume monthly changes on DC.    AKI on CKD stage IIIb: Resolved.  Creatinine around baseline 1.3   Anemia: -Anemia of chronic disease with baseline hemoglobin 12.1.  No transfusions required during this hospitalization   Essential hypertension: Continue amlodipine 10 mg daily   Physical deconditioning: PT/OT-home health, face-to-face completed   Dementia/Depression At his baseline.  Forgetful but  oriented. Continue Namenda 10 mg daily Continue Remeron 15 mg daily   Asthma: Not an active exacerbation.  Bronchodilators  DVT prophylaxis: heparin injection 5,000 Units Start: 05/13/23 2200    Code Status: Limited: Do not attempt resuscitation (DNR) -DNR-LIMITED -Do Not Intubate/DNI  Family Communication:   Status is: Inpatient Remains inpatient appropriate because: Discharge today    Subjective:  Feeling well no complaints.  Wants to go home  Examination:  General exam: Appears calm and comfortable  Respiratory system: Clear to auscultation. Respiratory effort normal. Cardiovascular system: S1 & S2 heard, RRR. No JVD, murmurs, rubs, gallops or clicks. No pedal edema. Gastrointestinal system: Abdomen is nondistended, soft and nontender. No organomegaly or masses felt. Normal bowel sounds heard. Central nervous system: Alert and oriented. No focal neurological deficits. Extremities: Symmetric 5 x 5 power. Skin: No rashes, lesions or ulcers Psychiatry: Judgement and insight appear normal. Mood & affect appropriate.

## 2023-05-19 NOTE — Progress Notes (Signed)
 This RN confirmed w/ inpt pharmacy in person that there were no home meds in the inpt  pharmacy for patient. PIV removed as noted. Gold DNR sheet placed in d/c envelope- waiting on pt's daughter to arrive to review AVS w/ her.

## 2023-05-19 NOTE — TOC Transition Note (Signed)
 Transition of Care [facility]) - Discharge Note   Patient Details  Name: Andre Casey. MRN: [id number] Date of Birth: February 26, 1929  Transition of Care [facility]) CM/SW Contact:  Jessie Foot, RN Phone Number: 05/19/2023, 5:06 PM   Clinical Narrative:    Patient discharge home with [facility] for [facility] PT/OT.   Final next level of care: Home w Home Health Services Barriers to Discharge: No Barriers Identified   Patient Goals and CMS Choice Patient states their goals for this hospitalization and ongoing recovery are:: return home CMS Medicare.gov Compare Post Acute Care list provided to::  (NA) Choice offered to / list presented to : NA [place] ownership interest in [facility]provided to::  (NA)    Discharge Placement                    Patient and family notified of of transfer: 05/19/23  Discharge Plan and Services Additional resources added to the After Visit Summary for   In-house Referral: NA              DME Arranged: N/A DME Agency: NA       HH Arranged: PT, OT HH Agency: [facility] Date [facility] Agency Contacted: 05/15/23 Time HH Agency Contacted: [id number] Representative spoke with at [facility] Agency: Elnita Maxwell  Social Drivers of Health (SDOH) Interventions SDOH Screenings   Food Insecurity: Unknown (05/13/2023)  Housing: Unknown (05/13/2023)  Transportation Needs: No Transportation Needs (05/13/2023)  Utilities: Not At Risk (05/13/2023)  Social Connections: Moderately Isolated (05/13/2023)  Tobacco Use: Medium Risk (05/13/2023)     Readmission Risk Interventions    05/15/2023    1:54 PM 05/14/2023    9:29 AM  Readmission Risk Prevention Plan  Transportation Screening Complete Complete  PCP or Specialist Appt within 3-5 Days Complete   HRI or Home Care Consult Complete   Social Work Consult for Recovery Care Planning/Counseling Complete   Palliative Care Screening Not Applicable   Medication Review [employer]) Complete Complete   PCP or Specialist appointment within 3-5 days of discharge  Complete  HRI or Home Care Consult  Complete  SW Recovery Care/Counseling Consult  Complete  Palliative Care Screening  Not Applicable  Skilled Nursing Facility  Complete

## 2023-05-19 NOTE — Discharge Summary (Signed)
 Physician Discharge Summary  Andre Casey. ZOX:[id number] DOB: November 12, 1929 DOA: 05/13/2023  PCP: Lupita Raider, MD  Admit date: 05/13/2023 Discharge date: 05/19/2023  Admitted From: Home Disposition: Home  Recommendations for Outpatient Follow-up:  Follow up with PCP in 1-2 weeks Please obtain BMP/CBC in one week your next doctors visit.  Oral amoxicillin until 4/5  Discharge Condition: Stable CODE STATUS: DNR Diet recommendation: Cardiac  Brief/Interim Summary: Brief Narrative:    89 y.o. male PMH significant for hypertension, hypercholesterolemia, dementia, CAD, asthma, chronic suprapubic catheter, history of prostate cancer presented in the ED with complaints of fever and foul-smelling urine for last 2 days.  Patient has suprapubic catheter and which has been changed every month.  Patient also reports having some shortness of breath associated with fever and cough.  Patient denies any abdominal pain, nausea, vomiting and recent travel or sick contacts. Came to ED with same and admitted, found to have enterococcal UTI with positive blood cultures for the same.  ID following.  Blood cultures with no growth today.  Patient is now on p.o. antibiotics, last day 4/5.  Medically cleared for discharge.  Assessment & Plan:  Principal Problem:   Enterococcal bacteremia Active Problems:   AKI on CKD-3A   Prostate cancer metastatic to multiple sites [facility])   Physical deconditioning   Essential hypertension   Dyslipidemia, goal LDL below 70   Sepsis (HCC)   Enterococcus faecalis infection    Sepsis secondary to UTI,  Enterococcus faecalis bacteremia  Sepsis has resolved.  Cultures remain negative, echocardiogram shows normal EF.  Suprapubic catheter was exchanged on 3/19.  ID consulted now antibiotics have been transitioned to p.o. amoxicillin, EOT 4/25   History of prostate cancer with resultant suprapubic catheter: Patient follows with urology and suprapubic catheter is  changed every month. Suprapubic catheter was changed in the ER on 03/19 Will resume monthly changes on DC.    AKI on CKD stage IIIb: Resolved.  Creatinine around baseline 1.3   Anemia: -Anemia of chronic disease with baseline hemoglobin 12.1.  No transfusions required during this hospitalization   Essential hypertension: Continue amlodipine 10 mg daily   Physical deconditioning: PT/OT-home health, face-to-face completed   Dementia/Depression At his baseline.  Forgetful but oriented. Continue Namenda 10 mg daily Continue Remeron 15 mg daily   Asthma: Not an active exacerbation.  Bronchodilators  DVT prophylaxis: heparin injection 5,000 Units Start: 05/13/23 2200    Code Status: Limited: Do not attempt resuscitation (DNR) -DNR-LIMITED -Do Not Intubate/DNI  Family Communication:   Status is: Inpatient Remains inpatient appropriate because: Discharge today    Subjective:  Feeling well no complaints.  Wants to go home  Examination:  General exam: Appears calm and comfortable  Respiratory system: Clear to auscultation. Respiratory effort normal. Cardiovascular system: S1 & S2 heard, RRR. No JVD, murmurs, rubs, gallops or clicks. No pedal edema. Gastrointestinal system: Abdomen is nondistended, soft and nontender. No organomegaly or masses felt. Normal bowel sounds heard. Central nervous system: Alert and oriented. No focal neurological deficits. Extremities: Symmetric 5 x 5 power. Skin: No rashes, lesions or ulcers Psychiatry: Judgement and insight appear normal. Mood & affect appropriate.    Discharge Diagnoses:  Principal Problem:   Enterococcal bacteremia Active Problems:   AKI on CKD-3A   Prostate cancer metastatic to multiple sites [facility])   Physical deconditioning   Essential hypertension   Dyslipidemia, goal LDL below 70   Sepsis (HCC)   Enterococcus faecalis infection      Discharge Exam: Vitals:  05/19/23 0107 05/19/23 0656  BP: 115/66 138/82   Pulse: 68 66  Resp: 17 18  Temp: 98 F (36.7 C) 98.1 F (36.7 C)  SpO2: 97% 98%   Vitals:   05/18/23 2013 05/18/23 2039 05/19/23 0107 05/19/23 0656  BP:  (!) 146/75 115/66 138/82  Pulse:  80 68 66  Resp:  15 17 18   Temp:  98.2 F (36.8 C) 98 F (36.7 C) 98.1 F (36.7 C)  TempSrc:   Oral Oral  SpO2: 97% 99% 97% 98%  Weight:      Height:          Discharge Instructions  Discharge Instructions     meds to beds pharmacy consult ([facility] ONLY)   Complete by: As directed       Allergies as of 05/19/2023       Reactions   Sertraline Diarrhea   Simvastatin Diarrhea   Aricept [donepezil] Nausea Only   Report upset stomach - unable to tolerate.   Namzaric [memantine Hcl-donepezil Hcl] Other (See Comments)   Stomach upset  Pt able to take Memantine by itself.   Wellbutrin [bupropion] Anxiety        Medication List     TAKE these medications    acetaminophen 500 MG tablet Commonly known as: TYLENOL Take 2 tablets (1,000 mg total) by mouth 3 (three) times daily. What changed:  how much to take when to take this   amLODipine 10 MG tablet Commonly known as: NORVASC Take 10 mg by mouth at bedtime.   amoxicillin 500 MG capsule Commonly known as: AMOXIL Take 2 capsules (1,000 mg total) by mouth every 12 (twelve) hours for 11 days.   aspirin EC 81 MG tablet Take 1 tablet (81 mg total) by mouth daily. Swallow whole. What changed: when to take this   feeding supplement Liqd Take 237 mLs by mouth 2 (two) times daily between meals.   ipratropium-albuterol 0.5-2.5 (3) MG/3ML Soln Commonly known as: DUONEB Take 3 mLs by nebulization 2 (two) times daily at 10 am and 4 pm.   memantine 10 MG tablet Commonly known as: Namenda Take 1 tablet (10 mg total) by mouth 2 (two) times daily.   mirtazapine 15 MG tablet Commonly known as: REMERON Take 15 mg by mouth at bedtime.   multivitamin with minerals Tabs tablet Take 1 tablet by mouth daily.   PROBIOTIC  PO Take 1 tablet by mouth daily with lunch.   traMADol 50 MG tablet Commonly known as: ULTRAM Take 1 tablet (50 mg total) by mouth every 12 (twelve) hours as needed for up to 4 doses. What changed: when to take this        Follow-up Information     [facility] Follow up.   Why: [facility] will follow up with you at discharge to provide home health services Contact information: [address] [place] [address] [phone]                Allergies  Allergen Reactions   Sertraline Diarrhea   Simvastatin Diarrhea   Aricept [Donepezil] Nausea Only    Report upset stomach - unable to tolerate.   Namzaric [Memantine Hcl-Donepezil Hcl] Other (See Comments)    Stomach upset  Pt able to take Memantine by itself.   Wellbutrin [Bupropion] Anxiety    You were cared for by a hospitalist during your hospital stay. If you have any questions about your discharge medications or the care you received while you were in the hospital after  you are discharged, you can call the unit and asked to speak with the hospitalist on call if the hospitalist that took care of you is not available. Once you are discharged, your primary care physician will handle any further medical issues. Please note that no refills for any discharge medications will be authorized once you are discharged, as it is imperative that you return to your primary care physician (or establish a relationship with a primary care physician if you do not have one) for your aftercare needs so that they can reassess your need for medications and monitor your lab values.  You were cared for by a hospitalist during your hospital stay. If you have any questions about your discharge medications or the care you received while you were in the hospital after you are discharged, you can call the unit and asked to speak with the hospitalist on call if the hospitalist that took care of you is not available. Once you are discharged, your primary  care physician will handle any further medical issues. Please note that NO REFILLS for any discharge medications will be authorized once you are discharged, as it is imperative that you return to your primary care physician (or establish a relationship with a primary care physician if you do not have one) for your aftercare needs so that they can reassess your need for medications and monitor your lab values.  Please request your Prim.MD to go over all Hospital Tests and Procedure/Radiological results at the follow up, please get all Hospital records sent to your Prim MD by signing hospital release before you go home.  Get CBC, CMP, 2 view Chest X ray checked  by Primary MD during your next visit or SNF MD in 5-7 days ( we routinely change or add medications that can affect your baseline labs and fluid status, therefore we recommend that you get the mentioned basic workup next visit with your PCP, your PCP may decide not to get them or add new tests based on their clinical decision)  On your next visit with your primary care physician please Get Medicines reviewed and adjusted.  If you experience worsening of your admission symptoms, develop shortness of breath, life threatening emergency, suicidal or homicidal thoughts you must seek medical attention immediately by calling 911 or calling your MD immediately  if symptoms less severe.  You Must read complete instructions/literature along with all the possible adverse reactions/side effects for all the Medicines you take and that have been prescribed to you. Take any new Medicines after you have completely understood and accpet all the possible adverse reactions/side effects.   Do not drive, operate heavy machinery, perform activities at heights, swimming or participation in water activities or provide baby sitting services if your were admitted for syncope or siezures until you have seen by Primary MD or a Neurologist and advised to do so again.  Do not  drive when taking Pain medications.   Procedures/Studies: ECHOCARDIOGRAM COMPLETE Result Date: 05/15/2023    ECHOCARDIOGRAM REPORT   Patient Name:   Andre Casey. Date of Exam: 05/15/2023 Medical Rec #:  [id number]                Height:       67.0 in Accession #:    [id number]               Weight:       121.3 lb Date of Birth:  1929-07-30  BSA:          1.635 m Patient Age:    89 years                 BP:           115/54 mmHg Patient Gender: M                        HR:           77 bpm. Exam Location:  Inpatient Procedure: 2D Echo, Cardiac Doppler, Color Doppler and Intracardiac            Opacification Agent (Both Spectral and Color Flow Doppler were            utilized during procedure). Indications:    Bacteremia  History:        Patient has prior history of Echocardiogram examinations, most                 recent 01/26/2023. CAD, Arrythmias:LBBB; Risk                 Factors:Hypertension and Dyslipidemia.  Sonographer:    Vern Claude Referring Phys: 1914 CORNELIUS N VAN DAM  Sonographer Comments: Technically difficult study due to poor echo windows. Image acquisition challenging due to respiratory motion. IMPRESSIONS  1. Very technically difficult study. Unable to evaluate valves for endocarditis.  2. Left ventricular ejection fraction, by estimation, is 65 to 70%. Left ventricular ejection fraction by PLAX is 67 %. The left ventricle has normal function. The left ventricle has no regional wall motion abnormalities. Left ventricular diastolic parameters are consistent with Grade I diastolic dysfunction (impaired relaxation).  3. Right ventricular systolic function was not well visualized. The right ventricular size is not well visualized. There is normal pulmonary artery systolic pressure. The estimated right ventricular systolic pressure is 32.8 mmHg.  4. The mitral valve was not well visualized. No evidence of mitral valve regurgitation.  5. The aortic valve was not well  visualized. Aortic valve regurgitation is not visualized. Comparison(s): Changes from prior study are noted. 01/26/2023: LVEF 60-65%. Conclusion(s)/Recommendation(s): If suspicion for endocarditis is high, consider TEE, as this study could now allow evaluation of the valves for endocarditis. FINDINGS  Left Ventricle: Left ventricular ejection fraction, by estimation, is 65 to 70%. Left ventricular ejection fraction by PLAX is 67 %. The left ventricle has normal function. The left ventricle has no regional wall motion abnormalities. Definity contrast agent was given IV to delineate the left ventricular endocardial borders. The left ventricular internal cavity size was normal in size. There is no left ventricular hypertrophy. Abnormal (paradoxical) septal motion, consistent with left bundle branch block. Left ventricular diastolic parameters are consistent with Grade I diastolic dysfunction (impaired relaxation). Indeterminate filling pressures. Right Ventricle: The right ventricular size is not well visualized. Right vetricular wall thickness was not well visualized. Right ventricular systolic function was not well visualized. There is normal pulmonary artery systolic pressure. The tricuspid regurgitant velocity is 2.73 m/s, and with an assumed right atrial pressure of 3 mmHg, the estimated right ventricular systolic pressure is 32.8 mmHg. Left Atrium: Left atrial size was not well visualized. Right Atrium: Right atrial size was not well visualized. Pericardium: The pericardium was not well visualized. Mitral Valve: The mitral valve was not well visualized. No evidence of mitral valve regurgitation. MV peak gradient, 4.1 mmHg. The mean mitral valve gradient is 1.0 mmHg. Tricuspid Valve: The tricuspid valve is not well visualized.  Tricuspid valve regurgitation is trivial. Aortic Valve: The aortic valve was not well visualized. Aortic valve regurgitation is not visualized. Aortic valve mean gradient measures 4.0 mmHg.  Aortic valve peak gradient measures 7.3 mmHg. Aortic valve area, by VTI measures 2.35 cm. Pulmonic Valve: The pulmonic valve was not well visualized. Pulmonic valve regurgitation is not visualized. Aorta: The aortic root was not well visualized. IAS/Shunts: The interatrial septum was not assessed.  LEFT VENTRICLE PLAX 2D LV EF:         Left            Diastology                ventricular     LV e' medial:    5.44 cm/s                ejection        LV E/e' medial:  8.8                fraction by     LV e' lateral:   8.95 cm/s                PLAX is 67      LV E/e' lateral: 5.4                %. LVIDd:         3.60 cm LVIDs:         2.30 cm LV PW:         0.70 cm LV IVS:        0.80 cm LVOT diam:     2.00 cm LV SV:         50 LV SV Index:   31 LVOT Area:     3.14 cm  LV Volumes (MOD) LV vol d, MOD    110.0 ml A4C: LV vol s, MOD    20.6 ml A4C: LV SV MOD A4C:   110.0 ml RIGHT VENTRICLE RV Basal diam:  3.10 cm RV Mid diam:    2.50 cm LEFT ATRIUM             Index        RIGHT ATRIUM          Index LA diam:        3.20 cm 1.96 cm/m   RA Area:     7.00 cm LA Vol (A2C):   31.2 ml 19.08 ml/m  RA Volume:   9.81 ml  6.00 ml/m LA Vol (A4C):   31.9 ml 19.51 ml/m LA Biplane Vol: 31.9 ml 19.51 ml/m  AORTIC VALVE                    PULMONIC VALVE AV Area (Vmax):    2.44 cm     PV Vmax:       0.79 m/s AV Area (Vmean):   2.59 cm     PV Peak grad:  2.5 mmHg AV Area (VTI):     2.35 cm AV Vmax:           135.00 cm/s AV Vmean:          86.600 cm/s AV VTI:            0.214 m AV Peak Grad:      7.3 mmHg AV Mean Grad:      4.0 mmHg LVOT Vmax:         105.00 cm/s LVOT Vmean:  71.300 cm/s LVOT VTI:          0.160 m LVOT/AV VTI ratio: 0.75  AORTA Ao Root diam: 3.10 cm Ao Asc diam:  3.00 cm MITRAL VALVE               TRICUSPID VALVE MV Area (PHT): 3.93 cm    TR Peak grad:   29.8 mmHg MV Area VTI:   2.63 cm    TR Vmax:        273.00 cm/s MV Peak grad:  4.1 mmHg MV Mean grad:  1.0 mmHg    SHUNTS MV Vmax:       1.01 m/s     Systemic VTI:  0.16 m MV Vmean:      55.4 cm/s   Systemic Diam: 2.00 cm MV Decel Time: 193 msec MV E velocity: 48.00 cm/s MV A velocity: 96.70 cm/s MV E/A ratio:  0.50 Zoila Shutter MD Electronically signed by Zoila Shutter MD Signature Date/Time: 05/15/2023/4:14:49 PM    Final    DG Chest Port 1 View Result Date: 05/13/2023 CLINICAL DATA:  Questionable sepsis - evaluate for abnormality. Fever. Dementia. EXAM: PORTABLE CHEST 1 VIEW COMPARISON:  04/08/2023. FINDINGS: Bilateral lung fields are clear. Bilateral costophrenic angles are clear. Normal cardio-mediastinal silhouette. No acute osseous abnormalities. Right reverse shoulder arthroplasty noted. The soft tissues are within normal limits. IMPRESSION: No active disease. Electronically Signed   By: Jules Schick M.D.   On: 05/13/2023 14:39     The results of significant diagnostics from this hospitalization (including imaging, microbiology, ancillary and laboratory) are listed below for reference.     Microbiology: Recent Results (from the past 240 hours)  Blood Culture (routine x 2)     Status: None   Collection Time: 05/13/23 12:06 PM   Specimen: BLOOD  Result Value Ref Range Status   Specimen Description   Final    BLOOD SITE NOT SPECIFIED Performed at [facility], [address], [place], [place] [address]    Special Requests   Final    BOTTLES DRAWN AEROBIC AND ANAEROBIC Blood Culture results may not be optimal due to an inadequate volume of blood received in culture bottles Performed at [facility], [address], [place], [place] [address]    Culture   Final    NO GROWTH 5 DAYS Performed at [facility], [address] [place], [place] [address]    Report Status 05/18/2023 FINAL  Final  Blood Culture (routine x 2)     Status: Abnormal   Collection Time: 05/13/23 12:16 PM   Specimen: BLOOD  Result Value Ref Range Status   Specimen Description   Final    BLOOD SITE NOT  SPECIFIED Performed at [facility], [address], [place], [place] [address]    Special Requests   Final    BOTTLES DRAWN AEROBIC AND ANAEROBIC Blood Culture results may not be optimal due to an inadequate volume of blood received in culture bottles Performed at [facility], [address] [place], [place] [address]    Culture  Setup Time   Final    GRAM POSITIVE COCCI IN CHAINS ANAEROBIC BOTTLE ONLY CRITICAL RESULT CALLED TO, READ BACK BY AND VERIFIED WITH: PHARMD Sydney D on 032025 @1026  by SM    Culture (A)  Final    ENTEROCOCCUS FAECALIS SUSCEPTIBILITIES TO FOLLOW Performed at [facility], [address], [place], [place] [address]    Report  Status 05/16/2023 FINAL  Final   Organism ID, Bacteria ENTEROCOCCUS FAECALIS  Final      Susceptibility   Enterococcus faecalis - MIC*    AMPICILLIN <=2 SENSITIVE Sensitive     VANCOMYCIN 1 SENSITIVE Sensitive     GENTAMICIN SYNERGY SENSITIVE Sensitive     * ENTEROCOCCUS FAECALIS  Blood Culture ID Panel (Reflexed)     Status: Abnormal   Collection Time: 05/13/23 12:16 PM  Result Value Ref Range Status   Enterococcus faecalis DETECTED (A) NOT DETECTED Final    Comment: CRITICAL RESULT CALLED TO, READ BACK BY AND VERIFIED WITH: PHARMD Sydney D on 032025 @1026  by SM    Enterococcus Faecium NOT DETECTED NOT DETECTED Final   Listeria monocytogenes NOT DETECTED NOT DETECTED Final   Staphylococcus species NOT DETECTED NOT DETECTED Final   Staphylococcus aureus (BCID) NOT DETECTED NOT DETECTED Final   Staphylococcus epidermidis NOT DETECTED NOT DETECTED Final   Staphylococcus lugdunensis NOT DETECTED NOT DETECTED Final   Streptococcus species NOT DETECTED NOT DETECTED Final   Streptococcus agalactiae NOT DETECTED NOT DETECTED Final   Streptococcus pneumoniae NOT DETECTED NOT DETECTED Final   Streptococcus pyogenes NOT DETECTED NOT DETECTED Final   A.calcoaceticus-baumannii NOT DETECTED NOT  DETECTED Final   Bacteroides fragilis NOT DETECTED NOT DETECTED Final   Enterobacterales NOT DETECTED NOT DETECTED Final   Enterobacter cloacae complex NOT DETECTED NOT DETECTED Final   Escherichia coli NOT DETECTED NOT DETECTED Final   Klebsiella aerogenes NOT DETECTED NOT DETECTED Final   Klebsiella oxytoca NOT DETECTED NOT DETECTED Final   Klebsiella pneumoniae NOT DETECTED NOT DETECTED Final   Proteus species NOT DETECTED NOT DETECTED Final   Salmonella species NOT DETECTED NOT DETECTED Final   Serratia marcescens NOT DETECTED NOT DETECTED Final   Haemophilus influenzae NOT DETECTED NOT DETECTED Final   Neisseria meningitidis NOT DETECTED NOT DETECTED Final   Pseudomonas aeruginosa NOT DETECTED NOT DETECTED Final   Stenotrophomonas maltophilia NOT DETECTED NOT DETECTED Final   Candida albicans NOT DETECTED NOT DETECTED Final   Candida auris NOT DETECTED NOT DETECTED Final   Candida glabrata NOT DETECTED NOT DETECTED Final   Candida krusei NOT DETECTED NOT DETECTED Final   Candida parapsilosis NOT DETECTED NOT DETECTED Final   Candida tropicalis NOT DETECTED NOT DETECTED Final   Cryptococcus neoformans/gattii NOT DETECTED NOT DETECTED Final   Vancomycin resistance NOT DETECTED NOT DETECTED Final    Comment: Performed at [facility], [address], [place], [place] [address]  Resp panel by RT-PCR (RSV, Flu A&B, Covid)     Status: None   Collection Time: 05/13/23 12:20 PM   Specimen: Nasal Swab  Result Value Ref Range Status   SARS Coronavirus 2 by RT PCR NEGATIVE NEGATIVE Final    Comment: (NOTE) SARS-CoV-2 target nucleic acids are NOT DETECTED.  The SARS-CoV-2 RNA is generally detectable in upper respiratory specimens during the acute phase of infection. The lowest concentration of SARS-CoV-2 viral copies this assay can detect is 138 copies/mL. A negative result does not preclude SARS-Cov-2 infection and should not be used as the sole basis for treatment or other  patient management decisions. A negative result may occur with  improper specimen collection/handling, submission of specimen other than nasopharyngeal swab, presence of viral mutation(s) within the areas targeted by this assay, and inadequate number of viral copies(<138 copies/mL). A negative result must be combined with clinical observations, patient history, and epidemiological information. The expected result is Negative.  Fact Sheet for Patients:  BloggerCourse.com  Fact Sheet for Healthcare Providers:  SeriousBroker.it  This test is no t yet approved or cleared by the [place] FDA and  has been authorized for detection and/or diagnosis of SARS-CoV-2 by FDA under an Emergency Use Authorization (EUA). This EUA will remain  in effect (meaning this test can be used) for the duration of the COVID-19 declaration under Section 564(b)(1) of the Act, 21 U.S.C.section 360bbb-3(b)(1), unless the authorization is terminated  or revoked sooner.       Influenza A by PCR NEGATIVE NEGATIVE Final   Influenza B by PCR NEGATIVE NEGATIVE Final    Comment: (NOTE) The Xpert Xpress SARS-CoV-2/FLU/RSV plus assay is intended as an aid in the diagnosis of influenza from Nasopharyngeal swab specimens and should not be used as a sole basis for treatment. Nasal washings and aspirates are unacceptable for Xpert Xpress SARS-CoV-2/FLU/RSV testing.  Fact Sheet for Patients: BloggerCourse.com  Fact Sheet for Healthcare Providers: SeriousBroker.it  This test is not yet approved or cleared by the [place] FDA and has been authorized for detection and/or diagnosis of SARS-CoV-2 by FDA under an Emergency Use Authorization (EUA). This EUA will remain in effect (meaning this test can be used) for the duration of the COVID-19 declaration under Section 564(b)(1) of the Act, 21 U.S.C. section  360bbb-3(b)(1), unless the authorization is terminated or revoked.     Resp Syncytial Virus by PCR NEGATIVE NEGATIVE Final    Comment: (NOTE) Fact Sheet for Patients: BloggerCourse.com  Fact Sheet for Healthcare Providers: SeriousBroker.it  This test is not yet approved or cleared by the [place] FDA and has been authorized for detection and/or diagnosis of SARS-CoV-2 by FDA under an Emergency Use Authorization (EUA). This EUA will remain in effect (meaning this test can be used) for the duration of the COVID-19 declaration under Section 564(b)(1) of the Act, 21 U.S.C. section 360bbb-3(b)(1), unless the authorization is terminated or revoked.  Performed at [facility], [address], [place], [place] [address]   Urine Culture     Status: Abnormal   Collection Time: 05/13/23  3:00 PM   Specimen: Urine, Suprapubic  Result Value Ref Range Status   Specimen Description   Final    URINE, SUPRAPUBIC Performed at [facility], [address] [place], [place] [address]    Special Requests   Final    NONE Reflexed from [id number] Performed at [facility], [address], [place], [place] [address]    Culture >=100,000 COLONIES/mL ENTEROCOCCUS FAECALIS (A)  Final   Report Status 05/15/2023 FINAL  Final   Organism ID, Bacteria ENTEROCOCCUS FAECALIS (A)  Final      Susceptibility   Enterococcus faecalis - MIC*    AMPICILLIN <=2 SENSITIVE Sensitive     NITROFURANTOIN <=16 SENSITIVE Sensitive     VANCOMYCIN 1 SENSITIVE Sensitive     * >=100,000 COLONIES/mL ENTEROCOCCUS FAECALIS  Culture, blood (Routine X 2) w Reflex to ID Panel     Status: None (Preliminary result)   Collection Time: 05/15/23  4:10 AM   Specimen: BLOOD  Result Value Ref Range Status   Specimen Description   Final    BLOOD RIGHT ANTECUBITAL Performed at [facility], [address], [place],  [place] [address]    Special Requests   Final    BOTTLES DRAWN AEROBIC AND ANAEROBIC Blood Culture results may not be optimal due to an inadequate volume of blood received in culture bottles Performed at [facility], [address], [place], [place] [address]    Culture   Final    NO GROWTH 4 DAYS Performed at [facility], [address] [place], [place] [address]    Report Status PENDING  Incomplete  Culture, blood (Routine X 2) w Reflex to ID Panel     Status: None (Preliminary result)   Collection Time: 05/15/23  4:10 AM   Specimen: BLOOD RIGHT FOREARM  Result Value Ref Range Status   Specimen Description   Final    BLOOD RIGHT FOREARM Performed at [facility], [address], [place], [place] [address]    Special Requests   Final    BOTTLES DRAWN AEROBIC AND ANAEROBIC Blood Culture results may not be optimal due to an inadequate volume of blood received in culture bottles Performed at [facility], [address] [place], [place] [address]    Culture   Final    NO GROWTH 4 DAYS Performed at [facility], [address], [place], [place] [address]    Report Status PENDING  Incomplete     Labs: BNP (last 3 results) No results for input(s): "BNP" in the last 8760 hours. Basic Metabolic Panel: Recent Labs  Lab 05/13/23 1206 05/13/23 1751 05/14/23 0436 05/15/23 0410 05/17/23 0430 05/18/23 0953  NA 134*  --  132* 137 138 140  K 3.5  --  3.3* 3.5 4.0 3.7  CL 98  --  100 108 109 108  CO2 21*  --  22 19* 20* 21*  GLUCOSE 117*  --  109* 94 112* 182*  BUN 21  --  24* 21 21 16   CREATININE 1.63* 1.64* 1.58* 1.42* 1.15 1.37*  CALCIUM 9.1  --  8.3* 8.3* 8.5* 9.1  MG  --   --  2.3 2.2  --   --   PHOS  --   --  2.9 2.1*  --   --    Liver Function Tests: Recent Labs  Lab 05/13/23 1206 05/14/23 0436  AST 22 17  ALT 13 10  ALKPHOS 79 62  BILITOT 0.8 0.9  PROT 7.8 6.5  ALBUMIN 3.7 2.8*   No results for  input(s): "LIPASE", "AMYLASE" in the last 168 hours. No results for input(s): "AMMONIA" in the last 168 hours. CBC: Recent Labs  Lab 05/13/23 1206 05/13/23 1751 05/14/23 0436 05/15/23 0410 05/17/23 0430 05/18/23 0953  WBC 18.5* 15.5* 12.0* 7.9 6.2 8.2  NEUTROABS 14.8*  --   --   --   --   --   HGB 13.1 11.9* 11.4* 11.0* 9.8* 12.1*  HCT 41.4 37.4* 36.5* 35.3* 32.3* 39.6  MCV 90.0 89.0 90.1 90.3 92.8 91.0  PLT 284 254 234 262 283 452*   Cardiac Enzymes: No results for input(s): "CKTOTAL", "CKMB", "CKMBINDEX", "TROPONINI" in the last 168 hours. BNP: Invalid input(s): "POCBNP" CBG: Recent Labs  Lab 05/15/23 1754 05/15/23 2022 05/16/23 0741 05/16/23 1138 05/18/23 0820  GLUCAP 90 137* 96 177* 100*   D-Dimer No results for input(s): "DDIMER" in the last 72 hours. Hgb A1c No results for input(s): "HGBA1C" in the last 72 hours. Lipid Profile No results for input(s): "CHOL", "HDL", "LDLCALC", "TRIG", "CHOLHDL", "LDLDIRECT" in the last 72 hours. Thyroid function studies No results for input(s): "TSH", "T4TOTAL", "T3FREE", "THYROIDAB" in the last 72 hours.  Invalid input(s): "FREET3" Anemia work up No results for input(s): "VITAMINB12", "FOLATE", "FERRITIN", "TIBC", "IRON", "RETICCTPCT" in the last 72 hours. Urinalysis  Component Value Date/Time   COLORURINE STRAW (A) 05/13/2023 1500   APPEARANCEUR CLEAR 05/13/2023 1500   LABSPEC 1.010 05/13/2023 1500   PHURINE 7.0 05/13/2023 1500   GLUCOSEU NEGATIVE 05/13/2023 1500   HGBUR MODERATE (A) 05/13/2023 1500   BILIRUBINUR NEGATIVE 05/13/2023 1500   KETONESUR NEGATIVE 05/13/2023 1500   PROTEINUR 100 (A) 05/13/2023 1500   UROBILINOGEN 0.2 05/15/2007 1118   NITRITE NEGATIVE 05/13/2023 1500   LEUKOCYTESUR LARGE (A) 05/13/2023 1500   Sepsis Labs Recent Labs  Lab 05/14/23 0436 05/15/23 0410 05/17/23 0430 05/18/23 0953  WBC 12.0* 7.9 6.2 8.2   Microbiology Recent Results (from the past 240 hours)  Blood Culture  (routine x 2)     Status: None   Collection Time: 05/13/23 12:06 PM   Specimen: BLOOD  Result Value Ref Range Status   Specimen Description   Final    BLOOD SITE NOT SPECIFIED Performed at [facility], [address], [place], [place] [address]    Special Requests   Final    BOTTLES DRAWN AEROBIC AND ANAEROBIC Blood Culture results may not be optimal due to an inadequate volume of blood received in culture bottles Performed at [facility], [address] [place], [place] [address]    Culture   Final    NO GROWTH 5 DAYS Performed at [facility], [address] [place], [place] [address]    Report Status 05/18/2023 FINAL  Final  Blood Culture (routine x 2)     Status: Abnormal   Collection Time: 05/13/23 12:16 PM   Specimen: BLOOD  Result Value Ref Range Status   Specimen Description   Final    BLOOD SITE NOT SPECIFIED Performed at [facility], [address], [place], [place] [address]    Special Requests   Final    BOTTLES DRAWN AEROBIC AND ANAEROBIC Blood Culture results may not be optimal due to an inadequate volume of blood received in culture bottles Performed at [facility], [address] [place], [place] [address]    Culture  Setup Time   Final    GRAM POSITIVE COCCI IN CHAINS ANAEROBIC BOTTLE ONLY CRITICAL RESULT CALLED TO, READ BACK BY AND VERIFIED WITH: PHARMD Sydney D on [id number] @1026  by SM    Culture (A)  Final    ENTEROCOCCUS FAECALIS SUSCEPTIBILITIES TO FOLLOW Performed at [facility], [address], [place], [place] [address]    Report Status 05/16/2023 FINAL  Final   Organism ID, Bacteria ENTEROCOCCUS FAECALIS  Final      Susceptibility   Enterococcus faecalis - MIC*    AMPICILLIN <=2 SENSITIVE Sensitive     VANCOMYCIN 1 SENSITIVE Sensitive     GENTAMICIN SYNERGY SENSITIVE Sensitive     * ENTEROCOCCUS FAECALIS  Blood Culture ID Panel (Reflexed)     Status:  Abnormal   Collection Time: 05/13/23 12:16 PM  Result Value Ref Range Status   Enterococcus faecalis DETECTED (A) NOT DETECTED Final    Comment: CRITICAL RESULT CALLED TO, READ BACK BY AND VERIFIED WITH: PHARMD Sydney D on [id number] @1026  by SM    Enterococcus Faecium NOT DETECTED NOT DETECTED Final   Listeria monocytogenes NOT DETECTED NOT DETECTED Final   Staphylococcus species NOT DETECTED NOT DETECTED Final   Staphylococcus aureus (BCID) NOT DETECTED NOT DETECTED Final   Staphylococcus epidermidis NOT DETECTED NOT DETECTED Final   Staphylococcus lugdunensis NOT DETECTED NOT DETECTED Final   Streptococcus species NOT DETECTED  NOT DETECTED Final   Streptococcus agalactiae NOT DETECTED NOT DETECTED Final   Streptococcus pneumoniae NOT DETECTED NOT DETECTED Final   Streptococcus pyogenes NOT DETECTED NOT DETECTED Final   A.calcoaceticus-baumannii NOT DETECTED NOT DETECTED Final   Bacteroides fragilis NOT DETECTED NOT DETECTED Final   Enterobacterales NOT DETECTED NOT DETECTED Final   Enterobacter cloacae complex NOT DETECTED NOT DETECTED Final   Escherichia coli NOT DETECTED NOT DETECTED Final   Klebsiella aerogenes NOT DETECTED NOT DETECTED Final   Klebsiella oxytoca NOT DETECTED NOT DETECTED Final   Klebsiella pneumoniae NOT DETECTED NOT DETECTED Final   Proteus species NOT DETECTED NOT DETECTED Final   Salmonella species NOT DETECTED NOT DETECTED Final   Serratia marcescens NOT DETECTED NOT DETECTED Final   Haemophilus influenzae NOT DETECTED NOT DETECTED Final   Neisseria meningitidis NOT DETECTED NOT DETECTED Final   Pseudomonas aeruginosa NOT DETECTED NOT DETECTED Final   Stenotrophomonas maltophilia NOT DETECTED NOT DETECTED Final   Candida albicans NOT DETECTED NOT DETECTED Final   Candida auris NOT DETECTED NOT DETECTED Final   Candida glabrata NOT DETECTED NOT DETECTED Final   Candida krusei NOT DETECTED NOT DETECTED Final   Candida parapsilosis NOT DETECTED NOT DETECTED  Final   Candida tropicalis NOT DETECTED NOT DETECTED Final   Cryptococcus neoformans/gattii NOT DETECTED NOT DETECTED Final   Vancomycin resistance NOT DETECTED NOT DETECTED Final    Comment: Performed at [facility], [address] [place], [place] [address]  Resp panel by RT-PCR (RSV, Flu A&B, Covid)     Status: None   Collection Time: 05/13/23 12:20 PM   Specimen: Nasal Swab  Result Value Ref Range Status   SARS Coronavirus 2 by RT PCR NEGATIVE NEGATIVE Final    Comment: (NOTE) SARS-CoV-2 target nucleic acids are NOT DETECTED.  The SARS-CoV-2 RNA is generally detectable in upper respiratory specimens during the acute phase of infection. The lowest concentration of SARS-CoV-2 viral copies this assay can detect is 138 copies/mL. A negative result does not preclude SARS-Cov-2 infection and should not be used as the sole basis for treatment or other patient management decisions. A negative result may occur with  improper specimen collection/handling, submission of specimen other than nasopharyngeal swab, presence of viral mutation(s) within the areas targeted by this assay, and inadequate number of viral copies(<138 copies/mL). A negative result must be combined with clinical observations, patient history, and epidemiological information. The expected result is Negative.  Fact Sheet for Patients:  BloggerCourse.com  Fact Sheet for Healthcare Providers:  SeriousBroker.it  This test is no t yet approved or cleared by the [place] FDA and  has been authorized for detection and/or diagnosis of SARS-CoV-2 by FDA under an Emergency Use Authorization (EUA). This EUA will remain  in effect (meaning this test can be used) for the duration of the COVID-19 declaration under Section 564(b)(1) of the Act, 21 U.S.C.section 360bbb-3(b)(1), unless the authorization is terminated  or revoked sooner.       Influenza A by PCR  NEGATIVE NEGATIVE Final   Influenza B by PCR NEGATIVE NEGATIVE Final    Comment: (NOTE) The Xpert Xpress SARS-CoV-2/FLU/RSV plus assay is intended as an aid in the diagnosis of influenza from Nasopharyngeal swab specimens and should not be used as a sole basis for treatment. Nasal washings and aspirates are unacceptable for Xpert Xpress SARS-CoV-2/FLU/RSV testing.  Fact Sheet for Patients: BloggerCourse.com  Fact Sheet for Healthcare Providers: SeriousBroker.it  This test is not yet approved or cleared by the  [place] [employer] trader and has been authorized for detection and/or diagnosis of SARS-CoV-2 by FDA under an TEFL teacher (EUA). This EUA will remain in effect (meaning this test can be used) for the duration of the COVID-19 declaration under Section 564(b)(1) of the Act, 21 U.S.C. section 360bbb-3(b)(1), unless the authorization is terminated or revoked.     Resp Syncytial Virus by PCR NEGATIVE NEGATIVE Final    Comment: (NOTE) Fact Sheet for Patients: BloggerCourse.com  Fact Sheet for Healthcare Providers: SeriousBroker.it  This test is not yet approved or cleared by the [place] FDA and has been authorized for detection and/or diagnosis of SARS-CoV-2 by FDA under an Emergency Use Authorization (EUA). This EUA will remain in effect (meaning this test can be used) for the duration of the COVID-19 declaration under Section 564(b)(1) of the Act, 21 U.S.C. section 360bbb-3(b)(1), unless the authorization is terminated or revoked.  Performed at [facility], [address], [place], [place] [address]   Urine Culture     Status: Abnormal   Collection Time: 05/13/23  3:00 PM   Specimen: Urine, Suprapubic  Result Value Ref Range Status   Specimen Description   Final    URINE, SUPRAPUBIC Performed at [facility], [address],  [place], [place] [address]    Special Requests   Final    NONE Reflexed from [id number] Performed at [facility], [address] [place], [place] [address]    Culture >=100,000 COLONIES/mL ENTEROCOCCUS FAECALIS (A)  Final   Report Status 05/15/2023 FINAL  Final   Organism ID, Bacteria ENTEROCOCCUS FAECALIS (A)  Final      Susceptibility   Enterococcus faecalis - MIC*    AMPICILLIN <=2 SENSITIVE Sensitive     NITROFURANTOIN <=16 SENSITIVE Sensitive     VANCOMYCIN 1 SENSITIVE Sensitive     * >=100,000 COLONIES/mL ENTEROCOCCUS FAECALIS  Culture, blood (Routine X 2) w Reflex to ID Panel     Status: None (Preliminary result)   Collection Time: 05/15/23  4:10 AM   Specimen: BLOOD  Result Value Ref Range Status   Specimen Description   Final    BLOOD RIGHT ANTECUBITAL Performed at [facility], [address], [place], [place] [address]    Special Requests   Final    BOTTLES DRAWN AEROBIC AND ANAEROBIC Blood Culture results may not be optimal due to an inadequate volume of blood received in culture bottles Performed at [facility], [address], [place], [place] [address]    Culture   Final    NO GROWTH 4 DAYS Performed at [facility], [address] [place], [place] [address]    Report Status PENDING  Incomplete  Culture, blood (Routine X 2) w Reflex to ID Panel     Status: None (Preliminary result)   Collection Time: 05/15/23  4:10 AM   Specimen: BLOOD RIGHT FOREARM  Result Value Ref Range Status   Specimen Description   Final    BLOOD RIGHT FOREARM Performed at [facility], [address], [place], [place] [address]    Special Requests   Final    BOTTLES DRAWN AEROBIC AND ANAEROBIC Blood Culture results may not be optimal due to an inadequate volume of blood received in culture bottles Performed at [facility], [address] [place], [place] [address]    Culture   Final    NO  GROWTH 4 DAYS Performed at [facility], [address] [place], [place] [address]    Report Status PENDING  Incomplete     Time coordinating discharge:  I have spent 35 minutes face to face with the patient and on the ward discussing the patients care, assessment, plan and disposition with other care givers. >50% of the time was devoted counseling the patient about the risks and benefits of treatment/Discharge disposition and coordinating care.   SIGNED:   Miguel Rota, MD  [facility] 05/19/2023, 12:36 PM   If 7PM-7AM, please contact night-coverage

## 2023-05-19 NOTE — Plan of Care (Signed)
  Problem: Education: Goal: Knowledge of General Education information will improve Description: Including pain rating scale, medication(s)/side effects and non-pharmacologic comfort measures 05/19/2023 0729 by Donia Pounds, RN Outcome: Progressing 05/19/2023 0728 by Donia Pounds, RN Outcome: Progressing 05/19/2023 0728 by Donia Pounds, RN Outcome: Progressing   Problem: Health Behavior/Discharge Planning: Goal: Ability to manage health-related needs will improve 05/19/2023 0729 by Donia Pounds, RN Outcome: Progressing 05/19/2023 0728 by Donia Pounds, RN Outcome: Progressing 05/19/2023 0728 by Donia Pounds, RN Outcome: Progressing   Problem: Clinical Measurements: Goal: Ability to maintain clinical measurements within normal limits will improve 05/19/2023 0729 by Donia Pounds, RN Outcome: Progressing 05/19/2023 0728 by Donia Pounds, RN Outcome: Progressing 05/19/2023 0728 by Donia Pounds, RN Outcome: Progressing   Problem: Clinical Measurements: Goal: Cardiovascular complication will be avoided 05/19/2023 0729 by Donia Pounds, RN Outcome: Progressing 05/19/2023 0728 by Donia Pounds, RN Outcome: Progressing 05/19/2023 0728 by Donia Pounds, RN Outcome: Progressing   Problem: Coping: Goal: Level of anxiety will decrease 05/19/2023 0729 by Donia Pounds, RN Outcome: Progressing 05/19/2023 0728 by Donia Pounds, RN Outcome: Progressing 05/19/2023 0728 by Donia Pounds, RN Outcome: Progressing

## 2023-05-19 NOTE — Plan of Care (Signed)

## 2023-05-20 LAB — CULTURE, BLOOD (ROUTINE X 2)
Culture: NO GROWTH
Culture: NO GROWTH

## 2023-06-06 IMAGING — US US EXTREM UP *R* LTD
2 series · 15 of 20 positions shown · non-contrast
Comparison: Right wrist radiograph 07/03/2021

CLINICAL DATA: Wrist swelling, right

EXAM:
ULTRASOUND RIGHT UPPER EXTREMITY LIMITED
TECHNIQUE: Ultrasound examination of the upper extremity soft tissues was
performed in the area of clinical concern.

[Series 1: us extrem low bilat ltd mc & wl · 18 acquisitions, 13 frames shown (1 of 2)]
[im 1/18]
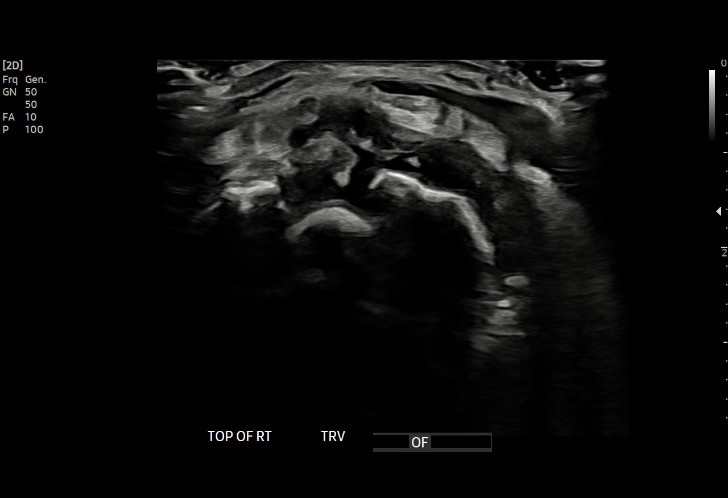
[im 3/18]
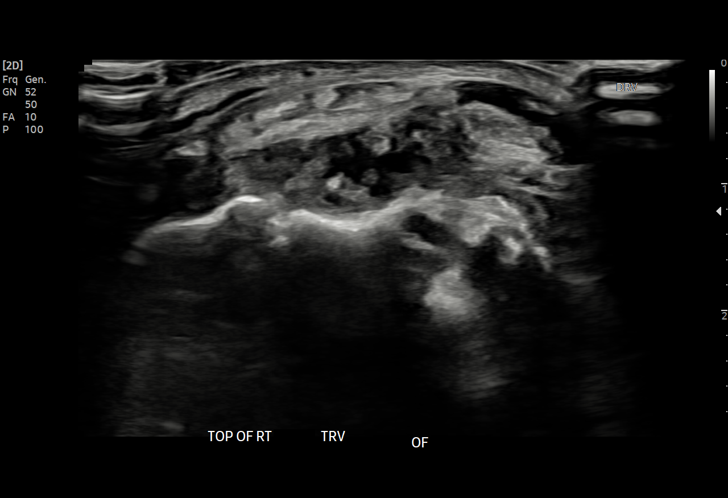
[im 4/18]
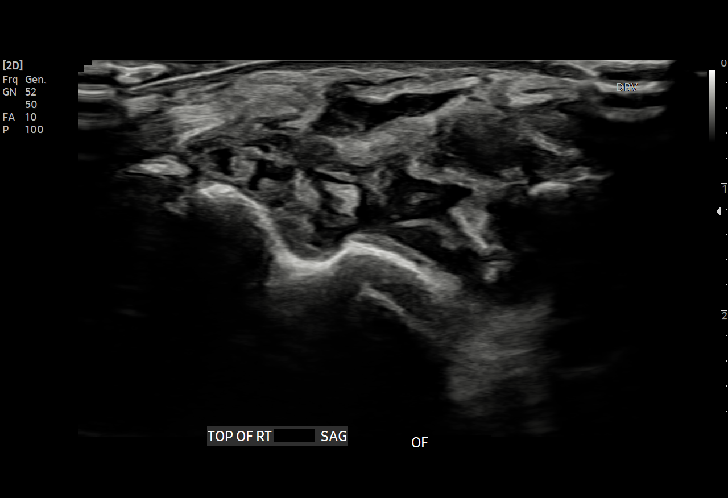
[im 5/18]
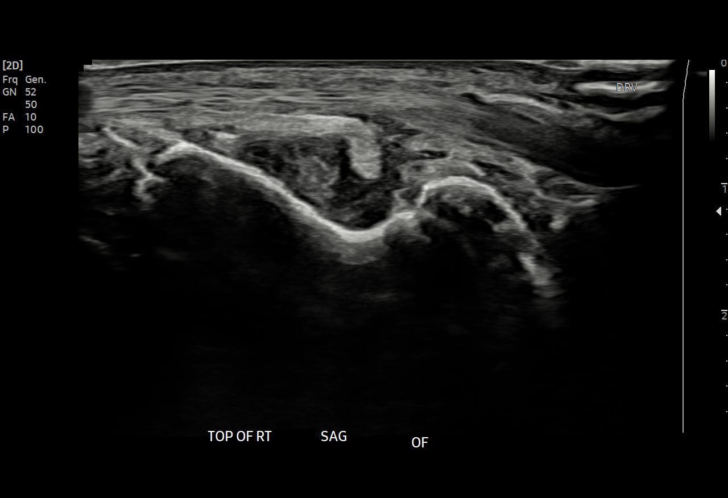
[im 7/18]
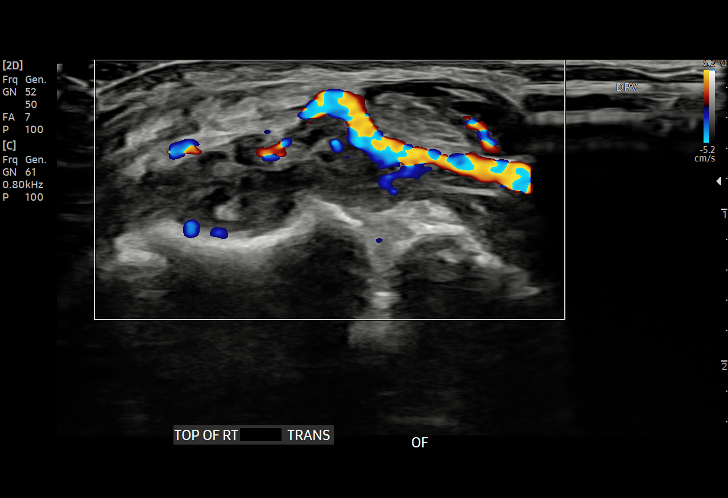
[im 8/18]
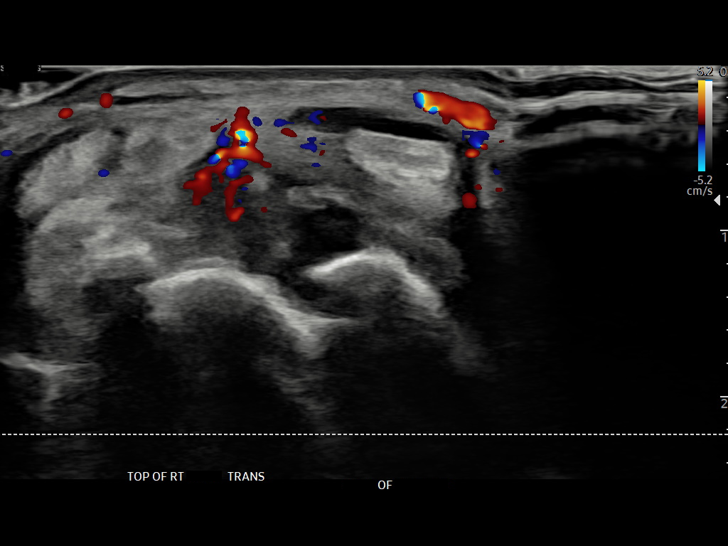
[im 9/18]
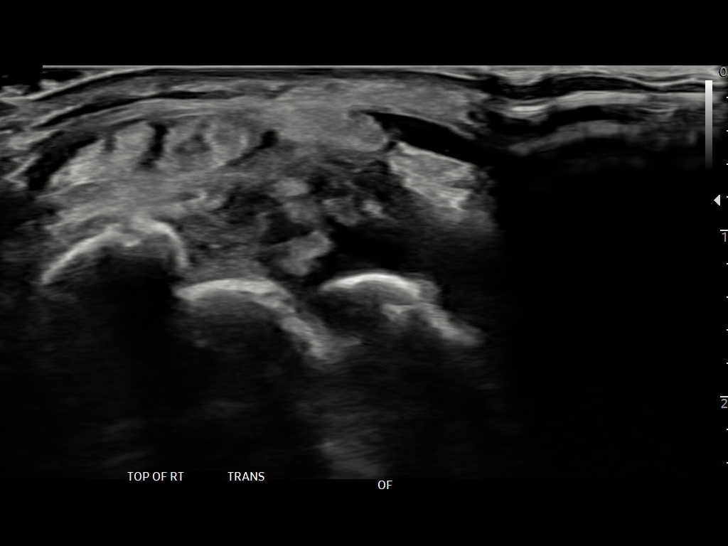
[im 11/18]
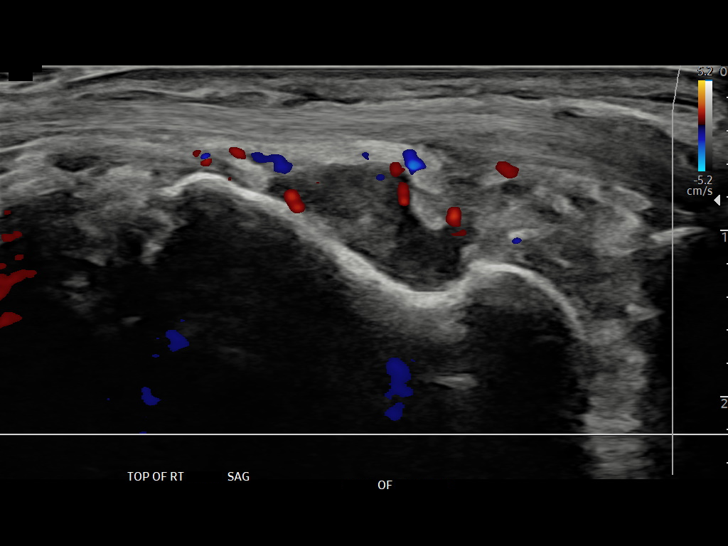
[im 12/18]
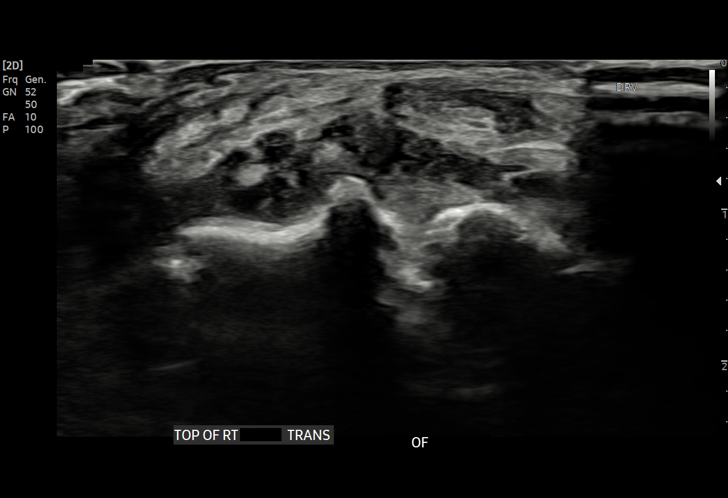
[im 13/18]
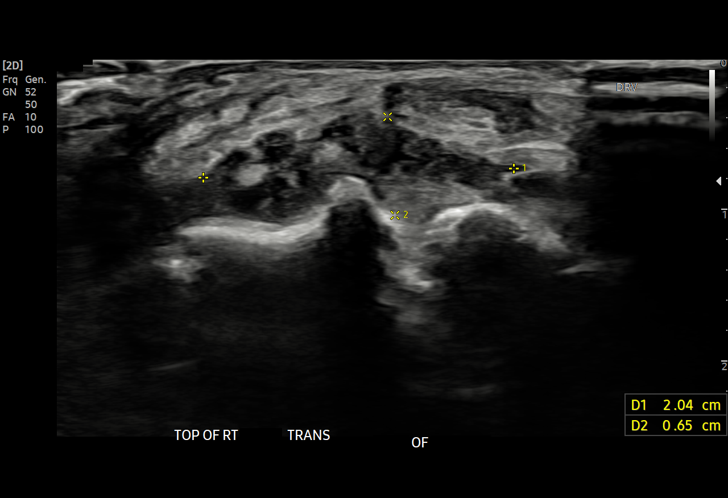
[im 15/18]
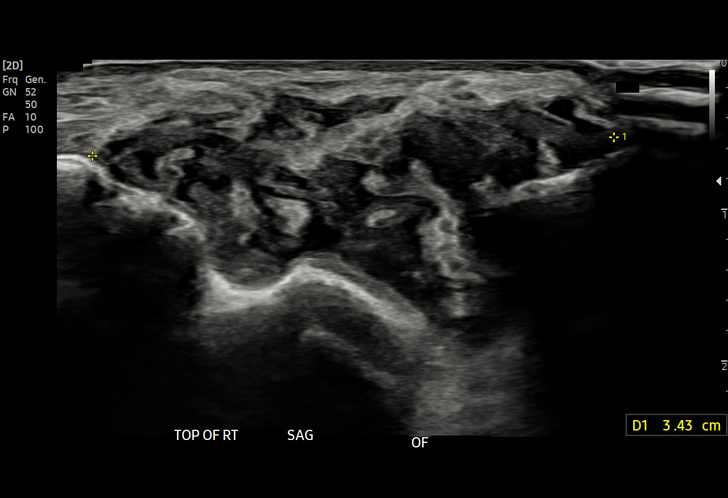
[im 16/18]
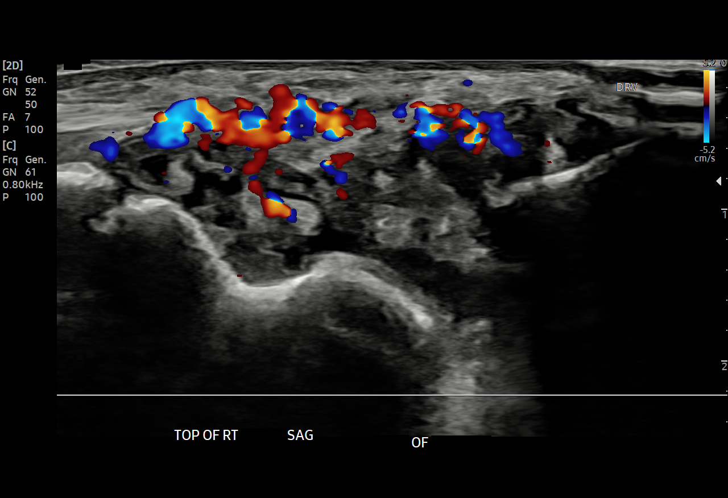
[im 17/18]
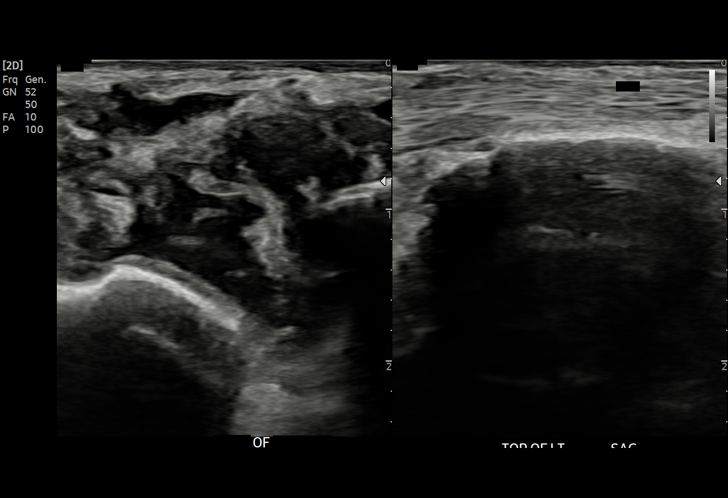

[Series 2: us extrem low bilat ltd mc & wl · 2 of 2 slices shown (2 of 2)]
[im 1/2]
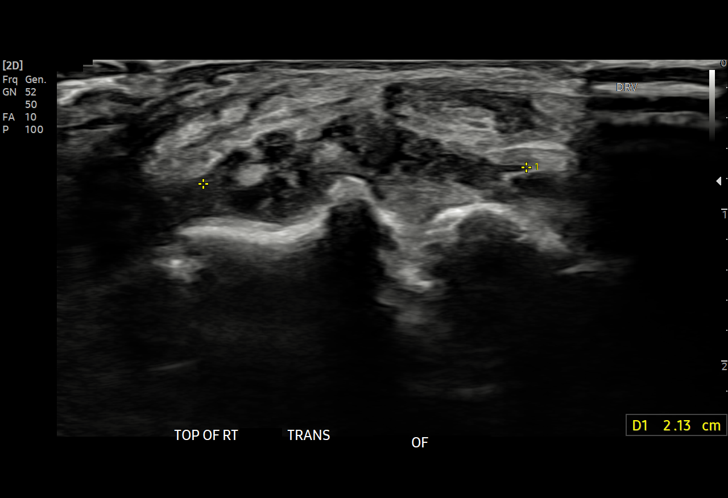
[im 2/2]
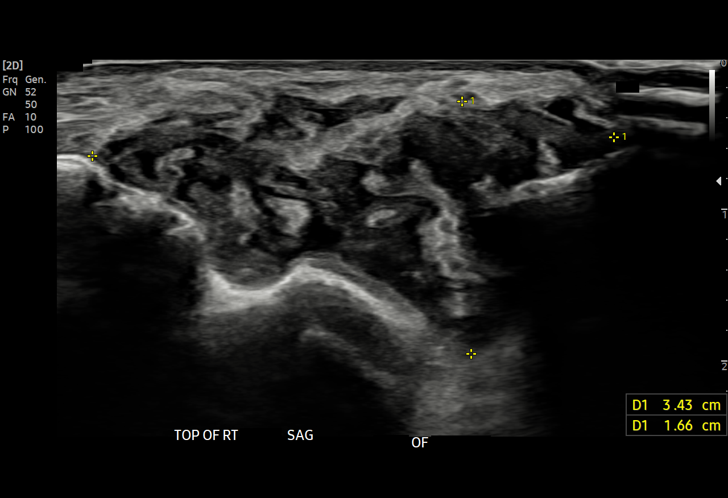

[15 of 20 positions shown; findings below may reference images not displayed]

FINDINGS: At the level of the carpus, there is a heterogeneous effusion with
increased vascularity suggesting extensive synovitis. There is a
tenosynovitis of the extensor carpi radialis brevis tendon. Along
the area of redness specifically, there is complex, and likely
non-drainable fluid with increased vascularity measuring 3.4 x 1.7 x
2.1 cm.
IMPRESSION: Severe synovitis with heterogeneous effusion throughout the wrist
and with tenosynovitis of the extensor carpi radialis brevis tendon.
Complex fluid along the area of redness measuring 3.4 x 1.7 x
cm, which also has the appearance of synovitis. Given radiographic
appearance, this favored to be an inflammatory arthritis/CPPD
arthropathy. Infection is possible but felt to be less likely.

## 2023-06-06 IMAGING — CT CT ABD-PELV W/ CM
2 of 5 series · 14 of 46 positions shown, 16 images · IV contrast (agent unspecified)
Comparison: CT 04/25/2021, additional priors.  MRI 02/15/2019

CLINICAL DATA: Intra-abdominal abscess Concern for infection around
suprapubic catheter

Abdominal pain.
EXAM:
CT ABDOMEN AND PELVIS WITH CONTRAST
TECHNIQUE: Multidetector CT imaging of the abdomen and pelvis was performed
using the standard protocol following bolus administration of
intravenous contrast.

[Series 2: axial st · axial · 0.80mm/px · z∈[+923,+1258]mm · 11 of 81 slices shown, 13 images]
[im 7/81  soft-tissue]
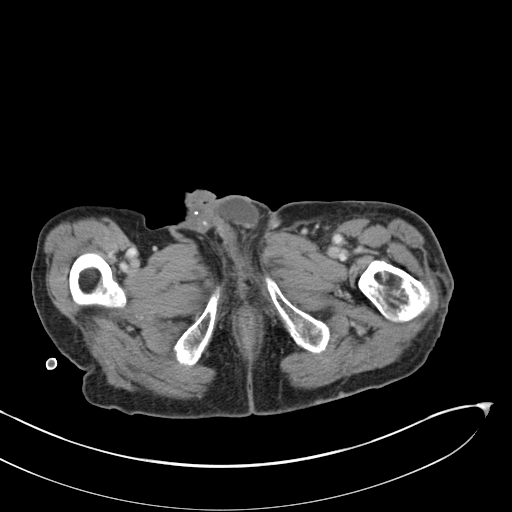
[im 7/81  bone]
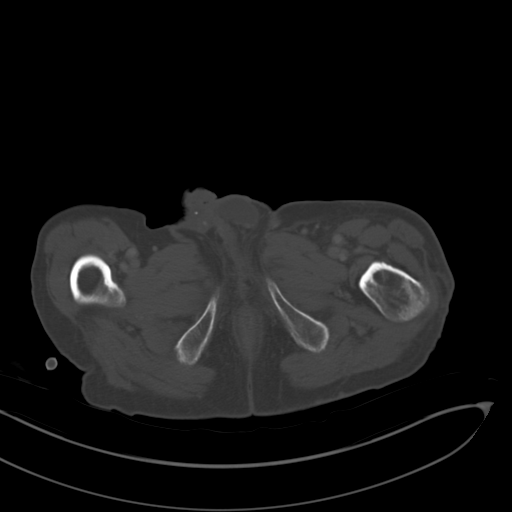
[im 13/81  soft-tissue]
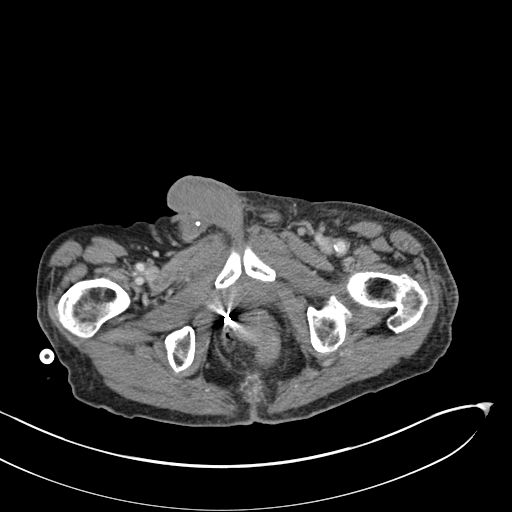
[im 19/81  soft-tissue]
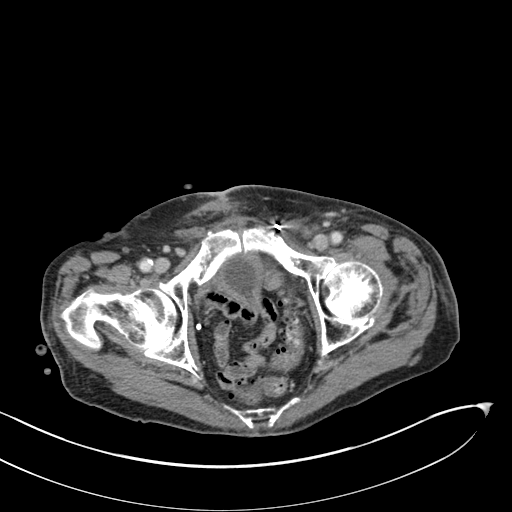
[im 25/81  soft-tissue]
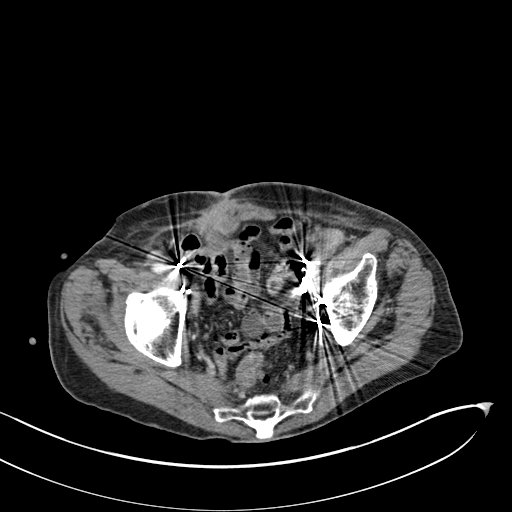
[im 31/81  soft-tissue]
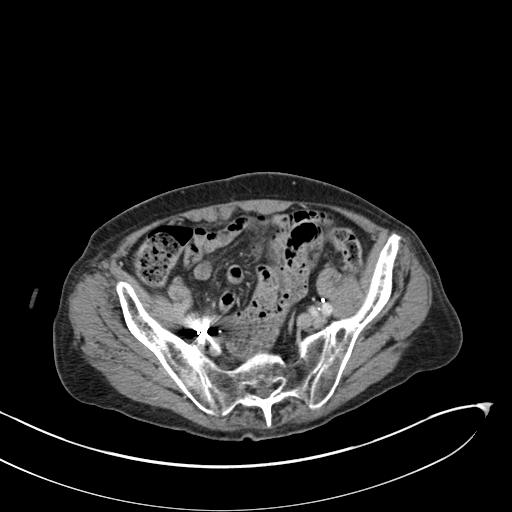
[im 44/81  soft-tissue]
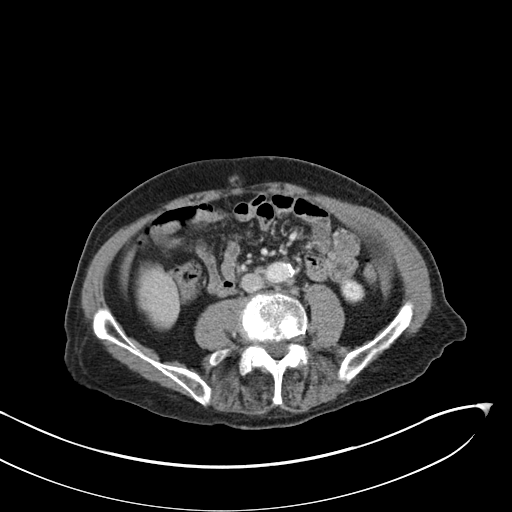
[im 50/81  soft-tissue]
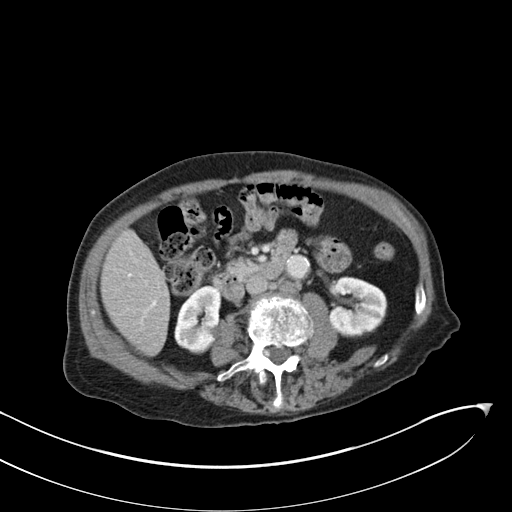
[im 56/81  soft-tissue]
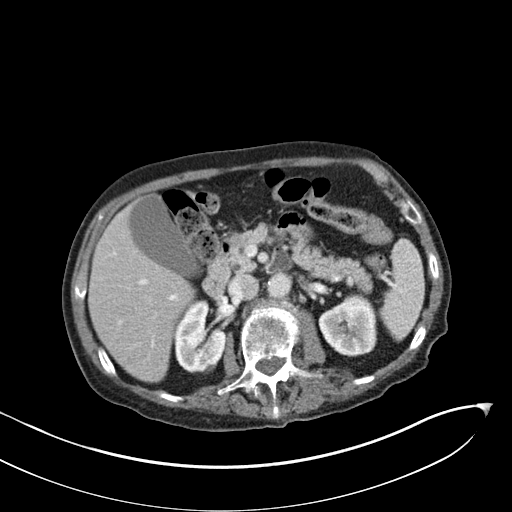
[im 62/81  soft-tissue]
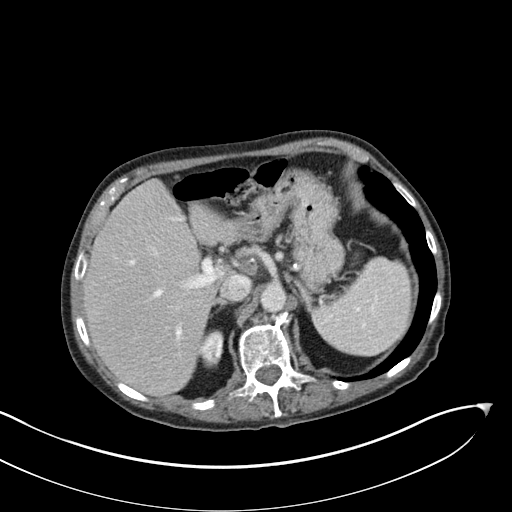
[im 62/81  bone]
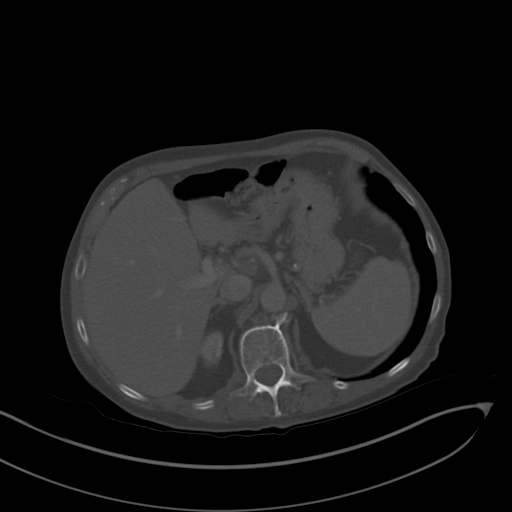
[im 68/81  soft-tissue]
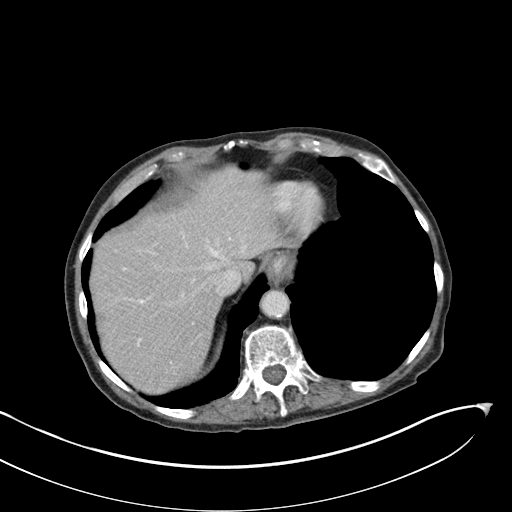
[im 74/81  soft-tissue]
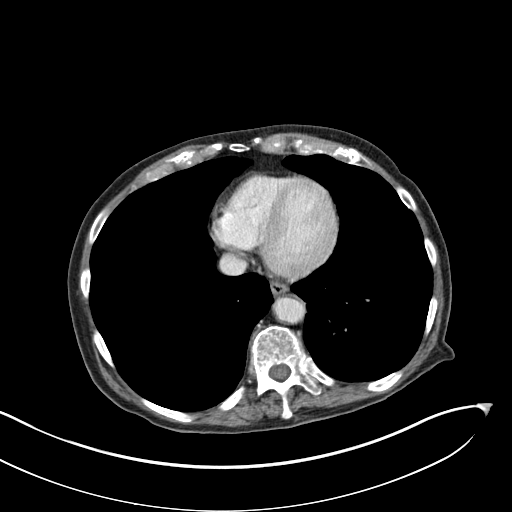

[Series 5: coronal st · coronal · 0.65mm/px · 3 of 128 slices shown]
[im 43/128  soft-tissue]
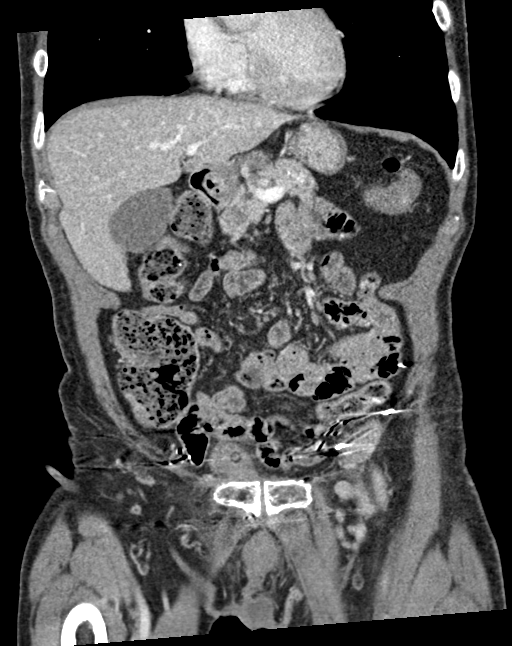
[im 57/128  soft-tissue]
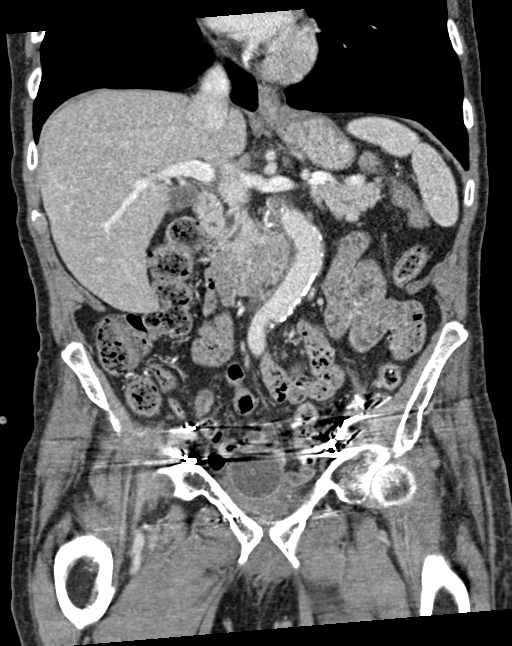
[im 71/128  soft-tissue]
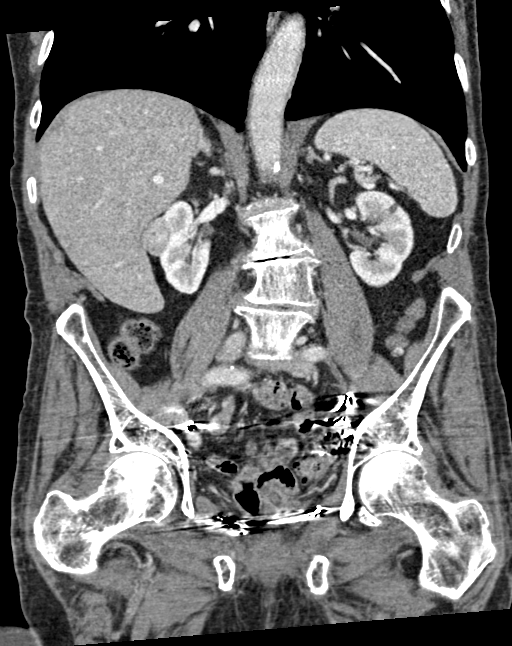

[14 of 46 positions shown; findings below may reference images not displayed]

RADIATION DOSE REDUCTION: This exam was performed according to the
departmental dose-optimization program which includes automated
exposure control, adjustment of the mA and/or kV according to
patient size and/or use of iterative reconstruction technique.

CONTRAST:  80mL OMNIPAQUE IOHEXOL 300 MG/ML  SOLN
FINDINGS: Lower chest: Emphysema. No focal airspace disease or pleural
effusion. Tiny hiatal hernia.

Hepatobiliary: No focal liver abnormality is seen. No gallstones,
gallbladder wall thickening, or biliary dilatation.

Pancreas: Again seen 13 mm hypodense lesion in the pancreatic head,
stable from prior exams, including 02/15/2019 MRI. Stable mild
pancreatic ductal dilatation. Mild parenchymal atrophy. No acute
inflammation.

Spleen: Normal in size without focal abnormality.

Adrenals/Urinary Tract: Normal adrenal glands solid enhancing renal
mass in the lateral right kidney measures 2.5 cm, series 2, image
29, 2.2 cm on 7171 MRI. There are tiny cortical hypodensities in the
kidneys are too small to characterize, likely small cysts. No
hydronephrosis. No significant perinephric edema. Homogeneous
enhancement with symmetric excretion on delayed phase imaging.

Suprapubic tube decompresses the urinary bladder. There is soft
tissue thickening along the catheter track extending from the skin
surface to the balloon. No drainable or focal fluid collection. The
bladder is collapsed around the Foley balloon.

Stomach/Bowel: Small hiatal hernia. The stomach is otherwise
unremarkable there is no small bowel obstruction or inflammation.
Colonic diverticulosis, prominent in the sigmoid colon, without
focal diverticulitis or acute colonic inflammation. Normal appendix.

Vascular/Lymphatic: Aortic atherosclerosis and tortuosity. There is
improving retroperitoneal adenopathy. Lymph node posterior to the
aorta at the level of the lower kidneys measures 12 x 20 mm, series
2, image 35, previously 18 x 28 mm. Multiple additional
retroperitoneal nodes have also diminished in size. Left external
iliac node has diminished in size, 16 mm short axis series 2, image
58, previously 21 mm. Prominent but decreasing left inguinal
adenopathy. No definite new or progressive adenopathy, streak
artifact from surgical clips partially obscures detailed pelvic
assessment.

Reproductive: Prostatectomy. Removal of prior penile prosthesis from
prior exam.

Other: No ascites or free air.

Musculoskeletal: There is a new sclerotic focus within posterior L4
vertebra suspicious for metastatic disease. Scoliosis and
degenerative change throughout the spine.
IMPRESSION: 1. Suprapubic tube in place with soft tissue thickening along the
catheter track extending from the skin surface to the balloon. No
drainable or focal fluid collection. Urinary bladder is collapsed
around the balloon.
2. Improved retroperitoneal and left inguinal adenopathy from [DATE]. New sclerotic focus within posterior L4 vertebra suspicious for
metastatic disease.
4. Unchanged solid enhancing 2.5 cm right renal mass, highly
suspicious for renal cell carcinoma.
5. Stable pancreatic head hypodensity, previously characterized on
MRI.
6. Colonic diverticulosis without acute inflammation.
7. Additional stable chronic findings as described.

Aortic Atherosclerosis (XQE7D-629.9) and Emphysema ([id number].R).

## 2023-06-06 IMAGING — DX DG WRIST COMPLETE 3+V*R*
4 series · 4 of 4 positions shown · non-contrast
Comparison: None Available.

CLINICAL DATA: Swelling

EXAM:
RIGHT WRIST - COMPLETE 3+ VIEW

[wrist ap (1 of 2)]
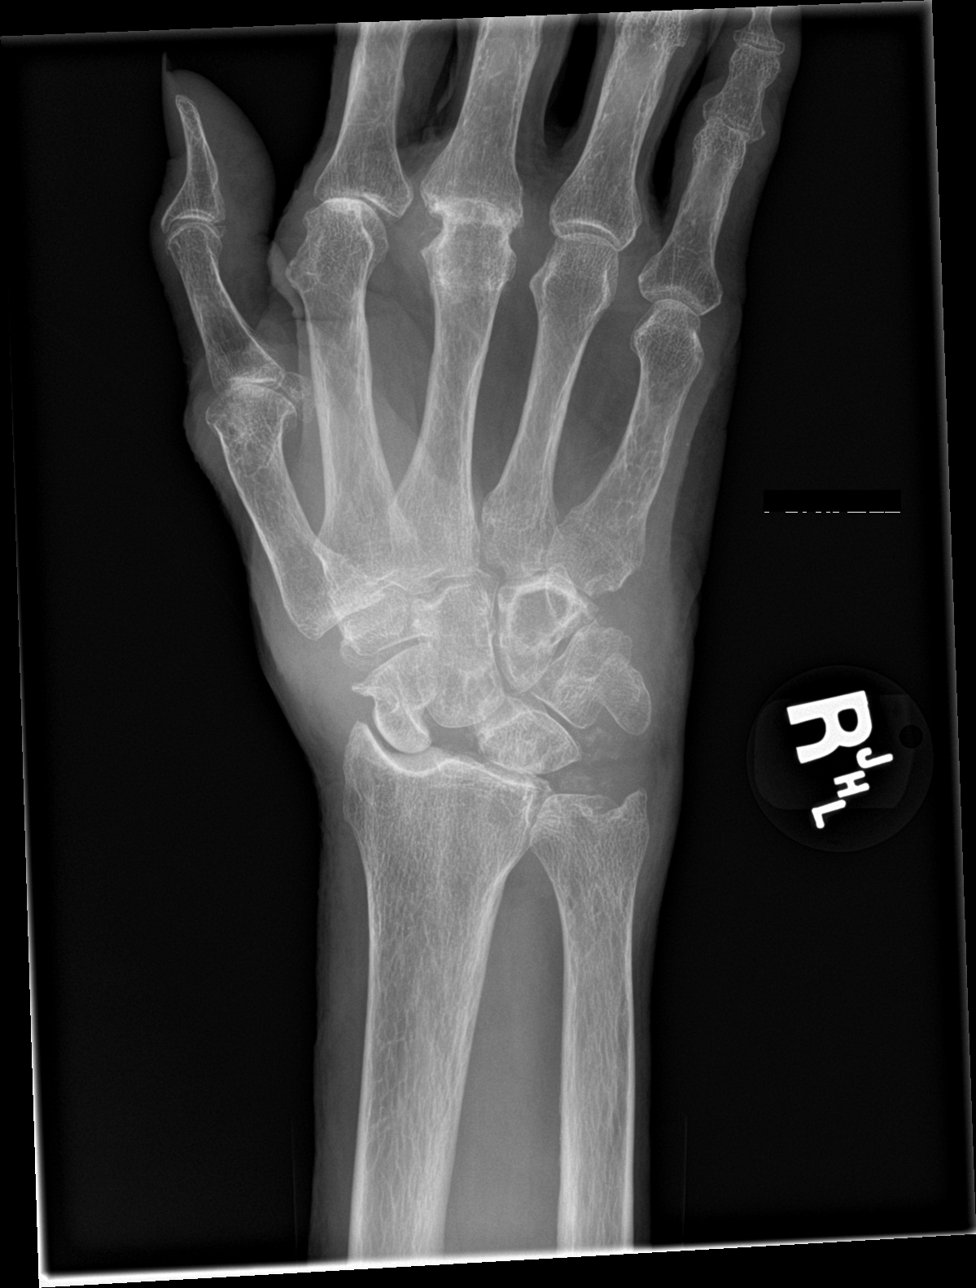

[wrist obl]
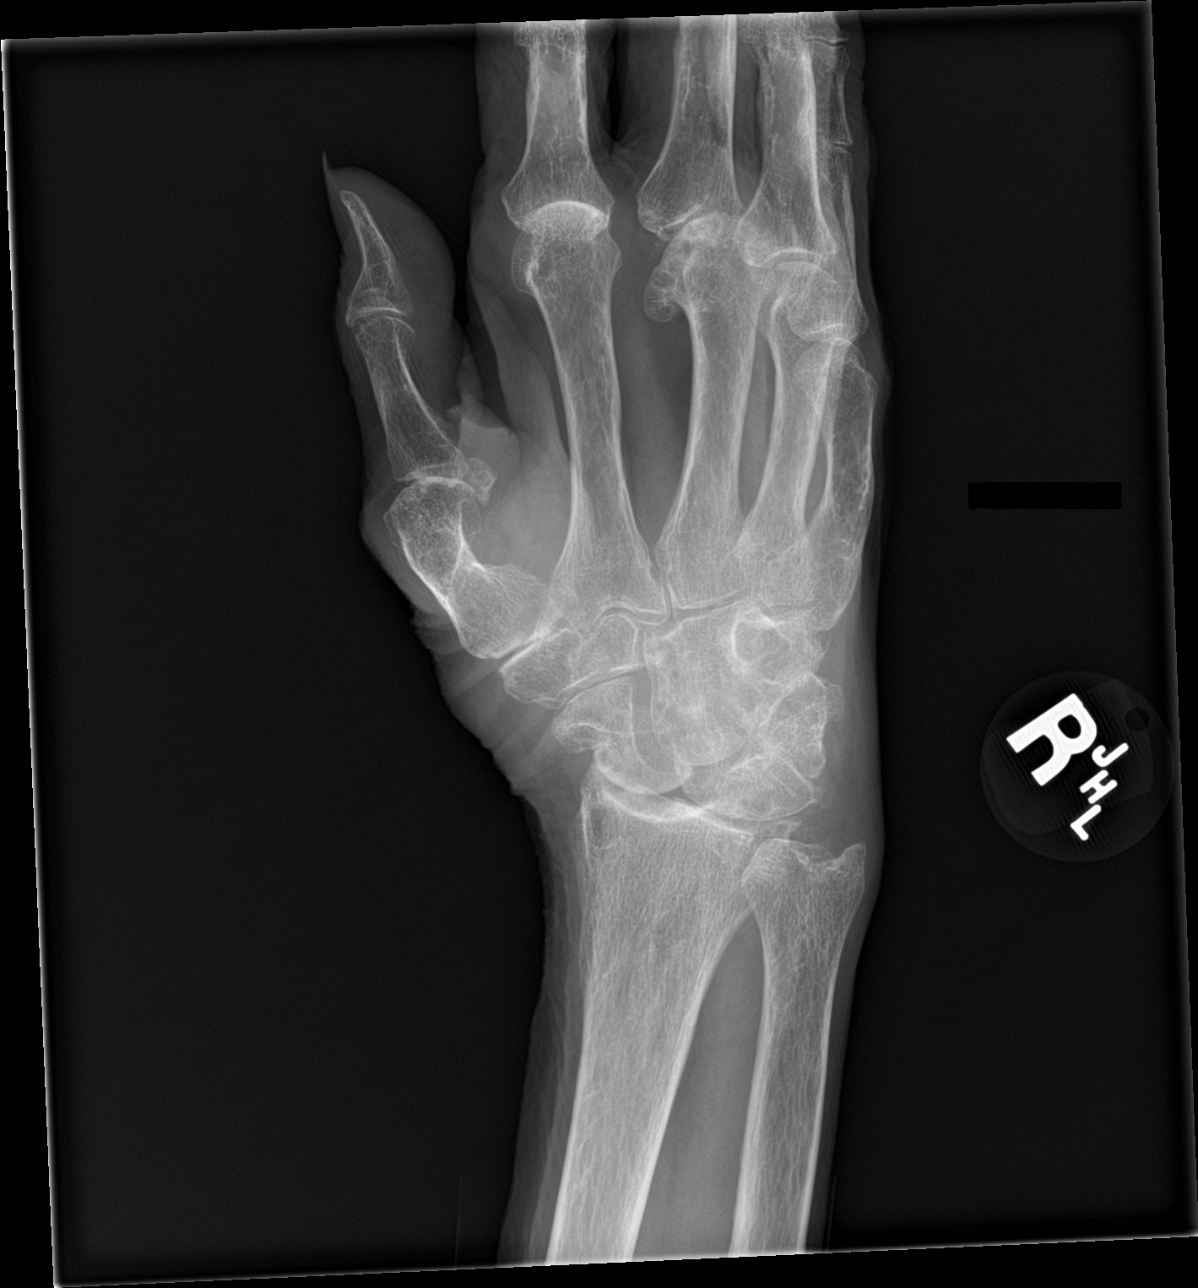

[wrist lat]
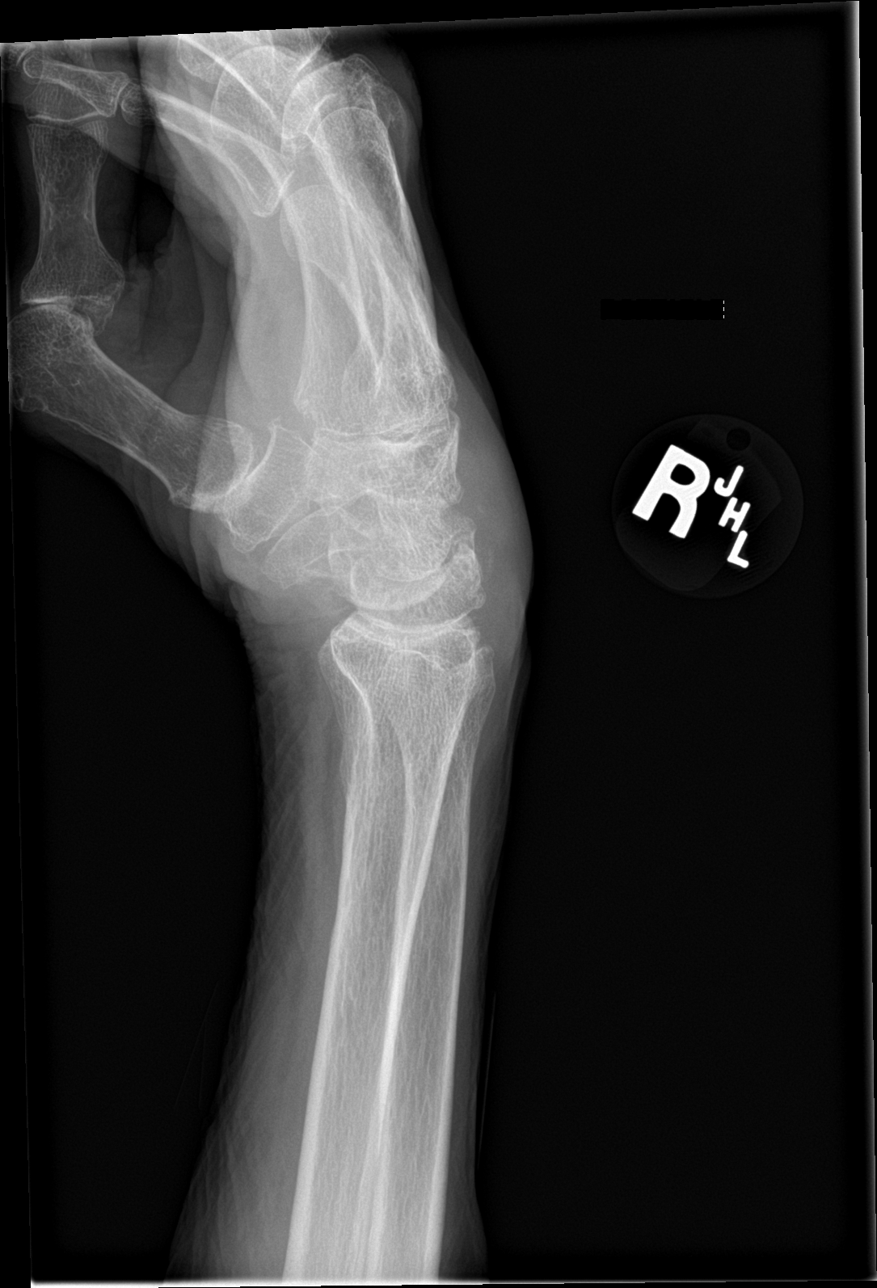

[wrist ap (2 of 2)]
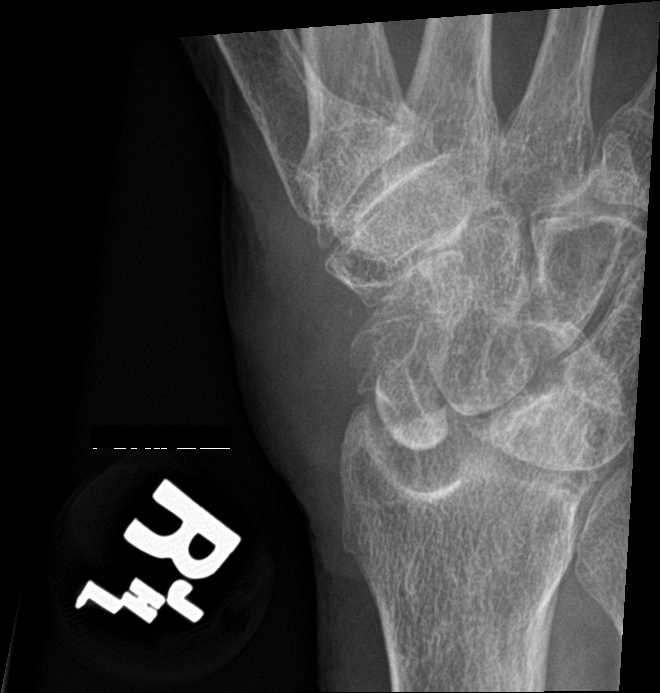

[4 of 4 positions shown; findings below may reference images not displayed]

FINDINGS: No fracture is seen. Widened scapholunate interval up to 9 mm with
proximal migration of the capitate. Prominent chondrocalcinosis.
Probable old fracture deformity of the fifth metacarpal. Moderate
arthritis at the first CMC joint. Joint space narrowing and mild
erosions at the third MCP joint. Arthritis at the radiocarpal joint.
Possible old fracture deformity of the scaphoid.
IMPRESSION: 1. Widened scapholunate interval with SLAC configuration of the
wrist.
2. Chondrocalcinosis

## 2023-07-03 ENCOUNTER — Inpatient Hospital Stay (HOSPITAL_COMMUNITY)
Admission: EM | Admit: 2023-07-03 | Discharge: 2023-07-13 | DRG: 698 | Disposition: A | Discharge status: Hospice | Attending: Internal Medicine | Admitting: Internal Medicine

## 2023-07-03 ENCOUNTER — Emergency Department (HOSPITAL_COMMUNITY)

## 2023-07-03 ENCOUNTER — Other Ambulatory Visit: Payer: Self-pay

## 2023-07-03 DIAGNOSIS — I2510 Atherosclerotic heart disease of native coronary artery without angina pectoris: Secondary | ICD-10-CM | POA: Diagnosis present

## 2023-07-03 DIAGNOSIS — N136 Pyonephrosis: Secondary | ICD-10-CM | POA: Diagnosis present

## 2023-07-03 DIAGNOSIS — Z888 Allergy status to other drugs, medicaments and biological substances status: Secondary | ICD-10-CM

## 2023-07-03 DIAGNOSIS — G893 Neoplasm related pain (acute) (chronic): Secondary | ICD-10-CM | POA: Diagnosis present

## 2023-07-03 DIAGNOSIS — N2889 Other specified disorders of kidney and ureter: Secondary | ICD-10-CM | POA: Diagnosis present

## 2023-07-03 DIAGNOSIS — Z85828 Personal history of other malignant neoplasm of skin: Secondary | ICD-10-CM

## 2023-07-03 DIAGNOSIS — Y738 Miscellaneous gastroenterology and urology devices associated with adverse incidents, not elsewhere classified: Secondary | ICD-10-CM | POA: Diagnosis present

## 2023-07-03 DIAGNOSIS — Z7982 Long term (current) use of aspirin: Secondary | ICD-10-CM

## 2023-07-03 DIAGNOSIS — T83518A Infection and inflammatory reaction due to other urinary catheter, initial encounter: Secondary | ICD-10-CM | POA: Diagnosis not present

## 2023-07-03 DIAGNOSIS — Z96611 Presence of right artificial shoulder joint: Secondary | ICD-10-CM | POA: Diagnosis present

## 2023-07-03 DIAGNOSIS — E785 Hyperlipidemia, unspecified: Secondary | ICD-10-CM | POA: Diagnosis present

## 2023-07-03 DIAGNOSIS — C7951 Secondary malignant neoplasm of bone: Secondary | ICD-10-CM | POA: Diagnosis present

## 2023-07-03 DIAGNOSIS — Z8249 Family history of ischemic heart disease and other diseases of the circulatory system: Secondary | ICD-10-CM

## 2023-07-03 DIAGNOSIS — Z87891 Personal history of nicotine dependence: Secondary | ICD-10-CM

## 2023-07-03 DIAGNOSIS — G301 Alzheimer's disease with late onset: Secondary | ICD-10-CM | POA: Diagnosis present

## 2023-07-03 DIAGNOSIS — E7800 Pure hypercholesterolemia, unspecified: Secondary | ICD-10-CM | POA: Diagnosis present

## 2023-07-03 DIAGNOSIS — Z515 Encounter for palliative care: Secondary | ICD-10-CM

## 2023-07-03 DIAGNOSIS — Z79899 Other long term (current) drug therapy: Secondary | ICD-10-CM

## 2023-07-03 DIAGNOSIS — D630 Anemia in neoplastic disease: Secondary | ICD-10-CM | POA: Diagnosis present

## 2023-07-03 DIAGNOSIS — I252 Old myocardial infarction: Secondary | ICD-10-CM

## 2023-07-03 DIAGNOSIS — Z681 Body mass index (BMI) 19 or less, adult: Secondary | ICD-10-CM

## 2023-07-03 DIAGNOSIS — Z923 Personal history of irradiation: Secondary | ICD-10-CM

## 2023-07-03 DIAGNOSIS — R109 Unspecified abdominal pain: Secondary | ICD-10-CM

## 2023-07-03 DIAGNOSIS — N179 Acute kidney failure, unspecified: Secondary | ICD-10-CM | POA: Diagnosis present

## 2023-07-03 DIAGNOSIS — N390 Urinary tract infection, site not specified: Secondary | ICD-10-CM

## 2023-07-03 DIAGNOSIS — N12 Tubulo-interstitial nephritis, not specified as acute or chronic: Secondary | ICD-10-CM | POA: Diagnosis not present

## 2023-07-03 DIAGNOSIS — E43 Unspecified severe protein-calorie malnutrition: Secondary | ICD-10-CM | POA: Diagnosis present

## 2023-07-03 DIAGNOSIS — C61 Malignant neoplasm of prostate: Secondary | ICD-10-CM | POA: Diagnosis present

## 2023-07-03 DIAGNOSIS — I700 Atherosclerosis of aorta: Secondary | ICD-10-CM | POA: Diagnosis present

## 2023-07-03 DIAGNOSIS — I10 Essential (primary) hypertension: Secondary | ICD-10-CM | POA: Diagnosis present

## 2023-07-03 DIAGNOSIS — T83510A Infection and inflammatory reaction due to cystostomy catheter, initial encounter: Secondary | ICD-10-CM | POA: Diagnosis present

## 2023-07-03 DIAGNOSIS — Z9181 History of falling: Secondary | ICD-10-CM

## 2023-07-03 DIAGNOSIS — R509 Fever, unspecified: Principal | ICD-10-CM

## 2023-07-03 DIAGNOSIS — C7902 Secondary malignant neoplasm of left kidney and renal pelvis: Secondary | ICD-10-CM | POA: Diagnosis present

## 2023-07-03 DIAGNOSIS — Z5971 Insufficient health insurance coverage: Secondary | ICD-10-CM

## 2023-07-03 DIAGNOSIS — F02B0 Dementia in other diseases classified elsewhere, moderate, without behavioral disturbance, psychotic disturbance, mood disturbance, and anxiety: Secondary | ICD-10-CM | POA: Diagnosis present

## 2023-07-03 DIAGNOSIS — Z66 Do not resuscitate: Secondary | ICD-10-CM | POA: Diagnosis present

## 2023-07-03 DIAGNOSIS — D649 Anemia, unspecified: Secondary | ICD-10-CM | POA: Diagnosis present

## 2023-07-03 DIAGNOSIS — Z955 Presence of coronary angioplasty implant and graft: Secondary | ICD-10-CM

## 2023-07-03 DIAGNOSIS — Z823 Family history of stroke: Secondary | ICD-10-CM

## 2023-07-03 DIAGNOSIS — Z825 Family history of asthma and other chronic lower respiratory diseases: Secondary | ICD-10-CM

## 2023-07-03 DIAGNOSIS — B952 Enterococcus as the cause of diseases classified elsewhere: Secondary | ICD-10-CM | POA: Diagnosis present

## 2023-07-03 LAB — CBC WITH DIFFERENTIAL/PLATELET
Abs Immature Granulocytes: 0.08 10*3/uL — ABNORMAL HIGH (ref 0.00–0.07)
Basophils Absolute: 0.1 10*3/uL (ref 0.0–0.1)
Basophils Relative: 1 %
Eosinophils Absolute: 0.2 10*3/uL (ref 0.0–0.5)
Eosinophils Relative: 2 %
HCT: 37.2 % — ABNORMAL LOW (ref 39.0–52.0)
Hemoglobin: 11.7 g/dL — ABNORMAL LOW (ref 13.0–17.0)
Immature Granulocytes: 1 %
Lymphocytes Relative: 20 %
Lymphs Abs: 2.2 10*3/uL (ref 0.7–4.0)
MCH: 27.5 pg (ref 26.0–34.0)
MCHC: 31.5 g/dL (ref 30.0–36.0)
MCV: 87.5 fL (ref 80.0–100.0)
Monocytes Absolute: 1.4 10*3/uL — ABNORMAL HIGH (ref 0.1–1.0)
Monocytes Relative: 13 %
Neutro Abs: 6.8 10*3/uL (ref 1.7–7.7)
Neutrophils Relative %: 63 %
Platelets: 473 10*3/uL — ABNORMAL HIGH (ref 150–400)
RBC: 4.25 MIL/uL (ref 4.22–5.81)
RDW: 14.5 % (ref 11.5–15.5)
WBC: 10.8 10*3/uL — ABNORMAL HIGH (ref 4.0–10.5)
nRBC: 0 % (ref 0.0–0.2)

## 2023-07-03 LAB — URINALYSIS, W/ REFLEX TO CULTURE (INFECTION SUSPECTED)
Bilirubin Urine: NEGATIVE
Glucose, UA: NEGATIVE mg/dL
Ketones, ur: NEGATIVE mg/dL
Nitrite: POSITIVE — AB
Protein, ur: 100 mg/dL — AB
Specific Gravity, Urine: 1.016 (ref 1.005–1.030)
pH: 5 (ref 5.0–8.0)

## 2023-07-03 LAB — COMPREHENSIVE METABOLIC PANEL WITH GFR
ALT: 17 U/L (ref 0–44)
AST: 22 U/L (ref 15–41)
Albumin: 3.2 g/dL — ABNORMAL LOW (ref 3.5–5.0)
Alkaline Phosphatase: 85 U/L (ref 38–126)
Anion gap: 9 (ref 5–15)
BUN: 28 mg/dL — ABNORMAL HIGH (ref 8–23)
CO2: 25 mmol/L (ref 22–32)
Calcium: 9.1 mg/dL (ref 8.9–10.3)
Chloride: 100 mmol/L (ref 98–111)
Creatinine, Ser: 1.25 mg/dL — ABNORMAL HIGH (ref 0.61–1.24)
GFR, Estimated: 54 mL/min — ABNORMAL LOW (ref >60)
Glucose, Bld: 97 mg/dL (ref 70–99)
Potassium: 3.3 mmol/L — ABNORMAL LOW (ref 3.5–5.1)
Sodium: 134 mmol/L — ABNORMAL LOW (ref 135–145)
Total Bilirubin: 0.7 mg/dL (ref 0.0–1.2)
Total Protein: 7.3 g/dL (ref 6.5–8.1)

## 2023-07-03 LAB — I-STAT CG4 LACTIC ACID, ED
Lactic Acid, Venous: 1.2 mmol/L (ref 0.5–1.9)
Lactic Acid, Venous: 1.1 mmol/L (ref 0.5–1.9)

## 2023-07-03 MED ORDER — PROBIOTIC PO TBEC: DELAYED_RELEASE_TABLET | Freq: Every day | ORAL | Status: DC

## 2023-07-03 MED ORDER — METOPROLOL TARTRATE 5 MG/5ML IV SOLN: 5.0000 mg | Freq: Four times a day (QID) | INTRAVENOUS | Status: DC | PRN

## 2023-07-03 MED ORDER — ACETAMINOPHEN 500 MG PO TABS
500.0000 mg | ORAL_TABLET | Freq: Two times a day (BID) | ORAL | Status: DC
  Administered 2023-07-03 – 2023-07-08 (×10): 500 mg via ORAL
  Filled 2023-07-03 (×10): qty 1

## 2023-07-03 MED ORDER — IPRATROPIUM-ALBUTEROL 0.5-2.5 (3) MG/3ML IN SOLN
3.0000 mL | Freq: Three times a day (TID) | RESPIRATORY_TRACT | Status: DC
  Administered 2023-07-04: 3 mL via RESPIRATORY_TRACT
  Filled 2023-07-03: qty 3

## 2023-07-03 MED ORDER — ENSURE ENLIVE PO LIQD
237.0000 mL | Freq: Two times a day (BID) | ORAL | Status: DC
  Administered 2023-07-04 – 2023-07-13 (×15): 237 mL via ORAL

## 2023-07-03 MED ORDER — ONDANSETRON HCL 4 MG PO TABS: 4.0000 mg | ORAL_TABLET | Freq: Four times a day (QID) | ORAL | Status: DC | PRN

## 2023-07-03 MED ORDER — RISAQUAD PO CAPS
1.0000 | ORAL_CAPSULE | Freq: Every day | ORAL | Status: DC
  Administered 2023-07-04 – 2023-07-13 (×10): 1 via ORAL
  Filled 2023-07-03 (×10): qty 1

## 2023-07-03 MED ORDER — IPRATROPIUM-ALBUTEROL 20-100 MCG/ACT IN AERS: 1.0000 | INHALATION_SPRAY | Freq: Three times a day (TID) | RESPIRATORY_TRACT | Status: DC

## 2023-07-03 MED ORDER — ASPIRIN 81 MG PO TBEC
81.0000 mg | DELAYED_RELEASE_TABLET | Freq: Every day | ORAL | Status: DC
Start: 2023-07-03 — End: 2023-07-13
  Administered 2023-07-03 – 2023-07-12 (×10): 81 mg via ORAL
  Filled 2023-07-03 (×10): qty 1

## 2023-07-03 MED ORDER — SODIUM CHLORIDE 0.9 % IV SOLN
1.0000 g | Freq: Once | INTRAVENOUS | Status: AC
  Administered 2023-07-03: 1 g via INTRAVENOUS
  Filled 2023-07-03: qty 10

## 2023-07-03 MED ORDER — AMLODIPINE BESYLATE 5 MG PO TABS
10.0000 mg | ORAL_TABLET | Freq: Every day | ORAL | Status: DC
  Administered 2023-07-03 – 2023-07-12 (×10): 10 mg via ORAL
  Filled 2023-07-03 (×10): qty 2

## 2023-07-03 MED ORDER — ENOXAPARIN SODIUM 30 MG/0.3ML IJ SOSY
30.0000 mg | PREFILLED_SYRINGE | INTRAMUSCULAR | Status: DC
  Administered 2023-07-04 – 2023-07-05 (×2): 30 mg via SUBCUTANEOUS
  Filled 2023-07-03 (×2): qty 0.3

## 2023-07-03 MED ORDER — ONDANSETRON HCL 4 MG/2ML IJ SOLN: 4.0000 mg | Freq: Four times a day (QID) | INTRAMUSCULAR | Status: DC | PRN

## 2023-07-03 MED ORDER — TRAMADOL HCL 50 MG PO TABS
50.0000 mg | ORAL_TABLET | Freq: Two times a day (BID) | ORAL | Status: DC
  Administered 2023-07-03 – 2023-07-08 (×10): 50 mg via ORAL
  Filled 2023-07-03 (×10): qty 1

## 2023-07-03 MED ORDER — KCL-LACTATED RINGERS-D5W 20 MEQ/L IV SOLN
INTRAVENOUS | Status: AC
  Filled 2023-07-03 (×2): qty 1000

## 2023-07-03 MED ORDER — SODIUM CHLORIDE 0.9 % IV SOLN
1.0000 g | INTRAVENOUS | Status: DC
  Administered 2023-07-04 – 2023-07-05 (×2): 1 g via INTRAVENOUS
  Filled 2023-07-03 (×2): qty 10

## 2023-07-03 MED ORDER — ADULT MULTIVITAMIN W/MINERALS CH
1.0000 | ORAL_TABLET | Freq: Every day | ORAL | Status: DC
  Administered 2023-07-04 – 2023-07-13 (×10): 1 via ORAL
  Filled 2023-07-03 (×10): qty 1

## 2023-07-03 MED ORDER — MIRTAZAPINE 7.5 MG PO TABS
15.0000 mg | ORAL_TABLET | Freq: Every day | ORAL | Status: DC
  Administered 2023-07-03 – 2023-07-04 (×2): 15 mg via ORAL
  Filled 2023-07-03 (×3): qty 2

## 2023-07-03 MED ORDER — MEMANTINE HCL 5 MG PO TABS
10.0000 mg | ORAL_TABLET | Freq: Two times a day (BID) | ORAL | Status: DC
  Administered 2023-07-03 – 2023-07-05 (×4): 10 mg via ORAL
  Filled 2023-07-03 (×5): qty 2

## 2023-07-03 MED ORDER — POLYETHYLENE GLYCOL 3350 17 G PO PACK: 17.0000 g | PACK | Freq: Every day | ORAL | Status: DC | PRN

## 2023-07-03 NOTE — Assessment & Plan Note (Addendum)
 Continue aspirin , calcium  channel blocker

## 2023-07-03 NOTE — Assessment & Plan Note (Signed)
 With ongoing poor nutrition and poor p.o. intake. Reinforced with family to discuss with palliative around goals of care.

## 2023-07-03 NOTE — ED Triage Notes (Addendum)
 BIBA from home c/o recurring UTI- noticed dark urine- has suprapubic catheter in place- fever of 100.3 this AM, took Tylenol . 98.6 T now

## 2023-07-03 NOTE — Hospital Course (Signed)
 Patient is a 89 year old male who is on palliative care at home, who has advanced prostate cancer and a suprapubic catheter indwelling at all times, HTN, HLD, CAD, advanced dementia who was brought in to the ED with reports of fever at home.  Patient was recently admitted in March of this year with Enterococcus UTI and bacteremia.  He was noted to have a UTI at the doctor's office and had been on ampicillin  3 times daily since Monday.  Since that time has had poor appetite, poor p.o. intake and has just been weak.  He has refused to get out of the bed.  He is also complaining of low back pain and fever at home.  He is brought in today for additional workup.  He was noted to have a mildly elevated white count at 10.8, a very dirty urine, AKI 1.25 and hypokalemia.  CT showed left hydronephrosis as well as a possible renal cell carcinoma which had previously been seen in December.  We were asked to admit for possible UTI.

## 2023-07-03 NOTE — Assessment & Plan Note (Signed)
 Likely given white count and fever at home. Urine culture pending Rocephin  for now may need to change to ampicillin  if this is truly Enterococcus again.

## 2023-07-03 NOTE — ED Provider Notes (Addendum)
 Andre Casey EMERGENCY DEPARTMENT AT [facility] Provider Note   CSN: [id number] Arrival date & time: 07/03/23  1453     History  Chief Complaint  Patient presents with   Fever   UTI    Andre Safer. is a 89 y.o. male.  Patient brought in by EMS [facility] at the direction of the granddaughter.  Patient's had a fever up to 100.3 this morning.  Patient was admitted March 19 for sepsis from show they are concerned about that.  Patient is had a long indwelling suprapubic catheter.  Patient's urine is blood-tinged with that.  Patient was complaining to the nurse about some front flank pain.  But when I spoke with him he denied any significant pain.  Temperature 98.3 pulse 77 respirations 18 blood pressure 134/111 oxygen  sats are 99% on room air.  Patient is on all TRAM and appears on a regular basis.  Patient not on a blood thinner.  Patient's past medical history significant for the urinary retention chronic and thus why has suprapubic catheter.  Hypertension high cholesterol memory impairment history of prostate cancer coronary artery disease.  Past surgical history significant for prostatectomy.  Thyroid  surgery coronary angioplasty with stent in 2017.  Removal of penile prosthesis in 2023.  Patient is a former smoker cigarettes.       Home Medications Prior to Admission medications   Medication Sig Start Date End Date Taking? Authorizing Provider  acetaminophen  (TYLENOL ) 500 MG tablet Take 2 tablets (1,000 mg total) by mouth 3 (three) times daily. Patient taking differently: Take 500 mg by mouth 2 (two) times daily. 08/08/21   Aura Leeds Latif, DO  amLODipine  (NORVASC ) 10 MG tablet Take 10 mg by mouth at bedtime.    [provider]  aspirin  EC 81 MG tablet Take 1 tablet (81 mg total) by mouth daily. Swallow whole. Patient taking differently: Take 81 mg by mouth at bedtime. Swallow whole. 03/17/21   Lesa Rape, MD  feeding supplement (ENSURE ENLIVE /  ENSURE PLUS) LIQD Take 237 mLs by mouth 2 (two) times daily between meals. 01/28/23   Lesa Rape, MD  ipratropium-albuterol  (DUONEB) 0.5-2.5 (3) MG/3ML SOLN Take 3 mLs by nebulization 2 (two) times daily at 10 am and 4 pm.    [provider]  memantine  (NAMENDA ) 10 MG tablet Take 1 tablet (10 mg total) by mouth 2 (two) times daily. 04/08/21   Phebe Brasil, MD  mirtazapine  (REMERON ) 15 MG tablet Take 15 mg by mouth at bedtime.    [provider]  Multiple Vitamin (MULTIVITAMIN WITH MINERALS) TABS tablet Take 1 tablet by mouth daily.    [provider]  Probiotic Product (PROBIOTIC PO) Take 1 tablet by mouth daily with lunch.    [provider]  traMADol  (ULTRAM ) 50 MG tablet Take 1 tablet (50 mg total) by mouth every 12 (twelve) hours as needed for up to 4 doses. Patient taking differently: Take 50 mg by mouth 2 (two) times daily. 01/28/23   Lesa Rape, MD      Allergies    Sertraline, Simvastatin, Aricept  [donepezil ], Namzaric  [memantine  hcl-donepezil  hcl], and Wellbutrin  [bupropion ]    Review of Systems   Review of Systems  Unable to perform ROS: Dementia    Physical Exam Updated Vital Signs BP (!) 134/111 (BP Location: Left Arm)   Pulse 77   Temp 98.3 F (36.8 C) (Oral)   Resp 18   SpO2 99%  Physical Exam Vitals and nursing note reviewed.  Constitutional:      General: He is not in acute distress.    Appearance: Normal appearance. He is well-developed. He is not ill-appearing.  HENT:     Head: Normocephalic and atraumatic.     Mouth/Throat:     Mouth: Mucous membranes are moist.  Eyes:     Extraocular Movements: Extraocular movements intact.     Conjunctiva/sclera: Conjunctivae normal.     Pupils: Pupils are equal, round, and reactive to light.  Cardiovascular:     Rate and Rhythm: Normal rate and regular rhythm.     Heart sounds: No murmur heard. Pulmonary:     Effort: Pulmonary effort is normal. No respiratory distress.     Breath sounds:  Normal breath sounds.  Abdominal:     General: There is no distension.     Palpations: Abdomen is soft.     Tenderness: There is no abdominal tenderness. There is no guarding.     Comments: Suprapubic catheter in place.  Musculoskeletal:        General: No swelling.     Cervical back: Neck supple.  Skin:    General: Skin is warm and dry.     Capillary Refill: Capillary refill takes less than 2 seconds.  Neurological:     Mental Status: He is alert. Mental status is at baseline.  Psychiatric:        Mood and Affect: Mood normal.     ED Results / Procedures / Treatments   Labs (all labs ordered are listed, but only abnormal results are displayed) Labs Reviewed  COMPREHENSIVE METABOLIC PANEL WITH GFR - Abnormal; Notable for the following components:      Result Value   Sodium 134 (*)    Potassium 3.3 (*)    BUN 28 (*)    Creatinine, Ser 1.25 (*)    Albumin 3.2 (*)    GFR, Estimated 54 (*)    All other components within normal limits  CBC WITH DIFFERENTIAL/PLATELET - Abnormal; Notable for the following components:   WBC 10.8 (*)    Hemoglobin 11.7 (*)    HCT 37.2 (*)    Platelets 473 (*)    Monocytes Absolute 1.4 (*)    Abs Immature Granulocytes 0.08 (*)    All other components within normal limits  URINALYSIS, W/ REFLEX TO CULTURE (INFECTION SUSPECTED) - Abnormal; Notable for the following components:   Color, Urine AMBER (*)    Hgb urine dipstick SMALL (*)    Protein, ur 100 (*)    Nitrite POSITIVE (*)    Leukocytes,Ua SMALL (*)    Bacteria, UA RARE (*)    All other components within normal limits  URINE CULTURE  CULTURE, BLOOD (ROUTINE X 2)  CULTURE, BLOOD (ROUTINE X 2)  I-STAT CG4 LACTIC ACID, ED  I-STAT CG4 LACTIC ACID, ED    EKG None  Radiology No results found.  Procedures Procedures    Medications Ordered in ED Medications  cefTRIAXone  (ROCEPHIN ) 1 g in sodium chloride  0.9 % 100 mL IVPB (has no administration in time range)    ED Course/  Medical Decision Making/ A&P                                 Medical Decision Making Amount and/or Complexity of Data Reviewed Labs: ordered. Radiology: ordered.  Risk Decision regarding hospitalization.   Patient is vital signs reassuring.  Urinalysis obviously not normal positive nitrite 11-20 RBCs  21-50 white blood cells.  Bacteria was rare.  There was some budding yeast present.  Urine culture pending.  Complete metabolic panel significant for potassium of 3.3 GFR 54 creatinine 1.25 but not to ALT far off from baseline for him.  LFTs are normal.  Anion gap normal.  White count 10.8 hemoglobin 11.7 and platelets were 473.  Patient's lactic acid very reassuring at 1.2.  Currently patient not appearing septic.  But certainly could have urinary tract infection.  Will give a gram of Rocephin  have sent urine culture and will check blood cultures will get chest x-ray and will get CT renal since there was a complaint of some flank pain.  Although he denies it now.  ET scan with some findings that could represent ascending urinary tract infection.  And that was the family's concerns.  Also a suspicious right kidney lesion measuring 2.7 cm that could represent renal cell carcinoma but at his age that may be a mute point.  Chest x-ray without any acute findings.  Patient has been treated with Rocephin .  Patient's white count up a little bit as mentioned before 10.8.  Patient's renal function without significant change and lactic acid was very reassuring at 1.1.  Contact hospitalist for admission.  Final Clinical Impression(s) / ED Diagnoses Final diagnoses:  Fever, unspecified fever cause  Flank pain    Rx / DC Orders ED Discharge Orders     None         Nicklas Barns, MD 07/03/23 1721    Nicklas Barns, MD 07/03/23 2023

## 2023-07-03 NOTE — Assessment & Plan Note (Addendum)
 Mild anemia with hemoglobin 11.7 Trend

## 2023-07-03 NOTE — Assessment & Plan Note (Addendum)
 Continue calcium channel blocker.

## 2023-07-03 NOTE — Assessment & Plan Note (Signed)
 Continue Namenda  Remeron  as needed

## 2023-07-03 NOTE — Assessment & Plan Note (Signed)
 This is usually changed monthly by urology.  He has previously had Enterococcus as well as E. coli and other species.  Will Continue his Rocephin  Await urine cultures.

## 2023-07-03 NOTE — Assessment & Plan Note (Signed)
 Initially found on CT in December.  Does not appear to have progressed.  Reviewed this information with the daughter who was unaware of this renal mass.  She does not think the patient would be a candidate for surgical treatment

## 2023-07-03 NOTE — Assessment & Plan Note (Signed)
 Followed by urology.
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
## 2023-07-03 NOTE — Assessment & Plan Note (Signed)
Low cholesterol diet

## 2023-07-04 DIAGNOSIS — Z85828 Personal history of other malignant neoplasm of skin: Secondary | ICD-10-CM | POA: Diagnosis not present

## 2023-07-04 DIAGNOSIS — C61 Malignant neoplasm of prostate: Secondary | ICD-10-CM | POA: Diagnosis present

## 2023-07-04 DIAGNOSIS — N136 Pyonephrosis: Secondary | ICD-10-CM | POA: Diagnosis present

## 2023-07-04 DIAGNOSIS — Z681 Body mass index (BMI) 19 or less, adult: Secondary | ICD-10-CM | POA: Diagnosis not present

## 2023-07-04 DIAGNOSIS — T83518A Infection and inflammatory reaction due to other urinary catheter, initial encounter: Secondary | ICD-10-CM | POA: Diagnosis present

## 2023-07-04 DIAGNOSIS — B952 Enterococcus as the cause of diseases classified elsewhere: Secondary | ICD-10-CM | POA: Diagnosis present

## 2023-07-04 DIAGNOSIS — Z7189 Other specified counseling: Secondary | ICD-10-CM | POA: Diagnosis not present

## 2023-07-04 DIAGNOSIS — Z515 Encounter for palliative care: Secondary | ICD-10-CM | POA: Diagnosis not present

## 2023-07-04 DIAGNOSIS — N12 Tubulo-interstitial nephritis, not specified as acute or chronic: Secondary | ICD-10-CM | POA: Diagnosis present

## 2023-07-04 DIAGNOSIS — C7902 Secondary malignant neoplasm of left kidney and renal pelvis: Secondary | ICD-10-CM | POA: Diagnosis present

## 2023-07-04 DIAGNOSIS — Y738 Miscellaneous gastroenterology and urology devices associated with adverse incidents, not elsewhere classified: Secondary | ICD-10-CM | POA: Diagnosis present

## 2023-07-04 DIAGNOSIS — I2510 Atherosclerotic heart disease of native coronary artery without angina pectoris: Secondary | ICD-10-CM | POA: Diagnosis present

## 2023-07-04 DIAGNOSIS — F02B0 Dementia in other diseases classified elsewhere, moderate, without behavioral disturbance, psychotic disturbance, mood disturbance, and anxiety: Secondary | ICD-10-CM | POA: Diagnosis present

## 2023-07-04 DIAGNOSIS — G8929 Other chronic pain: Secondary | ICD-10-CM | POA: Diagnosis not present

## 2023-07-04 DIAGNOSIS — N2889 Other specified disorders of kidney and ureter: Secondary | ICD-10-CM | POA: Diagnosis not present

## 2023-07-04 DIAGNOSIS — E7800 Pure hypercholesterolemia, unspecified: Secondary | ICD-10-CM | POA: Diagnosis present

## 2023-07-04 DIAGNOSIS — M545 Low back pain, unspecified: Secondary | ICD-10-CM | POA: Diagnosis not present

## 2023-07-04 DIAGNOSIS — I252 Old myocardial infarction: Secondary | ICD-10-CM | POA: Diagnosis not present

## 2023-07-04 DIAGNOSIS — G301 Alzheimer's disease with late onset: Secondary | ICD-10-CM | POA: Diagnosis present

## 2023-07-04 DIAGNOSIS — E43 Unspecified severe protein-calorie malnutrition: Secondary | ICD-10-CM | POA: Diagnosis present

## 2023-07-04 DIAGNOSIS — N179 Acute kidney failure, unspecified: Secondary | ICD-10-CM | POA: Diagnosis present

## 2023-07-04 DIAGNOSIS — D630 Anemia in neoplastic disease: Secondary | ICD-10-CM | POA: Diagnosis present

## 2023-07-04 DIAGNOSIS — Z955 Presence of coronary angioplasty implant and graft: Secondary | ICD-10-CM | POA: Diagnosis not present

## 2023-07-04 DIAGNOSIS — I10 Essential (primary) hypertension: Secondary | ICD-10-CM | POA: Diagnosis present

## 2023-07-04 DIAGNOSIS — E785 Hyperlipidemia, unspecified: Secondary | ICD-10-CM | POA: Diagnosis not present

## 2023-07-04 DIAGNOSIS — Z7982 Long term (current) use of aspirin: Secondary | ICD-10-CM | POA: Diagnosis not present

## 2023-07-04 DIAGNOSIS — C7951 Secondary malignant neoplasm of bone: Secondary | ICD-10-CM | POA: Diagnosis present

## 2023-07-04 DIAGNOSIS — Z8249 Family history of ischemic heart disease and other diseases of the circulatory system: Secondary | ICD-10-CM | POA: Diagnosis not present

## 2023-07-04 DIAGNOSIS — G893 Neoplasm related pain (acute) (chronic): Secondary | ICD-10-CM | POA: Diagnosis present

## 2023-07-04 DIAGNOSIS — I700 Atherosclerosis of aorta: Secondary | ICD-10-CM | POA: Diagnosis present

## 2023-07-04 DIAGNOSIS — Z66 Do not resuscitate: Secondary | ICD-10-CM | POA: Diagnosis present

## 2023-07-04 LAB — BASIC METABOLIC PANEL WITH GFR
Anion gap: 9 (ref 5–15)
BUN: 28 mg/dL — ABNORMAL HIGH (ref 8–23)
CO2: 22 mmol/L (ref 22–32)
Calcium: 8.6 mg/dL — ABNORMAL LOW (ref 8.9–10.3)
Chloride: 102 mmol/L (ref 98–111)
Creatinine, Ser: 1.19 mg/dL (ref 0.61–1.24)
GFR, Estimated: 58 mL/min — ABNORMAL LOW (ref >60)
Glucose, Bld: 113 mg/dL — ABNORMAL HIGH (ref 70–99)
Potassium: 3.4 mmol/L — ABNORMAL LOW (ref 3.5–5.1)
Sodium: 133 mmol/L — ABNORMAL LOW (ref 135–145)

## 2023-07-04 LAB — CBC
HCT: 35.9 % — ABNORMAL LOW (ref 39.0–52.0)
Hemoglobin: 11.5 g/dL — ABNORMAL LOW (ref 13.0–17.0)
MCH: 28 pg (ref 26.0–34.0)
MCHC: 32 g/dL (ref 30.0–36.0)
MCV: 87.3 fL (ref 80.0–100.0)
Platelets: 385 10*3/uL (ref 150–400)
RBC: 4.11 MIL/uL — ABNORMAL LOW (ref 4.22–5.81)
RDW: 14.4 % (ref 11.5–15.5)
WBC: 9.7 10*3/uL (ref 4.0–10.5)
nRBC: 0 % (ref 0.0–0.2)

## 2023-07-04 MED ORDER — IPRATROPIUM-ALBUTEROL 0.5-2.5 (3) MG/3ML IN SOLN
3.0000 mL | Freq: Two times a day (BID) | RESPIRATORY_TRACT | Status: DC | PRN
  Administered 2023-07-11 – 2023-07-13 (×2): 3 mL via RESPIRATORY_TRACT
  Filled 2023-07-04 (×2): qty 3

## 2023-07-04 MED ORDER — HYDROCODONE-ACETAMINOPHEN 5-325 MG PO TABS
1.0000 | ORAL_TABLET | ORAL | Status: DC | PRN
  Administered 2023-07-04 – 2023-07-06 (×7): 1 via ORAL
  Filled 2023-07-04 (×7): qty 1

## 2023-07-04 MED ORDER — IPRATROPIUM-ALBUTEROL 0.5-2.5 (3) MG/3ML IN SOLN: 3.0000 mL | Freq: Two times a day (BID) | RESPIRATORY_TRACT | Status: DC

## 2023-07-04 MED ORDER — MORPHINE SULFATE (PF) 2 MG/ML IV SOLN
1.0000 mg | Freq: Once | INTRAVENOUS | Status: AC
  Administered 2023-07-04: 1 mg via INTRAVENOUS
  Filled 2023-07-04: qty 1

## 2023-07-04 NOTE — Hospital Course (Signed)
 Andre Casey. is a 89 y.o. male with a history of advanced prostate cancer, status post suprapubic catheter placement, hypertension, hyperlipidemia, CAD, dementia.  Patient presented secondary to fever and back pain was found to have evidence of likely pyelonephritis.  Patient started empirically on ceftriaxone  IV.  Blood and urine cultures obtained.

## 2023-07-04 NOTE — Progress Notes (Signed)
 PROGRESS NOTE    Andre Casey.  ZOX:[id number] DOB: [birth year] DOA: 07/03/2023 PCP: Glena Landau, MD   Brief Narrative: Andre Parra. is a 89 y.o. male with a history of advanced prostate cancer, status post suprapubic catheter placement, hypertension, hyperlipidemia, CAD, dementia.  Patient presented secondary to fever and back pain was found to have evidence of likely pyelonephritis.  Patient started empirically on ceftriaxone  IV.  Blood and urine cultures obtained.   Assessment and Plan:  Pyelonephritis CT imaging on admission concerning for possible ascending UTI on left. Complicated by suprapubic catheter. Patient started empirically on Ceftriaxone  IV. Urine and blood cultures obtained on admission and are pending. -Continue Ceftriaxone  IV -Follow-up urine and blood cultures  CAD Noted. -Continue aspirin   Prostate cancer Metastatic disease to multiple sites Patient is followed by urology. S/p suprapubic catheter placement.  Normocytic anemia Mild. Stable.  Alzheimer's dementia Noted. -Continue Namenda  and Remeron   Severe malnutrition Noted on admission. -Dietitian recommendations  Primary hypertension -Continue amlodipine   Renal mass, right Hyperattenuating lesion along the lateral interpolar right kidney measuring 2.7 cm concerning for renal cell carcinoma. Family unlikely to pursue workup based on discussion on admission.  Hyperlipidemia Aortic atherosclerosis Noted.     DVT prophylaxis: Lovenox  Code Status:   Code Status: Limited: Do not attempt resuscitation (DNR) -DNR-LIMITED -Do Not Intubate/DNI  Family Communication: Son on telephone Disposition Plan: Discharge pending ability to transition to oral antibiotics   Consultants:  None  Procedures:  None  Antimicrobials: Ceftriaxone  IV    Subjective: Patient reports ongoing back pain. No other concerns.  Objective: BP (!) 156/66 (BP Location: Right Arm)   Pulse 78    Temp 98.7 F (37.1 C) (Oral)   Resp 18   Ht 5\' 9"  (1.753 m)   Wt 55.7 kg   SpO2 94%   BMI 18.13 kg/m   Examination:  General exam: Appears calm and comfortable  Respiratory system: Clear to auscultation. Respiratory effort normal. Cardiovascular system: S1 & S2 heard, RRR. No murmurs. Gastrointestinal system: Abdomen is nondistended, soft and nontender. Normal bowel sounds heard. Central nervous system: Alert. Musculoskeletal: No edema. No calf tenderness   Data Reviewed: I have personally reviewed following labs and imaging studies  CBC Lab Results  Component Value Date   WBC 9.7 07/04/2023   RBC 4.11 (L) 07/04/2023   HGB 11.5 (L) 07/04/2023   HCT 35.9 (L) 07/04/2023   MCV 87.3 07/04/2023   MCH 28.0 07/04/2023   PLT 385 07/04/2023   MCHC 32.0 07/04/2023   RDW 14.4 07/04/2023   LYMPHSABS 2.2 07/03/2023   MONOABS 1.4 (H) 07/03/2023   EOSABS 0.2 07/03/2023   BASOSABS 0.1 07/03/2023     Last metabolic panel Lab Results  Component Value Date   NA 133 (L) 07/04/2023   K 3.4 (L) 07/04/2023   CL 102 07/04/2023   CO2 22 07/04/2023   BUN 28 (H) 07/04/2023   CREATININE 1.19 07/04/2023   GLUCOSE 113 (H) 07/04/2023   GFRNONAA 58 (L) 07/04/2023   GFRAA >60 03/22/2019   CALCIUM  8.6 (L) 07/04/2023   PHOS 2.1 (L) 05/15/2023   PROT 7.3 07/03/2023   ALBUMIN 3.2 (L) 07/03/2023   LABGLOB 2.3 04/14/2017   AGRATIO 2.0 04/14/2017   BILITOT 0.7 07/03/2023   ALKPHOS 85 07/03/2023   AST 22 07/03/2023   ALT 17 07/03/2023   ANIONGAP 9 07/04/2023    GFR: Estimated Creatinine Clearance: 31.9 mL/min (by C-G formula based on SCr of 1.19 mg/dL).  Recent  Results (from the past 240 hours)  Culture, blood (Routine X 2) w Reflex to ID Panel     Status: None (Preliminary result)   Collection Time: 07/03/23  5:55 PM   Specimen: BLOOD  Result Value Ref Range Status   Specimen Description   Final    BLOOD Performed at [facility], [address]  [place], [place] [address]    Special Requests   Final    BOTTLES DRAWN AEROBIC AND ANAEROBIC Blood Culture adequate volume Performed at [facility], [address], [place], [place] [address]    Culture   Final    NO GROWTH < 24 HOURS Performed at [facility], [address], [place], [place] [address]    Report Status PENDING  Incomplete  Culture, blood (Routine X 2) w Reflex to ID Panel     Status: None (Preliminary result)   Collection Time: 07/03/23  5:59 PM   Specimen: Right Antecubital; Blood  Result Value Ref Range Status   Specimen Description   Final    RIGHT ANTECUBITAL Performed at [facility], [address], [place], [place] [address]    Special Requests   Final    BOTTLES DRAWN AEROBIC AND ANAEROBIC Blood Culture results may not be optimal due to an inadequate volume of blood received in culture bottles Performed at [facility], [address], [place], [place] [address]    Culture   Final    NO GROWTH < 24 HOURS Performed at [facility], [address], [place], [place] [address]    Report Status PENDING  Incomplete      Radiology Studies: CT Renal Stone Study Result Date: 07/03/2023 CLINICAL DATA:  Recurrent urinary tract infection EXAM: CT ABDOMEN AND PELVIS WITHOUT CONTRAST TECHNIQUE: Multidetector CT imaging of the abdomen and pelvis was performed following the standard protocol without IV contrast. RADIATION DOSE REDUCTION: This exam was performed according to the departmental dose-optimization program which includes automated exposure control, adjustment of the mA and/or kV according to patient size and/or use of iterative reconstruction technique. COMPARISON:  CT abdomen and pelvis dated 01/25/2023 FINDINGS: Lower chest: Severe emphysematous changes of the partially imaged lung. No pleural effusion or pneumothorax demonstrated. Partially imaged heart size is normal. Hepatobiliary: No focal  hepatic lesions. No intra or extrahepatic biliary ductal dilation. Normal gallbladder. 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 No main pancreatic ductal dilation by noncontrast technique. Spleen: Normal in size without focal abnormality. Adrenals/Urinary Tract: No adrenal nodules. Mildly hyperattenuating lesion along the lateral interpolar right kidney measures 2.7 cm (2:26), previously suspicious for a renal cell carcinoma. Slightly increased left hydroureteronephrosis. The distal left ureter is obscured by artifact from bilateral pelvic surgical clips. Urinary bladder is decompressed with suprapubic catheter in-situ. Stomach/Bowel: Small hiatal hernia. Normal appearance of the stomach. Moderate duodenal diverticulum arising from the distal duodenum. No evidence of bowel wall thickening, distention, or inflammatory changes. Colonic diverticulosis without acute diverticulitis. Appendix is not discretely seen. Vascular/Lymphatic: Aortic atherosclerosis. No enlarged abdominal or pelvic lymph nodes. Reproductive: Prostatectomy. Other: No free fluid, fluid collection, or free air. Musculoskeletal: No acute or abnormal lytic or blastic osseous lesions. Multilevel degenerative changes of the partially imaged thoracic and lumbar spine. IMPRESSION: 1. Slightly increased left hydroureteronephrosis, which may be secondary to ascending urinary tract infection. The distal left  ureter is obscured by artifact from the pelvic surgical clips. 2. Urinary bladder is decompressed with suprapubic catheter in-situ. 3. Mildly hyperattenuating lesion along the lateral interpolar right kidney measures 2.7 cm, previously suspicious for a renal cell carcinoma. 4.  Aortic Atherosclerosis (ICD10-I70.0). Electronically Signed   By: Limin  Xu M.D.   On: 07/03/2023 20:13    DG Chest Port 1 View Result Date: 07/03/2023 CLINICAL DATA:  Fever EXAM: PORTABLE CHEST 1 VIEW COMPARISON:  X-ray 05/13/2023 and older FINDINGS: There is some linear opacity left lung base likely scar or atelectasis. No consolidation, pneumothorax or effusion. Normal cardiopericardial silhouette. Calcified aorta. Osteopenia. Degenerative changes of the spine and shoulders. Right shoulder reverse arthroplasty seen at the edge of the imaging field. There is an elevated left humeral head. Please correlate for rotator cuff tear. IMPRESSION: Left basilar scar or atelectasis.  No consolidation or effusion. Electronically Signed   By: Adrianna Horde M.D.   On: 07/03/2023 18:28      LOS: 0 days    Aneita Keens, MD [facility] 07/04/2023, 12:44 PM   If 7PM-7AM, please contact night-coverage [email or web address]

## 2023-07-04 NOTE — Plan of Care (Signed)

## 2023-07-05 ENCOUNTER — Inpatient Hospital Stay (HOSPITAL_COMMUNITY)

## 2023-07-05 ENCOUNTER — Encounter (HOSPITAL_COMMUNITY): Payer: Self-pay | Admitting: Family Medicine

## 2023-07-05 DIAGNOSIS — M545 Low back pain, unspecified: Secondary | ICD-10-CM

## 2023-07-05 DIAGNOSIS — N12 Tubulo-interstitial nephritis, not specified as acute or chronic: Secondary | ICD-10-CM | POA: Diagnosis not present

## 2023-07-05 LAB — BASIC METABOLIC PANEL WITH GFR
Anion gap: 12 (ref 5–15)
BUN: 23 mg/dL (ref 8–23)
CO2: 22 mmol/L (ref 22–32)
Calcium: 8.8 mg/dL — ABNORMAL LOW (ref 8.9–10.3)
Chloride: 104 mmol/L (ref 98–111)
Creatinine, Ser: 1.1 mg/dL (ref 0.61–1.24)
GFR, Estimated: >60 mL/min (ref >60)
Glucose, Bld: 112 mg/dL — ABNORMAL HIGH (ref 70–99)
Potassium: 4 mmol/L (ref 3.5–5.1)
Sodium: 138 mmol/L (ref 135–145)

## 2023-07-05 MED ORDER — MIRTAZAPINE 15 MG PO TABS
15.0000 mg | ORAL_TABLET | Freq: Every day | ORAL | Status: DC
  Administered 2023-07-05 – 2023-07-12 (×8): 15 mg via ORAL
  Filled 2023-07-05 (×8): qty 1

## 2023-07-05 MED ORDER — ENOXAPARIN SODIUM 40 MG/0.4ML IJ SOSY
40.0000 mg | PREFILLED_SYRINGE | INTRAMUSCULAR | Status: DC
  Administered 2023-07-06 – 2023-07-09 (×4): 40 mg via SUBCUTANEOUS
  Filled 2023-07-05 (×4): qty 0.4

## 2023-07-05 MED ORDER — MEMANTINE HCL 10 MG PO TABS
10.0000 mg | ORAL_TABLET | Freq: Two times a day (BID) | ORAL | Status: DC
  Administered 2023-07-05 – 2023-07-13 (×16): 10 mg via ORAL
  Filled 2023-07-05 (×17): qty 1

## 2023-07-05 NOTE — Plan of Care (Signed)

## 2023-07-05 NOTE — Progress Notes (Signed)
 PROGRESS NOTE    Andre Casey.  ZOX:[id number] DOB: 1929/03/04 DOA: 07/03/2023 PCP: Glena Landau, MD   Brief Narrative: Andre Casey. is a 89 y.o. male with a history of advanced prostate cancer, status post suprapubic catheter placement, hypertension, hyperlipidemia, CAD, dementia.  Patient presented secondary to fever and back pain was found to have evidence of likely pyelonephritis.  Patient started empirically on ceftriaxone  IV.  Blood and urine cultures obtained.   Assessment and Plan:  Pyelonephritis CT imaging on admission concerning for possible ascending UTI on left. Complicated by suprapubic catheter. Patient started empirically on Ceftriaxone  IV. Urine and blood cultures obtained on admission and are pending. Blood cultures are no growth to date and urine culture is currently being re-incubated for better growth. -Continue Ceftriaxone  IV -Follow-up urine and blood cultures  CAD Noted. -Continue aspirin   Prostate cancer Metastatic disease to multiple sites Patient is followed by urology. S/p suprapubic catheter placement.  Back pain Unclear etiology. Patient does have evidence of a right kidney mass, however symptoms does not seem correlate. Pain is mostly lumbar in region. No recent falls, but has had falls in the past. -Thoracic and lumbar spine x-rays  Normocytic anemia Mild. Stable.  Alzheimer's dementia Noted. -Continue Namenda  and Remeron   Severe malnutrition Noted on admission. -Dietitian recommendations  Primary hypertension -Continue amlodipine   Renal mass, right Hyperattenuating lesion along the lateral interpolar right kidney measuring 2.7 cm concerning for renal cell carcinoma. Family unlikely to pursue workup based on discussion on admission.  Hyperlipidemia Aortic atherosclerosis Noted.     DVT prophylaxis: Lovenox  Code Status:   Code Status: Limited: Do not attempt resuscitation (DNR) -DNR-LIMITED -Do Not  Intubate/DNI  Family Communication: None at bedside Disposition Plan: Discharge pending ability to transition to oral antibiotics   Consultants:  None  Procedures:  None  Antimicrobials: Ceftriaxone  IV    Subjective: Patient reports ongoing back pain. No other concerns.  Objective: BP (!) 145/73 (BP Location: Left Arm)   Pulse 88   Temp 98.5 F (36.9 C) (Oral)   Resp 20   Ht 5\' 9"  (1.753 m)   Wt 55.7 kg   SpO2 100%   BMI 18.13 kg/m   Examination:  General exam: Appears calm and comfortable Respiratory system: Clear to auscultation. Respiratory effort normal. Cardiovascular system: S1 & S2 heard, RRR. No murmurs, rubs, gallops or clicks. Gastrointestinal system: Abdomen is nondistended, soft and nontender. Normal bowel sounds heard. Central nervous system: Alert. No focal neurological deficits. Musculoskeletal: No edema. No calf tenderness. Tenderness over thoracic/lumbar spine Skin: No cyanosis. No rashes   Data Reviewed: I have personally reviewed following labs and imaging studies  CBC Lab Results  Component Value Date   WBC 9.7 07/04/2023   RBC 4.11 (L) 07/04/2023   HGB 11.5 (L) 07/04/2023   HCT 35.9 (L) 07/04/2023   MCV 87.3 07/04/2023   MCH 28.0 07/04/2023   PLT 385 07/04/2023   MCHC 32.0 07/04/2023   RDW 14.4 07/04/2023   LYMPHSABS 2.2 07/03/2023   MONOABS 1.4 (H) 07/03/2023   EOSABS 0.2 07/03/2023   BASOSABS 0.1 07/03/2023     Last metabolic panel Lab Results  Component Value Date   NA 138 07/05/2023   K 4.0 07/05/2023   CL 104 07/05/2023   CO2 22 07/05/2023   BUN 23 07/05/2023   CREATININE 1.10 07/05/2023   GLUCOSE 112 (H) 07/05/2023   GFRNONAA >60 07/05/2023   GFRAA >60 03/22/2019   CALCIUM  8.8 (L) 07/05/2023  PHOS 2.1 (L) 05/15/2023   PROT 7.3 07/03/2023   ALBUMIN 3.2 (L) 07/03/2023   LABGLOB 2.3 04/14/2017   AGRATIO 2.0 04/14/2017   BILITOT 0.7 07/03/2023   ALKPHOS 85 07/03/2023   AST 22 07/03/2023   ALT 17 07/03/2023    ANIONGAP 12 07/05/2023    GFR: Estimated Creatinine Clearance: 34.5 mL/min (by C-G formula based on SCr of 1.1 mg/dL).  Recent Results (from the past 240 hours)  Urine Culture     Status: None (Preliminary result)   Collection Time: 07/03/23  4:29 PM   Specimen: Urine, Random  Result Value Ref Range Status   Specimen Description   Final    URINE, RANDOM Performed at [facility], [address] [place], [place] [address]    Special Requests   Final    NONE Reflexed from [id number] Performed at [facility], [address] [place], [place] [address]    Culture   Final    CULTURE REINCUBATED FOR BETTER GROWTH Performed at [facility], [address] [place], [place] [address]    Report Status PENDING  Incomplete  Culture, blood (Routine X 2) w Reflex to ID Panel     Status: None (Preliminary result)   Collection Time: 07/03/23  5:55 PM   Specimen: BLOOD  Result Value Ref Range Status   Specimen Description   Final    BLOOD Performed at [facility], [address] [place], [place] [address]    Special Requests   Final    BOTTLES DRAWN AEROBIC AND ANAEROBIC Blood Culture adequate volume Performed at [facility], [address] [place], [place] [address]    Culture   Final    NO GROWTH 2 DAYS Performed at [facility], [address], [place], [place] [address]    Report Status PENDING  Incomplete  Culture, blood (Routine X 2) w Reflex to ID Panel     Status: None (Preliminary result)   Collection Time: 07/03/23  5:59 PM   Specimen: Right Antecubital; Blood  Result Value Ref Range Status   Specimen Description   Final    RIGHT ANTECUBITAL Performed at [facility], [address], [place], [place] [address]    Special Requests   Final    BOTTLES DRAWN AEROBIC AND ANAEROBIC Blood Culture results may not be optimal due to an inadequate volume of blood received in  culture bottles Performed at [facility], [address], [place], [place] [address]    Culture   Final    NO GROWTH 2 DAYS Performed at [facility], [address] [place], [place] [address]    Report Status PENDING  Incomplete      Radiology Studies: CT Renal Stone Study Result Date: 07/03/2023 CLINICAL DATA:  Recurrent urinary tract infection EXAM: CT ABDOMEN AND PELVIS WITHOUT CONTRAST TECHNIQUE: Multidetector CT imaging of the abdomen and pelvis was performed following the standard protocol without IV contrast. RADIATION DOSE REDUCTION: This exam was performed according to the departmental dose-optimization program which includes automated exposure control, adjustment of the mA and/or kV according to patient size and/or use of iterative reconstruction technique. COMPARISON:  CT abdomen and pelvis dated 01/25/2023 FINDINGS: Lower chest: Severe emphysematous changes of the partially imaged lung. No pleural effusion or pneumothorax demonstrated. Partially imaged heart size is normal. Hepatobiliary: No focal hepatic lesions. No intra or extrahepatic biliary ductal dilation. Normal gallbladder. 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 No main pancreatic ductal dilation by noncontrast technique. Spleen: Normal in size without focal abnormality. Adrenals/Urinary Tract: No adrenal nodules. Mildly hyperattenuating lesion along the lateral interpolar right kidney measures 2.7 cm (2:26), previously suspicious for a renal cell carcinoma. Slightly increased left hydroureteronephrosis. The distal left ureter is obscured by artifact from bilateral pelvic surgical clips. Urinary bladder is decompressed with suprapubic catheter in-situ. Stomach/Bowel: Small hiatal hernia. Normal appearance of the stomach. Moderate  duodenal diverticulum arising from the distal duodenum. No evidence of bowel wall thickening, distention, or inflammatory changes. Colonic diverticulosis without acute diverticulitis. Appendix is not discretely seen. Vascular/Lymphatic: Aortic atherosclerosis. No enlarged abdominal or pelvic lymph nodes. Reproductive: Prostatectomy. Other: No free fluid, fluid collection, or free air. Musculoskeletal: No acute or abnormal lytic or blastic osseous lesions. Multilevel degenerative changes of the partially imaged thoracic and lumbar spine. IMPRESSION: 1. Slightly increased left hydroureteronephrosis, which may be secondary to ascending urinary tract infection. The distal left ureter is obscured by artifact from the pelvic surgical clips. 2. Urinary bladder is decompressed with suprapubic catheter in-situ. 3. Mildly hyperattenuating lesion along the lateral interpolar right kidney measures 2.7 cm, previously suspicious for a renal cell carcinoma. 4.  Aortic Atherosclerosis (ICD10-I70.0). Electronically Signed   By: Limin  Xu M.D.   On: 07/03/2023 20:13   DG Chest Port 1 View Result Date: 07/03/2023 CLINICAL DATA:  Fever EXAM: PORTABLE CHEST 1 VIEW COMPARISON:  X-ray 05/13/2023 and older FINDINGS: There is some linear opacity left lung base likely scar or atelectasis. No consolidation, pneumothorax or effusion. Normal cardiopericardial silhouette. Calcified aorta. Osteopenia. Degenerative changes of the spine and shoulders. Right shoulder reverse arthroplasty seen at the edge of the imaging field. There is an elevated left humeral head. Please correlate for rotator cuff tear. IMPRESSION: Left basilar scar or atelectasis.  No consolidation or effusion. Electronically Signed   By: Adrianna Horde M.D.   On: 07/03/2023 18:28      LOS: 1 day    Aneita Keens, MD [facility] 07/05/2023, 10:41 AM   If 7PM-7AM, please contact night-coverage [email or web address]

## 2023-07-06 ENCOUNTER — Inpatient Hospital Stay (HOSPITAL_COMMUNITY)

## 2023-07-06 DIAGNOSIS — N12 Tubulo-interstitial nephritis, not specified as acute or chronic: Secondary | ICD-10-CM | POA: Diagnosis not present

## 2023-07-06 DIAGNOSIS — M545 Low back pain, unspecified: Secondary | ICD-10-CM | POA: Diagnosis not present

## 2023-07-06 LAB — URINE CULTURE

## 2023-07-06 MED ORDER — AMOXICILLIN 500 MG PO CAPS
500.0000 mg | ORAL_CAPSULE | Freq: Three times a day (TID) | ORAL | Status: DC
  Administered 2023-07-06 – 2023-07-13 (×22): 500 mg via ORAL
  Filled 2023-07-06 (×23): qty 1

## 2023-07-06 MED ORDER — GADOBUTROL 1 MMOL/ML IV SOLN
5.0000 mL | Freq: Once | INTRAVENOUS | Status: AC | PRN
  Administered 2023-07-06: 5 mL via INTRAVENOUS

## 2023-07-06 MED ORDER — SULFAMETHOXAZOLE-TRIMETHOPRIM 400-80 MG PO TABS
1.0000 | ORAL_TABLET | Freq: Two times a day (BID) | ORAL | Status: DC
Start: 2023-07-06 — End: 2023-07-10
  Administered 2023-07-06 – 2023-07-09 (×8): 1 via ORAL
  Filled 2023-07-06 (×9): qty 1

## 2023-07-06 MED ORDER — HYDROCODONE-ACETAMINOPHEN 5-325 MG PO TABS
1.0000 | ORAL_TABLET | ORAL | Status: DC | PRN
Start: 2023-07-06 — End: 2023-07-08
  Administered 2023-07-06 – 2023-07-08 (×5): 2 via ORAL
  Filled 2023-07-06 (×5): qty 2

## 2023-07-06 NOTE — Plan of Care (Signed)

## 2023-07-06 NOTE — Progress Notes (Signed)
 Received a page regarding critical MRI results. Patient with widespread evidence of metastatic disease with concern for likely primary renal cell carcinoma. Patient with evidence of a 16 mm enhancing lesion at L1 which is most likely the etiology for his severe back pain and consistent with metastatic disease. Called patient's son, HCPOA, and discussed new findings. We dicussed goals of care. Focus at this point is more on comfort than treatment, although he would like to treat reversible disease when appropriate. We discussed rehab and agreed rehab would likely not help [name], especially with how much pain he is in. We discussed palliative care and hospice and agreed home with hospice was a likely option for discharge planning. TOC consultation placed for hospice. Palliative care consult also placed to assist with pain management. Patient is asleep at this time, so will update him in the morning. Nursing updated via telephone.   Aneita Keens, MD [facility] 07/06/2023, 5:23 PM

## 2023-07-06 NOTE — Progress Notes (Signed)
 OT Cancellation Note  Patient Details Name: Andre Casey. MRN: [id number] DOB: 1929-08-05   Cancelled Treatment:    Reason Eval/Treat Not Completed: Pain limiting ability to participate Patient reporting increased back pain and declining to participate in therapy. Patient repositioning in side lying with pillows to support proper body mechanics.nurse sent secure chat about pain levels.  OT to continue to follow and check back on 5/13.   Wynette Heckler, MS Acute Rehabilitation Department Office# [phone]  07/06/2023, 3:38 PM

## 2023-07-06 NOTE — Progress Notes (Signed)
 PROGRESS NOTE    Andre Casey.  VHQ:[id number] DOB: [birth year] DOA: 07/03/2023 PCP: Andre Landau, MD   Brief Narrative: Andre Casey. is a 89 y.o. male with a history of advanced prostate cancer, status post suprapubic catheter placement, hypertension, hyperlipidemia, CAD, dementia.  Patient presented secondary to fever and back pain was found to have evidence of likely pyelonephritis.  Patient started empirically on ceftriaxone  IV.  Blood and urine cultures obtained.   Assessment and Plan:  Pyelonephritis CT imaging on admission concerning for possible ascending UTI on left. Complicated by suprapubic catheter. Patient started empirically on Ceftriaxone  IV. Urine and blood cultures obtained on admission and are pending. Blood cultures are no growth to date and urine culture is significant for enterococcus faecalis and stenotrophomonas maltophilia. -Discontinue Ceftriaxone  IV and start Amoxicillin  and Bactrim  CAD Noted. -Continue aspirin   Prostate cancer Metastatic disease to multiple sites Patient is followed by urology. S/p suprapubic catheter placement.  Back pain Unclear etiology. Patient does have evidence of a right kidney mass, however symptoms does not seem correlate. Pain is mostly lumbar in region. No recent falls, but has had falls in the past. X-rays suggest possible compression fractures. -MRI thoracic/lumbar spine -PT/OT evals  Normocytic anemia Mild. Stable.  Alzheimer's dementia Noted. -Continue Namenda  and Remeron   Severe malnutrition Noted on admission. -Dietitian recommendations  Primary hypertension -Continue amlodipine   Renal mass, right Hyperattenuating lesion along the lateral interpolar right kidney measuring 2.7 cm concerning for renal cell carcinoma. Family unlikely to pursue workup based on discussion on admission.  Hyperlipidemia Aortic atherosclerosis Noted.     DVT prophylaxis: Lovenox  Code Status:   Code  Status: Limited: Do not attempt resuscitation (DNR) -DNR-LIMITED -Do Not Intubate/DNI  Family Communication: None at bedside. Daughter-in-law on telephone Disposition Plan: Discharge pending MRI results and PT/OT recommendations   Consultants:  None  Procedures:  None  Antimicrobials: Ceftriaxone  IV    Subjective: Continued back pain. No other concerns. Pain is affecting his ability to sit up to eat.  Objective: BP (!) 147/74 (BP Location: Left Arm)   Pulse 79   Temp 98.5 F (36.9 C) (Oral)   Resp 20   Ht 5\' 9"  (1.753 m)   Wt 55.7 kg   SpO2 95%   BMI 18.13 kg/m   Examination:  General exam: Appears calm and comfortable Respiratory system: Clear to auscultation. Respiratory effort normal. Cardiovascular system: S1 & S2 heard, RRR. No murmurs. Gastrointestinal system: Abdomen is nondistended, soft and nontender. Normal bowel sounds heard. Central nervous system: Alert.   Data Reviewed: I have personally reviewed following labs and imaging studies  CBC Lab Results  Component Value Date   WBC 9.7 07/04/2023   RBC 4.11 (L) 07/04/2023   HGB 11.5 (L) 07/04/2023   HCT 35.9 (L) 07/04/2023   MCV 87.3 07/04/2023   MCH 28.0 07/04/2023   PLT 385 07/04/2023   MCHC 32.0 07/04/2023   RDW 14.4 07/04/2023   LYMPHSABS 2.2 07/03/2023   MONOABS 1.4 (H) 07/03/2023   EOSABS 0.2 07/03/2023   BASOSABS 0.1 07/03/2023     Last metabolic panel Lab Results  Component Value Date   NA 138 07/05/2023   K 4.0 07/05/2023   CL 104 07/05/2023   CO2 22 07/05/2023   BUN 23 07/05/2023   CREATININE 1.10 07/05/2023   GLUCOSE 112 (H) 07/05/2023   GFRNONAA >60 07/05/2023   GFRAA >60 03/22/2019   CALCIUM  8.8 (L) 07/05/2023   PHOS 2.1 (L) 05/15/2023   PROT  7.3 07/03/2023   ALBUMIN 3.2 (L) 07/03/2023   LABGLOB 2.3 04/14/2017   AGRATIO 2.0 04/14/2017   BILITOT 0.7 07/03/2023   ALKPHOS 85 07/03/2023   AST 22 07/03/2023   ALT 17 07/03/2023   ANIONGAP 12 07/05/2023     GFR: Estimated Creatinine Clearance: 34.5 mL/min (by C-G formula based on SCr of 1.1 mg/dL).  Recent Results (from the past 240 hours)  Urine Culture     Status: Abnormal   Collection Time: 07/03/23  4:29 PM   Specimen: Urine, Random  Result Value Ref Range Status   Specimen Description   Final    URINE, RANDOM Performed at [facility], [address] [place], [place] [address]    Special Requests   Final    NONE Reflexed from [id number] Performed at [facility], [address] [place], [place] [address]    Culture (A)  Final    50,000 COLONIES/mL ENTEROCOCCUS FAECALIS >=100,000 COLONIES/mL STENOTROPHOMONAS MALTOPHILIA    Report Status 07/06/2023 FINAL  Final   Organism ID, Bacteria ENTEROCOCCUS FAECALIS (A)  Final   Organism ID, Bacteria STENOTROPHOMONAS MALTOPHILIA (A)  Final      Susceptibility   Enterococcus faecalis - MIC*    AMPICILLIN  <=2 SENSITIVE Sensitive     NITROFURANTOIN <=16 SENSITIVE Sensitive     VANCOMYCIN  1 SENSITIVE Sensitive     * 50,000 COLONIES/mL ENTEROCOCCUS FAECALIS   Stenotrophomonas maltophilia - MIC*    LEVOFLOXACIN 1 SENSITIVE Sensitive     TRIMETH/SULFA <=20 SENSITIVE Sensitive     * >=100,000 COLONIES/mL STENOTROPHOMONAS MALTOPHILIA  Culture, blood (Routine X 2) w Reflex to ID Panel     Status: None (Preliminary result)   Collection Time: 07/03/23  5:55 PM   Specimen: BLOOD  Result Value Ref Range Status   Specimen Description   Final    BLOOD Performed at [facility], [address], [place], [place] [address]    Special Requests   Final    BOTTLES DRAWN AEROBIC AND ANAEROBIC Blood Culture adequate volume Performed at [facility], [address] [place], [place] [address]    Culture   Final    NO GROWTH 3 DAYS Performed at [facility], [address], [place], [place] [address]    Report Status PENDING  Incomplete  Culture, blood (Routine X 2) w  Reflex to ID Panel     Status: None (Preliminary result)   Collection Time: 07/03/23  5:59 PM   Specimen: Right Antecubital; Blood  Result Value Ref Range Status   Specimen Description   Final    RIGHT ANTECUBITAL Performed at [facility], [address] [place], [place] [address]    Special Requests   Final    BOTTLES DRAWN AEROBIC AND ANAEROBIC Blood Culture results may not be optimal due to an inadequate volume of blood received in culture bottles Performed at [facility], [address] [place], [place] [address]    Culture   Final    NO GROWTH 3 DAYS Performed at [facility], [address], [place], [place] [address]    Report Status PENDING  Incomplete      Radiology Studies: DG Thoracic Spine 2 View Result Date: 07/05/2023 CLINICAL DATA:  91320 Back pain [id number] EXAM: THORACIC SPINE 2 VIEWS; LUMBAR SPINE - 2-3 VIEW COMPARISON:  CT renal 07/03/2023, CT chest abdomen pelvis 01/25/2023 FINDINGS: Limited evaluation due to overlapping osseous structures and overlying  soft tissues. Diffusely decreased bone density. Age-indeterminate interval worsening of vertebral body height loss of the thoracolumbar spine. Multilevel severe degenerative changes of the spine. There is no evidence of thoracolumbar acute displaced spine fracture. Stable grade 1 anterolisthesis of L5 on S1. No other significant bone abnormalities are identified. Surgical clips consistent with lymph node dissection of the pelvis. Atherosclerotic plaque. IMPRESSION: Age-indeterminate interval worsening of vertebral body height loss of the thoracolumbar spine. Correlate with point tenderness to palpation for an acute component. Electronically Signed   By: Morgane  Naveau M.D.   On: 07/05/2023 20:18   DG Lumbar Spine 2-3 Views Result Date: 07/05/2023 CLINICAL DATA:  91320 Back pain [id number] EXAM: THORACIC SPINE 2 VIEWS; LUMBAR SPINE - 2-3 VIEW COMPARISON:  CT renal 07/03/2023, CT chest  abdomen pelvis 01/25/2023 FINDINGS: Limited evaluation due to overlapping osseous structures and overlying soft tissues. Diffusely decreased bone density. Age-indeterminate interval worsening of vertebral body height loss of the thoracolumbar spine. Multilevel severe degenerative changes of the spine. There is no evidence of thoracolumbar acute displaced spine fracture. Stable grade 1 anterolisthesis of L5 on S1. No other significant bone abnormalities are identified. Surgical clips consistent with lymph node dissection of the pelvis. Atherosclerotic plaque. IMPRESSION: Age-indeterminate interval worsening of vertebral body height loss of the thoracolumbar spine. Correlate with point tenderness to palpation for an acute component. Electronically Signed   By: Morgane  Naveau M.D.   On: 07/05/2023 20:18      LOS: 2 days    Aneita Keens, MD [facility] 07/06/2023, 10:40 AM   If 7PM-7AM, please contact night-coverage [email or web address]

## 2023-07-06 NOTE — Progress Notes (Signed)
 Initial Nutrition Assessment  DOCUMENTATION CODES:   Underweight  INTERVENTION:  - Liberalize diet to Regular to provide the widest variety of food options and avoid restricting intake given advanced age and suspected malnutrition. - Automatic house trays given advanced dementia.  - Ensure Plus High Protein po BID, each supplement provides 350 kcal and 20 grams of protein. - Magic cup BID with meals, each supplement provides 290 kcal and 9 grams of protein  - Encourage intake at all meals and of supplements.   - Continue Multivitamin with minerals daily.  - Monitor weight trends.   NUTRITION DIAGNOSIS:   Increased nutrient needs related to chronic illness as evidenced by estimated needs.  GOAL:   Patient will meet greater than or equal to 90% of their needs  MONITOR:   PO intake, Supplement acceptance, Weight trends  REASON FOR ASSESSMENT:   Consult Assessment of nutrition requirement/status  ASSESSMENT:    89 y.o. male with PMH significant of advanced prostate cancer on palliative care at home, HTN, HLD, CAD, advanced dementia who was brought in to the ED with reports of fever at home. Admitted for pyelonephritis.   Patient out of room at time of visit, no visitors or family at bedside.   Per chart review, weight has been stable since December. Suspect malnutrition given low BMI but unable to confirm at this time due to inability to obtain NFPE.  He is documented to be consuming 0-30% of meals. On a heart healthy diet. Will liberalize to Regular given low weight and advanced age. Patient documented to be consuming Ensure twice daily. Will also add Magic Cup to further support intake.   Medications reviewed and include: Remeron , MVI  Labs reviewed:  -   NUTRITION - FOCUSED PHYSICAL EXAM:  Unable to obtain, patient out of room  Diet Order:   Diet Order             Diet Heart Room service appropriate? Yes; Fluid consistency: Thin  Diet effective now                    EDUCATION NEEDS:  Not appropriate for education at this time  Skin:  Skin Assessment: Reviewed RN Assessment  Last BM:  5/10 - type 5  Height:  Ht Readings from Last 1 Encounters:  07/04/23 5\' 9"  (1.753 m)   Weight:  Wt Readings from Last 1 Encounters:  07/04/23 55.7 kg    BMI:  Body mass index is 18.13 kg/m.  Estimated Nutritional Needs:  Kcal:  1650-1850 kcals Protein:  80-90 grams Fluid:  >/= 1.7L    Scheryl Cushing RD, LDN Contact via Secure Chat.

## 2023-07-07 DIAGNOSIS — M545 Low back pain, unspecified: Secondary | ICD-10-CM | POA: Diagnosis not present

## 2023-07-07 DIAGNOSIS — N12 Tubulo-interstitial nephritis, not specified as acute or chronic: Secondary | ICD-10-CM | POA: Diagnosis not present

## 2023-07-07 MED ORDER — GUAIFENESIN-DM 100-10 MG/5ML PO SYRP
5.0000 mL | ORAL_SOLUTION | ORAL | Status: DC | PRN
Start: 2023-07-07 — End: 2023-07-13

## 2023-07-07 NOTE — Progress Notes (Signed)
 PROGRESS NOTE    Andre Casey.  ZOX:[id number] DOB: [birth year] DOA: 07/03/2023 PCP: Glena Landau, MD   Brief Narrative: Andre Alsbrooks. is a 89 y.o. male with a history of advanced prostate cancer, status post suprapubic catheter placement, hypertension, hyperlipidemia, CAD, dementia.  Patient presented secondary to fever and back pain was found to have evidence of likely pyelonephritis.  Patient started empirically on ceftriaxone  IV.  Blood and urine cultures obtained. MRI spine obtained and significant for metastatic cancer with likely renal primary. Decision made to consider hospice at home.   Assessment and Plan:  Pyelonephritis CT imaging on admission concerning for possible ascending UTI on left. Complicated by suprapubic catheter. Patient started empirically on Ceftriaxone  IV. Urine and blood cultures obtained on admission and are pending. Blood cultures are no growth to date and urine culture is significant for enterococcus faecalis and stenotrophomonas maltophilia. -Continue Amoxicillin  and Bactrim and treat for a total of 7-10 days  CAD Noted. -Continue aspirin   Prostate cancer Metastatic disease to multiple sites Patient is followed by urology. S/p suprapubic catheter placement.  Back pain Secondary to metastatic disease seen on MRI.  -Analgesics; palliative care consulted for symptom management  Likely renal cell carcinoma Likely bone metastasis Noted on imaging. Goals of care discussions on 5/12 and 5/13 with patient, step-sister and son (HCPOA). Patient to be referred to hospice at home. Will not pursue SNF discharge at this time. Focus will be on symptom management.   Normocytic anemia Mild. Stable.  Alzheimer's dementia Noted. -Continue Namenda  and Remeron   Severe malnutrition Noted on admission. -Dietitian recommendations  Primary hypertension -Continue amlodipine   Renal mass, right Hyperattenuating lesion along the lateral  interpolar right kidney measuring 2.7 cm concerning for renal cell carcinoma. Family unlikely to pursue workup based on discussion on admission.  Hyperlipidemia Aortic atherosclerosis Noted.     DVT prophylaxis: Lovenox  Code Status:   Code Status: Limited: Do not attempt resuscitation (DNR) -DNR-LIMITED -Do Not Intubate/DNI  Family Communication: None at bedside. Step daughter at bedside, son on telephone Disposition Plan: Discharge home with hospice once pain management improved, and safe discharge plan established   Consultants:  Palliative care medicine  Procedures:  None  Antimicrobials: Ceftriaxone  IV Bactrim Amoxicillin    Subjective: Pain is somewhat controlled with analgesics. No specific concerns. Patient [place]; talking about this brings a big smile to his face.  Objective: BP (!) 144/74 (BP Location: Left Arm)   Pulse 78   Temp 99.3 F (37.4 C) (Oral)   Resp 17   Ht 5\' 9"  (1.753 m)   Wt 55.7 kg   SpO2 96%   BMI 18.13 kg/m   Examination:  General exam: Appears calm and comfortable Respiratory system: Respiratory effort normal. Cardiovascular system: S1 & S2 heard, RRR. No murmurs. Gastrointestinal system: Abdomen is nondistended, soft and nontender. Normal bowel sounds heard. Central nervous system: Alert.   Data Reviewed: I have personally reviewed following labs and imaging studies  CBC Lab Results  Component Value Date   WBC 9.7 07/04/2023   RBC 4.11 (L) 07/04/2023   HGB 11.5 (L) 07/04/2023   HCT 35.9 (L) 07/04/2023   MCV 87.3 07/04/2023   MCH 28.0 07/04/2023   PLT 385 07/04/2023   MCHC 32.0 07/04/2023   RDW 14.4 07/04/2023   LYMPHSABS 2.2 07/03/2023   MONOABS 1.4 (H) 07/03/2023   EOSABS 0.2 07/03/2023   BASOSABS 0.1 07/03/2023     Last metabolic panel Lab Results  Component Value Date   NA 138 07/05/2023   K 4.0 07/05/2023   CL 104 07/05/2023   CO2 22 07/05/2023   BUN 23 07/05/2023   CREATININE 1.10  07/05/2023   GLUCOSE 112 (H) 07/05/2023   GFRNONAA >60 07/05/2023   GFRAA >60 03/22/2019   CALCIUM  8.8 (L) 07/05/2023   PHOS 2.1 (L) 05/15/2023   PROT 7.3 07/03/2023   ALBUMIN 3.2 (L) 07/03/2023   LABGLOB 2.3 04/14/2017   AGRATIO 2.0 04/14/2017   BILITOT 0.7 07/03/2023   ALKPHOS 85 07/03/2023   AST 22 07/03/2023   ALT 17 07/03/2023   ANIONGAP 12 07/05/2023    GFR: Estimated Creatinine Clearance: 34.5 mL/min (by C-G formula based on SCr of 1.1 mg/dL).  Recent Results (from the past 240 hours)  Urine Culture     Status: Abnormal   Collection Time: 07/03/23  4:29 PM   Specimen: Urine, Random  Result Value Ref Range Status   Specimen Description   Final    URINE, RANDOM Performed at [facility], [address], [place], [place] [address]    Special Requests   Final    NONE Reflexed from [id number] Performed at [facility], [address] [place], [place] [address]    Culture (A)  Final    50,000 COLONIES/mL ENTEROCOCCUS FAECALIS >=100,000 COLONIES/mL STENOTROPHOMONAS MALTOPHILIA    Report Status 07/06/2023 FINAL  Final   Organism ID, Bacteria ENTEROCOCCUS FAECALIS (A)  Final   Organism ID, Bacteria STENOTROPHOMONAS MALTOPHILIA (A)  Final      Susceptibility   Enterococcus faecalis - MIC*    AMPICILLIN  <=2 SENSITIVE Sensitive     NITROFURANTOIN <=16 SENSITIVE Sensitive     VANCOMYCIN  1 SENSITIVE Sensitive     * 50,000 COLONIES/mL ENTEROCOCCUS FAECALIS   Stenotrophomonas maltophilia - MIC*    LEVOFLOXACIN 1 SENSITIVE Sensitive     TRIMETH/SULFA <=20 SENSITIVE Sensitive     * >=100,000 COLONIES/mL STENOTROPHOMONAS MALTOPHILIA  Culture, blood (Routine X 2) w Reflex to ID Panel     Status: None (Preliminary result)   Collection Time: 07/03/23  5:55 PM   Specimen: BLOOD  Result Value Ref Range Status   Specimen Description   Final    BLOOD Performed at [facility], [address], [place], [place] [address]     Special Requests   Final    BOTTLES DRAWN AEROBIC AND ANAEROBIC Blood Culture adequate volume Performed at [facility], [address] [place], [place] [address]    Culture   Final    NO GROWTH 4 DAYS Performed at [facility], [address] [place], [place] [address]    Report Status PENDING  Incomplete  Culture, blood (Routine X 2) w Reflex to ID Panel     Status: None (Preliminary result)   Collection Time: 07/03/23  5:59 PM   Specimen: Right Antecubital; Blood  Result Value Ref Range Status   Specimen Description   Final    RIGHT ANTECUBITAL Performed at [facility], [address], [place], [place] [address]    Special Requests   Final    BOTTLES DRAWN AEROBIC AND ANAEROBIC Blood Culture results may not be optimal due to an inadequate volume of blood received in culture bottles Performed at [facility], [address], [place], [place] [address]    Culture   Final    NO GROWTH 4 DAYS Performed at [facility], [address], [place], [place] [address]    Report Status PENDING  Incomplete      Radiology Studies: MR THORACIC SPINE W WO CONTRAST Result Date: 07/06/2023 CLINICAL DATA:  Compression fracture of the thoracic spine. EXAM: MRI THORACIC WITHOUT AND WITH CONTRAST TECHNIQUE: Multiplanar and multiecho pulse sequences of the thoracic spine were obtained without and with intravenous contrast. CONTRAST:  5mL GADAVIST  GADOBUTROL  1 MMOL/ML IV SOLN COMPARISON:  None Available. FINDINGS: Alignment: Slight grade 1 anterolisthesis is present at T2-3 and T4-5. No other significant listhesis is present. Normal thoracic kyphosis is present. Vertebrae: Abnormal marrow signal and enhancement is present within the right side of the T2-T3 vertebral bodies extending into the posterior elements. Signal and enhancement is present the right transverse process T2 and T3. No extraforaminal tumor scratched at no extraosseous  tumor is present. Signal and enhancement is present C6 and C7 extending into the posterior elements bilaterally at C7. Enhancing lesion along the inferior endplate of T10 on the left measures 12 mm. Enhancing lesion along the posteroinferior endplate of T7 the right measures 10 mm. Tumor infiltration is present within the spinous process and lamina T6. No pathologic fractures are present. Cord:  Normal signal and morphology. Paraspinal and other soft tissues: No extraosseous tumor is present. The paraspinous soft tissues are within normal limits. The visualized lung fields are clear. Disc levels: The vertebral bodies are expanded at C6 and C7 which partially effaces the ventral CSF at C7-T1. Moderate foraminal narrowing is present bilaterally at C7-T1. T1-2: Facet spurring contributes to moderate left foraminal narrowing. T2-3: Tumor in anterolisthesis results in moderate right foraminal stenosis. T3-4: No significant stenosis. T4-5: Slight anterolisthesis is present without significant stenosis. Disc levels from T5-6 through T9-10 are normal. Asymmetric left-sided facet hypertrophy contributes to mild left foraminal narrowing at T10-11. Facet hypertrophy and mild foraminal narrowing bilaterally at T11-12 is worse left than right. IMPRESSION: 1. Abnormal marrow signal and enhancement within the right side of the T2-T3 vertebral bodies extending into the posterior elements. Signal and enhancement is present the right transverse process T2 and T3. Findings are consistent with metastatic disease. Multiple myeloma is considered in the differential. 2. Enhancing lesions along the inferior endplate of T10 on the left and posteroinferior endplate of T7 on the right are consistent with metastatic disease or multiple myeloma. 3. No pathologic fractures. 4. The vertebral bodies are expanded at C6 and C7 which partially effaces the ventral CSF at C7-T1. Moderate foraminal narrowing bilaterally at C7-T1. 5. Moderate left  foraminal narrowing at T1-2. 6. Moderate right foraminal stenosis at T2-3. 7. Mild left foraminal narrowing at T10-11. 8. Mild foraminal narrowing bilaterally at T11-12 is worse left than right. These results were called by telephone at the time of interpretation on 07/06/2023 at 4:25 pm to provider Floris Neuhaus , who verbally acknowledged these results. Electronically Signed   By: Audree Leas M.D.   On: 07/06/2023 16:25   MR LUMBAR SPINE W WO CONTRAST Result Date: 07/06/2023 CLINICAL DATA:  Compression fracture, lumbar. EXAM: MRI LUMBAR SPINE WITHOUT AND WITH CONTRAST TECHNIQUE: Multiplanar and multiecho pulse sequences of the lumbar spine were obtained without and with intravenous contrast. CONTRAST:  5mL GADAVIST  GADOBUTROL  1 MMOL/ML IV SOLN COMPARISON:  Lumbar spine radiographs 07/05/2023. CT of the abdomen and pelvis without contrast 07/03/2023. FINDINGS: Segmentation: 5 non rib-bearing lumbar type vertebral bodies are present. The lowest fully formed vertebral body is L5. Alignment: Slight grade 1 retrolisthesis is present at L1-2, L2-3 and L3-4. Grade 1 anterolisthesis at  L5-S1 measures 3 mm. Levoconvex curvature is centered at L4. Mild straightening of the normal lumbar lordosis is present. Vertebrae: Abnormal signal diffuse enhancement is present throughout the L4 vertebral body consistent with known metastatic disease. Signal changes enhancement extend into the inferior endplate of L3 anteriorly on the left. Subtle enhancement is also present along the superior endplate of L5. 16 mm enhancing lesion present on the right L1. Vertebral body heights are maintained. Conus medullaris and cauda equina: Conus extends to the L1 level. Conus and cauda equina appear normal. Paraspinal and other soft tissues: Enhancing mass lesion in the interpolar region of the right kidney again noted. Left-sided hydroureteronephrosis present. Disc levels: T12-L1: Normal disc signal and height is present. No focal  protrusion or stenosis is present. L1-2: Retrolisthesis and bone spurs contribute to moderate left and mild right foraminal stenosis. Nerve roots are crowded on the left due to scoliosis. L2-3: Uncovering of broad-based disc protrusion is present. Mild facet hypertrophy is noted. Central canal is patent. Moderate foraminal narrowing bilaterally. L3-4: Asymmetric right-sided facet hypertrophy is present. Broad-based disc protrusion is asymmetric to the right. This results in crowding of the central nerve roots with moderate central canal stenosis. Moderate right foraminal stenosis is present. Mild left foraminal is present. L4-5: A broad-based disc protrusion is present. Prominent epidural fat is noted. Crowding of the nerve roots is noted centrally. Moderate foraminal stenosis is present bilaterally, right greater than left. L5-S1: A broad-based disc protrusion is present. Mild subarticular narrowing is present bilaterally. Moderate left and mild right foraminal narrowing is present. IMPRESSION: 1. Abnormal signal and enhancement throughout the L4 vertebral body consistent with known metastatic disease. This is favored to represent renal cell metastatic disease. Prostate cancer or multiple myeloma is also considered. 2. 16 mm enhancing lesion on the right at L1 is consistent with metastatic disease. 3. Enhancing mass lesion in the interpolar region of the right kidney again noted, likely a primary renal cell carcinoma. 4. Left-sided hydroureteronephrosis. 5. Moderate left and mild right foraminal stenosis at L1-2. 6. Moderate foraminal narrowing bilaterally at L2-3. 7. Moderate central canal stenosis and moderate right foraminal stenosis at L3-4. 8. Moderate foraminal stenosis bilaterally at L4-5, right greater than left. 9. Mild subarticular narrowing bilaterally at L5-S1. 10. Moderate left and mild right foraminal stenosis at L5-S1. These results were called by telephone at the time of interpretation on 07/06/2023  at 4:25 pm to provider Francesco Provencal , who verbally acknowledged these results. Electronically Signed   By: Audree Leas M.D.   On: 07/06/2023 16:25   DG Thoracic Spine 2 View Result Date: 07/05/2023 CLINICAL DATA:  91320 Back pain [id number] EXAM: THORACIC SPINE 2 VIEWS; LUMBAR SPINE - 2-3 VIEW COMPARISON:  CT renal 07/03/2023, CT chest abdomen pelvis 01/25/2023 FINDINGS: Limited evaluation due to overlapping osseous structures and overlying soft tissues. Diffusely decreased bone density. Age-indeterminate interval worsening of vertebral body height loss of the thoracolumbar spine. Multilevel severe degenerative changes of the spine. There is no evidence of thoracolumbar acute displaced spine fracture. Stable grade 1 anterolisthesis of L5 on S1. No other significant bone abnormalities are identified. Surgical clips consistent with lymph node dissection of the pelvis. Atherosclerotic plaque. IMPRESSION: Age-indeterminate interval worsening of vertebral body height loss of the thoracolumbar spine. Correlate with point tenderness to palpation for an acute component. Electronically Signed   By: Morgane  Naveau M.D.   On: 07/05/2023 20:18   DG Lumbar Spine 2-3 Views Result Date: 07/05/2023 CLINICAL DATA:  91320 Back pain [id number]  EXAM: THORACIC SPINE 2 VIEWS; LUMBAR SPINE - 2-3 VIEW COMPARISON:  CT renal 07/03/2023, CT chest abdomen pelvis 01/25/2023 FINDINGS: Limited evaluation due to overlapping osseous structures and overlying soft tissues. Diffusely decreased bone density. Age-indeterminate interval worsening of vertebral body height loss of the thoracolumbar spine. Multilevel severe degenerative changes of the spine. There is no evidence of thoracolumbar acute displaced spine fracture. Stable grade 1 anterolisthesis of L5 on S1. No other significant bone abnormalities are identified. Surgical clips consistent with lymph node dissection of the pelvis. Atherosclerotic plaque. IMPRESSION: Age-indeterminate  interval worsening of vertebral body height loss of the thoracolumbar spine. Correlate with point tenderness to palpation for an acute component. Electronically Signed   By: Morgane  Naveau M.D.   On: 07/05/2023 20:18      LOS: 3 days    Aneita Keens, MD [facility] 07/07/2023, 11:10 AM   If 7PM-7AM, please contact night-coverage [email or web address]

## 2023-07-07 NOTE — Progress Notes (Signed)
   07/07/23 1553  Spiritual Encounters  Type of Visit Initial  Conversation partners present during encounter Nurse  Referral source Chaplain team  Reason for visit Routine spiritual support  OnCall Visit No   Reported to patient's room to offer spiritual care to patient and family.  Patient was asleep and no family at bedside at this time. Preparation are being made with the son for hospice services to be rendered at the home. Son not present to discuss.

## 2023-07-07 NOTE — Evaluation (Addendum)
 Occupational Therapy Evaluation Patient Details Name: Andre Casey. MRN: [id number] DOB: 12-08-1929 Today's Date: 07/07/2023   History of Present Illness   Andre Casey. is a 89 y.o. male admitted with back pain. PMH: HTN, hypercholesterolemia, dementia, CAD, asthma, prostate CA and chronic suprapubic catheter     Clinical Impressions Evaluation was completed prior to MD d/c orders. Patient was CGA for rolling in bed with pain limiting patient from tolerating further movement. Patient will need 24/7 caregiver support in next level of care. MD seen in hallway after evaluation and consulted over patients status. MD indicating no further therapy needs at this time.      If plan is discharge home, recommend the following:   A lot of help with bathing/dressing/bathroom;A lot of help with walking and/or transfers;Assistance with cooking/housework;Assist for transportation;Help with stairs or ramp for entrance;Direct supervision/assist for financial management;Direct supervision/assist for medications management     Functional Status Assessment   Patient has had a recent decline in their functional status and/or demonstrates limited ability to make significant improvements in function in a reasonable and predictable amount of time       Precautions/Restrictions   Precautions Precautions: Fall;Back Recall of Precautions/Restrictions: Impaired Restrictions Weight Bearing Restrictions Per Provider Order: No     Mobility Bed Mobility Overal bed mobility: Needs Assistance Bed Mobility: Rolling Rolling: Contact guard assist                     ADL either performed or assessed with clinical judgement   ADL Overall ADL's : Needs assistance/impaired Eating/Feeding: Set up;Bed level Eating/Feeding Details (indicate cue type and reason): for self feeding patient reported fingers are numb. Grooming: Bed level;Moderate assistance   Upper Body Bathing:  Bed level;Maximal assistance   Lower Body Bathing: Bed level;Total assistance   Upper Body Dressing : Bed level;Maximal assistance   Lower Body Dressing: Bed level;Total assistance     Toilet Transfer Details (indicate cue type and reason): uanble to progress to EOB at this time with increased pain with attempts to get feet over EOB. Toileting- Clothing Manipulation and Hygiene: Bed level;Total assistance Toileting - Clothing Manipulation Details (indicate cue type and reason): rolling to change linens at bed level with cues for proper positioning and CGA for rolling tasks. able to maintain sidelying with supervision                          Pertinent Vitals/Pain Pain Assessment Pain Assessment: Faces Faces Pain Scale: Hurts worst Pain Location: with movement Pain Descriptors / Indicators: Grimacing, Constant, Sharp Pain Intervention(s): Limited activity within patient's tolerance, Monitored during session, Repositioned, Premedicated before session     Extremity/Trunk Assessment Upper Extremity Assessment Upper Extremity Assessment:  (ROM WFL, noted to have thumb on R hand pointing posteriorly secondary to arthritis per patient report)           Communication Communication Communication: No apparent difficulties   Cognition Arousal: Alert Behavior During Therapy: WFL for tasks assessed/performed               OT - Cognition Comments: patient was plesantly confused. patient reported today was monday.                 Following commands: Intact                  Home Living Family/patient expects to be discharged to:: Private residence Living Arrangements: Alone Available Help at Discharge:  Available PRN/intermittently;Family Type of Home: House Home Access: Level entry     Home Layout: One level     Bathroom Shower/Tub: Tub/shower unit         Home Equipment: Shower seat - built in;Rollator (4 wheels);Cane - single point;BSC/3in1;Grab  bars - tub/shower;Grab bars - toilet;Hand held shower head;Wheelchair - Forensic psychologist (2 wheels)   Additional Comments: patient reported having caregivers present for 9-3 week days at home. helping with IADLs.      Prior Functioning/Environment Prior Level of Function : Independent/Modified Independent;Patient poor historian/Family not available               ADLs Comments: patient reported having caregivers present during the day but not at night. reported family lives near by. patient reported bathing himself while people were present. independent for toileting tasks.            OT Goals(Current goals can be found in the care plan section)   Acute Rehab OT Goals OT Goal Formulation: All assessment and education complete, DC therapy   OT Frequency:          AM-PAC OT "6 Clicks" Daily Activity     Outcome Measure Help from another person eating meals?: A Little Help from another person taking care of personal grooming?: A Lot Help from another person toileting, which includes using toliet, bedpan, or urinal?: Total Help from another person bathing (including washing, rinsing, drying)?: Total Help from another person to put on and taking off regular upper body clothing?: Total Help from another person to put on and taking off regular lower body clothing?: Total 6 Click Score: 9   End of Session Nurse Communication: Other (comment) (called into room to assess wetness around suprapubic cath)  Activity Tolerance: Patient limited by pain Patient left: in bed;with call bell/phone within reach;with bed alarm set;Other (comment) (nurse and NT in room)  OT Visit Diagnosis: Pain                Time: 1610-9604 OT Time Calculation (min): 22 min Charges:  OT General Charges $OT Visit: 1 Visit OT Evaluation $OT Eval Low Complexity: 1 Low  Aymee Fomby OTR/L, MS Acute Rehabilitation Department Office# [phone]   Jame Maze 07/07/2023, 12:25 PM

## 2023-07-07 NOTE — Progress Notes (Signed)
 PT Cancellation Note  Patient Details Name: Andre Casey. MRN: [id number] DOB: 1929-08-12   Cancelled Treatment:    Reason Eval/Treat Not Completed: PT screened, no needs identified, will sign off Patient to  go home with Hospice.  Abelina Hoes PT Acute Rehabilitation Services Office [phone]    Dareen Ebbing 07/07/2023, 12:20 PM

## 2023-07-08 DIAGNOSIS — C61 Malignant neoplasm of prostate: Secondary | ICD-10-CM

## 2023-07-08 DIAGNOSIS — Z7189 Other specified counseling: Secondary | ICD-10-CM

## 2023-07-08 DIAGNOSIS — N2889 Other specified disorders of kidney and ureter: Secondary | ICD-10-CM

## 2023-07-08 DIAGNOSIS — E785 Hyperlipidemia, unspecified: Secondary | ICD-10-CM | POA: Diagnosis not present

## 2023-07-08 DIAGNOSIS — E43 Unspecified severe protein-calorie malnutrition: Secondary | ICD-10-CM

## 2023-07-08 DIAGNOSIS — N12 Tubulo-interstitial nephritis, not specified as acute or chronic: Secondary | ICD-10-CM | POA: Diagnosis not present

## 2023-07-08 LAB — CULTURE, BLOOD (ROUTINE X 2)
Culture: NO GROWTH
Culture: NO GROWTH
Special Requests: ADEQUATE

## 2023-07-08 MED ORDER — PANTOPRAZOLE SODIUM 20 MG PO TBEC
20.0000 mg | DELAYED_RELEASE_TABLET | Freq: Every day | ORAL | Status: DC
  Administered 2023-07-08 – 2023-07-13 (×6): 20 mg via ORAL
  Filled 2023-07-08 (×6): qty 1

## 2023-07-08 MED ORDER — ACETAMINOPHEN 325 MG PO TABS
650.0000 mg | ORAL_TABLET | Freq: Three times a day (TID) | ORAL | Status: DC
  Administered 2023-07-08 – 2023-07-13 (×17): 650 mg via ORAL
  Filled 2023-07-08 (×17): qty 2

## 2023-07-08 MED ORDER — MORPHINE SULFATE 10 MG/5ML PO SOLN: 5.0000 mg | ORAL | Status: DC | PRN

## 2023-07-08 MED ORDER — MORPHINE SULFATE ER 15 MG PO TBCR
15.0000 mg | EXTENDED_RELEASE_TABLET | Freq: Two times a day (BID) | ORAL | Status: DC
  Administered 2023-07-08 – 2023-07-13 (×11): 15 mg via ORAL
  Filled 2023-07-08 (×11): qty 1

## 2023-07-08 MED ORDER — LIDOCAINE 5 % EX PTCH
1.0000 | MEDICATED_PATCH | CUTANEOUS | Status: DC
  Administered 2023-07-08 – 2023-07-13 (×6): 1 via TRANSDERMAL
  Filled 2023-07-08 (×6): qty 1

## 2023-07-08 MED ORDER — SENNA 8.6 MG PO TABS
1.0000 | ORAL_TABLET | Freq: Every day | ORAL | Status: DC
  Administered 2023-07-08 – 2023-07-13 (×6): 8.6 mg via ORAL
  Filled 2023-07-08 (×6): qty 1

## 2023-07-08 MED ORDER — DEXAMETHASONE 4 MG PO TABS
2.0000 mg | ORAL_TABLET | Freq: Every day | ORAL | Status: DC
  Administered 2023-07-08 – 2023-07-13 (×6): 2 mg via ORAL
  Filled 2023-07-08 (×6): qty 1

## 2023-07-08 MED ORDER — ACETAMINOPHEN 325 MG PO TABS: 650.0000 mg | ORAL_TABLET | Freq: Three times a day (TID) | ORAL | Status: DC

## 2023-07-08 NOTE — Progress Notes (Signed)
 PROGRESS NOTE  Andre Casey.  DOB: Jul 15, 1929  PCP: Andre Landau, MD YNW:[id number]  DOA: 07/03/2023  LOS: 4 days  Hospital Day: 6  Brief narrative: Andre Arias. is a 89 y.o. male with PMH significant for advanced prostate cancer s/p suprapubic catheter; dementia, HTN, HLD 5/9, patient presented with fever and back pain.  Urinalysis showed clear amber color urine with positive nitrite, rare bacteria CT scan showed evidence of left ascending pyelonephritis He was started on IV antibiotics  Admitted to hospitalist service. Urine culture on admission showed more than 100,000 CFU per mL of stenotrophomonas maltophilia as well as Enterococcus faecalis. 5/12, MRI thoracic and lumbar spine showed findings suggestive of metastatic cancer to multiple vertebras with likely renal primary based on the finding of 2.7 cm left renal mass in CT abdomen. After discussion of MRI findings with patient and family by previous hospitalist, family made a choice to stop further workup and transition to hospice care. Palliative care consulted  Subjective: Patient was seen and examined this morning. Alert, awake, not in distress.  Family not at bedside. Chart reviewed Remains afebrile, hemodynamically stable  Assessment and plan: Left pyelonephritis Suprapubic catheter status Present with fever, back pain CT scan suggestive of left ascending pyonephritis Urine culture on admission showed more than 100,000 CFU per mL of stenotrophomonas maltophilia as well as Enterococcus faecalis. 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 Possibility of new renal cancer with mets was discussed with  patient and family by previous hospitalist.  Family decided not at to pursue further workup and chose hospice care. Pain regimen --- Scheduled: MS Contin  15 mg twice daily, Tylenol  650 mg 3 times daily, lidocaine  patch, dexamethasone  2 mg daily, --- PRN: Morphine  10 mg oral every 2 hours  Alzheimer's dementia Supportive care Continue Namenda  and primidone  Hypertension Continue amlodipine    CAD, HLD Continue aspirin ,  Severe malnutrition Dietitian consulted   Mobility: Encourage ambulation  Goals of care   Code Status: Limited: Do not attempt resuscitation (DNR) -DNR-LIMITED -Do Not Intubate/DNI      DVT prophylaxis:  enoxaparin  (LOVENOX ) injection 40 mg Start: 07/06/23 1000   Antimicrobials: Bactrim and amoxicillin  Fluid: None Consultants: Palliative care Family Communication: None at side  Status: Inpatient Level of care:  Med-Surg   Patient is from: Home Needs to continue in-hospital care: Pending disposition plan.  Not a candidate for residential hospice.    Diet:  Diet Order             Diet regular Room service appropriate? Yes; Fluid consistency: Thin  Diet effective now                   Scheduled Meds:  acetaminophen   650 mg Oral TID   acidophilus  1 capsule Oral Q lunch   amLODipine   10 mg Oral QHS   amoxicillin   500 mg Oral Q8H   aspirin  EC  81 mg Oral QHS   dexamethasone   2 mg Oral Daily   enoxaparin  (LOVENOX ) injection  40 mg Subcutaneous Q24H   feeding supplement  237 mL Oral BID BM   lidocaine   1 patch  Transdermal Q24H   memantine   10 mg Oral BID   mirtazapine   15 mg Oral QHS   morphine   15 mg Oral Q12H   multivitamin with minerals  1 tablet Oral Daily   pantoprazole  20 mg Oral Daily   senna  1 tablet Oral Daily   sulfamethoxazole-trimethoprim  1 tablet Oral Q12H    PRN meds: guaiFENesin-dextromethorphan, ipratropium-albuterol , metoprolol tartrate, morphine , ondansetron  **OR** ondansetron  (ZOFRAN ) IV, polyethylene glycol    Infusions:    Antimicrobials: Anti-infectives (From admission, onward)    Start     Dose/Rate Route Frequency Ordered Stop   07/06/23 1000  sulfamethoxazole-trimethoprim (BACTRIM) 400-80 MG per tablet 1 tablet        1 tablet Oral Every 12 hours 07/06/23 0848     07/06/23 1000  amoxicillin  (AMOXIL ) capsule 500 mg        500 mg Oral Every 8 hours 07/06/23 0848     07/04/23 1800  cefTRIAXone  (ROCEPHIN ) 1 g in sodium chloride  0.9 % 100 mL IVPB  Status:  Discontinued        1 g 200 mL/hr over 30 Minutes Intravenous Every 24 hours 07/03/23 2128 07/06/23 0848   07/03/23 1730  cefTRIAXone  (ROCEPHIN ) 1 g in sodium chloride  0.9 % 100 mL IVPB        1 g 200 mL/hr over 30 Minutes Intravenous  Once 07/03/23 1715 07/03/23 1822       Objective: Vitals:   07/07/23 2312 07/08/23 0552  BP: 129/66 130/88  Pulse:  77  Resp:  20  Temp:  98.9 F (37.2 C)  SpO2:  94%    Intake/Output Summary (Last 24 hours) at 07/08/2023 1410 Last data filed at 07/08/2023 0700 Gross per 24 hour  Intake --  Output 900 ml  Net -900 ml   Filed Weights   07/04/23 0217  Weight: 55.7 kg   Weight change:  Body mass index is 18.13 kg/m.   Physical Exam: General exam: Pleasant, elderly Caucasian male.  Not in distress currently Skin: No rashes, lesions or ulcers. HEENT: Atraumatic, normocephalic, no obvious bleeding Lungs: Clear to auscultation bilaterally,  CVS: S1, S2, no murmur,   GI/Abd: Soft, nontender, nondistended, bowel sound present,   CNS: Alert, awake, able to have a short conversation Psychiatry: Cheerful Extremities: No pedal edema, no calf tenderness,   Data Review: I have personally reviewed the laboratory data and studies available.  F/u labs ordered Unresulted Labs (From admission, onward)     Start     Ordered   07/10/23 0500  Creatinine, serum  (enoxaparin  (LOVENOX )    CrCl >/= 30 ml/min)  Weekly,   R     Comments: while on enoxaparin  therapy    07/03/23 2128            Signed, Hoyt Macleod, MD [facility] 07/08/2023

## 2023-07-08 NOTE — Consult Note (Signed)
 Consultation Note Date: 07/08/2023   Patient Name: Andre Casey.  DOB: 04-19-1929  MRN: [id number]  Age / Sex: 89 y.o., male  PCP: Glena Landau, MD Referring Physician: Hoyt Macleod, MD  Reason for Consultation:  pain management  HPI/Patient Profile: 89 y.o. male  with past medical history of prostate cancer (s/p prostatectomy, was on Eligard, moderate Alzheimer's dementia, HTN, HLD admitted on 07/03/2023 with back pain- being treated for UTI. Has been referred to hospice by attending team. Palliative consulted for pain management.  Primary Decision Maker HCPOA - HCPOA document on chart reviewed- Morrill Steenbergen, Dorothey Gate, and Soyla Duverney are listed HCPOAs  Discussion: Chart reviewed including labs, progress notes, imaging from this and previous encounters.  Noted MRI indicating likely metastatic lesions to the L1 and L4 as well as renal mass. Concern for metastatic renal cancer.  Patient has been taking norco 2-3 times per day. He was in too much pain to participate in occupational therapy. On eval he reports back pain that limits his ability to move. He does not have an appetite. Pain medication helps temporarily. He is currently in severe pain.  He lives at home alone. Prior to admission he was able to ambulate in his home. He had paid caregivers and his Granddaughter also visited him frequently.  I called his son- Zayvian Gewirtz and left message requesting a return call.  Called daughterManley Seeds, she was indisposed and asked for a return call later today. I was able to connect with Manley Seeds- she shares that she and family understand patient's diagnosis and agree with plan for hospice.  Patient doesn't have 24 hour care at home. OT eval indicated need for 24 hour care.  Patient not likely eligible for inpatient hospice.  Manley Seeds states that best contact person is Ottie Blonder.     SUMMARY OF  RECOMMENDATIONS -Lidocaine  patch to back -MS Contin  15mg  BID  -Morphine  solution 10mg  q2hr prn -Acetaminphen 650mg  po TID -Dexamethasone  2mg  po daily -Pantoprazole 20mg  po daily -senna 1 tab daily for bowel prophylaxis -GOC are to d/c home with hospice- however, pt does not have 24 hr care at home- will defer disposition issues to [facility] and hospice- per Manley Seeds- best contact person to discuss disposition issues with is Soyla Duverney- d/t patient's son is unable to be contacted during the day at his job    Code Status/Advance Care Planning:   Code Status: Limited: Do not attempt resuscitation (DNR) -DNR-LIMITED -Do Not Intubate/DNI     Prognosis:   < 6 months  Discharge Planning: Home with Hospice  Primary Diagnoses: Present on Admission:  Pyelonephritis  Infection of bladder catheter (HCC)  Prostate cancer metastatic to multiple sites (HCC)  Moderate late onset Alzheimer's dementia (HCC)  Normocytic anemia  Protein-calorie malnutrition, severe  Essential hypertension  Dyslipidemia, goal LDL below 70  Renal mass, right   Review of Systems  Constitutional:  Positive for appetite change.    Physical Exam Vitals and nursing note reviewed.  Constitutional:      General: He is  not in acute distress. Cardiovascular:     Rate and Rhythm: Normal rate.  Pulmonary:     Effort: Pulmonary effort is normal.  Neurological:     Mental Status: He is alert.     Vital Signs: BP 130/88 (BP Location: Left Arm)   Pulse 77   Temp 98.9 F (37.2 C) (Oral)   Resp 20   Ht 5\' 9"  (1.753 m)   Wt 55.7 kg   SpO2 94%   BMI 18.13 kg/m  Pain Scale: 0-10 POSS *See Group Information*: S-Acceptable,Sleep, easy to arouse Pain Score: 8    SpO2: SpO2: 94 % O2 Device:SpO2: 94 % O2 Flow Rate: .   IO: Intake/output summary:  Intake/Output Summary (Last 24 hours) at 07/08/2023 1026 Last data filed at 07/08/2023 0700 Gross per 24 hour  Intake --  Output 900 ml  Net -900 ml    LBM: Last BM  Date : 07/06/23 Baseline Weight: Weight: 55.7 kg Most recent weight: Weight: 55.7 kg       Thank you for this consult. Palliative medicine will continue to follow and assist as needed.  Time Total: 60 minutes Signed by: Micki Alas, AGNP-C Palliative Medicine  Time includes:   Preparing to see the patient (e.g., review of tests) Obtaining and/or reviewing separately obtained history Performing a medically necessary appropriate examination and/or evaluation Counseling and educating the patient/family/caregiver Ordering medications, tests, or procedures Referring and communicating with other health care professionals (when not reported separately) Documenting clinical information in the electronic or other health record Independently interpreting results (not reported separately) and communicating results to the patient/family/caregiver Care coordination (not reported separately) Clinical documentation   Please contact Palliative Medicine Team phone at [phone] for questions and concerns.  For individual provider: See Tilford Foley

## 2023-07-08 NOTE — Plan of Care (Signed)

## 2023-07-09 DIAGNOSIS — G893 Neoplasm related pain (acute) (chronic): Secondary | ICD-10-CM | POA: Diagnosis not present

## 2023-07-09 DIAGNOSIS — N2889 Other specified disorders of kidney and ureter: Secondary | ICD-10-CM | POA: Diagnosis not present

## 2023-07-09 DIAGNOSIS — Z515 Encounter for palliative care: Secondary | ICD-10-CM

## 2023-07-09 DIAGNOSIS — C7951 Secondary malignant neoplasm of bone: Secondary | ICD-10-CM

## 2023-07-09 DIAGNOSIS — N12 Tubulo-interstitial nephritis, not specified as acute or chronic: Secondary | ICD-10-CM | POA: Diagnosis not present

## 2023-07-09 NOTE — Plan of Care (Signed)

## 2023-07-09 NOTE — Progress Notes (Signed)
 Daily Progress Note   Patient Name: Andre Casey.       Date: 07/09/2023 DOB: 1929/05/08  Age: 89 y.o. MRN#: [id number] Attending Physician: Hoyt Macleod, MD Primary Care Physician: Glena Landau, MD Admit Date: 07/03/2023  Reason for Consultation/Follow-up: Pain control  Patient Profile/HPI:   89 y.o. male  with past medical history of prostate cancer (s/p prostatectomy, was on Eligard, moderate Alzheimer's dementia, HTN, HLD admitted on 07/03/2023 with back pain- being treated for UTI. Has been referred to hospice by attending team. Palliative consulted for pain management.   Subjective: Chart reviewed including labs, progress notes, imaging from this and previous encounters.  Medication use reviewed- has not required PRN medication in the last 24 hours.  On eval, patient awake, and alert. Feels pain is well controlled.   ROS   Physical Exam Vitals and nursing note reviewed.  Constitutional:      Comments: frail  Cardiovascular:     Rate and Rhythm: Normal rate.  Pulmonary:     Effort: Pulmonary effort is normal.  Skin:    General: Skin is warm and dry.     Coloration: Skin is pale.  Neurological:     Mental Status: He is alert. Mental status is at baseline.             Vital Signs: BP 130/76 (BP Location: Left Arm)   Pulse 74   Temp 99.5 F (37.5 C) (Oral)   Resp 16   Ht 5\' 9"  (1.753 m)   Wt 55.7 kg   SpO2 95%   BMI 18.13 kg/m  SpO2: SpO2: 95 % O2 Device: O2 Device: Room Air O2 Flow Rate:    Intake/output summary:  Intake/Output Summary (Last 24 hours) at 07/09/2023 1335 Last data filed at 07/09/2023 0407 Gross per 24 hour  Intake --  Output 825 ml  Net -825 ml   LBM: Last BM Date : 07/08/23 Baseline Weight: Weight: 55.7 kg Most recent weight:  Weight: 55.7 kg       Palliative Assessment/Data: PPS: 50%      Patient Active Problem List   Diagnosis Date Noted   Pyelonephritis 07/03/2023   Enterococcus faecalis infection 05/15/2023   Enterococcal bacteremia 05/15/2023   Sepsis (HCC) 05/13/2023   Generalized weakness 01/23/2023   Inadequate community resources 10/17/2021   AKI (  acute kidney injury) (HCC) 07/30/2021   Anemia of chronic disease 07/30/2021   Protein-calorie malnutrition, severe 07/06/2021   Right wrist pain 07/04/2021   UTI (urinary tract infection) 07/03/2021   Hypokalemia 07/03/2021   Palliative care encounter 06/18/2021   Physical deconditioning 04/28/2021   Bandemia 04/27/2021   Essential hypertension 04/26/2021   Infection of bladder catheter (HCC) 04/25/2021   Hyponatremia 04/25/2021   Normocytic anemia 04/25/2021   Pancreatic mass 04/25/2021   Lab test positive for detection of COVID-19 virus 04/15/2021   Prostate cancer metastatic to multiple sites (HCC) 04/15/2021   Chronic indwelling Foley catheter 04/15/2021   Moderate late onset Alzheimer's dementia (HCC) 04/08/2021   Gait abnormality 04/08/2021   Renal insufficiency 03/12/2021   Acute lower UTI 04/30/2020   Renal mass, right 12/04/2019   LBBB (left bundle branch block) 06/07/2019   Family history of Alzheimer's disease 03/25/2018   Atherosclerotic heart disease of native coronary artery with angina pectoris (HCC) 01/15/2016   CAD-S/P PCI/DES 01/15/2016   Family history of sudden cardiac death Jan 12, 2016   Dyslipidemia, goal LDL below 70 01/08/2016   Paresthesia 07/18/2015   S/P shoulder replacement 04/20/2015   Malignant neoplasm of prostate (HCC) 02/09/2015   Urethral stricture 07/25/2013   History of prostate cancer 08/04/2011   ED (erectile dysfunction) of organic origin 02/03/2011   Male urinary stress incontinence 01/31/2011   External hemorrhoids 08/02/2007   Incontinence of feces 08/02/2007   History of colonic polyps  08/02/2007    Palliative Care Assessment & Plan    Assessment/Recommendations/Plan  Patient feels pain is well controlled with current interventions Plan for d/c with hospice- unfortunately, he does not have anyone to provide 24hr care at home- will defer disposition plan to hospice/TOC.    Code Status:   Code Status: Limited: Do not attempt resuscitation (DNR) -DNR-LIMITED -Do Not Intubate/DNI    Prognosis:  < 6 months  Discharge Planning: To Be Determined  Care plan was discussed with patient.   Thank you for allowing the Palliative Medicine Team to assist in the care of this patient.  Total time:  Prolonged billing:  Time includes:   Preparing to see the patient (e.g., review of tests) Obtaining and/or reviewing separately obtained history Performing a medically necessary appropriate examination and/or evaluation Counseling and educating the patient/family/caregiver Ordering medications, tests, or procedures Referring and communicating with other health care professionals (when not reported separately) Documenting clinical information in the electronic or other health record Independently interpreting results (not reported separately) and communicating results to the patient/family/caregiver Care coordination (not reported separately) Clinical documentation  Micki Alas, AGNP-C Palliative Medicine   Please contact Palliative Medicine Team phone at [phone] for questions and concerns.

## 2023-07-09 NOTE — Progress Notes (Signed)
 PROGRESS NOTE  Andre Casey.  DOB: 08-Dec-1929  PCP: Glena Landau, MD OZH:[id number]  DOA: 07/03/2023  LOS: 5 days  Hospital Day: 7  Brief narrative: Andre Licon. is a 89 y.o. male with PMH significant for advanced prostate cancer s/p suprapubic catheter; dementia, HTN, HLD 5/9, patient presented with fever and back pain.  Urinalysis showed clear amber color urine with positive nitrite, rare bacteria CT scan showed evidence of left ascending pyelonephritis He was started on IV antibiotics  Admitted to hospitalist service. Urine culture on admission showed more than 100,000 CFU per mL of stenotrophomonas maltophilia as well as Enterococcus faecalis. 5/12, MRI thoracic and lumbar spine showed findings suggestive of metastatic cancer to multiple vertebras with likely renal primary based on the finding of 2.7 cm left renal mass in CT abdomen. After discussion of MRI findings with patient and family by previous hospitalist, family made a choice to stop further workup and transition to hospice care. Palliative care consulted  Subjective: Patient was seen and examined this morning. Lying on bed.  Not in distress no family at bedside.  Pending SNF.  Assessment and plan: Left pyelonephritis Suprapubic catheter status Present with fever, back pain CT scan suggestive of left ascending pyonephritis Urine culture on admission showed more than 100,000 CFU per mL of stenotrophomonas maltophilia as well as Enterococcus faecalis. 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 Possibility of new renal cancer with mets was discussed with patient and family by previous hospitalist.   Family decided not at to pursue further workup and chose hospice care. Pain regimen --- Scheduled: MS Contin  15 mg twice daily, Tylenol  650 mg 3 times daily, lidocaine  patch, dexamethasone  2 mg daily, --- PRN: Morphine  10 mg oral every 2 hours.  Not been using at all.  I will DC IV morphine  order  Alzheimer's dementia Supportive care Continue Namenda  and primidone  Hypertension Continue amlodipine    CAD, HLD Continue aspirin ,  Severe malnutrition Dietitian consulted   Mobility: Encourage ambulation  Goals of care   Code Status: Limited: Do not attempt resuscitation (DNR) -DNR-LIMITED -Do Not Intubate/DNI      DVT prophylaxis:  enoxaparin  (LOVENOX ) injection 40 mg Start: 07/06/23 1000   Antimicrobials: Bactrim and amoxicillin  Fluid: None Consultants: Palliative care Family Communication: None at side  Status: Inpatient Level of care:  Med-Surg   Patient is from: Home Needs to continue in-hospital care: Pending disposition plan.  Not a candidate for residential hospice.  SNF with hospice likely    Diet:  Diet Order             Diet regular Room service appropriate? Yes; Fluid consistency: Thin  Diet effective now                   Scheduled Meds:  acetaminophen   650 mg Oral TID   acidophilus  1 capsule Oral Q lunch   amLODipine   10 mg Oral QHS   amoxicillin   500 mg Oral Q8H   aspirin  EC  81 mg Oral QHS   dexamethasone   2 mg Oral Daily   enoxaparin  (LOVENOX ) injection  40 mg Subcutaneous Q24H  feeding supplement  237 mL Oral BID BM   lidocaine   1 patch Transdermal Q24H   memantine   10 mg Oral BID   mirtazapine   15 mg Oral QHS   morphine   15 mg Oral Q12H   multivitamin with minerals  1 tablet Oral Daily   pantoprazole  20 mg Oral Daily   senna  1 tablet Oral Daily   sulfamethoxazole-trimethoprim  1 tablet Oral Q12H    PRN meds: guaiFENesin-dextromethorphan, ipratropium-albuterol , metoprolol tartrate, ondansetron  **OR** ondansetron  (ZOFRAN ) IV,  polyethylene glycol   Infusions:    Antimicrobials: Anti-infectives (From admission, onward)    Start     Dose/Rate Route Frequency Ordered Stop   07/06/23 1000  sulfamethoxazole-trimethoprim (BACTRIM) 400-80 MG per tablet 1 tablet        1 tablet Oral Every 12 hours 07/06/23 0848     07/06/23 1000  amoxicillin  (AMOXIL ) capsule 500 mg        500 mg Oral Every 8 hours 07/06/23 0848     07/04/23 1800  cefTRIAXone  (ROCEPHIN ) 1 g in sodium chloride  0.9 % 100 mL IVPB  Status:  Discontinued        1 g 200 mL/hr over 30 Minutes Intravenous Every 24 hours 07/03/23 2128 07/06/23 0848   07/03/23 1730  cefTRIAXone  (ROCEPHIN ) 1 g in sodium chloride  0.9 % 100 mL IVPB        1 g 200 mL/hr over 30 Minutes Intravenous  Once 07/03/23 1715 07/03/23 1822       Objective: Vitals:   07/09/23 0428 07/09/23 0715  BP: 128/76 130/76  Pulse: 72 74  Resp: 14 16  Temp: 98.7 F (37.1 C) 99.5 F (37.5 C)  SpO2: 95% 95%    Intake/Output Summary (Last 24 hours) at 07/09/2023 1339 Last data filed at 07/09/2023 0407 Gross per 24 hour  Intake --  Output 825 ml  Net -825 ml   Filed Weights   07/04/23 0217  Weight: 55.7 kg   Weight change:  Body mass index is 18.13 kg/m.   Physical Exam: General exam: Pleasant, elderly Caucasian male.  Not in distress currently Skin: No rashes, lesions or ulcers. HEENT: Atraumatic, normocephalic, no obvious bleeding Lungs: Clear to auscultation bilaterally,  CVS: S1, S2, no murmur,   GI/Abd: Soft, nontender, nondistended, bowel sound present,   CNS: Alert, awake, able to have a short conversation Psychiatry: Cheerful Extremities: No pedal edema, no calf tenderness,   Data Review: I have personally reviewed the laboratory data and studies available.  F/u labs ordered Unresulted Labs (From admission, onward)     Start     Ordered   07/10/23 0500  Creatinine, serum  (enoxaparin  (LOVENOX )    CrCl >/= 30 ml/min)  Weekly,   R     Comments: while on enoxaparin   therapy    07/03/23 2128           Signed, Hoyt Macleod, MD [facility] 07/09/2023

## 2023-07-10 DIAGNOSIS — N12 Tubulo-interstitial nephritis, not specified as acute or chronic: Secondary | ICD-10-CM | POA: Diagnosis not present

## 2023-07-10 DIAGNOSIS — G893 Neoplasm related pain (acute) (chronic): Secondary | ICD-10-CM | POA: Diagnosis not present

## 2023-07-10 DIAGNOSIS — E43 Unspecified severe protein-calorie malnutrition: Secondary | ICD-10-CM | POA: Diagnosis not present

## 2023-07-10 DIAGNOSIS — N2889 Other specified disorders of kidney and ureter: Secondary | ICD-10-CM | POA: Diagnosis not present

## 2023-07-10 LAB — CREATININE, SERUM
Creatinine, Ser: 1.58 mg/dL — ABNORMAL HIGH (ref 0.61–1.24)
GFR, Estimated: 41 mL/min — ABNORMAL LOW (ref >60)

## 2023-07-10 MED ORDER — ENOXAPARIN SODIUM 30 MG/0.3ML IJ SOSY
30.0000 mg | PREFILLED_SYRINGE | INTRAMUSCULAR | Status: DC
Start: 2023-07-10 — End: 2023-07-13
  Administered 2023-07-10 – 2023-07-13 (×4): 30 mg via SUBCUTANEOUS
  Filled 2023-07-10 (×4): qty 0.3

## 2023-07-10 MED ORDER — ALUM & MAG HYDROXIDE-SIMETH 200-200-20 MG/5ML PO SUSP
15.0000 mL | Freq: Four times a day (QID) | ORAL | Status: DC | PRN
  Administered 2023-07-10: 15 mL via ORAL
  Filled 2023-07-10: qty 30

## 2023-07-10 NOTE — Plan of Care (Signed)

## 2023-07-10 NOTE — Progress Notes (Signed)
 Daily Progress Note   Patient Name: Andre Casey.       Date: 07/10/2023 DOB: February 13, 1930  Age: 89 y.o. MRN#: [id number] Attending Physician: Hoyt Macleod, MD Primary Care Physician: Glena Landau, MD Admit Date: 07/03/2023  Reason for Consultation/Follow-up: Pain control  Patient Profile/HPI:   89 y.o. male  with past medical history of prostate cancer (s/p prostatectomy, was on Eligard, moderate Alzheimer's dementia, HTN, HLD admitted on 07/03/2023 with back pain- being treated for UTI. Has been referred to hospice by attending team. Palliative consulted for pain management.   Subjective: Chart reviewed including labs, progress notes, imaging from this and previous encounters.  Medication use reviewed- has not required PRN medication in the last 24 hours. Reviewed SW note- plan for d/c home with 24 hr caregivers.  Discussed with Dr. Gwynneth Lessen- unfortunately insurance does not pay for SNF with hospice.   On eval, patient awake, and alert. Smiling. Enjoyed eating lunch. Reports pain is very well controlled and he is feeling much better.    Review of Systems  All other systems reviewed and are negative.    Physical Exam Vitals and nursing note reviewed.  Constitutional:      Comments: frail  Cardiovascular:     Rate and Rhythm: Normal rate.  Pulmonary:     Effort: Pulmonary effort is normal.  Musculoskeletal:     Comments: Generalized muscle wasting  Skin:    General: Skin is warm and dry.     Coloration: Skin is pale.  Neurological:     Mental Status: He is alert and oriented to person, place, and time.  Psychiatric:     Comments: pleasant             Vital Signs: BP (!) 151/71 (BP Location: Left Arm)   Pulse 74   Temp 98 F (36.7 C) (Oral)   Resp 18   Ht 5\' 9"   (1.753 m)   Wt 55.7 kg   SpO2 95%   BMI 18.13 kg/m  SpO2: SpO2: 95 % O2 Device: O2 Device: Room Air O2 Flow Rate:    Intake/output summary:  Intake/Output Summary (Last 24 hours) at 07/10/2023 1337 Last data filed at 07/10/2023 0700 Gross per 24 hour  Intake 240 ml  Output 900 ml  Net -660 ml   LBM: Last BM Date :  07/08/23 Baseline Weight: Weight: 55.7 kg Most recent weight: Weight: 55.7 kg       Palliative Assessment/Data: PPS: 50%      Patient Active Problem List   Diagnosis Date Noted   Pyelonephritis 07/03/2023   Enterococcus faecalis infection 05/15/2023   Enterococcal bacteremia 05/15/2023   Sepsis (HCC) 05/13/2023   Generalized weakness 01/23/2023   Inadequate community resources 10/17/2021   AKI (acute kidney injury) (HCC) 07/30/2021   Anemia of chronic disease 07/30/2021   Protein-calorie malnutrition, severe 07/06/2021   Right wrist pain 07/04/2021   UTI (urinary tract infection) 07/03/2021   Hypokalemia 07/03/2021   Palliative care encounter 06/18/2021   Physical deconditioning 04/28/2021   Bandemia 04/27/2021   Essential hypertension 04/26/2021   Infection of bladder catheter (HCC) 04/25/2021   Hyponatremia 04/25/2021   Normocytic anemia 04/25/2021   Pancreatic mass 04/25/2021   Lab test positive for detection of COVID-19 virus 04/15/2021   Prostate cancer metastatic to multiple sites (HCC) 04/15/2021   Chronic indwelling Foley catheter 04/15/2021   Moderate late onset Alzheimer's dementia (HCC) 04/08/2021   Gait abnormality 04/08/2021   Renal insufficiency 03/12/2021   Acute lower UTI 04/30/2020   Renal mass, right 12/04/2019   LBBB (left bundle branch block) 06/07/2019   Family history of Alzheimer's disease 03/25/2018   Atherosclerotic heart disease of native coronary artery with angina pectoris (HCC) 01/15/2016   CAD-S/P PCI/DES 01/15/2016   Family history of sudden cardiac death 01-30-16   Dyslipidemia, goal LDL below 70 01/08/2016    Paresthesia 07/18/2015   S/P shoulder replacement 04/20/2015   Malignant neoplasm of prostate (HCC) 02/09/2015   Urethral stricture 07/25/2013   History of prostate cancer 08/04/2011   ED (erectile dysfunction) of organic origin 02/03/2011   Male urinary stress incontinence 01/31/2011   External hemorrhoids 08/02/2007   Incontinence of feces 08/02/2007   History of colonic polyps 08/02/2007    Palliative Care Assessment & Plan    Assessment/Recommendations/Plan  Patient feels pain is well controlled with current interventions Plan for d/c with hospice- unfortunately, he does not have anyone to provide 24hr care at home- will defer disposition plan to hospice/TOC.    Code Status:   Code Status: Limited: Do not attempt resuscitation (DNR) -DNR-LIMITED -Do Not Intubate/DNI    Prognosis:  < 6 months  Discharge Planning: To Be Determined  Care plan was discussed with patient.   Thank you for allowing the Palliative Medicine Team to assist in the care of this patient.  Total time:  Prolonged billing:  Time includes:   Preparing to see the patient (e.g., review of tests) Obtaining and/or reviewing separately obtained history Performing a medically necessary appropriate examination and/or evaluation Counseling and educating the patient/family/caregiver Ordering medications, tests, or procedures Referring and communicating with other health care professionals (when not reported separately) Documenting clinical information in the electronic or other health record Independently interpreting results (not reported separately) and communicating results to the patient/family/caregiver Care coordination (not reported separately) Clinical documentation  Micki Alas, AGNP-C Palliative Medicine   Please contact Palliative Medicine Team phone at [phone] for questions and concerns.

## 2023-07-10 NOTE — Progress Notes (Signed)
 Maryan Smalling rm [address] [facility] Liaison RN note:  Received request from [facility] for hospice services at home after discharge. Chart and patient information under review by Hospice physician.   Spoke with patient in room and son by phone to initiate education related to hospice philosophy, services, and team approach to care. Patient/family verbalized understanding of information given.   Per discussion, the plan is for discharge home likely early next week.  DME needs discussed, there are no immediate DME needs.   Please send signed and completed DNR with patient/family. Please provide prescriptions at discharge as needed for ongoing symptom management.  [facility] information and contact numbers given to son Rebbeca Campi.   Above information shared with Ozie Bo.  Please call with any hospice related questions or concerns.  Thank you for the opportunity to participate in this patient's care.  Dwane Gitelman, Charity fundraiser, BSN [employer] [phone]

## 2023-07-10 NOTE — TOC Initial Note (Signed)
 Transition of Care (TOC) - Initial/Assessment Note    Patient Details  Name: Andre Casey. MRN: [id number] Date of Birth: [birth year]  Transition of Care [facility]) CM/SW Contact:    Loreda Rodriguez, RN Phone [phone]  07/10/2023, 11:07 AM  Clinical Narrative:                 TOC consulted for patient from home to return home with hospice versus SNF with hospice to follow. CM spoke with Ottie Blonder who confirms that patient currently has caregivers at home and the preference would be for patient to return home with 24/7 caregivers. Ottie Blonder states that she will need to contact [facility] which is the agency that currently provides caregivers. Family requesting a couple of days at least through the weekend to get caregivers in place. MD has been updated.   Cm offered choice for Hospice agency. Per Ottie Blonder choice is [facility]. Referral has been sent to [facility] liaison for [facility].     Barriers to Discharge: Other (must enter comment) (Family setting up caregivers)   Patient Goals and CMS Choice            Expected Discharge Plan and Services                                              Prior Living Arrangements/Services                       Activities of Daily Living   ADL Screening (condition at time of admission) Independently performs ADLs?: Yes (appropriate for developmental age) Is the patient deaf or have difficulty hearing?: Yes Does the patient have difficulty seeing, even when wearing glasses/contacts?: No Does the patient have difficulty concentrating, remembering, or making decisions?: No  Permission Sought/Granted                  Emotional Assessment              Admission diagnosis:  Pyelonephritis [N12] Flank pain [R10.9] Fever, unspecified fever cause [R50.9] Urinary tract infection with hematuria, site unspecified [N39.0, R31.9] Patient Active Problem List   Diagnosis Date Noted    Pyelonephritis 07/03/2023   Enterococcus faecalis infection 05/15/2023   Enterococcal bacteremia 05/15/2023   Sepsis (HCC) 05/13/2023   Generalized weakness 01/23/2023   Inadequate community resources 10/17/2021   AKI (acute kidney injury) (HCC) 07/30/2021   Anemia of chronic disease 07/30/2021   Protein-calorie malnutrition, severe 07/06/2021   Right wrist pain 07/04/2021   UTI (urinary tract infection) 07/03/2021   Hypokalemia 07/03/2021   Palliative care encounter 06/18/2021   Physical deconditioning 04/28/2021   Bandemia 04/27/2021   Essential hypertension 04/26/2021   Infection of bladder catheter (HCC) 04/25/2021   Hyponatremia 04/25/2021   Normocytic anemia 04/25/2021   Pancreatic mass 04/25/2021   Lab test positive for detection of COVID-19 virus 04/15/2021   Prostate cancer metastatic to multiple sites (HCC) 04/15/2021   Chronic indwelling Foley catheter 04/15/2021   Moderate late onset Alzheimer's dementia (HCC) 04/08/2021   Gait abnormality 04/08/2021   Renal insufficiency 03/12/2021   Acute lower UTI 04/30/2020   Renal mass, right 12/04/2019   LBBB (left bundle branch block) 06/07/2019   Family history of Alzheimer's disease 03/25/2018   Atherosclerotic heart disease of native coronary artery with angina pectoris (HCC) 01/15/2016   CAD-S/P PCI/DES 01/15/2016  Family history of sudden cardiac death 01/28/2016   Dyslipidemia, goal LDL below 70 01/08/2016   Paresthesia 07/18/2015   S/P shoulder replacement 04/20/2015   Malignant neoplasm of prostate (HCC) 02/09/2015   Urethral stricture 07/25/2013   History of prostate cancer 08/04/2011   ED (erectile dysfunction) of organic origin 02/03/2011   Male urinary stress incontinence 01/31/2011   External hemorrhoids 08/02/2007   Incontinence of feces 08/02/2007   History of colonic polyps 08/02/2007   PCP:  Glena Landau, MD Pharmacy:   [facility] [phone] - [place], [place] - [address] AT CORNER OF  [facility] [address] [place] [address] Phone: [phone] Fax: [phone]  Melode[facility] Sp[facility] - [facility] [address] [place] [address] Phone: [phone] Fax: [phone]     Social Drivers of Health (SDOH) Social History: SDOH Screenings   Food Insecurity: No Food Insecurity (07/04/2023)  Housing: Unknown (07/04/2023)  Transportation Needs: No Transportation Needs (07/04/2023)  Utilities: Not At Risk (07/04/2023)  Social Connections: Moderately Integrated (07/04/2023)  Recent Concern: Social Connections - Moderately Isolated (05/13/2023)  Tobacco Use: Medium Risk (07/05/2023)   SDOH Interventions:     Readmission Risk Interventions    07/06/2023    3:46 PM 05/15/2023    1:54 PM 05/14/2023    9:29 AM  Readmission Risk Prevention Plan  Transportation Screening Complete Complete Complete  PCP or Specialist Appt within 3-5 Days Complete Complete   HRI or Home Care Consult Complete Complete   Social Work Consult for Recovery Care Planning/Counseling Complete Complete   Palliative Care Screening Not Applicable Not Applicable   Medication Review Oceanographer) Complete Complete Complete  PCP or Specialist appointment within 3-5 days of discharge   Complete  HRI or Home Care Consult   Complete  SW Recovery Care/Counseling Consult   Complete  Palliative Care Screening   Not Applicable  Skilled Nursing Facility   Complete

## 2023-07-10 NOTE — Progress Notes (Signed)
 PROGRESS NOTE  Andre Casey.  DOB: [birth year]  PCP: Glena Landau, MD UEA:[id number]  DOA: 07/03/2023  LOS: 6 days  Hospital Day: 8  Brief narrative: Andre Priddy. is a 89 y.o. male with PMH significant for advanced prostate cancer s/p suprapubic catheter; dementia, HTN, HLD 5/9, patient presented with fever and back pain.  Urinalysis showed clear amber color urine with positive nitrite, rare bacteria CT scan showed evidence of left ascending pyelonephritis He was started on IV antibiotics  Admitted to hospitalist service. Urine culture on admission showed more than 100,000 CFU per mL of stenotrophomonas maltophilia as well as Enterococcus faecalis. 5/12, MRI thoracic and lumbar spine showed findings suggestive of metastatic cancer to multiple vertebras with likely renal primary based on the finding of 2.7 cm left renal mass in CT abdomen. After discussion of MRI findings with patient and family by previous hospitalist, family made a choice to stop further workup and transition to hospice care. Palliative care consulted  Subjective: Patient was seen and examined this morning. Lying on bed.  Not in distress. Family not at bedside.  Per case management, patient does not have insurance coverage for SNF.  Assessment and plan: Left pyelonephritis Suprapubic catheter status Present with fever, back pain CT scan suggestive of left ascending pyonephritis Urine culture on admission showed more than 100,000 CFU per mL of stenotrophomonas maltophilia as well as Enterococcus faecalis. 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 Possibility of new renal cancer with mets was discussed with patient and family by  previous hospitalist.  Family decided not at to pursue further workup and chose hospice care. Pain regimen --- Scheduled: MS Contin  15 mg twice daily, Tylenol  650 mg 3 times daily, lidocaine  patch, dexamethasone  2 mg daily, --- PRN: Morphine  10 mg oral every 2 hours.  Not been using at all.  I will DC IV morphine  order  Alzheimer's dementia Supportive care Continue Namenda  and primidone  Hypertension Continue amlodipine    CAD, HLD Continue aspirin ,  Severe malnutrition Dietitian consulted   Mobility: Encourage ambulation  Goals of care   Code Status: Limited: Do not attempt resuscitation (DNR) -DNR-LIMITED -Do Not Intubate/DNI      DVT prophylaxis:  enoxaparin  (LOVENOX ) injection 30 mg Start: 07/10/23 1045   Antimicrobials: Bactrim and amoxicillin  Fluid: None Consultants: Palliative care Family Communication: None at side  Status: Inpatient Level of care:  Med-Surg   Patient is from: Home Needs to continue in-hospital care: Per case manager, patient does not have insurance coverage for SNF.  Family is in preparation to take him home with hospice in 1 to 2 days.     Diet:  Diet Order             Diet regular Room service appropriate? Yes; Fluid consistency: Thin  Diet effective now                   Scheduled Meds:  acetaminophen   650 mg Oral TID   acidophilus  1 capsule Oral Q lunch   amLODipine   10 mg Oral QHS   amoxicillin   500 mg Oral Q8H   aspirin  EC  81 mg Oral QHS   dexamethasone   2 mg Oral Daily   enoxaparin  (LOVENOX ) injection  30 mg Subcutaneous Q24H   feeding supplement  237 mL Oral BID BM   lidocaine   1 patch Transdermal Q24H   memantine   10 mg Oral BID   mirtazapine   15 mg Oral QHS   morphine   15 mg Oral Q12H   multivitamin with minerals  1 tablet Oral Daily   pantoprazole  20 mg Oral Daily   senna  1 tablet Oral Daily    PRN meds: guaiFENesin-dextromethorphan, ipratropium-albuterol , metoprolol tartrate, ondansetron  **OR**  ondansetron  (ZOFRAN ) IV, polyethylene glycol   Infusions:    Antimicrobials: Anti-infectives (From admission, onward)    Start     Dose/Rate Route Frequency Ordered Stop   07/06/23 1000  sulfamethoxazole-trimethoprim (BACTRIM) 400-80 MG per tablet 1 tablet  Status:  Discontinued        1 tablet Oral Every 12 hours 07/06/23 0848 07/10/23 0754   07/06/23 1000  amoxicillin  (AMOXIL ) capsule 500 mg        500 mg Oral Every 8 hours 07/06/23 0848     07/04/23 1800  cefTRIAXone  (ROCEPHIN ) 1 g in sodium chloride  0.9 % 100 mL IVPB  Status:  Discontinued        1 g 200 mL/hr over 30 Minutes Intravenous Every 24 hours 07/03/23 2128 07/06/23 0848   07/03/23 1730  cefTRIAXone  (ROCEPHIN ) 1 g in sodium chloride  0.9 % 100 mL IVPB        1 g 200 mL/hr over 30 Minutes Intravenous  Once 07/03/23 1715 07/03/23 1822       Objective: Vitals:   07/10/23 0631 07/10/23 1309  BP: (!) 103/59 (!) 151/71  Pulse: 65 74  Resp: 20 18  Temp: 98 F (36.7 C) 98 F (36.7 C)  SpO2: 95% 95%    Intake/Output Summary (Last 24 hours) at 07/10/2023 1455 Last data filed at 07/10/2023 0700 Gross per 24 hour  Intake 240 ml  Output 900 ml  Net -660 ml   Filed Weights   07/04/23 0217  Weight: 55.7 kg   Weight change:  Body mass index is 18.13 kg/m.   Physical Exam: General exam: Pleasant, elderly Caucasian male.  Not in distress currently Skin: No rashes, lesions or ulcers. HEENT: Atraumatic, normocephalic, no obvious bleeding Lungs: Clear to auscultation bilaterally,  CVS: S1, S2, no murmur,   GI/Abd: Soft, nontender, nondistended, bowel sound present,   CNS: Alert, awake, able to have a short conversation Psychiatry: Cheerful Extremities: No pedal edema, no calf tenderness,   Data Review: I have personally reviewed the laboratory data and studies available.  F/u labs ordered Unresulted Labs (From admission, onward)     Start     Ordered   07/10/23 0500  Creatinine, serum  (enoxaparin  (LOVENOX )     CrCl >/= 30 ml/min)  Weekly,   R     Comments: while on enoxaparin  therapy    07/03/23 2128           Signed, Hoyt Macleod, MD [facility] 07/10/2023

## 2023-07-11 DIAGNOSIS — N12 Tubulo-interstitial nephritis, not specified as acute or chronic: Secondary | ICD-10-CM | POA: Diagnosis not present

## 2023-07-11 MED ORDER — IPRATROPIUM-ALBUTEROL 0.5-2.5 (3) MG/3ML IN SOLN
3.0000 mL | Freq: Two times a day (BID) | RESPIRATORY_TRACT | Status: DC
  Administered 2023-07-11 – 2023-07-13 (×4): 3 mL via RESPIRATORY_TRACT
  Filled 2023-07-11 (×4): qty 3

## 2023-07-11 NOTE — Plan of Care (Signed)

## 2023-07-11 NOTE — Progress Notes (Signed)
 PROGRESS NOTE  Andre Casey.  DOB: 07/14/1929  PCP: Glena Landau, MD EAV:[id number]  DOA: 07/03/2023  LOS: 7 days  Hospital Day: 9  Brief narrative: Andre Casey. is a 89 y.o. male with PMH significant for advanced prostate cancer s/p suprapubic catheter; dementia, HTN, HLD 5/9, patient presented with fever and back pain.  Urinalysis showed clear amber color urine with positive nitrite, rare bacteria CT scan showed evidence of left ascending pyelonephritis He was started on IV antibiotics  Admitted to hospitalist service. Urine culture on admission showed more than 100,000 CFU per mL of stenotrophomonas maltophilia as well as Enterococcus faecalis. 5/12, MRI thoracic and lumbar spine showed findings suggestive of metastatic cancer to multiple vertebras with likely renal primary based on the finding of 2.7 cm left renal mass in CT abdomen. After discussion of MRI findings with patient and family by previous hospitalist, family made a choice to stop further workup and transition to hospice care. Palliative care consulted  Subjective: Patient was seen and examined this morning. Lying down in bed.  Not in distress. Family preparing for discharge home with home hospice on Monday/Tuesday. Medically stable.  Assessment and plan: Left pyelonephritis Suprapubic catheter status Present with fever, back pain CT scan suggestive of left ascending pyonephritis Urine culture on admission showed more than 100,000 CFU per mL of stenotrophomonas maltophilia as well as Enterococcus faecalis. Completed a course of amoxicillin  and Bactrim  No results for input(s): "WBC", "LATICACIDVEN", "PROCALCITON" in the last 168 hours.  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 Possibility of new renal cancer with mets was discussed with patient and family by previous hospitalist.  Family decided not at to  pursue further workup and chose hospice care. Pain regimen --- Scheduled: MS Contin  15 mg twice daily, Tylenol  650 mg 3 times daily, lidocaine  patch, dexamethasone  2 mg daily,  Alzheimer's dementia Supportive care Continue Namenda  and primidone  Hypertension Continue amlodipine    CAD, HLD Continue aspirin ,  Severe malnutrition Dietitian consulted   Mobility: Encourage ambulation  Goals of care   Code Status: Limited: Do not attempt resuscitation (DNR) -DNR-LIMITED -Do Not Intubate/DNI     DVT prophylaxis:  enoxaparin  (LOVENOX ) injection 30 mg Start: 07/10/23 1045   Antimicrobials: Bactrim  and amoxicillin  Fluid: None Consultants: Palliative care Family Communication: None at side  Status: Inpatient Level of care:  Med-Surg   Patient is from: Home Needs to continue in-hospital care: Per case manager, patient does not have insurance coverage for SNF.  Family is in preparation to take him home with hospice in 1 to 2 days.     Diet:  Diet Order             Diet regular Room service appropriate? Yes; Fluid consistency: Thin  Diet effective now                   Scheduled Meds:  acetaminophen   650 mg Oral TID   acidophilus  1 capsule Oral Q lunch   amLODipine   10 mg Oral QHS   amoxicillin   500 mg Oral Q8H   aspirin  EC  81 mg Oral QHS   dexamethasone   2 mg Oral Daily   enoxaparin  (LOVENOX ) injection  30 mg Subcutaneous Q24H   feeding supplement  237 mL Oral BID BM   lidocaine   1 patch Transdermal Q24H   memantine   10 mg Oral BID   mirtazapine   15 mg Oral  QHS   morphine   15 mg Oral Q12H   multivitamin with minerals  1 tablet Oral Daily   pantoprazole   20 mg Oral Daily   senna  1 tablet Oral Daily    PRN meds: alum & mag hydroxide-simeth, guaiFENesin -dextromethorphan, ipratropium-albuterol , metoprolol  tartrate, ondansetron  **OR** ondansetron  (ZOFRAN ) IV, polyethylene glycol   Infusions:    Antimicrobials: Anti-infectives (From admission, onward)     Start     Dose/Rate Route Frequency Ordered Stop   07/06/23 1000  sulfamethoxazole -trimethoprim  (BACTRIM ) 400-80 MG per tablet 1 tablet  Status:  Discontinued        1 tablet Oral Every 12 hours 07/06/23 0848 07/10/23 0754   07/06/23 1000  amoxicillin  (AMOXIL ) capsule 500 mg        500 mg Oral Every 8 hours 07/06/23 0848     07/04/23 1800  cefTRIAXone  (ROCEPHIN ) 1 g in sodium chloride  0.9 % 100 mL IVPB  Status:  Discontinued        1 g 200 mL/hr over 30 Minutes Intravenous Every 24 hours 07/03/23 2128 07/06/23 0848   07/03/23 1730  cefTRIAXone  (ROCEPHIN ) 1 g in sodium chloride  0.9 % 100 mL IVPB        1 g 200 mL/hr over 30 Minutes Intravenous  Once 07/03/23 1715 07/03/23 1822       Objective: Vitals:   07/10/23 2157 07/11/23 0544  BP: (!) 153/62 122/66  Pulse:  63  Resp:  16  Temp:  98.4 F (36.9 C)  SpO2:  96%    Intake/Output Summary (Last 24 hours) at 07/11/2023 1154 Last data filed at 07/11/2023 0915 Gross per 24 hour  Intake 120 ml  Output 1100 ml  Net -980 ml   Filed Weights   07/04/23 0217  Weight: 55.7 kg   Weight change:  Body mass index is 18.13 kg/m.   Physical Exam: General exam: Pleasant, elderly Caucasian male.  Not in distress currently Skin: No rashes, lesions or ulcers. HEENT: Atraumatic, normocephalic, no obvious bleeding Lungs: Clear to auscultation bilaterally,  CVS: S1, S2, no murmur,   GI/Abd: Soft, nontender, nondistended, bowel sound present,   CNS: Alert, awake, able to have a short conversation Psychiatry: Cheerful Extremities: No pedal edema, no calf tenderness,   Data Review: I have personally reviewed the laboratory data and studies available.  F/u labs ordered Unresulted Labs (From admission, onward)     Start     Ordered   07/10/23 0500  Creatinine, serum  (enoxaparin  (LOVENOX )    CrCl >/= 30 ml/min)  Weekly,   R     Comments: while on enoxaparin  therapy    07/03/23 2128           Signed, Hoyt Macleod, MD [facility] 07/11/2023

## 2023-07-12 MED ORDER — OXYCODONE HCL 5 MG PO TABS: 5.0000 mg | ORAL_TABLET | Freq: Four times a day (QID) | ORAL | Status: DC | PRN

## 2023-07-12 NOTE — Progress Notes (Addendum)
 PROGRESS NOTE  Andre Casey.  DOB: 1929-12-02  PCP: Andre Landau, MD WUJ:[id number]  DOA: 07/03/2023  LOS: 8 days  Hospital Day: 10  Brief narrative: Andre Casey. is a 89 y.o. male with PMH significant for advanced prostate cancer s/p suprapubic catheter; dementia, HTN, HLD 5/9, patient presented with fever and back pain.  Urinalysis showed clear amber color urine with positive nitrite, rare bacteria CT scan showed evidence of left ascending pyelonephritis He was started on IV antibiotics  Admitted to hospitalist service. Urine culture on admission showed more than 100,000 CFU per mL of stenotrophomonas maltophilia as well as Enterococcus faecalis. 5/12, MRI thoracic and lumbar spine showed findings suggestive of metastatic cancer to multiple vertebras with likely renal primary based on the finding of 2.7 cm left renal mass in CT abdomen. After discussion of MRI findings with patient and family by previous hospitalist, family made a choice to stop further workup and transition to hospice care. Palliative care consulted  Subjective: Patient was seen and examined this morning. Lying down in bed. Not in distress.  Assessment and plan: Left pyelonephritis Suprapubic catheter status Present with fever, back pain CT scan suggestive of left ascending pyonephritis Urine culture on admission showed more than 100,000 CFU per mL of stenotrophomonas maltophilia as well as Enterococcus faecalis. 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 Possibility of new renal cancer with mets was discussed with patient and family by previous hospitalist.  Family decided not at to pursue further workup and chose hospice care. Pain regimen --- Scheduled: MS Contin  15 mg twice daily, Tylenol  650 mg 3 times daily, lidocaine  patch, dexamethasone  2 mg  daily,  Alzheimer's dementia Supportive care Continue Namenda  and primidone  Hypertension Continue amlodipine    CAD, HLD Continue aspirin ,  Severe malnutrition Dietitian consulted   Mobility: Encourage ambulation  Goals of care   Code Status: Limited: Do not attempt resuscitation (DNR) -DNR-LIMITED -Do Not Intubate/DNI     DVT prophylaxis:  enoxaparin  (LOVENOX ) injection 30 mg Start: 07/10/23 1045   Antimicrobials: Bactrim  and amoxicillin  Fluid: None Consultants: Palliative care Family Communication: None at side  Status: Inpatient Level of care:  Med-Surg   Patient is from: Home Needs to continue in-hospital care: Per case manager, patient does not have insurance coverage for SNF.  Family is in preparation to take him home with hospice tomorrow   Diet:  Diet Order             Diet regular Room service appropriate? Yes; Fluid consistency: Thin  Diet effective now                   Scheduled Meds:  acetaminophen   650 mg Oral TID   acidophilus  1 capsule Oral Q lunch   amLODipine   10 mg Oral QHS   amoxicillin   500 mg Oral Q8H   aspirin  EC  81 mg Oral QHS   dexamethasone   2 mg Oral Daily   enoxaparin  (LOVENOX ) injection  30 mg Subcutaneous Q24H   feeding supplement  237 mL Oral BID BM   ipratropium-albuterol   3 mL Nebulization BID   lidocaine   1 patch Transdermal Q24H   memantine   10 mg Oral BID   mirtazapine   15 mg Oral QHS   morphine   15 mg Oral Q12H   multivitamin with minerals  1 tablet Oral Daily   pantoprazole   20 mg Oral Daily   senna  1 tablet Oral Daily    PRN meds: alum & mag hydroxide-simeth, guaiFENesin -dextromethorphan, ipratropium-albuterol , metoprolol  tartrate, ondansetron  **OR** ondansetron  (ZOFRAN ) IV, polyethylene glycol   Infusions:    Antimicrobials: Anti-infectives (From admission, onward)    Start     Dose/Rate Route Frequency Ordered Stop   07/06/23 1000  sulfamethoxazole -trimethoprim  (BACTRIM ) 400-80 MG per tablet 1  tablet  Status:  Discontinued        1 tablet Oral Every 12 hours 07/06/23 0848 07/10/23 0754   07/06/23 1000  amoxicillin  (AMOXIL ) capsule 500 mg        500 mg Oral Every 8 hours 07/06/23 0848     07/04/23 1800  cefTRIAXone  (ROCEPHIN ) 1 g in sodium chloride  0.9 % 100 mL IVPB  Status:  Discontinued        1 g 200 mL/hr over 30 Minutes Intravenous Every 24 hours 07/03/23 2128 07/06/23 0848   07/03/23 1730  cefTRIAXone  (ROCEPHIN ) 1 g in sodium chloride  0.9 % 100 mL IVPB        1 g 200 mL/hr over 30 Minutes Intravenous  Once 07/03/23 1715 07/03/23 1822       Objective: Vitals:   07/12/23 0933 07/12/23 1304  BP:  117/78  Pulse:  66  Resp:  16  Temp:  98.5 F (36.9 C)  SpO2: 94% 94%    Intake/Output Summary (Last 24 hours) at 07/12/2023 1748 Last data filed at 07/12/2023 1304 Gross per 24 hour  Intake 360 ml  Output 850 ml  Net -490 ml   Filed Weights   07/04/23 0217  Weight: 55.7 kg   Weight change:  Body mass index is 18.13 kg/m.   Physical Exam: General exam: Pleasant, elderly Caucasian male.  Not in distress currently Skin: No rashes, lesions or ulcers. HEENT: Atraumatic, normocephalic, no obvious bleeding Lungs: Clear to auscultation bilaterally,  CVS: S1, S2, no murmur,   GI/Abd: Soft, nontender, nondistended, bowel sound present,   CNS: Alert, awake, able to have a short conversation Psychiatry: Cheerful Extremities: No pedal edema, no calf tenderness,   Data Review: I have personally reviewed the laboratory data and studies available.  F/u labs ordered Unresulted Labs (From admission, onward)     Start     Ordered   07/10/23 0500  Creatinine, serum  (enoxaparin  (LOVENOX )    CrCl >/= 30 ml/min)  Weekly,   R     Comments: while on enoxaparin  therapy    07/03/23 2128           Signed, Hoyt Macleod, MD [facility] 07/12/2023

## 2023-07-12 NOTE — Plan of Care (Signed)

## 2023-07-13 ENCOUNTER — Other Ambulatory Visit (HOSPITAL_COMMUNITY): Payer: Self-pay

## 2023-07-13 DIAGNOSIS — N12 Tubulo-interstitial nephritis, not specified as acute or chronic: Secondary | ICD-10-CM | POA: Diagnosis not present

## 2023-07-13 DIAGNOSIS — G8929 Other chronic pain: Secondary | ICD-10-CM

## 2023-07-13 MED ORDER — LIDOCAINE 4 % EX PTCH
1.0000 | MEDICATED_PATCH | Freq: Every day | CUTANEOUS | 0 refills | Status: AC | PRN
  Filled 2023-07-13: qty 12, 12d supply, fill #0

## 2023-07-13 MED ORDER — MORPHINE SULFATE ER 15 MG PO TBCR
15.0000 mg | EXTENDED_RELEASE_TABLET | Freq: Two times a day (BID) | ORAL | 0 refills | Status: AC
  Filled 2023-07-13 (×2): qty 10, 5d supply, fill #0

## 2023-07-13 MED ORDER — OXYCODONE HCL 5 MG PO TABS
5.0000 mg | ORAL_TABLET | Freq: Four times a day (QID) | ORAL | 0 refills | Status: AC | PRN
  Filled 2023-07-13: qty 20, 5d supply, fill #0

## 2023-07-13 MED ORDER — PANTOPRAZOLE SODIUM 20 MG PO TBEC
20.0000 mg | DELAYED_RELEASE_TABLET | Freq: Every day | ORAL | 0 refills | Status: AC
  Filled 2023-07-13: qty 90, 90d supply, fill #0

## 2023-07-13 MED ORDER — DEXAMETHASONE 2 MG PO TABS
2.0000 mg | ORAL_TABLET | Freq: Every day | ORAL | 0 refills | Status: AC
  Filled 2023-07-13: qty 90, 90d supply, fill #0

## 2023-07-13 MED ORDER — SENNA 8.6 MG PO TABS
1.0000 | ORAL_TABLET | Freq: Every day | ORAL | 0 refills | Status: AC
  Filled 2023-07-13: qty 120, 120d supply, fill #0

## 2023-07-13 MED ORDER — POLYETHYLENE GLYCOL 3350 17 GM/SCOOP PO POWD
17.0000 g | Freq: Every day | ORAL | 0 refills | Status: AC | PRN
  Filled 2023-07-13: qty 238, 14d supply, fill #0

## 2023-07-13 NOTE — Plan of Care (Signed)
 AVS printed, patient verbalized understanding. PIV removed, vitals stable. Patient leaving by private vehicle.   Problem: Education: Goal: Knowledge of General Education information will improve Description: Including pain rating scale, medication(s)/side effects and non-pharmacologic comfort measures Outcome: Adequate for Discharge   Problem: Health Behavior/Discharge Planning: Goal: Ability to manage health-related needs will improve Outcome: Adequate for Discharge   Problem: Clinical Measurements: Goal: Ability to maintain clinical measurements within normal limits will improve Outcome: Adequate for Discharge Goal: Will remain free from infection Outcome: Adequate for Discharge Goal: Diagnostic test results will improve Outcome: Adequate for Discharge Goal: Respiratory complications will improve Outcome: Adequate for Discharge Goal: Cardiovascular complication will be avoided Outcome: Adequate for Discharge   Problem: Activity: Goal: Risk for activity intolerance will decrease Outcome: Adequate for Discharge   Problem: Nutrition: Goal: Adequate nutrition will be maintained Outcome: Adequate for Discharge   Problem: Coping: Goal: Level of anxiety will decrease Outcome: Adequate for Discharge   Problem: Elimination: Goal: Will not experience complications related to bowel motility Outcome: Adequate for Discharge Goal: Will not experience complications related to urinary retention Outcome: Adequate for Discharge   Problem: Pain Managment: Goal: General experience of comfort will improve and/or be controlled Outcome: Adequate for Discharge   Problem: Safety: Goal: Ability to remain free from injury will improve Outcome: Adequate for Discharge   Problem: Skin Integrity: Goal: Risk for impaired skin integrity will decrease Outcome: Adequate for Discharge

## 2023-07-13 NOTE — Discharge Summary (Signed)
 Physician Discharge Summary  [name]. UJW:[id number] DOB: 06/18/1929 DOA: 07/03/2023  PCP: Glena Landau, MD  Admit date: 07/03/2023 Discharge date: 07/13/2023  Admitted From: Home Discharge disposition: Home with hospice  Recommendations at discharge:  Per hospice recommendation  Brief narrative: Andre Casey. is a 89 y.o. male with PMH significant for advanced prostate cancer s/p suprapubic catheter; dementia, HTN, HLD 5/9, patient presented with fever and back pain.  Urinalysis showed clear amber color urine with positive nitrite, rare bacteria CT scan showed evidence of left ascending pyelonephritis He was started on IV antibiotics  Admitted to hospitalist service. Urine culture on admission showed more than 100,000 CFU per mL of stenotrophomonas maltophilia as well as Enterococcus faecalis. 5/12, MRI thoracic and lumbar spine showed findings suggestive of metastatic cancer to multiple vertebras with likely renal primary based on the finding of 2.7 cm left renal mass in CT abdomen. After discussion of MRI findings with patient and family by previous hospitalist, family made a choice to stop further workup and transition to hospice care. Palliative care was consulted. His hospitalization was prolonged because of disposition difficulty.  Patient was planned for discharge to SNF with hospice but unable to find SNF without Medicaid.  Family made a decision to take him home with hospice and needed few days for arrangements. Last I discussed with family was yesterday 5/18.  They have completed arrangement for him to come home today 5/19.  Subjective: Patient was seen and examined this morning. Lying down in bed. Not in distress.  Hospital course: Left pyelonephritis Suprapubic catheter status Present with fever, back pain CT scan suggestive of left ascending pyonephritis Urine culture on admission showed more than 100,000 CFU per mL of stenotrophomonas  maltophilia as well as Enterococcus faecalis. 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 Possibility of new renal cancer with mets was discussed with patient and family by previous hospitalist.  Family decided not at to pursue further workup and chose hospice care. Pain regimen ---Scheduled: MS Contin  15 mg twice daily, oxycodone  5 mg every 6 hours, Tylenol  650 mg 3 times daily, lidocaine  patch, dexamethasone  2 mg daily  Alzheimer's dementia Supportive care Continue Namenda  and primidone  Hypertension Continue amlodipine    CAD, HLD Continue aspirin   Severe malnutrition Dietitian consulted   Mobility: Encourage ambulation  Goals of care   Code Status: Limited: Do not attempt resuscitation (DNR) -DNR-LIMITED -Do Not Intubate/DNI    Diet:  Diet Order             Diet general           Diet regular Room service appropriate? Yes; Fluid consistency: Thin  Diet effective now                   Nutritional status:  Body mass index is 18.13 kg/m.  Nutrition Problem: Increased nutrient needs Etiology: chronic illness Signs/Symptoms: estimated needs  Wounds:  - Wound / Incision (Open or Dehisced) 08/05/21 Irritant Dermatitis (Moisture Associated Skin Damage) Sacrum (Active)  Date First Assessed/Time First Assessed: 08/05/21 0947   Wound Type: Irritant Dermatitis (Moisture Associated Skin Damage)  Location: Sacrum    Assessments 08/04/2021  9:47 AM 08/07/2021 11:11 PM  Dressing Type Foam - Lift dressing to assess site every shift --  Dressing Status Clean, Dry, Intact --  Dressing Change Frequency Every 3 days --  Site /  Wound Assessment Clean;Dry;Pink --  Peri-wound Assessment Intact;Erythema (blanchable) --  Treatment -- Cleansed     No associated orders.     Wound / Incision (Open or Dehisced) 07/05/23 Skin tear Head Left (Active)  Date  First Assessed/Time First Assessed: 07/05/23 1124   Wound Type: Skin tear  Location: Head  Location Orientation: Left  Present on Admission: Yes    Assessments 07/05/2023  8:10 AM 07/13/2023  8:47 AM  Dressing Type Foam - Lift dressing to assess site every shift None  Dressing Changed New --  Dressing Status Clean, Dry, Intact --  Dressing Change Frequency PRN --  Site / Wound Assessment -- Clean;Dry  Margins Unattached edges (unapproximated) --  Closure None --  Drainage Amount None --  Drainage Description Serosanguineous --     No associated orders.    Discharge Exam:   Vitals:   07/12/23 2023 07/12/23 2117 07/13/23 0500 07/13/23 1221  BP:  126/63 122/72 130/66  Pulse:  71 62 73  Resp:  16 16 16   Temp:  97.9 F (36.6 C) 98.3 F (36.8 C) 98.4 F (36.9 C)  TempSrc:  Oral Oral Oral  SpO2: 95% 96% 95% 95%  Weight:      Height:        Body mass index is 18.13 kg/m.  General exam: Pleasant, elderly Caucasian male.  Not in distress currently. Skin: No rashes, lesions or ulcers. HEENT: Atraumatic, normocephalic, no obvious bleeding Lungs: Clear to auscultation bilaterally,  CVS: S1, S2, no murmur GI/Abd: Soft, nontender, nondistended, bowel sound present,   CNS: Alert, awake, able to have a short conversation Psychiatry: Pleasant, cheerful old man Extremities: No pedal edema, no calf tenderness  Follow ups:    Follow-up Information     Glena Landau, MD Follow up.   Specialty: Family Medicine Contact information: [address] [place] [address] [phone]                 Discharge Instructions:   Discharge Instructions     Activity as tolerated - No restrictions   Complete by: As directed    Call MD for:   Complete by: As directed    Please get in touch with hospice MD/nurse for any symptom control.   Diet general   Complete by: As directed    If patient is alert and awake enough to eat, can allow luxury feeding.    Discharge instructions   Complete by: As directed    Medicines intended for comfort and pain management prescribed. Rest of the care per hospice policy.   No wound care   Complete by: As directed        Discharge Medications:   Allergies as of 07/13/2023       Reactions   Sertraline Diarrhea   Simvastatin Diarrhea   Aricept  [donepezil ] Nausea Only   Report upset stomach - unable to tolerate.   Namzaric  [memantine  Hcl-donepezil  Hcl] Other (See Comments)   Stomach upset  Pt able to take Memantine  by itself.   Wellbutrin  [bupropion ] Anxiety        Medication List     STOP taking these medications    ampicillin  500 MG capsule Commonly known as: PRINCIPEN   traMADol  50 MG tablet Commonly known as: ULTRAM        TAKE these medications    acetaminophen  650 MG CR tablet Commonly known as: TYLENOL  Take 650 mg by mouth in the morning, at noon, and at bedtime.   amLODipine   10 MG tablet Commonly known as: NORVASC  Take 10 mg by mouth daily.   ascorbic acid 500 MG tablet Commonly known as: VITAMIN C Take 500 mg by mouth daily.   aspirin  EC 81 MG tablet Take 1 tablet (81 mg total) by mouth daily. Swallow whole.   CYSTEX PO Take 1 tablet by mouth in the morning and at bedtime.   dexamethasone  2 MG tablet Commonly known as: DECADRON  Take 1 tablet (2 mg total) by mouth daily. Start taking on: Jul 14, 2023   feeding supplement Liqd Take 237 mLs by mouth 2 (two) times daily between meals. What changed: when to take this   ipratropium-albuterol  0.5-2.5 (3) MG/3ML Soln Commonly known as: DUONEB Take 3 mLs by nebulization in the morning, at noon, and at bedtime.   lidocaine  4 % Place 1 patch onto the skin daily as needed (pain).   memantine  10 MG tablet Commonly known as: Namenda  Take 1 tablet (10 mg total) by mouth 2 (two) times daily.   mirtazapine  15 MG tablet Commonly known as: REMERON  Take 15 mg by mouth daily in the afternoon.   morphine  15 MG 12 hr  tablet Commonly known as: MS CONTIN  Take 1 tablet (15 mg total) by mouth every 12 (twelve) hours for 5 days.   multivitamin with minerals Tabs tablet Take 1 tablet by mouth daily.   oxyCODONE  5 MG immediate release tablet Commonly known as: Oxy IR/ROXICODONE  Take 1 tablet (5 mg total) by mouth every 6 (six) hours as needed for up to 5 days for moderate pain (pain score 4-6).   pantoprazole  20 MG tablet Commonly known as: PROTONIX  Take 1 tablet (20 mg total) by mouth daily. Start taking on: Jul 14, 2023   polyethylene glycol 17 g packet Commonly known as: MIRALAX  / GLYCOLAX  Take 17 g by mouth daily as needed for mild constipation.   PROBIOTIC PO Take 1 tablet by mouth daily with lunch.   senna 8.6 MG Tabs tablet Commonly known as: SENOKOT Take 1 tablet (8.6 mg total) by mouth daily. Start taking on: Jul 14, 2023         The results of significant diagnostics from this hospitalization (including imaging, microbiology, ancillary and laboratory) are listed below for reference.    Procedures and Diagnostic Studies:   CT Renal Stone Study Result Date: 07/03/2023 CLINICAL DATA:  Recurrent urinary tract infection EXAM: CT ABDOMEN AND PELVIS WITHOUT CONTRAST TECHNIQUE: Multidetector CT imaging of the abdomen and pelvis was performed following the standard protocol without IV contrast. RADIATION DOSE REDUCTION: This exam was performed according to the departmental dose-optimization program which includes automated exposure control, adjustment of the mA and/or kV according to patient size and/or use of iterative reconstruction technique. COMPARISON:  CT abdomen and pelvis dated 01/25/2023 FINDINGS: Lower chest: Severe emphysematous changes of the partially imaged lung. No pleural effusion or pneumothorax demonstrated. Partially imaged heart size is normal. Hepatobiliary: No focal hepatic lesions. No intra or extrahepatic biliary ductal dilation. Normal gallbladder. 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 No main pancreatic ductal dilation by noncontrast technique. Spleen: Normal in size without focal abnormality. Adrenals/Urinary Tract: No adrenal nodules. Mildly hyperattenuating lesion along the lateral interpolar right kidney measures 2.7 cm (2:26), previously suspicious for a renal cell carcinoma. Slightly increased left hydroureteronephrosis. The distal left ureter is obscured by artifact from bilateral pelvic surgical clips. Urinary bladder  is decompressed with suprapubic catheter in-situ. Stomach/Bowel: Small hiatal hernia. Normal appearance of the stomach. Moderate duodenal diverticulum arising from the distal duodenum. No evidence of bowel wall thickening, distention, or inflammatory changes. Colonic diverticulosis without acute diverticulitis. Appendix is not discretely seen. Vascular/Lymphatic: Aortic atherosclerosis. No enlarged abdominal or pelvic lymph nodes. Reproductive: Prostatectomy. Other: No free fluid, fluid collection, or free air. Musculoskeletal: No acute or abnormal lytic or blastic osseous lesions. Multilevel degenerative changes of the partially imaged thoracic and lumbar spine. IMPRESSION: 1. Slightly increased left hydroureteronephrosis, which may be secondary to ascending urinary tract infection. The distal left ureter is obscured by artifact from the pelvic surgical clips. 2. Urinary bladder is decompressed with suprapubic catheter in-situ. 3. Mildly hyperattenuating lesion along the lateral interpolar right kidney measures 2.7 cm, previously suspicious for a renal cell carcinoma. 4.  Aortic Atherosclerosis (ICD10-I70.0). Electronically Signed   By: Limin  Xu M.D.   On: 07/03/2023 20:13   DG Chest Port 1 View Result Date: 07/03/2023 CLINICAL DATA:  Fever EXAM: PORTABLE CHEST 1 VIEW  COMPARISON:  X-ray 05/13/2023 and older FINDINGS: There is some linear opacity left lung base likely scar or atelectasis. No consolidation, pneumothorax or effusion. Normal cardiopericardial silhouette. Calcified aorta. Osteopenia. Degenerative changes of the spine and shoulders. Right shoulder reverse arthroplasty seen at the edge of the imaging field. There is an elevated left humeral head. Please correlate for rotator cuff tear. IMPRESSION: Left basilar scar or atelectasis.  No consolidation or effusion. Electronically Signed   By: Adrianna Horde M.D.   On: 07/03/2023 18:28     Labs:   Basic Metabolic Panel: Recent Labs  Lab 07/10/23 0522  CREATININE 1.58*   GFR Estimated Creatinine Clearance: 24 mL/min (A) (by C-G formula based on SCr of 1.58 mg/dL (H)). Liver Function Tests: No results for input(s): "AST", "ALT", "ALKPHOS", "BILITOT", "PROT", "ALBUMIN" in the last 168 hours. No results for input(s): "LIPASE", "AMYLASE" in the last 168 hours. No results for input(s): "AMMONIA" in the last 168 hours. Coagulation profile No results for input(s): "INR", "PROTIME" in the last 168 hours.  CBC: No results for input(s): "WBC", "NEUTROABS", "HGB", "HCT", "MCV", "PLT" in the last 168 hours. Cardiac Enzymes: No results for input(s): "CKTOTAL", "CKMB", "CKMBINDEX", "TROPONINI" in the last 168 hours. BNP: Invalid input(s): "POCBNP" CBG: No results for input(s): "GLUCAP" in the last 168 hours. D-Dimer No results for input(s): "DDIMER" in the last 72 hours. Hgb A1c No results for input(s): "HGBA1C" in the last 72 hours. Lipid Profile No results for input(s): "CHOL", "HDL", "LDLCALC", "TRIG", "CHOLHDL", "LDLDIRECT" in the last 72 hours. Thyroid  function studies No results for input(s): "TSH", "T4TOTAL", "T3FREE", "THYROIDAB" in the last 72 hours.  Invalid input(s): "FREET3" Anemia work up No results for input(s): "VITAMINB12", "FOLATE", "FERRITIN", "TIBC", "IRON", "RETICCTPCT" in the last  72 hours. Microbiology Recent Results (from the past 240 hours)  Urine Culture     Status: Abnormal   Collection Time: 07/03/23  4:29 PM   Specimen: Urine, Random  Result Value Ref Range Status   Specimen Description   Final    URINE, RANDOM Performed at [facility], [address] [place], [place] [address]    Special Requests   Final    NONE Reflexed from [id number] Performed at [facility], [address] [place], [place] [address]    Culture (A)  Final    50,000 COLONIES/mL ENTEROCOCCUS FAECALIS >=100,000 COLONIES/mL STENOTROPHOMONAS MALTOPHILIA    Report Status 07/06/2023 FINAL  Final  Organism ID, Bacteria ENTEROCOCCUS FAECALIS (A)  Final   Organism ID, Bacteria STENOTROPHOMONAS MALTOPHILIA (A)  Final      Susceptibility   Enterococcus faecalis - MIC*    AMPICILLIN  <=2 SENSITIVE Sensitive     NITROFURANTOIN <=16 SENSITIVE Sensitive     VANCOMYCIN  1 SENSITIVE Sensitive     * 50,000 COLONIES/mL ENTEROCOCCUS FAECALIS   Stenotrophomonas maltophilia - MIC*    LEVOFLOXACIN 1 SENSITIVE Sensitive     TRIMETH /SULFA  <=20 SENSITIVE Sensitive     * >=100,000 COLONIES/mL STENOTROPHOMONAS MALTOPHILIA  Culture, blood (Routine X 2) w Reflex to ID Panel     Status: None   Collection Time: 07/03/23  5:55 PM   Specimen: BLOOD  Result Value Ref Range Status   Specimen Description   Final    BLOOD Performed at [facility], [address], [place], [place] [address]    Special Requests   Final    BOTTLES DRAWN AEROBIC AND ANAEROBIC Blood Culture adequate volume Performed at [facility], [address], [place], [place] [address]    Culture   Final    NO GROWTH 5 DAYS Performed at [facility], [address] [place], [place] [address]    Report Status 07/08/2023 FINAL  Final  Culture, blood (Routine X 2) w Reflex to ID Panel     Status: None   Collection Time: 07/03/23  5:59 PM   Specimen: Right  Antecubital; Blood  Result Value Ref Range Status   Specimen Description   Final    RIGHT ANTECUBITAL Performed at [facility], [address] [place], [place] [address]    Special Requests   Final    BOTTLES DRAWN AEROBIC AND ANAEROBIC Blood Culture results may not be optimal due to an inadequate volume of blood received in culture bottles Performed at [facility], [address] [place], [place] [address]    Culture   Final    NO GROWTH 5 DAYS Performed at [facility], [address] [place], [place] [address]    Report Status 07/08/2023 FINAL  Final   Time coordinating discharge: 45 minutes  Signed: Tallen Schnorr  [facility] 07/13/2023, 1:35 PM

## 2023-07-13 NOTE — Progress Notes (Signed)
 TOC meds in a secure bag delivered to pt in room- pt resting -placed w/ pt 's belongings. Note left on pharmacy attachment for family/care giver to call [facility] at [phone] if there is additional prescription health insurance - none on my file.

## 2023-07-13 NOTE — Progress Notes (Signed)
   Palliative Medicine Inpatient Follow Up Note HPI: 89 y.o. male  with past medical history of prostate cancer (s/p prostatectomy, was on Eligard, moderate Alzheimer's dementia, HTN, HLD admitted on 07/03/2023 with back pain- being treated for UTI. Has been referred to hospice by attending team. Palliative consulted for pain management.    Today's Discussion 07/13/2023  *Please note that this is a verbal dictation therefore any spelling or grammatical errors are due to the "Dragon Medical One" system interpretation.  Chart reviewed inclusive of vital signs, progress notes, laboratory results, and diagnostic images.   I met with Andre Casey at bedside this morning. He shares with me that he is experiencing some back pain - though likely positional. He shared he could re-adjust himself. He denies nausea or shortness of breath this morning.   Created space and opportunity for patient to explore thoughts feelings and fears regarding current medical situation.He is understanding in regards to his poor health. He plans to transition home with hospice support. We reviewed the ongoing need for caregiver support and he notes family is helping to coordinate this.  I have spoken with TOC - Debi Fall, RN and [employer]. They are in the process of supporting what additional DME is needed in the home. Once CG are arranged and DME delivered the plan will be for transition home.   Questions and concerns addressed/Palliative Support Provided.   Objective Assessment: Vital Signs Vitals:   07/12/23 2117 07/13/23 0500  BP: 126/63 122/72  Pulse: 71 62  Resp: 16 16  Temp: 97.9 F (36.6 C) 98.3 F (36.8 C)  SpO2: 96% 95%    Intake/Output Summary (Last 24 hours) at 07/13/2023 1054 Last data filed at 07/13/2023 0847 Gross per 24 hour  Intake 240 ml  Output 1175 ml  Net -935 ml   Last Weight  Most recent update: 07/04/2023  2:43 AM    Weight  55.7 kg (122 lb 12.7 oz)            Gen:   Frail elderly Caucasian M chronically ill appearing HEENT: moist mucous membranes CV: Regular rate and rhythm  PULM: On RA, breathing is even and nonlabored ABD: soft/nontender  EXT: Muscle wasting Neuro: Alert and oriented   SUMMARY OF RECOMMENDATIONS   DNAR/DNI  Plan to transition home with [facility] hospice once CG and DME are annaged  The PMT will remain involved as needed  Symptoms: Pain Lower Back: Continue tylenol  650mg  PO TID Continue lidoderm  patch Continue MC Contin 15mg  PO Q12H Continue oxycodone  5mg  PO Q6H PRN ______________________________________________________________________________________ [facility] Palliative Medicine Team Team Cell Phone: [phone] Please utilize secure chat with additional questions, if there is no response within 30 minutes please call the above phone number   Time Spent: 35  Palliative Medicine Team providers are available by phone from 7am to 7pm daily and can be reached through the team cell phone.  Should this patient require assistance outside of these hours, please call the patient's attending physician.

## 2023-07-13 NOTE — TOC Transition Note (Signed)
 Transition of Care [facility]) - Discharge Note   Patient Details  Name: Andre Casey. MRN: [id number] Date of Birth: April 22, 1929  [facility] CM/SW Contact:  Loreda Rodriguez, RN Phone Number:[phone]  07/13/2023, 2:21 PM   Clinical Narrative:    Patient with discharge orders.Hospice following for home with plan for home caregivers. Currently there are no TOC needs.    Final next level of care: Home w Hospice Care (additional 24/7 caregiver support) Barriers to Discharge: No Barriers Identified   Patient Goals and CMS Choice            Discharge Placement                       Discharge Plan and Services Additional resources added to the After Visit Summary for                  DME Arranged: N/A DME Agency: NA       HH Arranged: NA HH Agency: NA        Social Drivers of Health (SDOH) Interventions SDOH Screenings   Food Insecurity: No Food Insecurity (07/04/2023)  Housing: Unknown (07/04/2023)  Transportation Needs: No Transportation Needs (07/04/2023)  Utilities: Not At Risk (07/04/2023)  Social Connections: Moderately Integrated (07/04/2023)  Recent Concern: Social Connections - Moderately Isolated (05/13/2023)  Tobacco Use: Medium Risk (07/05/2023)     Readmission Risk Interventions    07/06/2023    3:46 PM 05/15/2023    1:54 PM 05/14/2023    9:29 AM  Readmission Risk Prevention Plan  Transportation Screening Complete Complete Complete  PCP or Specialist Appt within 3-5 Days Complete Complete   HRI or Home Care Consult Complete Complete   Social Work Consult for Recovery Care Planning/Counseling Complete Complete   Palliative Care Screening Not Applicable Not Applicable   Medication Review Oceanographer) Complete Complete Complete  PCP or Specialist appointment within 3-5 days of discharge   Complete  HRI or Home Care Consult   Complete  SW Recovery Care/Counseling Consult   Complete  Palliative Care Screening   Not  Applicable  Skilled Nursing Facility   Complete

## 2023-08-16 ENCOUNTER — Encounter (HOSPITAL_COMMUNITY): Payer: Self-pay | Admitting: Emergency Medicine

## 2023-08-16 ENCOUNTER — Emergency Department (HOSPITAL_COMMUNITY)

## 2023-08-16 ENCOUNTER — Emergency Department (HOSPITAL_COMMUNITY)
Admission: EM | Admit: 2023-08-16 | Discharge: 2023-08-16 | Disposition: A | Discharge status: Home / Self Care | Attending: Emergency Medicine | Admitting: Emergency Medicine

## 2023-08-16 DIAGNOSIS — I10 Essential (primary) hypertension: Secondary | ICD-10-CM | POA: Insufficient documentation

## 2023-08-16 DIAGNOSIS — R1031 Right lower quadrant pain: Secondary | ICD-10-CM | POA: Insufficient documentation

## 2023-08-16 DIAGNOSIS — W19XXXA Unspecified fall, initial encounter: Secondary | ICD-10-CM | POA: Diagnosis not present

## 2023-08-16 DIAGNOSIS — M25551 Pain in right hip: Secondary | ICD-10-CM | POA: Diagnosis present

## 2023-08-16 DIAGNOSIS — Z85828 Personal history of other malignant neoplasm of skin: Secondary | ICD-10-CM | POA: Diagnosis not present

## 2023-08-16 DIAGNOSIS — I2510 Atherosclerotic heart disease of native coronary artery without angina pectoris: Secondary | ICD-10-CM | POA: Insufficient documentation

## 2023-08-16 DIAGNOSIS — Z79899 Other long term (current) drug therapy: Secondary | ICD-10-CM | POA: Insufficient documentation

## 2023-08-16 DIAGNOSIS — Z8546 Personal history of malignant neoplasm of prostate: Secondary | ICD-10-CM | POA: Insufficient documentation

## 2023-08-16 DIAGNOSIS — Z7982 Long term (current) use of aspirin: Secondary | ICD-10-CM | POA: Insufficient documentation

## 2023-08-16 DIAGNOSIS — J45909 Unspecified asthma, uncomplicated: Secondary | ICD-10-CM | POA: Diagnosis not present

## 2023-08-16 DIAGNOSIS — F0390 Unspecified dementia without behavioral disturbance: Secondary | ICD-10-CM | POA: Insufficient documentation

## 2023-08-16 NOTE — ED Triage Notes (Signed)
 Patient bib [facility] post fall hospice was at home during incident. Right hip pain. Ambulation after fall.
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
## 2023-08-16 NOTE — ED Notes (Signed)
 Patient cleaned up and ambulated with a wheelchair

## 2023-08-16 NOTE — ED Notes (Signed)
 Called granddaughter she will come pick patient back up shortly.

## 2023-10-16 ENCOUNTER — Other Ambulatory Visit (HOSPITAL_COMMUNITY): Payer: Self-pay

## 2023-10-26 DEATH — deceased
